# Patient Record
Sex: Male | Born: 1935 | Race: White | Hispanic: No | Smoking: Former smoker
Health system: Southern US, Community
[De-identification: ages and names within clinical notes are randomized; demographics above are authoritative.]

## PROBLEM LIST (undated history)

## (undated) DIAGNOSIS — C6290 Malignant neoplasm of unspecified testis, unspecified whether descended or undescended: Secondary | ICD-10-CM

## (undated) DIAGNOSIS — G039 Meningitis, unspecified: Secondary | ICD-10-CM

## (undated) DIAGNOSIS — J449 Chronic obstructive pulmonary disease, unspecified: Secondary | ICD-10-CM

## (undated) DIAGNOSIS — I6350 Cerebral infarction due to unspecified occlusion or stenosis of unspecified cerebral artery: Secondary | ICD-10-CM

## (undated) DIAGNOSIS — I10 Essential (primary) hypertension: Secondary | ICD-10-CM

## (undated) DIAGNOSIS — E785 Hyperlipidemia, unspecified: Secondary | ICD-10-CM

## (undated) DIAGNOSIS — J45909 Unspecified asthma, uncomplicated: Secondary | ICD-10-CM

## (undated) DIAGNOSIS — N319 Neuromuscular dysfunction of bladder, unspecified: Secondary | ICD-10-CM

## (undated) DIAGNOSIS — M1990 Unspecified osteoarthritis, unspecified site: Secondary | ICD-10-CM

## (undated) DIAGNOSIS — R200 Anesthesia of skin: Secondary | ICD-10-CM

## (undated) DIAGNOSIS — H9190 Unspecified hearing loss, unspecified ear: Secondary | ICD-10-CM

## (undated) DIAGNOSIS — L309 Dermatitis, unspecified: Secondary | ICD-10-CM

## (undated) DIAGNOSIS — M75100 Unspecified rotator cuff tear or rupture of unspecified shoulder, not specified as traumatic: Secondary | ICD-10-CM

## (undated) DIAGNOSIS — H547 Unspecified visual loss: Secondary | ICD-10-CM

## (undated) DIAGNOSIS — E7800 Pure hypercholesterolemia, unspecified: Secondary | ICD-10-CM

## (undated) DIAGNOSIS — F419 Anxiety disorder, unspecified: Secondary | ICD-10-CM

## (undated) DIAGNOSIS — R262 Difficulty in walking, not elsewhere classified: Secondary | ICD-10-CM

## (undated) DIAGNOSIS — C4490 Unspecified malignant neoplasm of skin, unspecified: Secondary | ICD-10-CM

## (undated) DIAGNOSIS — I639 Cerebral infarction, unspecified: Secondary | ICD-10-CM

## (undated) DIAGNOSIS — R2689 Other abnormalities of gait and mobility: Secondary | ICD-10-CM

## (undated) DIAGNOSIS — N400 Enlarged prostate without lower urinary tract symptoms: Secondary | ICD-10-CM

## (undated) DIAGNOSIS — C801 Malignant (primary) neoplasm, unspecified: Secondary | ICD-10-CM

## (undated) HISTORY — DX: Unspecified asthma, uncomplicated: J45.909

## (undated) HISTORY — DX: Malignant neoplasm of unspecified testis, unspecified whether descended or undescended: C62.90

## (undated) HISTORY — DX: Meningitis, unspecified: G03.9

## (undated) HISTORY — PX: OTHER SURGICAL HISTORY: SHX169

## (undated) HISTORY — PX: PROSTATE SURGERY: SHX751

## (undated) HISTORY — DX: Chronic obstructive pulmonary disease, unspecified: J44.9

## (undated) HISTORY — PX: TONSILLECTOMY AND ADENOIDECTOMY: SHX28

## (undated) HISTORY — PX: APPENDECTOMY: SHX54

## (undated) HISTORY — DX: Essential (primary) hypertension: I10

## (undated) HISTORY — DX: Hyperlipidemia, unspecified: E78.5

## (undated) HISTORY — PX: TESTICLE REMOVAL: SHX68

## (undated) HISTORY — DX: Cerebral infarction due to unspecified occlusion or stenosis of unspecified cerebral artery: I63.50

## (undated) HISTORY — PX: COCHLEAR IMPLANT: SHX184

## (undated) HISTORY — PX: EYE SURGERY: SHX253

## (undated) HISTORY — DX: Unspecified hearing loss, unspecified ear: H91.90

## (undated) HISTORY — PX: TONSILLECTOMY: SUR1361

## (undated) HISTORY — DX: Malignant (primary) neoplasm, unspecified: C80.1

## (undated) HISTORY — DX: Pure hypercholesterolemia, unspecified: E78.00

## (undated) HISTORY — PX: COCHLEAR IMPLANT: SUR684

## (undated) HISTORY — DX: Benign prostatic hyperplasia without lower urinary tract symptoms: N40.0

## (undated) HISTORY — DX: Dermatitis, unspecified: L30.9

## (undated) HISTORY — DX: Unspecified malignant neoplasm of skin, unspecified: C44.90

## (undated) HISTORY — DX: Difficulty in walking, not elsewhere classified: R26.2

## (undated) HISTORY — DX: Unspecified visual loss: H54.7

## (undated) HISTORY — DX: Other abnormalities of gait and mobility: R26.89

## (undated) HISTORY — DX: Unspecified rotator cuff tear or rupture of unspecified shoulder, not specified as traumatic: M75.100

---
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
## 2009-03-26 ENCOUNTER — Emergency Department (HOSPITAL_COMMUNITY): Admission: EM | Admit: 2009-03-26 | Discharge: 2009-03-26 | Payer: Self-pay | Admitting: Emergency Medicine

## 2009-10-24 ENCOUNTER — Ambulatory Visit
Admit: 2009-10-24 | Discharge: 2009-10-24 | Disposition: A | Discharge status: Home / Self Care | Payer: Self-pay | Source: Ambulatory Visit | Attending: Urology | Admitting: Urology

## 2009-10-24 LAB — PSA (EFF.4-2010): PSA (eff. 4-2010): 3.01 ng/mL (ref 0.00–4.00)

## 2009-11-12 ENCOUNTER — Encounter: Payer: Self-pay | Admitting: Gastroenterology

## 2009-11-12 ENCOUNTER — Ambulatory Visit: Payer: Self-pay | Admitting: Urology

## 2009-11-12 NOTE — Progress Notes (Signed)
 HPI     This patient was seen today in followup for history of voiding   dysfunction.  He has a history of prostatic hyperplasia with lower urinary   tract symptoms.  He is taking Avodart.  He finds is to continue to be very   helpful and has some modest nocturia.  He denies infections, hematuria,   retention or dysuria.     His interval medical history shows no significant new problems.  He didn't   lose his wife who passed away since last seen but he has social support.     System review negative for chest pain shortness of breath, abdominal pain,   joint swelling.     On exam today he is in no acute distress with vital signs as indicated.  No   wheezing is noted and he has no CVA tenderness.  The abdomen is soft   without evidence of mass or bladder distention.  No hernias present.  Penis   reveals no plaque or discharge.   Rectal exam reveals normal tone and a 2+   benign prostate.     Dipstick urinalysis today normal.           Impression/plan: Continues to do well on Avodart, PSA stable no need for   additional biopsy. I  will renew medication and see back in one year.  Allergies   No Known Drug Allergy.  Current Meds   Benicar HCT 40-25 MG Tablet;; RPT  Serevent Diskus 50 MCG/DOSE Aerosol Powder Breath Activated;; RPT  Lipitor 40 MG Tablet;; RPT  Azithromycin 250 MG Tablet;; RPT  Avodart 0.5 MG Capsule;TAKE 1 CAPSULE DAILY; Rx.  Active Problems   Benign Prostatic Hyperplasia (600.00).  Vital Signs   Recorded by COSIER,BERNADINE on 12 Nov 2009 09:21 AM  BP:138/72,  LUE,  Sitting,   HR: 80 b/min,   Temp: 97.7 F,  Tympanic,   Pain Scale: 0.  Results   U/A DIP (CHEMSTRIP 10 + SSA)   12 Nov 2009 08:27 AM  -   PH,UR: 5   -   LEUK ESTERASE,UR: -  -   NITRITES,UR: -  -   PROTEIN,UR: trace  -   GLUCOSE,UR: -  -   Alphonsus Sias: -  -   BLOOD,UR: -.  Signature   Electronically signed by: Webb Laws  M.D.; 11/12/2009 12:37 PM EST.

## 2010-02-11 NOTE — Miscellaneous (Unsigned)
 Continuity of Care Record  Created: todo  From: Laurette Schimke  From:   From: [employer], EHR v10.2.7.53  To: Foster Simpson  Purpose: Patient Use;       Problems  Diagnosis: Benign Prostatic Hyperplasia (600.00)     Alerts  Allergy - No Known Drug Allergy     Medications  Avodart 0.5 MG Capsule; TAKE 1 CAPSULE DAILY ; Rx   Azithromycin 250 MG Tablet ; RPT   Benicar HCT 40-25 MG Tablet ; RPT   Lipitor 40 MG Tablet ; RPT   Serevent Diskus 50 MCG/DOSE Aerosol Powder Breath Activated ; RPT

## 2010-10-31 ENCOUNTER — Ambulatory Visit
Admit: 2010-10-31 | Discharge: 2010-10-31 | Disposition: A | Discharge status: Home / Self Care | Payer: Self-pay | Source: Ambulatory Visit | Attending: Urology | Admitting: Urology

## 2010-10-31 LAB — PSA (EFF.4-2010): PSA (eff. 4-2010): 2.81 ng/mL (ref 0.00–4.00)

## 2010-11-17 ENCOUNTER — Other Ambulatory Visit: Payer: Self-pay | Admitting: Urology

## 2010-11-18 ENCOUNTER — Ambulatory Visit: Payer: Self-pay | Admitting: Urology

## 2010-11-18 ENCOUNTER — Encounter: Payer: Self-pay | Admitting: Urology

## 2010-11-18 VITALS — BP 124/61 | HR 98 | Temp 96.8°F | Ht 73.0 in | Wt 187.0 lb

## 2010-11-18 DIAGNOSIS — R972 Elevated prostate specific antigen [PSA]: Secondary | ICD-10-CM

## 2010-11-18 DIAGNOSIS — N400 Enlarged prostate without lower urinary tract symptoms: Secondary | ICD-10-CM

## 2010-11-18 DIAGNOSIS — Z1389 Encounter for screening for other disorder: Secondary | ICD-10-CM

## 2010-11-18 LAB — POCT URINALYSIS DIPSTICK
Blood,UA POCT: NEGATIVE
Glucose,UA POCT: NORMAL
Ketones,UA POCT: NEGATIVE
Leuk Esterase,UA POCT: NEGATIVE
Lot #: 21612803
Nitrite,UA POCT: NEGATIVE
PH,UA POCT: 7 (ref 5–8)
Protein,UA POCT: NEGATIVE mg/dL

## 2010-11-18 NOTE — Progress Notes (Signed)
Riley [name] is a 75 y.o. man presenting for continued prostatic surveillance with LUTS and history of elevated PSA.  He has a history of prostatic hyperplasia with lower urinary tract symptoms. He is taking Avodart with good results.  He denies infections, hematuria, retention or dysuria.  He has no new medical problems and has no acute concerns or complaints today. He denies interval infections or blood.    He has no urinary symptoms today.  He voids 2-4 times per day and 0-1 at night.      Results for BIJAN, DAUZAT (MRN [id number]) as of 11/18/2010 09:13   Ref. Range 10/24/2009 11:10 10/31/2010 09:35 11/18/2010 09:08   PSA (eff. 05-2008) Latest Range: 0.00-4.00 ng/mL 3.01 2.81    Glucose,UA POCT Latest Range: Normal    Normal   Ketones,UA POCT Latest Range: Negative    Negative   Specific gravity,UA POCT Latest Range: 1.002-1.03    N/A   Blood,UA POCT Latest Range: Negative    Negative   PH,UA POCT Latest Range: 5-8    7.0   Protein,UA POCT Latest Range: Negative mg/dL   Negative   Nitrite,UA POCT Latest Range: Negative    Negative   Leuk Esterase,UA POCT Latest Range: Negative    Negative     Past Medical History   Diagnosis Date   . Testicle cancer    . COPD (chronic obstructive pulmonary disease)    . Hyperlipidemia      Past Surgical History   Procedure Date   . Appendectomy    . Testicle removal    . Tonsillectomy and adenoidectomy      Current Outpatient Prescriptions on File Prior to Visit   Medication Sig Dispense Refill   . AVODART 0.5 MG capsule TAKE 1 CAPSULE DAILY  30 capsule  11   . BENICAR HCT 40-25 MG per tablet Take  by mouth.  90  0   . SEREVENT DISKUS 50 MCG/DOSE diskus inhaler Inhale  into the lungs.  180  0   . LIPITOR 40 MG tablet Take  by mouth.  45  0   . azithromycin (ZITHROMAX) 250 MG tablet Take  by mouth.  6  0     12 point ROS was negative except.    Filed Vitals:    11/18/10 0911   BP: 124/61   Pulse: 98   Temp: 36 C (96.8 F)   Height: 1.854 m (6\' 1" )   Weight: 84.823 kg (187 lb)      GENERAL APPEARANCE: Appears stated age, well appearing, NAD  HEENT: mucous membrane moist, sclera non icteric  EXTREMITIES: Without clubbing, cyanosis, or edema  NEUROLOGIC: Alert and oriented x3,    GU:   No hernia present  Penis: circumcised without plaques  Meatus: without stricture, discharge  Scrotum without erythema, swelling  Testes right descended without masses, left surgically absent  Cord Structures: without masses   DRE: No rectal masses, normal tone, prostate normal consistency of approximate 30 grams.   both ears benign    UA benign    Plan/Assesment:  Doing well on current therapy.  --Avodart Rx for the next 30 days is needed, samples were given  --RTC in 1 year  --Repeat PSA in 1 year

## 2011-01-07 ENCOUNTER — Other Ambulatory Visit: Payer: Self-pay | Admitting: Urology

## 2011-01-07 MED ORDER — DUTASTERIDE 0.5 MG PO CAPS *I*
0.5000 mg | ORAL_CAPSULE | Freq: Every day | ORAL | Status: DC
Start: 2011-01-07 — End: 2011-12-08

## 2011-10-16 ENCOUNTER — Encounter: Payer: Self-pay | Admitting: Dermatology

## 2011-10-16 ENCOUNTER — Ambulatory Visit: Payer: Self-pay | Admitting: Dermatology

## 2011-10-16 VITALS — BP 133/73 | HR 86 | Temp 97.9°F | Resp 20 | Ht 73.0 in | Wt 187.0 lb

## 2011-10-16 DIAGNOSIS — C439 Malignant melanoma of skin, unspecified: Secondary | ICD-10-CM | POA: Insufficient documentation

## 2011-10-16 NOTE — Patient Instructions (Signed)
Wound care instructions for Incision     The dressing should remain on and dry until Sunday.   If the dressing comes loose before then re-tape it carefully into place.  When the dressing is removed the suture line and edges of the wound will be dark pink or red in color.  This is normal.    WOUND CARE:    Starting Sunday gently wash over the area daily with soap and water.  Pat dry.  Apply Vaseline/Aquaphor over the sutures and cover with Telfa held in place with a band-aid or tape. If the wound becomes increasingly inflamed, warm, painful, drains a pus-like substance or if you develop a fever or chills, please call our office.  Please also call our office if the area has a blistery appearance and itches without relief.    PAIN:      Postoperative pain is usually minimal.  Tylenol or Ibuprofen should relieve any pain you may have.  For the first day or two, apply an ice pack over the area for twenty minutes every two to three hours.  This will relieve swelling, help minimize bruising and lessen pain.  If severe pain is not relieved with Tylenol/Ibuprofen please call the office.      BLEEDING:     Careful attention has been given to your wound to prevent bleeding.  The dressing you have on is a pressure dressing to help prevent bleeding.       A small amount of blood on the dressing is normal.  If the bleeding seems persistent and saturates the dressing, apply firm, steady pressure over the dressing with gauze for fifteen minutes. Tape that gauze in place.  This is usually adequate treatment.  You may also apply an ice pack over the area.  If bleeding continues call our office.    No bending/heavy lifting for one week.  Relax and recline the first day or two.      QUESTIONS/CONCERNS:   If you have any questions/concerns please don’t hesitate to call our office at [phone]. You may call the same number for after hour or weekend emergencies (severe pain, excessive bleeding/swelling) and the Dermatology resident  on call will contact you.

## 2011-10-17 ENCOUNTER — Telehealth: Payer: Self-pay | Admitting: Specialist

## 2011-10-17 NOTE — Telephone Encounter (Signed)
Called pt for 24 hour surgical post-op follow-up. Unable to reach to patient so left message advising him to give [place] a call back if there are any issues/concerns

## 2011-10-20 LAB — SURGICAL PATHOLOGY

## 2011-10-22 ENCOUNTER — Telehealth: Payer: Self-pay

## 2011-10-22 NOTE — Progress Notes (Signed)
The patient has a biopsy proven malignant melanoma 0.5 mm Breslow depth on his left upper arm.  It was noticed by his daughter when she was visiting.  There were no other symptoms associated with it.  There is no previous history of any melanomas.  The preoperative tumor size, based on the pathology report, was approximately 1.8 cm in size.  There is no mitotic activity and no ulceration.      Due to its being Stage IA, recommendations were for a wide local excision with a 1 cm margin.  No sentinel lymph node biopsy was recommended.  Imaging studies were not required.  The patient's excellent prognosis was explained but I did make the point that it is not a 100% cure rate and there is a small risk for recurrence and metastatic spread.  The melanoma was excised today without complications with a 1 cm margin to the level of the fascia and closed primarily.  Please see operative note for details.  The patient plans on following up with Dr. Tillman Sers every six months for a full skin check and lymph node examination.        Riley [name]. Manson Passey, M.D.

## 2011-10-22 NOTE — Progress Notes (Signed)
PREOPERATIVE DIAGNOSIS: Melanoma  POSTOPERATIVE DIAGNOSIS: Same  OPERATION:    Fusiform excision with layered closure    Indications:  The patient has a biopsy proven melanoma, Breslow depth 0.5 mm, located on the left upper arm,.  Preoperative size:1.8 cm (scar).  Excision is recommended.  The procedure was explained to the patient.  After discussion of the risks of bleeding, scarring, infection, recurrence and reaction to anesthetic, written informed consent was obtained and the patient underwent the following procedure.    Procedure:  The site of the skin cancer was identified concurrently by both the patient and Dr. Manson Passey and marked with a surgical pen; the margins of the excision were delineated with the marking pen.  The patient was taken to the operative suite and placed on the operating room table.  1% Lidocaine with epinephrine was used for local anesthesia.  The area was prepped and draped in a sterile fashion.  Incision lines were placed in a manner to facilitate closure in the natural skin tension lines.  A fusiform excision was performed with a 1  cm margin.  Dissection was carried sharply through the dermis to the level of the fascia.  Specimen sent for histologic confirmation.  Meticulous hemostasis was maintained throughout the procedure.  Deep closure was achieved with the subcutaneous (including subcutaneous fascia) and dermal placement of buried absorbable 3-0 Monocryl sutures. The epidermis was meticulously approximated with running mattress 5-0 nylon sutures.  Final wound size: 6.  A sterile pressure dressing was applied and wound care instructions were given.  The patient was discharged ambulatory and with stable vital signs.  RV one week for SR.     Complications:  None.

## 2011-10-23 ENCOUNTER — Ambulatory Visit: Payer: Self-pay | Admitting: Dermatology

## 2011-10-23 ENCOUNTER — Encounter: Payer: Self-pay | Admitting: Dermatology

## 2011-10-23 VITALS — BP 138/75 | HR 86 | Ht 73.0 in | Wt 187.0 lb

## 2011-10-23 DIAGNOSIS — C439 Malignant melanoma of skin, unspecified: Secondary | ICD-10-CM

## 2011-10-23 NOTE — Telephone Encounter (Signed)
Entry made in error

## 2011-10-23 NOTE — Progress Notes (Signed)
Post-op Check    No pain or bleeding. Surgical site L arm  healing well.  No infection, hematoma or dehiscence.  Wound edges well approximated.      Sutures removed, steristrips applied.  Wound care instructions reinforced. Path: no tumor    Follow-up in 6-8 weeks unless patient pleased with healing at that time.  Long-term follow-up with primary dermatologist for skin checks.

## 2011-12-08 ENCOUNTER — Ambulatory Visit: Payer: Self-pay | Admitting: Urology

## 2011-12-08 ENCOUNTER — Encounter: Payer: Self-pay | Admitting: Urology

## 2011-12-08 VITALS — BP 141/69 | HR 97 | Ht 73.0 in | Wt 188.0 lb

## 2011-12-08 DIAGNOSIS — Z1389 Encounter for screening for other disorder: Secondary | ICD-10-CM

## 2011-12-08 DIAGNOSIS — N400 Enlarged prostate without lower urinary tract symptoms: Secondary | ICD-10-CM

## 2011-12-08 DIAGNOSIS — R972 Elevated prostate specific antigen [PSA]: Secondary | ICD-10-CM

## 2011-12-08 LAB — POCT URINALYSIS DIPSTICK
Blood,UA POCT: NEGATIVE
Glucose,UA POCT: NORMAL
Ketones,UA POCT: NEGATIVE
Leuk Esterase,UA POCT: NEGATIVE
Lot #: 21913101
Nitrite,UA POCT: NEGATIVE
PH,UA POCT: 7 (ref 5–8)
Protein,UA POCT: NEGATIVE mg/dL

## 2011-12-08 MED ORDER — DUTASTERIDE 0.5 MG PO CAPS *I*
0.5000 mg | ORAL_CAPSULE | Freq: Every day | ORAL | Status: DC
Start: 2011-12-08 — End: 2014-07-24

## 2011-12-08 NOTE — Progress Notes (Signed)
Riley [name] was seen today for evaluation of voiding dysfunction.  Since last seen symptoms have is unchanged. The primary voiding complaint(s) are sudden urge to void..Voiding is estimated at 4-5 times during the day and about 0-1 times at night.      Current therapy  Behavioral:    Pharmacologic: tamsulosin and avodart --     He currently has not accidents or loss of urine.  He has not changed his diet or exercise habits since last seen.  Since last seen he has not had any UTIs and has not blood in their urine.      Limited system review was performed for symptoms of illnessess that might affect voiding or symptoms of possible side effects of medications used for voiding/sexual dysfunction.    Constitutional: query for weight loss, poor appetite,fatigue,night sweats: negative for all  CVS: query for chest pain,shortness of breath,palpitations,leg edema: negative for all  GI: query for abdominal pain, constipation, diarrhea, fecal incontinence: negative for all  Neuro: query for weakness/numbness of extremities,dizziness, headaches: negative for all  HEENT: query for dry mouth, sinus congestion, blurred vision: negative for all  MUS-SK: query for back pain, bone pain, joint swelling:  negative for all    Interval hx notable for removal of melanoma and rotator cuff tear.  BP 141/69  Pulse 97  Ht 1.854 m (6\' 1" )  Wt 85.276 kg (188 lb)  BMI 24.81 kg/m2    GENERAL APPEARANCE: Appears stated age, well appearing, NAD  HEENT: mucous membrane moist, sclera non icteric  ABDOMEN:  soft, non-tender, without hepato-splenomegaly, with no evidence of bladder distension.  EXTREMITIES: Without clubbing, cyanosis, or edema  NEUROLOGIC: Alert and oriented x3,    GU:   No hernia present  Penis: circumcised without plaques  Meatus: without stricture, discharge  Scrotum without erythema, swelling  Testes bilaterally descended without masses  Cord Structures: without masses   DRE: No rectal masses, normal tone, prostate normal  consistency of approximate 30 grams.      UA benign     Impression/plan: stable BPH, elects not to continue PSA screening. Will renew Avodart. May follow up with PCP and return to our practice PRN.

## 2013-06-06 DIAGNOSIS — M25519 Pain in unspecified shoulder: Secondary | ICD-10-CM | POA: Insufficient documentation

## 2013-06-06 DIAGNOSIS — M25579 Pain in unspecified ankle and joints of unspecified foot: Secondary | ICD-10-CM | POA: Insufficient documentation

## 2013-06-06 DIAGNOSIS — M19079 Primary osteoarthritis, unspecified ankle and foot: Secondary | ICD-10-CM | POA: Insufficient documentation

## 2013-06-06 DIAGNOSIS — S43429A Sprain of unspecified rotator cuff capsule, initial encounter: Secondary | ICD-10-CM | POA: Insufficient documentation

## 2013-06-08 DIAGNOSIS — G039 Meningitis, unspecified: Secondary | ICD-10-CM | POA: Insufficient documentation

## 2013-06-08 DIAGNOSIS — R739 Hyperglycemia, unspecified: Secondary | ICD-10-CM | POA: Insufficient documentation

## 2013-06-10 DIAGNOSIS — R7881 Bacteremia: Secondary | ICD-10-CM | POA: Insufficient documentation

## 2013-06-12 DIAGNOSIS — G939 Disorder of brain, unspecified: Secondary | ICD-10-CM | POA: Insufficient documentation

## 2013-07-18 DIAGNOSIS — G723 Periodic paralysis: Secondary | ICD-10-CM | POA: Insufficient documentation

## 2013-07-18 DIAGNOSIS — L89309 Pressure ulcer of unspecified buttock, unspecified stage: Secondary | ICD-10-CM | POA: Insufficient documentation

## 2013-07-24 DIAGNOSIS — H547 Unspecified visual loss: Secondary | ICD-10-CM | POA: Insufficient documentation

## 2013-07-24 DIAGNOSIS — H4720 Unspecified optic atrophy: Secondary | ICD-10-CM | POA: Insufficient documentation

## 2013-07-24 DIAGNOSIS — H2510 Age-related nuclear cataract, unspecified eye: Secondary | ICD-10-CM | POA: Insufficient documentation

## 2013-09-05 DIAGNOSIS — H903 Sensorineural hearing loss, bilateral: Secondary | ICD-10-CM | POA: Insufficient documentation

## 2013-11-14 DIAGNOSIS — Z4889 Encounter for other specified surgical aftercare: Secondary | ICD-10-CM | POA: Insufficient documentation

## 2014-01-10 ENCOUNTER — Ambulatory Visit: Payer: Self-pay | Admitting: Internal Medicine

## 2014-02-08 ENCOUNTER — Ambulatory Visit: Payer: Self-pay | Admitting: Nurse Practitioner

## 2014-02-09 ENCOUNTER — Ambulatory Visit (INDEPENDENT_AMBULATORY_CARE_PROVIDER_SITE_OTHER): Payer: Medicare HMO | Admitting: Internal Medicine

## 2014-02-09 ENCOUNTER — Encounter: Payer: Self-pay | Admitting: Internal Medicine

## 2014-02-09 ENCOUNTER — Other Ambulatory Visit: Payer: Self-pay | Admitting: Internal Medicine

## 2014-02-09 ENCOUNTER — Telehealth: Payer: Self-pay | Admitting: *Deleted

## 2014-02-09 VITALS — BP 134/76 | HR 86 | Temp 97.4°F | Resp 20 | Ht 73.0 in | Wt 173.0 lb

## 2014-02-09 DIAGNOSIS — N644 Mastodynia: Secondary | ICD-10-CM

## 2014-02-09 DIAGNOSIS — C439 Malignant melanoma of skin, unspecified: Secondary | ICD-10-CM

## 2014-02-09 DIAGNOSIS — E782 Mixed hyperlipidemia: Secondary | ICD-10-CM | POA: Insufficient documentation

## 2014-02-09 DIAGNOSIS — N390 Urinary tract infection, site not specified: Secondary | ICD-10-CM

## 2014-02-09 DIAGNOSIS — N400 Enlarged prostate without lower urinary tract symptoms: Secondary | ICD-10-CM

## 2014-02-09 DIAGNOSIS — J449 Chronic obstructive pulmonary disease, unspecified: Secondary | ICD-10-CM

## 2014-02-09 DIAGNOSIS — E785 Hyperlipidemia, unspecified: Secondary | ICD-10-CM

## 2014-02-09 DIAGNOSIS — N4889 Other specified disorders of penis: Secondary | ICD-10-CM | POA: Insufficient documentation

## 2014-02-09 DIAGNOSIS — K219 Gastro-esophageal reflux disease without esophagitis: Secondary | ICD-10-CM | POA: Insufficient documentation

## 2014-02-09 DIAGNOSIS — I10 Essential (primary) hypertension: Secondary | ICD-10-CM | POA: Insufficient documentation

## 2014-02-09 MED ORDER — SALMETEROL XINAFOATE 50 MCG/DOSE IN AEPB: 1.0000 | INHALATION_SPRAY | Freq: Two times a day (BID) | RESPIRATORY_TRACT | Status: DC

## 2014-02-09 MED ORDER — LOSARTAN POTASSIUM 50 MG PO TABS: 50.0000 mg | ORAL_TABLET | Freq: Every day | ORAL | Status: DC

## 2014-02-09 MED ORDER — PANTOPRAZOLE SODIUM 40 MG PO TBEC: 40.0000 mg | DELAYED_RELEASE_TABLET | Freq: Every day | ORAL | Status: DC

## 2014-02-09 MED ORDER — ATORVASTATIN CALCIUM 20 MG PO TABS: 20.0000 mg | ORAL_TABLET | Freq: Every day | ORAL | Status: DC

## 2014-02-09 MED ORDER — TAMSULOSIN HCL 0.4 MG PO CAPS: 0.4000 mg | ORAL_CAPSULE | Freq: Every day | ORAL | Status: DC

## 2014-02-09 MED ORDER — FINASTERIDE 5 MG PO TABS: 5.0000 mg | ORAL_TABLET | Freq: Every day | ORAL | Status: DC

## 2014-02-09 NOTE — Telephone Encounter (Signed)
Ricardo [name], daughter called and stated that the Urologist they want to go to is Dr. Kreg Shropshire in [place] ([address] # [phone]. Does a referral order need to be placed? Please advise.

## 2014-02-09 NOTE — Telephone Encounter (Signed)
Yes I placed an order for urology during the encounter. Please update it to include family wishes. Thank yoiu

## 2014-02-09 NOTE — Patient Instructions (Signed)
F/u as above

## 2014-02-09 NOTE — Progress Notes (Signed)
Patient ID: Ricardo [name], male   DOB: [birth year], 79 y.o.   MRN: [id number]    Facility  [facility]    Place of Service:   OFFICE   Allergies  Allergen Reactions  . Peanut Oil Shortness Of Breath and Swelling    Itching    Chief Complaint  Patient presents with  . 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 His main concern is getting a urology referral. Now has a chronic foley cath due to complications following meningitis.  He has penile discomfort. Currently being treated for UTI with cephalexin. He reports at least 8 UTI's in last 8 mos. He takes trimethiprim daily for prevention. Foley changed monthly by [place].  He also c/o right nipple sensitivity but no d/c or lump noted. Sx's present since Thanksgiving 2015 and has worsened x 2 weeks.  He rec'd home care aide through [employer]  He needs handicap placard completed. He is legally blind in [place]  Medications: Patient's Medications  New Prescriptions   No medications on file  Previous Medications   ACETAMINOPHEN (TYLENOL) 500 MG TABLET    2 tab tid for pain, DO NOT EXCEED 3000mg  PER 24hrs   ASPIRIN 81 MG CHEWABLE TABLET    Chew 81 mg by mouth.   ATORVASTATIN (LIPITOR) 20 MG TABLET    Take by mouth.   CEPHALEXIN (KEFLEX) 500 MG CAPSULE    Take 500 mg by mouth.   CHOLECALCIFEROL ([employer] D3) 5000 UNITS CAPSULE    Take 5,000 Units by mouth daily.   FINASTERIDE (PROSCAR) 5 MG TABLET    Take by mouth.   LACTOBACILLUS (ACIDOPHILUS) 100 MG CAPS    Take 100 mg by mouth daily.   LOSARTAN (COZAAR) 50 MG TABLET    Take by mouth.   MELATONIN 3 MG CAPS    Take 1 capsule by mouth at bedtime.    MULTIPLE VITAMINS-MINERALS (MULTIVITAMIN WITH MINERALS) TABLET    Take 1  tablet by mouth daily.   PANTOPRAZOLE (PROTONIX) 40 MG TABLET    Take by mouth.   SALMETEROL (SEREVENT) 50 MCG/DOSE DISKUS INHALER    Inhale 1 puff into the lungs.   TAMSULOSIN (FLOMAX) 0.4 MG CAPS CAPSULE    Take by mouth.   TRIMETHOPRIM (TRIMPEX) 100 MG TABLET    Take by mouth.  Modified Medications   No medications on file  Discontinued Medications   No medications on file     Review of Systems   As above. All other systems reviewed are negative   Filed Vitals:   02/09/14 1050  BP: 134/76  Pulse: 86  Temp: 97.4 F (36.3 C)  TempSrc: Oral  Resp: 20  Height: 6\' 1"  (1.854 m)  Weight: 173 lb (78.472 kg)  SpO2: 95%   Body mass index is 22.83 kg/(m^2).  Physical Exam  CONSTITUTIONAL: Looks frail in NAD. Awake, alert and oriented x 3. Daughter present. Pt sitting in w/c HEENT: PERRLA. No scleral icterus. Oropharynx clear and without exudate. MMM. Left cochlear implant intact  NECK: Supple. Nontender. No palpable cervical or supraclavicular lymph nodes. No carotid bruit b/l. No thyromegaly or thyroid mass palpable.  CVS: Regular rate with  1/6 SEmurmur. No gallop  or rub. LUNGS: CTA b/l no wheezing, rales or rhonchi. ABDOMEN: Bowel sounds present. Soft, nontender, nondistended. No palpable mass or bruit EXTREMITIES: No edema b/l. Distal pulses palpable. No calf tenderness. NEURO: left sided weakness with min contracture of LUE PSYCH: Affect, behavior and mood normal GU: foley cath with clear yellow urine DTG  BREASTS: right swelling with TTP lump at 2-3 o'clock position. No nipple d/c. No skin dimpling. Left without palpable mass, nipple d/c or skin dimpling  Labs reviewed: No results found for any previous visit.   Assessment/Plan   ICD-9-CM ICD-10-CM   1. Breast pain, right 611.71 N64.4 MM Digital Diagnostic Bilat     CANCELED: US BREAST COMPLETE UNI RIGHT INC AXILLA  2. Penile pain 607.9 N48.89 Ambulatory referral to Urology  3. Recurrent UTI 599.0 N39.0 Ambulatory  referral to Urology     CBC with Differential  4. Essential hypertension 401.9 I10 CMP  5. Hyperlipidemia 272.4 E78.5 Lipid Panel  6. Gastroesophageal reflux disease without esophagitis 530.81 K21.9   7. BPH (benign prostatic hypertrophy) 600.00 N40.0 Ambulatory referral to Urology  8. Chronic obstructive pulmonary disease, unspecified COPD, unspecified chronic bronchitis type 496 J44.9   9. Melanoma of skin 172.9 C43.9 Ambulatory referral to Dermatology    - continue current medications as Rx  - handicap placard application completed  - f/u with specialists  - RTO in 3 mos or sooner if need be  - get old records from [place]. [name]  [facility] [address] [place], [place] [address] [phone] Office (Wednesdays and Fridays 8 AM - 5 PM) [phone] Cell (Monday-Friday 8 AM - 5 PM)

## 2014-02-13 NOTE — Telephone Encounter (Signed)
Patient daughter Notified and sent message to Earlie Server for [employer]

## 2014-02-19 ENCOUNTER — Ambulatory Visit
Admission: RE | Admit: 2014-02-19 | Discharge: 2014-02-19 | Disposition: A | Discharge status: Home / Self Care | Payer: Medicare HMO | Source: Ambulatory Visit | Attending: Internal Medicine | Admitting: Internal Medicine

## 2014-02-19 DIAGNOSIS — N644 Mastodynia: Secondary | ICD-10-CM

## 2014-02-20 ENCOUNTER — Other Ambulatory Visit: Payer: Self-pay | Admitting: *Deleted

## 2014-02-20 ENCOUNTER — Telehealth: Payer: Self-pay | Admitting: *Deleted

## 2014-02-20 ENCOUNTER — Telehealth: Payer: Self-pay | Admitting: Internal Medicine

## 2014-02-20 ENCOUNTER — Other Ambulatory Visit: Payer: Medicare HMO

## 2014-02-20 DIAGNOSIS — N390 Urinary tract infection, site not specified: Secondary | ICD-10-CM

## 2014-02-20 DIAGNOSIS — T83511A Infection and inflammatory reaction due to indwelling urethral catheter, initial encounter: Principal | ICD-10-CM

## 2014-02-20 LAB — POCT URINALYSIS DIPSTICK
Bilirubin, UA: NEGATIVE
GLUCOSE UA: NEGATIVE
Ketones, UA: NEGATIVE
Leukocytes, UA: NEGATIVE
Nitrite, UA: NEGATIVE
Protein, UA: NEGATIVE
Urobilinogen, UA: NEGATIVE
pH, UA: 6.5

## 2014-02-20 NOTE — Telephone Encounter (Signed)
Daughter called and stated that she thought that her father may have had a UTI, and would like to bring a urine sample in to be checked. I informed her that it would be okay to do so, she stated that she would be here in 45 minutes.

## 2014-02-20 NOTE — Telephone Encounter (Signed)
-----   Message from Eilene Ghazi, RMA sent at 02/20/2014  3:28 PM EST ----- Regarding: urine dipstick Dr. Eulas Post, the results of the urine dipstick were negative.

## 2014-02-20 NOTE — Telephone Encounter (Signed)
Tell pt UA negative for UTI

## 2014-02-21 ENCOUNTER — Other Ambulatory Visit: Payer: Self-pay | Admitting: *Deleted

## 2014-02-21 MED ORDER — ASPIRIN 81 MG PO CHEW: CHEWABLE_TABLET | ORAL | Status: DC

## 2014-02-21 MED ORDER — PANTOPRAZOLE SODIUM 40 MG PO TBEC: DELAYED_RELEASE_TABLET | ORAL | Status: DC

## 2014-02-21 MED ORDER — FINASTERIDE 5 MG PO TABS: 5.0000 mg | ORAL_TABLET | Freq: Every day | ORAL | Status: DC

## 2014-02-21 MED ORDER — TRIMETHOPRIM 100 MG PO TABS: ORAL_TABLET | ORAL | Status: DC

## 2014-02-21 MED ORDER — TAMSULOSIN HCL 0.4 MG PO CAPS: ORAL_CAPSULE | ORAL | Status: DC

## 2014-02-21 NOTE — Telephone Encounter (Signed)
Ricardo [name], daughter called and requested Rx Refills.

## 2014-02-21 NOTE — Telephone Encounter (Signed)
Called patient's daughter [name]) to inform her that the UA result was negative. She stated that she understood the result and that it looked like he had a urinary infection due to the urine being so cloudy. But she was glad that he didn't have one.

## 2014-03-05 HISTORY — PX: CATARACT EXTRACTION: SUR2

## 2014-03-08 ENCOUNTER — Encounter: Payer: Self-pay | Admitting: Internal Medicine

## 2014-03-08 ENCOUNTER — Telehealth: Payer: Self-pay | Admitting: *Deleted

## 2014-03-08 ENCOUNTER — Ambulatory Visit (INDEPENDENT_AMBULATORY_CARE_PROVIDER_SITE_OTHER): Payer: Medicare HMO | Admitting: Internal Medicine

## 2014-03-08 VITALS — BP 118/60 | HR 85 | Temp 98.0°F | Resp 18 | Ht 73.0 in | Wt 173.0 lb

## 2014-03-08 DIAGNOSIS — R8299 Other abnormal findings in urine: Secondary | ICD-10-CM

## 2014-03-08 DIAGNOSIS — Z960 Presence of urogenital implants: Secondary | ICD-10-CM

## 2014-03-08 DIAGNOSIS — N319 Neuromuscular dysfunction of bladder, unspecified: Secondary | ICD-10-CM | POA: Insufficient documentation

## 2014-03-08 DIAGNOSIS — Z978 Presence of other specified devices: Secondary | ICD-10-CM | POA: Insufficient documentation

## 2014-03-08 DIAGNOSIS — N390 Urinary tract infection, site not specified: Secondary | ICD-10-CM

## 2014-03-08 DIAGNOSIS — Z9889 Other specified postprocedural states: Secondary | ICD-10-CM

## 2014-03-08 DIAGNOSIS — R8290 Unspecified abnormal findings in urine: Secondary | ICD-10-CM

## 2014-03-08 NOTE — Telephone Encounter (Signed)
Ricardo [name] with [facility] called and left message wanting home health orders from today's visit with Dr. Mariea Clonts. I spoke with Dr. Mariea Clonts and she stated that she gave written orders to the daughter to change patient's cath and then obtain U/A and Culture. I called and spoke with Margreta Journey at [facility] and she stated that she will send a nurse out and obtain the order and change and collect the urine.

## 2014-03-08 NOTE — Progress Notes (Signed)
Patient ID: Ricardo [name], male   DOB: Mar 02, 1935, 79 y.o.   MRN: [id number]   Location:  [facility]   Allergies  Allergen Reactions  . Peanut Oil Shortness Of Breath and Swelling    Itching    Chief Complaint  Patient presents with  . Acute Visit    UTI    HPI: Patient is a 79 y.o. white male seen in the office today for an acute visit due to his daughter's concerns that he may have a UTI.  She is absolutely certain he has one and that he did last time even when the urinalysis results were normal.    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  HENT: Positive for hearing loss.        Deaf right ear, cochlear implant left   Eyes:       Wears glasses  Gastrointestinal: Negative for abdominal pain.  Genitourinary: Negative for dysuria, urgency, frequency, hematuria and flank pain.       Has penile erosion with pain; has foley catheter with cloudy pale yellow urine with sediment in catheter     Past Medical History  Diagnosis Date  . Rotator cuff tear     right  . Cancer 1956    left testicle    Past Surgical History  Procedure Laterality Date  . Appendectomy    . Prostate surgery      biopsy x 2  . Tonsillectomy      childhood  . Testicle removal Left       October 1956    Social History:   reports that he quit smoking about 49 years ago. His smoking use included Cigarettes. He started smoking about 59 years ago. He quit after 10 years of use. He has never used smokeless tobacco. His alcohol and drug histories are not on file.  No family history on file.  Medications: Patient's Medications  New Prescriptions   No medications on file  Previous Medications   ACETAMINOPHEN (TYLENOL) 500 MG TABLET    2 tab tid for pain, DO NOT EXCEED 3000mg  PER 24hrs   ASPIRIN 81 MG CHEWABLE TABLET    Take one tablet by mouth once daily   ATORVASTATIN (LIPITOR) 20 MG TABLET    Take 1 tablet (20 mg total) by mouth daily at 6 PM.   CHOLECALCIFEROL (CVS D3) 5000 UNITS CAPSULE    Take 5,000 Units by mouth daily.   FINASTERIDE (PROSCAR) 5 MG TABLET    Take 1 tablet (5 mg total) by mouth daily.   LACTOBACILLUS (ACIDOPHILUS) 100 MG CAPS    Take 100 mg by mouth daily.   LOSARTAN (COZAAR) 50 MG TABLET    Take 1 tablet (50 mg total) by mouth daily.   MELATONIN 3 MG CAPS    Take  1 capsule by mouth at bedtime.    MULTIPLE VITAMINS-MINERALS (MULTIVITAMIN WITH MINERALS) TABLET    Take 1 tablet by mouth daily.   PANTOPRAZOLE (PROTONIX) 40 MG TABLET    Take one tablet by mouth once daily for stomach   SALMETEROL (SEREVENT) 50 MCG/DOSE DISKUS INHALER    Inhale 1 puff into the lungs 2 (two) times daily.   TAMSULOSIN (FLOMAX) 0.4 MG CAPS CAPSULE    Take one tablet by mouth once daily for prostate   TRIMETHOPRIM (TRIMPEX) 100 MG TABLET    Take one tablet by mouth once daily  Modified Medications   No medications on file  Discontinued Medications   No medications on file     Physical Exam: Filed Vitals:   03/08/14 1126  BP: 118/60  Pulse: 85  Temp: 98 F (36.7 C)  TempSrc: Oral  Resp: 18  Height: 6\' 1"  (1.854 m)  Weight: 173 lb (78.472 kg)  SpO2: 97%  Physical Exam  Constitutional: No distress.  HENT:  Cochlear implant left  Eyes:  glasses  Cardiovascular:  Normal rate, regular rhythm and normal heart sounds.   Pulmonary/Chest: Effort normal and breath sounds normal.  Abdominal: Soft. Bowel sounds are normal. He exhibits no distension and no mass. There is no tenderness.  No suprapubic tenderness  Musculoskeletal:  In wheelchair today  Neurological: He is alert.  Does not seem confused  Skin: Skin is warm and dry.    Labs reviewed: None of the urine results are actually in this system aside from the negative UA at our office  Assessment/Plan 1. Cloudy urine -not diagnostic criterion for a UTI and tried to educate his daughter about this and what to look for for true infection, but did not make any progress -she has already given him 4 days of cipro so any urine sample will be unreliable -he is almost certainly colonized with his indwelling foley  2. Recurrent UTI -unclear if he truly has one considering treatment was already given and he has none of the McGeer criteria  -will have home health change catheter and then obtain UA c+s, but it will not be entirely reliable given he aready had 4 days of cipro  3. Neurogenic bladder -sounds like he developed this due to brain damage from his meningitis that also resulted in his hearing loss  4. Foley catheter in place -due to #4  Labs/tests ordered:  Change catheter and UA c+s Next appt:  Keep regular appt with Dr. Eulas Post and with urology  Ruthmary Occhipinti L. Lenae Wherley, D.O. [facility] [address], [place] [address] Cell Phone (Mon-Fri 8am-5pm):  [phone] On Call:  [phone] & follow prompts after 5pm & weekends Office Phone:  [phone] Office Fax:  [phone]

## 2014-03-13 ENCOUNTER — Telehealth: Payer: Self-pay | Admitting: *Deleted

## 2014-03-13 NOTE — Telephone Encounter (Signed)
Patient daughter, Ricardo [name] is calling wanting the results from the U/A and wants to know what to do. Patient seems to feel better but urine is extremely cloudy and odor. Please Advise.

## 2014-03-13 NOTE — Telephone Encounter (Signed)
I received only the urine culture yesterday, NO UA.  I then responded asking for the UA result to go with the culture.  Perhaps it arrived today in my file, but it was not there at [facility] yesterday.

## 2014-03-14 MED ORDER — AMOXICILLIN-POT CLAVULANATE 875-125 MG PO TABS: ORAL_TABLET | ORAL | Status: DC

## 2014-03-14 NOTE — Telephone Encounter (Signed)
Received U/A and Culture and given to Dr. Eulas Post to sign off. Per Dr. Glean Salen Augmentin 875mg  One tablet by mouth twice daily for infection No RF. #20.  [name] Notified and faxed Rx to pharmacy.

## 2014-03-14 NOTE — Telephone Encounter (Signed)
Spoke with [name] and she is going to also ask the homehealth nurse to send results.

## 2014-03-21 DIAGNOSIS — Z466 Encounter for fitting and adjustment of urinary device: Secondary | ICD-10-CM | POA: Diagnosis not present

## 2014-03-21 DIAGNOSIS — R339 Retention of urine, unspecified: Secondary | ICD-10-CM | POA: Diagnosis not present

## 2014-03-21 DIAGNOSIS — I10 Essential (primary) hypertension: Secondary | ICD-10-CM | POA: Diagnosis not present

## 2014-03-21 DIAGNOSIS — Z7982 Long term (current) use of aspirin: Secondary | ICD-10-CM | POA: Diagnosis not present

## 2014-03-21 DIAGNOSIS — N390 Urinary tract infection, site not specified: Secondary | ICD-10-CM | POA: Diagnosis not present

## 2014-03-21 DIAGNOSIS — J449 Chronic obstructive pulmonary disease, unspecified: Secondary | ICD-10-CM | POA: Diagnosis not present

## 2014-03-22 ENCOUNTER — Encounter: Payer: Self-pay | Admitting: Internal Medicine

## 2014-04-04 ENCOUNTER — Encounter: Payer: Self-pay | Admitting: Internal Medicine

## 2014-04-10 ENCOUNTER — Other Ambulatory Visit: Payer: Self-pay | Admitting: Urology

## 2014-04-18 ENCOUNTER — Other Ambulatory Visit: Payer: Self-pay | Admitting: Urology

## 2014-04-19 MED ORDER — CHLORHEXIDINE GLUCONATE 4 % EX LIQD: Freq: Once | CUTANEOUS | Status: DC

## 2014-04-21 ENCOUNTER — Encounter (HOSPITAL_COMMUNITY): Payer: Self-pay | Admitting: Emergency Medicine

## 2014-04-21 ENCOUNTER — Emergency Department (HOSPITAL_COMMUNITY): Payer: Medicare HMO

## 2014-04-21 ENCOUNTER — Inpatient Hospital Stay (HOSPITAL_COMMUNITY)
Admission: EM | Admit: 2014-04-21 | Discharge: 2014-04-24 | DRG: 698 | Disposition: A | Discharge status: Home / Self Care | Payer: Medicare HMO | Attending: Internal Medicine | Admitting: Internal Medicine

## 2014-04-21 DIAGNOSIS — Z87440 Personal history of urinary (tract) infections: Secondary | ICD-10-CM

## 2014-04-21 DIAGNOSIS — N319 Neuromuscular dysfunction of bladder, unspecified: Secondary | ICD-10-CM | POA: Diagnosis present

## 2014-04-21 DIAGNOSIS — Z8249 Family history of ischemic heart disease and other diseases of the circulatory system: Secondary | ICD-10-CM

## 2014-04-21 DIAGNOSIS — Z85828 Personal history of other malignant neoplasm of skin: Secondary | ICD-10-CM

## 2014-04-21 DIAGNOSIS — R740 Nonspecific elevation of levels of transaminase and lactic acid dehydrogenase [LDH]: Secondary | ICD-10-CM

## 2014-04-21 DIAGNOSIS — N210 Calculus in bladder: Secondary | ICD-10-CM | POA: Diagnosis present

## 2014-04-21 DIAGNOSIS — Z7982 Long term (current) use of aspirin: Secondary | ICD-10-CM | POA: Diagnosis not present

## 2014-04-21 DIAGNOSIS — R109 Unspecified abdominal pain: Secondary | ICD-10-CM

## 2014-04-21 DIAGNOSIS — E785 Hyperlipidemia, unspecified: Secondary | ICD-10-CM | POA: Diagnosis present

## 2014-04-21 DIAGNOSIS — E869 Volume depletion, unspecified: Secondary | ICD-10-CM | POA: Diagnosis present

## 2014-04-21 DIAGNOSIS — R7401 Elevation of levels of liver transaminase levels: Secondary | ICD-10-CM | POA: Insufficient documentation

## 2014-04-21 DIAGNOSIS — T8351XA Infection and inflammatory reaction due to indwelling urinary catheter, initial encounter: Secondary | ICD-10-CM | POA: Diagnosis not present

## 2014-04-21 DIAGNOSIS — A419 Sepsis, unspecified organism: Secondary | ICD-10-CM | POA: Diagnosis present

## 2014-04-21 DIAGNOSIS — Z87891 Personal history of nicotine dependence: Secondary | ICD-10-CM | POA: Diagnosis not present

## 2014-04-21 DIAGNOSIS — Z823 Family history of stroke: Secondary | ICD-10-CM | POA: Diagnosis not present

## 2014-04-21 DIAGNOSIS — N400 Enlarged prostate without lower urinary tract symptoms: Secondary | ICD-10-CM | POA: Diagnosis present

## 2014-04-21 DIAGNOSIS — Y846 Urinary catheterization as the cause of abnormal reaction of the patient, or of later complication, without mention of misadventure at the time of the procedure: Secondary | ICD-10-CM | POA: Diagnosis present

## 2014-04-21 DIAGNOSIS — IMO0001 Reserved for inherently not codable concepts without codable children: Secondary | ICD-10-CM | POA: Insufficient documentation

## 2014-04-21 DIAGNOSIS — T8351XS Infection and inflammatory reaction due to indwelling urinary catheter, sequela: Secondary | ICD-10-CM | POA: Diagnosis not present

## 2014-04-21 DIAGNOSIS — E871 Hypo-osmolality and hyponatremia: Secondary | ICD-10-CM | POA: Diagnosis present

## 2014-04-21 DIAGNOSIS — H9190 Unspecified hearing loss, unspecified ear: Secondary | ICD-10-CM | POA: Diagnosis present

## 2014-04-21 DIAGNOSIS — J438 Other emphysema: Secondary | ICD-10-CM

## 2014-04-21 DIAGNOSIS — E782 Mixed hyperlipidemia: Secondary | ICD-10-CM | POA: Diagnosis present

## 2014-04-21 DIAGNOSIS — R6883 Chills (without fever): Secondary | ICD-10-CM | POA: Diagnosis not present

## 2014-04-21 DIAGNOSIS — I10 Essential (primary) hypertension: Secondary | ICD-10-CM | POA: Diagnosis present

## 2014-04-21 DIAGNOSIS — E780 Pure hypercholesterolemia: Secondary | ICD-10-CM | POA: Diagnosis present

## 2014-04-21 DIAGNOSIS — J449 Chronic obstructive pulmonary disease, unspecified: Secondary | ICD-10-CM | POA: Diagnosis present

## 2014-04-21 DIAGNOSIS — T8351XD Infection and inflammatory reaction due to indwelling urinary catheter, subsequent encounter: Secondary | ICD-10-CM | POA: Diagnosis not present

## 2014-04-21 DIAGNOSIS — H540 Blindness, both eyes: Secondary | ICD-10-CM | POA: Diagnosis present

## 2014-04-21 DIAGNOSIS — Z79899 Other long term (current) drug therapy: Secondary | ICD-10-CM

## 2014-04-21 DIAGNOSIS — N390 Urinary tract infection, site not specified: Secondary | ICD-10-CM

## 2014-04-21 LAB — URINE MICROSCOPIC-ADD ON

## 2014-04-21 LAB — COMPREHENSIVE METABOLIC PANEL
ALK PHOS: 103 U/L (ref 39–117)
ALT: 193 U/L — AB (ref 0–53)
ANION GAP: 11 (ref 5–15)
AST: 230 U/L — AB (ref 0–37)
Albumin: 3.9 g/dL (ref 3.5–5.2)
BUN: 16 mg/dL (ref 6–23)
CO2: 23 mmol/L (ref 19–32)
Calcium: 8.4 mg/dL (ref 8.4–10.5)
Chloride: 89 mmol/L — ABNORMAL LOW (ref 96–112)
Creatinine, Ser: 1.13 mg/dL (ref 0.50–1.35)
GFR calc Af Amer: 71 mL/min — ABNORMAL LOW (ref >90)
GFR calc non Af Amer: 61 mL/min — ABNORMAL LOW (ref >90)
Glucose, Bld: 113 mg/dL — ABNORMAL HIGH (ref 70–99)
Potassium: 4 mmol/L (ref 3.5–5.1)
Sodium: 123 mmol/L — ABNORMAL LOW (ref 135–145)
Total Bilirubin: 0.8 mg/dL (ref 0.3–1.2)
Total Protein: 6.7 g/dL (ref 6.0–8.3)

## 2014-04-21 LAB — CBC WITH DIFFERENTIAL/PLATELET
BASOS ABS: 0 10*3/uL (ref 0.0–0.1)
BASOS PCT: 0 % (ref 0–1)
EOS ABS: 0 10*3/uL (ref 0.0–0.7)
Eosinophils Relative: 0 % (ref 0–5)
HCT: 33 % — ABNORMAL LOW (ref 39.0–52.0)
HEMOGLOBIN: 11.7 g/dL — AB (ref 13.0–17.0)
Lymphocytes Relative: 4 % — ABNORMAL LOW (ref 12–46)
Lymphs Abs: 0.4 10*3/uL — ABNORMAL LOW (ref 0.7–4.0)
MCH: 31.7 pg (ref 26.0–34.0)
MCHC: 35.5 g/dL (ref 30.0–36.0)
MCV: 89.4 fL (ref 78.0–100.0)
Monocytes Absolute: 1.1 10*3/uL — ABNORMAL HIGH (ref 0.1–1.0)
Monocytes Relative: 9 % (ref 3–12)
NEUTROS ABS: 9.9 10*3/uL — AB (ref 1.7–7.7)
NEUTROS PCT: 87 % — AB (ref 43–77)
PLATELETS: 280 10*3/uL (ref 150–400)
RBC: 3.69 MIL/uL — AB (ref 4.22–5.81)
RDW: 12.6 % (ref 11.5–15.5)
WBC: 11.3 10*3/uL — ABNORMAL HIGH (ref 4.0–10.5)

## 2014-04-21 LAB — PROCALCITONIN: PROCALCITONIN: 0.29 ng/mL

## 2014-04-21 LAB — URINALYSIS, ROUTINE W REFLEX MICROSCOPIC
Bilirubin Urine: NEGATIVE
Glucose, UA: NEGATIVE mg/dL
KETONES UR: NEGATIVE mg/dL
Nitrite: NEGATIVE
PROTEIN: NEGATIVE mg/dL
Specific Gravity, Urine: 1.012 (ref 1.005–1.030)
UROBILINOGEN UA: 0.2 mg/dL (ref 0.0–1.0)
pH: 7 (ref 5.0–8.0)

## 2014-04-21 LAB — PROTIME-INR
INR: 1.19 (ref 0.00–1.49)
Prothrombin Time: 15.2 seconds (ref 11.6–15.2)

## 2014-04-21 LAB — APTT: aPTT: 39 seconds — ABNORMAL HIGH (ref 24–37)

## 2014-04-21 LAB — LACTIC ACID, PLASMA
LACTIC ACID, VENOUS: 1.1 mmol/L (ref 0.5–2.0)
Lactic Acid, Venous: 1.1 mmol/L (ref 0.5–2.0)

## 2014-04-21 LAB — I-STAT CG4 LACTIC ACID, ED: Lactic Acid, Venous: 2.12 mmol/L (ref 0.5–2.0)

## 2014-04-21 MED ORDER — ACETAMINOPHEN 500 MG PO TABS: 1000.0000 mg | ORAL_TABLET | Freq: Once | ORAL | Status: AC

## 2014-04-21 MED ORDER — PIPERACILLIN-TAZOBACTAM 3.375 G IVPB 30 MIN
3.3750 g | Freq: Once | INTRAVENOUS | Status: AC
  Administered 2014-04-21: 3.375 g via INTRAVENOUS
  Filled 2014-04-21: qty 50

## 2014-04-21 MED ORDER — SODIUM CHLORIDE 0.9 % IJ SOLN
3.0000 mL | Freq: Two times a day (BID) | INTRAMUSCULAR | Status: DC
  Administered 2014-04-23: 3 mL via INTRAVENOUS

## 2014-04-21 MED ORDER — MULTI-VITAMIN/MINERALS PO TABS: 1.0000 | ORAL_TABLET | Freq: Every day | ORAL | Status: DC

## 2014-04-21 MED ORDER — RISAQUAD PO CAPS
1.0000 | ORAL_CAPSULE | Freq: Every day | ORAL | Status: DC
  Administered 2014-04-22 – 2014-04-24 (×3): 1 via ORAL
  Filled 2014-04-21 (×3): qty 1

## 2014-04-21 MED ORDER — FINASTERIDE 5 MG PO TABS
5.0000 mg | ORAL_TABLET | Freq: Every day | ORAL | Status: DC
  Administered 2014-04-21 – 2014-04-24 (×4): 5 mg via ORAL
  Filled 2014-04-21 (×4): qty 1

## 2014-04-21 MED ORDER — ONDANSETRON HCL 4 MG/2ML IJ SOLN: 4.0000 mg | Freq: Four times a day (QID) | INTRAMUSCULAR | Status: DC | PRN

## 2014-04-21 MED ORDER — SODIUM CHLORIDE 0.9 % IV BOLUS (SEPSIS)
1000.0000 mL | Freq: Once | INTRAVENOUS | Status: AC
  Administered 2014-04-21: 1000 mL via INTRAVENOUS

## 2014-04-21 MED ORDER — PANTOPRAZOLE SODIUM 40 MG PO TBEC
40.0000 mg | DELAYED_RELEASE_TABLET | Freq: Every day | ORAL | Status: DC
Start: 2014-04-22 — End: 2014-04-24
  Administered 2014-04-22 – 2014-04-24 (×3): 40 mg via ORAL
  Filled 2014-04-21 (×3): qty 1

## 2014-04-21 MED ORDER — VANCOMYCIN HCL IN DEXTROSE 1-5 GM/200ML-% IV SOLN
1000.0000 mg | Freq: Two times a day (BID) | INTRAVENOUS | Status: DC
  Administered 2014-04-22 – 2014-04-23 (×3): 1000 mg via INTRAVENOUS
  Filled 2014-04-21 (×4): qty 200

## 2014-04-21 MED ORDER — ACIDOPHILUS 100 MG PO CAPS: 100.0000 mg | ORAL_CAPSULE | Freq: Every day | ORAL | Status: DC

## 2014-04-21 MED ORDER — ASPIRIN 81 MG PO CHEW
81.0000 mg | CHEWABLE_TABLET | Freq: Every day | ORAL | Status: DC
  Administered 2014-04-22 – 2014-04-24 (×3): 81 mg via ORAL
  Filled 2014-04-21 (×3): qty 1

## 2014-04-21 MED ORDER — ONDANSETRON HCL 4 MG PO TABS: 4.0000 mg | ORAL_TABLET | Freq: Four times a day (QID) | ORAL | Status: DC | PRN

## 2014-04-21 MED ORDER — SALMETEROL XINAFOATE 50 MCG/DOSE IN AEPB
1.0000 | INHALATION_SPRAY | Freq: Two times a day (BID) | RESPIRATORY_TRACT | Status: DC
  Administered 2014-04-21 – 2014-04-24 (×6): 1 via RESPIRATORY_TRACT
  Filled 2014-04-21: qty 0

## 2014-04-21 MED ORDER — ACETAMINOPHEN 325 MG PO TABS
650.0000 mg | ORAL_TABLET | Freq: Four times a day (QID) | ORAL | Status: DC | PRN
  Administered 2014-04-21: 650 mg via ORAL
  Filled 2014-04-21: qty 2

## 2014-04-21 MED ORDER — SODIUM CHLORIDE 0.9 % IV SOLN
INTRAVENOUS | Status: DC
  Administered 2014-04-21 – 2014-04-24 (×5): via INTRAVENOUS

## 2014-04-21 MED ORDER — DEXTROSE 5 % IV SOLN: 1.0000 g | Freq: Three times a day (TID) | INTRAVENOUS | Status: DC

## 2014-04-21 MED ORDER — LOSARTAN POTASSIUM 50 MG PO TABS
50.0000 mg | ORAL_TABLET | Freq: Every day | ORAL | Status: DC
  Filled 2014-04-21: qty 1

## 2014-04-21 MED ORDER — ACETAMINOPHEN 325 MG PO TABS
650.0000 mg | ORAL_TABLET | Freq: Four times a day (QID) | ORAL | Status: DC | PRN
  Administered 2014-04-22 – 2014-04-23 (×2): 650 mg via ORAL
  Filled 2014-04-21 (×2): qty 2

## 2014-04-21 MED ORDER — CEFEPIME HCL 2 G IJ SOLR
2.0000 g | Freq: Once | INTRAMUSCULAR | Status: AC
  Administered 2014-04-21: 2 g via INTRAVENOUS
  Filled 2014-04-21: qty 2

## 2014-04-21 MED ORDER — ADULT MULTIVITAMIN W/MINERALS CH
1.0000 | ORAL_TABLET | Freq: Every day | ORAL | Status: DC
  Administered 2014-04-22 – 2014-04-24 (×3): 1 via ORAL
  Filled 2014-04-21 (×3): qty 1

## 2014-04-21 MED ORDER — ENOXAPARIN SODIUM 40 MG/0.4ML ~~LOC~~ SOLN
40.0000 mg | SUBCUTANEOUS | Status: DC
  Administered 2014-04-21 – 2014-04-23 (×3): 40 mg via SUBCUTANEOUS
  Filled 2014-04-21 (×3): qty 0.4

## 2014-04-21 MED ORDER — IBUPROFEN 800 MG PO TABS
800.0000 mg | ORAL_TABLET | Freq: Once | ORAL | Status: AC
  Administered 2014-04-21: 800 mg via ORAL
  Filled 2014-04-21: qty 1

## 2014-04-21 MED ORDER — ATORVASTATIN CALCIUM 20 MG PO TABS
20.0000 mg | ORAL_TABLET | Freq: Every day | ORAL | Status: DC
Start: 2014-04-22 — End: 2014-04-22
  Filled 2014-04-21: qty 1

## 2014-04-21 MED ORDER — CALCIUM CARBONATE 1250 (500 CA) MG PO TABS
1.0000 | ORAL_TABLET | Freq: Every day | ORAL | Status: DC
  Administered 2014-04-22 – 2014-04-24 (×3): 500 mg via ORAL
  Filled 2014-04-21 (×3): qty 1

## 2014-04-21 MED ORDER — PIPERACILLIN-TAZOBACTAM 3.375 G IVPB
3.3750 g | Freq: Three times a day (TID) | INTRAVENOUS | Status: DC
  Administered 2014-04-22 – 2014-04-24 (×8): 3.375 g via INTRAVENOUS
  Filled 2014-04-21 (×9): qty 50

## 2014-04-21 MED ORDER — SODIUM CHLORIDE 0.9 % IV SOLN
1250.0000 mg | INTRAVENOUS | Status: AC
  Administered 2014-04-21: 1250 mg via INTRAVENOUS
  Filled 2014-04-21: qty 1250

## 2014-04-21 MED ORDER — TAMSULOSIN HCL 0.4 MG PO CAPS
0.4000 mg | ORAL_CAPSULE | Freq: Every day | ORAL | Status: DC
Start: 2014-04-22 — End: 2014-04-24
  Administered 2014-04-22 – 2014-04-24 (×3): 0.4 mg via ORAL
  Filled 2014-04-21 (×3): qty 1

## 2014-04-21 MED ORDER — ACETAMINOPHEN 650 MG RE SUPP: 650.0000 mg | Freq: Four times a day (QID) | RECTAL | Status: DC | PRN

## 2014-04-21 NOTE — ED Provider Notes (Signed)
CSN: [id number]     Arrival date & time 04/21/14  1335 History   First MD Initiated Contact with Patient 04/21/14 1509     Chief Complaint  Patient presents with  . Allergic Reaction  . Fatigue     (Consider location/radiation/quality/duration/timing/severity/associated sxs/prior Treatment) HPI   Ricardo [name] is a(n) 79 y.o. male who presents to the ED with CC of Shaking chills and fatigue. He has a pmh of Meningitis. He was diagnosed in May of 2015 and admitted to the [facility] clinic. His Daughter states that he has been on antibiotics steadily since he was discharged from the [facility] clinic. He was recently diagnosed with a "bladder stone" by Dr. [name]. He has a chronic indwelling foley catheter that was changed yesterday. The patient has had a chronic UTI with the catheter and stone. He just finished a course of Levaquin and is on his third day a Bactrim. Contrary to the intake note he has had no sxs of allergic rxn such as hives, rash, angioedema, wheezing, change in voice. He is c/o of soreness of his neck but not of headache. He denies myalgias, arthralgias. He has a chronic cough and had a recent URI.  Past Medical History  Diagnosis Date  . Rotator cuff tear     right  . 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 Hypertension   . High cholesterol   . Skin cancer   . Meningitis   . Enlarged prostate   . 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 Deaf     due to meningitis  . Loss of balance     due to meningitis  . Unable to walk     due to meningitis  . Eczema    Past Surgical History  Procedure Laterality Date  . Appendectomy    . Prostate surgery      biopsy x 2  . Tonsillectomy      childhood  . Testicle removal Left     October 1956  . Cochlear implant     Family History  Problem Relation Age of Onset  . Heart failure Father   . Stroke Mother    History  Substance Use Topics  . Smoking status: Former Smoker -- 10 years    Types: Cigarettes    Start date:  02/10/1955    Quit date: 02/09/1965  . Smokeless tobacco: Never Used  . Alcohol Use: No    Review of Systems   Ten systems reviewed and are negative for acute change, except as noted in the HPI.   Allergies  Peanut oil  Home Medications   Prior to Admission medications   Medication Sig Start Date End Date Taking? Authorizing Provider  aspirin 81 MG chewable tablet Take one tablet by mouth once daily 02/21/14  Yes [name], DO  atorvastatin (LIPITOR) 20 MG tablet Take 1 tablet (20 mg total) by mouth daily at 6 PM. 02/09/14  Yes [name], DO  calcium carbonate (OS-CAL - DOSED IN MG OF ELEMENTAL CALCIUM) 1250 (500 CA) MG tablet Take 1 tablet by mouth daily with breakfast.   Yes Historical Provider, MD  Cholecalciferol (CVS D3) 5000 UNITS capsule Take 5,000 Units by mouth every morning.    Yes Historical Provider, MD  finasteride (PROSCAR) 5 MG tablet Take 1 tablet (5 mg total) by mouth daily. 02/21/14  Yes [name], DO  Lactobacillus (ACIDOPHILUS) 100 MG CAPS Take 100 mg by mouth daily.   Yes  Historical Provider, MD  losartan (COZAAR) 50 MG tablet Take 1 tablet (50 mg total) by mouth daily. 02/09/14  Yes [name], DO  Melatonin 3 MG CAPS Take 1 capsule by mouth at bedtime.    Yes Historical Provider, MD  Multiple Vitamins-Minerals (MULTIVITAMIN WITH MINERALS) tablet Take 1 tablet by mouth daily.   Yes Historical Provider, MD  pantoprazole (PROTONIX) 40 MG tablet Take one tablet by mouth once daily for stomach 02/21/14  Yes [name], DO  salmeterol (SEREVENT) 50 MCG/DOSE diskus inhaler Inhale 1 puff into the lungs 2 (two) times daily. 02/09/14  Yes [name], DO  sulfamethoxazole-trimethoprim (BACTRIM DS,SEPTRA DS) 800-160 MG per tablet Take 1 tablet by mouth 2 (two) times daily. 04/19/14 04/23/14 Yes Historical Provider, MD  tamsulosin (FLOMAX) 0.4 MG CAPS capsule Take one tablet by mouth once daily for prostate 02/21/14  Yes [name], DO  trimethoprim (TRIMPEX) 100  MG tablet Take one tablet by mouth once daily 02/21/14  Yes [name], DO  amoxicillin-clavulanate (AUGMENTIN) 875-125 MG per tablet Take one tablet by mouth twice daily for infection Patient not taking: Reported on 04/21/2014 03/14/14   [name], DO   BP 151/96 mmHg  Pulse 94  Temp(Src) 102.2 F (39 C) (Rectal)  Resp 24  SpO2 99% Physical Exam  Constitutional: He appears well-developed and well-nourished.  Toxic appearance with Flushed cheeks and tremors. Febrile appearance  HENT:  Head: Normocephalic and atraumatic.  Right Ear: External ear normal.  Left Ear: External ear normal.  Mouth/Throat: Oropharynx is clear and moist.  Eyes: Conjunctivae are normal.  Blind, unable to track finger  Neck: Normal range of motion. Neck supple.  No meningismus  Cardiovascular: Normal rate and regular rhythm.   Pulmonary/Chest: Effort normal and breath sounds normal. No respiratory distress.  Abdominal: Soft. Bowel sounds are normal. He exhibits no distension and no mass. There is no tenderness. There is no guarding.  Genitourinary:  Foley catheter in place. Severe erosion on the ventral surface of the penis  Lymphadenopathy:    He has no cervical adenopathy.  Skin: Skin is warm and dry.  flushed  Nursing note and vitals reviewed.   ED Course  Procedures (including critical care time) Labs Review Labs Reviewed  CBC WITH DIFFERENTIAL/PLATELET - Abnormal; Notable for the following:    WBC 11.3 (*)    RBC 3.69 (*)    Hemoglobin 11.7 (*)    HCT 33.0 (*)    Neutrophils Relative % 87 (*)    Neutro Abs 9.9 (*)    Lymphocytes Relative 4 (*)    Lymphs Abs 0.4 (*)    Monocytes Absolute 1.1 (*)    All other components within normal limits  I-STAT CG4 LACTIC ACID, ED - Abnormal; Notable for the following:    Lactic Acid, Venous 2.12 (*)    All other components within normal limits  CULTURE, BLOOD (ROUTINE X 2)  CULTURE, BLOOD (ROUTINE X 2)  COMPREHENSIVE METABOLIC PANEL     Imaging Review No results found.   EKG Interpretation   Date/Time:  Saturday April 21 2014 14:45:00 EDT Ventricular Rate:  95 PR Interval:    QRS Duration: 109 QT Interval:  351 QTC Calculation: 441 R Axis:   81 Text Interpretation:  Normal sinus rhythm Borderline right axis deviation  ST elevation, consider inferior injury Confirmed by YAO  MD, DAVID ([id number])  on 04/21/2014 3:32:35 PM      CRITICAL CARE Performed by: Margarita Mail   Total critical care time:  30  Critical care time was exclusive of separately billable procedures and treating other patients.  Critical care was necessary to treat or prevent imminent or life-threatening deterioration.  Critical care was time spent personally by me on the following activities: development of treatment plan with patient and/or surrogate as well as nursing, discussions with consultants, evaluation of patient's response to treatment, examination of patient, obtaining history from patient or surrogate, ordering and performing treatments and interventions, ordering and review of laboratory studies, ordering and review of radiographic studies, pulse oximetry and re-evaluation of patient's condition.  MDM   Final diagnoses:  None    3:51 PM BP 161/101 mmHg  Pulse 96  Temp(Src) 102.2 F (39 C) (Rectal)  Resp 16  Ht 6\' 1"  (1.854 m)  Wt 180 lb (81.647 kg)  BMI 23.75 kg/m2  SpO2 98% Patient febrile, hypertensive.  Shaking chills, unknown source, however I suspect urosepsis.  I have ordered vanc and cefepime.'Patient seen in shared visit with attending physician.  5:09 PM  Filed Vitals:   04/21/14 1538 04/21/14 1612 04/21/14 1643 04/21/14 1653  BP:  142/68 141/64   Pulse:  94 101   Temp:    102.2 F (39 C)  TempSrc:    Rectal  Resp:  22 17   Height: 6\' 1"  (1.854 m)     Weight: 180 lb (81.647 kg)     SpO2:  94% 94%     Patient with continued fever. Tahcycardic tp 101 i have ordered Motrin I spoke with Dr. Alinda Money  who read the culture reports.  The patient  Grew out proteus which was resistant to TMP, Levaquin, as well as Strephomonas that was sensitive to TMP and Levaquin.  i suspect given the culture reports this may be due to the Proteus which is sensitive to Keflex, cefazolin, ceftriaxone, and Augmentin. Patient has received Cefepime/Vanc which should cove for the infection as well as Flu swab,. He has a transaminitis and  Hyponatremia,. His abdomen is soft without focal tenderness.    Patient meeting sepsis criteria, Fluids and ABX coverage given within first 30 minutes Appears stable for admission. Dr. Carles Collet is accepting physician for [facility].  Margarita Mail, PA-C 04/22/14 [place] Yao, MD 04/22/14 [phone]

## 2014-04-21 NOTE — ED Notes (Signed)
Spoke with Jeannene Patella, CN

## 2014-04-21 NOTE — H&P (Signed)
History and Physical  Ricardo [name]:[id number] DOB: [birth year] DOA: 04/21/2014   PCP: [name], DO   Chief Complaint: chills and rigors with gait instability  HPI:  79 year old male with a history of hyperlipidemia, hypertension, COPD, neurogenic bladder, bladder stones, recurrent UTI presents with fevers, chills, rigors on the morning of 04/21/2014. The patient had been in his usual state of health on 04/20/2014. The patient recently moved from [place] to stay with his daughter here in [place]. Notably, the patient was admitted to the [facility] clinic in May 2015 with meningitis and Streptococcus pneumoniae bacteremia. 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 Since that period of time, the patient has had a neurogenic bladder requiring a Foley catheter. He has his Foley catheter changed every 2 weeks. His last Foley catheter change was 04/20/2014. Because of his recurrent UTI, the patient has been on and off of Augmentin for nearly 1 year. However most recently the patient finished a 5 day course of levofloxacin on 04/19/2014. The patient then was started on Bactrim DS on 04/20/2014.  Patient denies headache, chest pain, vomiting, diarrhea, abdominal pain, dysuria, hematuria, hematochezia, melena. The patient has some nausea. He always has some shortness of breath on exertion, but this has not been changed. The patient complains of neck pain, but states that this actually has been getting better.  In the emergency department, the patient was noted to have temperature 102.22F, heart rate 101, blood pressure 141/64, oxygen saturation 95% on room air. WBC was 11.3. Serum sodium was 123. Serum creatinine 1.13. Blood cultures and urine cultures were obtained. Lactic acid was 2.12. Urinalysis showed 11-20 WBCs. Chest x-ray negative for infiltrates.  Assessment/Plan: Sepsis -Source unclear presently although suspect urine -Patient  has had a history of Pseudomonas and Klebsiella UTIs -Most recently, the patient had Proteus mirabilis from his culture and the urine -Chek procalcitonin -IV fluids -Serial lactic acid -Empiric vancomycin and Zosyn -As discussed, the patient has an indwelling Foley catheter last changed on 04/20/2014 -doubt meningitis Hyponatremia -Secondary to volume depletion -Continue IV fluids  Transaminasemia -likely due to sepsis -no abdominal pain or vomiting -trend COPD -Stable presently on room air -Continue salmeterol neurogenic bladder with bladder stone  -Follows Dr. [name] -Surgical therapy scheduled on 05/14/2014  -Continue Flomax  -Continue finasteride  hypertension -Continue losartan Hyperlipidemia -Continue statin        Past Medical History  Diagnosis Date  . Rotator cuff tear     right  . 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 Hypertension   . High cholesterol   . Skin cancer   . Meningitis   . Enlarged prostate   . 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 Deaf     due to meningitis  . Loss of balance     due to meningitis  . Unable to walk     due to meningitis  . Eczema    Past Surgical History  Procedure Laterality Date  . Appendectomy    . Prostate surgery      biopsy x 2  . Tonsillectomy      childhood  . Testicle removal Left     October 1956  . Cochlear implant     Social History:  reports that he quit smoking about 49 years ago. His smoking use included Cigarettes. He started smoking about 59 years ago. He quit after 10 years of use.  He has never used smokeless tobacco. He reports that he does not drink alcohol or use illicit drugs.   Family History  Problem Relation Age of Onset  . Heart failure Father   . Stroke Mother      Allergies  Allergen Reactions  . Peanut Oil Shortness Of Breath and Swelling    Itching      Prior to Admission medications   Medication Sig Start Date End Date Taking? Authorizing Provider  aspirin 81 MG  chewable tablet Take one tablet by mouth once daily 02/21/14  Yes [name], DO  atorvastatin (LIPITOR) 20 MG tablet Take 1 tablet (20 mg total) by mouth daily at 6 PM. 02/09/14  Yes [name], DO  calcium carbonate (OS-CAL - DOSED IN MG OF ELEMENTAL CALCIUM) 1250 (500 CA) MG tablet Take 1 tablet by mouth daily with breakfast.   Yes Historical Provider, MD  Cholecalciferol (CVS D3) 5000 UNITS capsule Take 5,000 Units by mouth every morning.    Yes Historical Provider, MD  finasteride (PROSCAR) 5 MG tablet Take 1 tablet (5 mg total) by mouth daily. 02/21/14  Yes [name], DO  Lactobacillus (ACIDOPHILUS) 100 MG CAPS Take 100 mg by mouth daily.   Yes Historical Provider, MD  losartan (COZAAR) 50 MG tablet Take 1 tablet (50 mg total) by mouth daily. 02/09/14  Yes [name], DO  Melatonin 3 MG CAPS Take 1 capsule by mouth at bedtime.    Yes Historical Provider, MD  Multiple Vitamins-Minerals (MULTIVITAMIN WITH MINERALS) tablet Take 1 tablet by mouth daily.   Yes Historical Provider, MD  pantoprazole (PROTONIX) 40 MG tablet Take one tablet by mouth once daily for stomach 02/21/14  Yes [name], DO  salmeterol (SEREVENT) 50 MCG/DOSE diskus inhaler Inhale 1 puff into the lungs 2 (two) times daily. 02/09/14  Yes [name], DO  sulfamethoxazole-trimethoprim (BACTRIM DS,SEPTRA DS) 800-160 MG per tablet Take 1 tablet by mouth 2 (two) times daily. 04/19/14 04/23/14 Yes Historical Provider, MD  tamsulosin (FLOMAX) 0.4 MG CAPS capsule Take one tablet by mouth once daily for prostate 02/21/14  Yes [name], DO  trimethoprim (TRIMPEX) 100 MG tablet Take one tablet by mouth once daily 02/21/14  Yes [name], DO  amoxicillin-clavulanate (AUGMENTIN) 875-125 MG per tablet Take one tablet by mouth twice daily for infection Patient not taking: Reported on 04/21/2014 03/14/14   [name], DO    Review of Systems:  Constitutional:  No weight loss, night sweats,  Head&Eyes: No headache.  No  vision loss.  No eye pain or scotoma ENT:  No Difficulty swallowing,Tooth/dental problems,Sore throat,    Cardio-vascular:  No chest pain, Orthopnea, PND, swelling in lower extremities,  dizziness, palpitations  GI:  No  abdominal pain,  vomiting, diarrhea, loss of appetite, hematochezia, melena, heartburn, indigestion, Resp:  No coughing up of blood .No wheezing.No chest wall deformity  Skin:  no rash or lesions.  GU:  no dysuria, change in color of urine, no urgency or frequency. No flank pain.  Musculoskeletal:  No joint pain or swelling. No decreased range of motion. No back pain.  Psych:  No change in mood or affect. No depression or anxiety. Neurologic: No headache, no dysesthesia, no focal weakness, no vision loss. No syncope  Physical Exam: Filed Vitals:   04/21/14 1538 04/21/14 1612 04/21/14 1643 04/21/14 1653  BP:  142/68 141/64   Pulse:  94 101   Temp:    102.2 F (39 C)  TempSrc:    Rectal  Resp:  22 17   Height: 6\' 1"  (1.854 m)     Weight: 81.647 kg (180 lb)     SpO2:  94% 94%    General:  A&O x 3, NAD, nontoxic, pleasant/cooperative Head/Eye: No conjunctival hemorrhage, no icterus, [place]/AT, No nystagmus ENT:  No icterus,  No thrush, good dentition, no pharyngeal exudate Neck:  No masses, no lymphadenpathy, no bruits; no meningismus CV:  RRR, no rub, no gallop, no S3 Lung:  Diminished breath sounds but clear to auscultation. No wheezing Abdomen: soft/NT, +BS, nondistended, no peritoneal signs Ext: No cyanosis, No rashes, No petechiae, No lymphangitis, No edema Neuro: CNII-XII intact, strength 4/5 in bilateral upper and lower extremities, no dysmetria  Labs on Admission:  Basic Metabolic Panel:  Recent Labs Lab 04/21/14 1518  NA 123*  K 4.0  CL 89*  CO2 23  GLUCOSE 113*  BUN 16  CREATININE 1.13  CALCIUM 8.4   Liver Function Tests:  Recent Labs Lab 04/21/14 1518  AST 230*  ALT 193*  ALKPHOS 103  BILITOT 0.8  PROT 6.7  ALBUMIN 3.9   No  results for input(s): LIPASE, AMYLASE in the last 168 hours. No results for input(s): AMMONIA in the last 168 hours. CBC:  Recent Labs Lab 04/21/14 1518  WBC 11.3*  NEUTROABS 9.9*  HGB 11.7*  HCT 33.0*  MCV 89.4  PLT 280   Cardiac Enzymes: No results for input(s): CKTOTAL, CKMB, CKMBINDEX, TROPONINI in the last 168 hours. BNP: Invalid input(s): POCBNP CBG: No results for input(s): GLUCAP in the last 168 hours.  Radiological Exams on Admission: Dg Chest Port 1 View  04/21/2014   CLINICAL DATA:  Increased fatigue following initiation of new medicine  EXAM: PORTABLE CHEST - 1 VIEW  COMPARISON:  None.  FINDINGS: Cardiac shadow is within normal limits. The lungs are well aerated bilaterally. No focal infiltrate or sizable effusion is seen. No acute bony abnormality is noted.  IMPRESSION: No active disease.   Electronically Signed   By: Inez Catalina M.D.   On: 04/21/2014 16:19    EKG: Independently reviewed.     Time spent:60 minutes Code Status:   FULL Family Communication:    Daughter updated at bedside   Ravindra Baranek, DO  [facility] Pager [phone]  If 7PM-7AM, please contact night-coverage [email or web address] Password [facility] 04/21/2014, 6:08 PM

## 2014-04-21 NOTE — Progress Notes (Addendum)
ANTIBIOTIC CONSULT NOTE - INITIAL  Pharmacy Consult for Vancomycin, Cefepime Indication: urosepsis  Allergies  Allergen Reactions  . Peanut Oil Shortness Of Breath and Swelling    Itching    Patient Measurements:    Vital Signs: Temp: 102.2 F (39 C) (03/19 1445) Temp Source: Rectal (03/19 1445) BP: 161/101 mmHg (03/19 1537) Pulse Rate: 96 (03/19 1537) Intake/Output from previous day:   Intake/Output from this shift:    Labs:  Recent Labs  04/21/14 1518  WBC 11.3*  HGB 11.7*  PLT 280   CrCl cannot be calculated (Unknown ideal weight.). No results for input(s): VANCOTROUGH, VANCOPEAK, VANCORANDOM, GENTTROUGH, GENTPEAK, GENTRANDOM, TOBRATROUGH, TOBRAPEAK, TOBRARND, AMIKACINPEAK, AMIKACINTROU, AMIKACIN in the last 72 hours.   Microbiology: No results found for this or any previous visit (from the past 720 hour(s)).  Medical History: Past Medical History  Diagnosis Date  . Rotator cuff tear     right  . 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 Hypertension   . High cholesterol   . Skin cancer   . Meningitis   . Enlarged prostate   . 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 Deaf     due to meningitis  . Loss of balance     due to meningitis  . Unable to walk     due to meningitis  . Eczema     Assessment: 51 yoM presents with chills and fatigue.  Patient has chronic indwelling foley catheter that was changed yesterday.  Patient has chronic UTI's and was recently started on Septra after finishing a course of Levaquin per MD notes.  Pharmacy consulted to dose vancomycin and cefepime for urosepsis (code sepsis called).  Cefepime 2g x 1 given in ED.  3/19 >> Vanc >> 3/19 >> Cefepime >>  Tmax: 102.2 WBC: elevated Renal: SCr 1.12, CrCl~64 ml/min (CG) LA: 2.12  Blood and urine cultures collected.  Goal of Therapy:  Vancomycin trough level 15-20 mcg/ml  Doses adjusted per renal function Eradication of infection  Plan:  1.  Vancomycin 1250 mg IV x 1 then 1g q12h. 2.   Cefepime 1g q8h. 3.  F/u SCr, trough, cultures.  Hershal Coria 04/21/2014,3:38 PM   ADDENDUM 04/21/2014 7:31 PM Admitting MD adjusted antibiotics to vancomycin and zosyn.  Plan: 1.  Continue Vancomycin as ordered. 2.  Start Zosyn 3.375g IV q8h (4 hour infusion time). MD ordered 1st dose STAT over 30 minutes.  Hershal Coria, PharmD, BCPS Pager: [phone] 04/21/2014 7:32 PM

## 2014-04-21 NOTE — ED Notes (Addendum)
Pt's daughter reports he was recently started on Sulfamethoxazole for a "bladder stone." Started medication around Thursday night. Starting today daughter reports his eyes look weak and he has had increased fatigue. No oral swelling or facial swelling noted. Reports slight nausea. Also says, "my eye sight is the worse it's been today than usual." Grips equal, strong. Says he has been increasingly confused and was shaking this morning. Alert. Facial symmetry noted. Hx meningitis (May), TIAs (May). 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 No other c/c. Daughter at bedside is POA. Denies being on blood thinners. Has urethral catheter (placed in May, replaced yesterday).

## 2014-04-22 ENCOUNTER — Inpatient Hospital Stay (HOSPITAL_COMMUNITY): Payer: Medicare HMO

## 2014-04-22 DIAGNOSIS — R740 Nonspecific elevation of levels of transaminase and lactic acid dehydrogenase [LDH]: Secondary | ICD-10-CM

## 2014-04-22 DIAGNOSIS — R7401 Elevation of levels of liver transaminase levels: Secondary | ICD-10-CM | POA: Insufficient documentation

## 2014-04-22 LAB — INFLUENZA PANEL BY PCR (TYPE A & B)
H1N1 flu by pcr: NOT DETECTED
INFLAPCR: NEGATIVE
INFLBPCR: NEGATIVE

## 2014-04-22 LAB — HEPATIC FUNCTION PANEL
ALT: 366 U/L — ABNORMAL HIGH (ref 0–53)
AST: 312 U/L — ABNORMAL HIGH (ref 0–37)
Albumin: 3.2 g/dL — ABNORMAL LOW (ref 3.5–5.2)
Alkaline Phosphatase: 136 U/L — ABNORMAL HIGH (ref 39–117)
BILIRUBIN TOTAL: 1.3 mg/dL — AB (ref 0.3–1.2)
Bilirubin, Direct: 0.5 mg/dL (ref 0.0–0.5)
Indirect Bilirubin: 0.8 mg/dL (ref 0.3–0.9)
Total Protein: 5.5 g/dL — ABNORMAL LOW (ref 6.0–8.3)

## 2014-04-22 LAB — CBC
HCT: 31.1 % — ABNORMAL LOW (ref 39.0–52.0)
HEMOGLOBIN: 10.9 g/dL — AB (ref 13.0–17.0)
MCH: 30.9 pg (ref 26.0–34.0)
MCHC: 35 g/dL (ref 30.0–36.0)
MCV: 88.1 fL (ref 78.0–100.0)
Platelets: 199 10*3/uL (ref 150–400)
RBC: 3.53 MIL/uL — ABNORMAL LOW (ref 4.22–5.81)
RDW: 12.7 % (ref 11.5–15.5)
WBC: 6.8 10*3/uL (ref 4.0–10.5)

## 2014-04-22 LAB — BASIC METABOLIC PANEL
ANION GAP: 11 (ref 5–15)
BUN: 12 mg/dL (ref 6–23)
CO2: 19 mmol/L (ref 19–32)
Calcium: 8.3 mg/dL — ABNORMAL LOW (ref 8.4–10.5)
Chloride: 99 mmol/L (ref 96–112)
Creatinine, Ser: 1.06 mg/dL (ref 0.50–1.35)
GFR calc Af Amer: 77 mL/min — ABNORMAL LOW (ref >90)
GFR calc non Af Amer: 66 mL/min — ABNORMAL LOW (ref >90)
Glucose, Bld: 123 mg/dL — ABNORMAL HIGH (ref 70–99)
Potassium: 4.1 mmol/L (ref 3.5–5.1)
Sodium: 129 mmol/L — ABNORMAL LOW (ref 135–145)

## 2014-04-22 LAB — HEPATITIS C ANTIBODY: HCV Ab: NEGATIVE

## 2014-04-22 LAB — HEPATITIS B SURFACE ANTIGEN: Hepatitis B Surface Ag: NEGATIVE

## 2014-04-22 NOTE — Progress Notes (Signed)
PROGRESS NOTE  Ricardo [name] OEV:[id number] DOB: 30-Apr-1935 DOA: 04/21/2014 PCP: [name], DO   Assessment/Plan: Sepsis -Source unclear presently although suspect urine -Patient has had a history of Pseudomonas and Klebsiella UTIs -Most recently, the patient had Proteus mirabilis from his urine culture  -Chek procalcitonin--0.29 -IV fluids -Serial lactic acid-->1.1 -Empiric vancomycin and Zosyn pending culture data -As discussed, the patient has an indwelling Foley catheter last changed on 04/20/2014 -doubt meningitis -INR and PTT unremarkable -await influenza PCR Hyponatremia -Secondary to volume depletion -Continue IV fluids-->improving  Transaminasemia -likely due to sepsis -no abdominal pain or vomiting -RUQ U/S -Hepatitis B surface antigen, hepatitis antibody -d/c lipitor COPD -Stable presently on room air -Continue salmeterol neurogenic bladder with bladder stone  -Follows Dr. [name] -Surgical therapy scheduled on 05/14/2014  -Continue Flomax  -Continue finasteride  hypertension -hold losartan as BP is soft Hyperlipidemia -Continue statin   Family Communication:   Daughter updated at beside Disposition Plan:   Home when medically stable       Procedures/Studies: Dg Chest Port 1 View  04/21/2014   CLINICAL DATA:  Increased fatigue following initiation of new medicine  EXAM: PORTABLE CHEST - 1 VIEW  COMPARISON:  None.  FINDINGS: Cardiac shadow is within normal limits. The lungs are well aerated bilaterally. No focal infiltrate or sizable effusion is seen. No acute bony abnormality is noted.  IMPRESSION: No active disease.   Electronically Signed   By: Inez Catalina M.D.   On: 04/21/2014 16:19        Subjective: She is feeling better. He denies any chest pain, shortness breath, nausea, vomiting, diarrhea, abdominal pain. Denies any headache.  Objective: Filed Vitals:   04/21/14 2254 04/22/14 0216 04/22/14 0612 04/22/14 0844    BP: 102/52 107/56 97/47   Pulse:  81 90   Temp:  97.8 F (36.6 C) 98.1 F (36.7 C)   TempSrc:  Oral Oral   Resp:  20 20   Height:   6\' 1"  (1.854 m)   Weight:   85.9 kg (189 lb 6 oz)   SpO2:  95% 96% 93%    Intake/Output Summary (Last 24 hours) at 04/22/14 0854 Last data filed at 04/22/14 0600  Gross per 24 hour  Intake   4415 ml  Output   4300 ml  Net    115 ml   Weight change:  Exam:   General:  Pt is alert, follows commands appropriately, not in acute distress  HEENT: No icterus, No thrush, No meningismus, [place]/AT  Cardiovascular: RRR, S1/S2, no rubs, no gallops  Respiratory: Diminished breath sounds but clear to auscultation. No wheezing  Abdomen: Soft/+BS, non tender, non distended, no guarding  Extremities: No edema, No lymphangitis, No petechiae, No rashes, no synovitis  Data Reviewed: Basic Metabolic Panel:  Recent Labs Lab 04/21/14 1518 04/22/14 0559  NA 123* 129*  K 4.0 4.1  CL 89* 99  CO2 23 19  GLUCOSE 113* 123*  BUN 16 12  CREATININE 1.13 1.06  CALCIUM 8.4 8.3*   Liver Function Tests:  Recent Labs Lab 04/21/14 1518 04/22/14 0559  AST 230* 312*  ALT 193* 366*  ALKPHOS 103 136*  BILITOT 0.8 1.3*  PROT 6.7 5.5*  ALBUMIN 3.9 3.2*   No results for input(s): LIPASE, AMYLASE in the last 168 hours. No results for input(s): AMMONIA in the last 168 hours. CBC:  Recent Labs Lab 04/21/14 1518 04/22/14 0559  WBC 11.3* 6.8  NEUTROABS  9.9*  --   HGB 11.7* 10.9*  HCT 33.0* 31.1*  MCV 89.4 88.1  PLT 280 199   Cardiac Enzymes: No results for input(s): CKTOTAL, CKMB, CKMBINDEX, TROPONINI in the last 168 hours. BNP: Invalid input(s): POCBNP CBG: No results for input(s): GLUCAP in the last 168 hours.  Recent Results (from the past 240 hour(s))  Blood culture (routine x 2)     Status: None (Preliminary result)   Collection Time: 04/21/14  3:19 PM  Result Value Ref Range Status   Specimen Description BLOOD RIGHT ANTECUBITAL  Final    Special Requests BOTTLES DRAWN AEROBIC AND ANAEROBIC 5 CC EACH  Final   Culture   Final           BLOOD CULTURE RECEIVED NO GROWTH TO DATE CULTURE WILL BE HELD FOR 5 DAYS BEFORE ISSUING A FINAL NEGATIVE REPORT Performed at [facility]    Report Status PENDING  Incomplete  Blood culture (routine x 2)     Status: None (Preliminary result)   Collection Time: 04/21/14  3:26 PM  Result Value Ref Range Status   Specimen Description BLOOD BLOOD LEFT FOREARM  Final   Special Requests NONE  Final   Culture   Final           BLOOD CULTURE RECEIVED NO GROWTH TO DATE CULTURE WILL BE HELD FOR 5 DAYS BEFORE ISSUING A FINAL NEGATIVE REPORT Performed at [facility]    Report Status PENDING  Incomplete     Scheduled Meds: . acidophilus  1 capsule Oral Daily  . aspirin  81 mg Oral Daily  . calcium carbonate  1 tablet Oral Q breakfast  . enoxaparin (LOVENOX) injection  40 mg Subcutaneous Q24H  . finasteride  5 mg Oral Daily  . multivitamin with minerals  1 tablet Oral Daily  . pantoprazole  40 mg Oral Daily  . piperacillin-tazobactam (ZOSYN)  IV  3.375 g Intravenous Q8H  . salmeterol  1 puff Inhalation BID  . sodium chloride  3 mL Intravenous Q12H  . tamsulosin  0.4 mg Oral Daily  . vancomycin  1,000 mg Intravenous Q12H   Continuous Infusions: . sodium chloride 100 mL/hr at 04/21/14 1915     Marlyn Tondreau, DO  [facility] Pager [phone]  If 7PM-7AM, please contact night-coverage [email or web address] Password [email or web address] 04/22/2014, 8:54 AM   LOS: 1 day

## 2014-04-23 DIAGNOSIS — T8351XS Infection and inflammatory reaction due to indwelling urinary catheter, sequela: Secondary | ICD-10-CM

## 2014-04-23 DIAGNOSIS — N390 Urinary tract infection, site not specified: Secondary | ICD-10-CM

## 2014-04-23 LAB — CBC
HEMATOCRIT: 27.1 % — AB (ref 39.0–52.0)
Hemoglobin: 9.4 g/dL — ABNORMAL LOW (ref 13.0–17.0)
MCH: 30.7 pg (ref 26.0–34.0)
MCHC: 34.7 g/dL (ref 30.0–36.0)
MCV: 88.6 fL (ref 78.0–100.0)
Platelets: 201 10*3/uL (ref 150–400)
RBC: 3.06 MIL/uL — ABNORMAL LOW (ref 4.22–5.81)
RDW: 12.8 % (ref 11.5–15.5)
WBC: 5.6 10*3/uL (ref 4.0–10.5)

## 2014-04-23 LAB — COMPREHENSIVE METABOLIC PANEL
ALBUMIN: 2.9 g/dL — AB (ref 3.5–5.2)
ALT: 223 U/L — AB (ref 0–53)
ANION GAP: 8 (ref 5–15)
AST: 112 U/L — ABNORMAL HIGH (ref 0–37)
Alkaline Phosphatase: 154 U/L — ABNORMAL HIGH (ref 39–117)
BUN: 12 mg/dL (ref 6–23)
CO2: 20 mmol/L (ref 19–32)
Calcium: 7.9 mg/dL — ABNORMAL LOW (ref 8.4–10.5)
Chloride: 99 mmol/L (ref 96–112)
Creatinine, Ser: 1.04 mg/dL (ref 0.50–1.35)
GFR calc Af Amer: 78 mL/min — ABNORMAL LOW (ref >90)
GFR calc non Af Amer: 68 mL/min — ABNORMAL LOW (ref >90)
Glucose, Bld: 94 mg/dL (ref 70–99)
Potassium: 3.4 mmol/L — ABNORMAL LOW (ref 3.5–5.1)
Sodium: 127 mmol/L — ABNORMAL LOW (ref 135–145)
TOTAL PROTEIN: 5.2 g/dL — AB (ref 6.0–8.3)
Total Bilirubin: 0.8 mg/dL (ref 0.3–1.2)

## 2014-04-23 LAB — CREATININE, URINE, RANDOM: Creatinine, Urine: 35.98 mg/dL

## 2014-04-23 LAB — HEMOGLOBIN A1C
HEMOGLOBIN A1C: 6 % — AB (ref 4.8–5.6)
MEAN PLASMA GLUCOSE: 126 mg/dL

## 2014-04-23 LAB — SODIUM, URINE, RANDOM: SODIUM UR: 55 mmol/L

## 2014-04-23 MED ORDER — CEPASTAT 14.5 MG MT LOZG
1.0000 | LOZENGE | OROMUCOSAL | Status: DC | PRN
  Filled 2014-04-23: qty 9

## 2014-04-23 MED ORDER — MENTHOL 3 MG MT LOZG
1.0000 | LOZENGE | OROMUCOSAL | Status: DC | PRN
  Administered 2014-04-23: 3 mg via ORAL
  Filled 2014-04-23: qty 9

## 2014-04-23 NOTE — Plan of Care (Signed)
Problem: Phase II Progression Outcomes Goal: Voiding independently Outcome: Not Applicable Date Met:  80/06/34 Pt has chronic foley

## 2014-04-23 NOTE — Care Management Note (Addendum)
    Page 1 of 1   04/24/2014     12:30:52 PM CARE MANAGEMENT NOTE 04/24/2014  Patient:  Ricardo [name], Ricardo [name]   Account Number:  [id number]  Date Initiated:  04/23/2014  Documentation initiated by:  Dessa Phi  Subjective/Objective Assessment:   79 y/o m admitted w/Sepsis.     Action/Plan:   From home.   Anticipated DC Date:  04/24/2014   Anticipated DC Plan:  [place]  CM consult      Choice offered to / List presented to:             Status of service:  Completed, signed off Medicare Important Message given?  YES (If response is "NO", the following Medicare IM given date fields will be blank) Date Medicare IM given:  04/24/2014 Medicare IM given by:  [facility] Date Additional Medicare IM given:   Additional Medicare IM given by:    Discharge Disposition:  HOME/SELF CARE  Per UR Regulation:  Reviewed for med. necessity/level of care/duration of stay  If discussed at [place] of Stay Meetings, dates discussed:    Comments:  04/23/14 Dessa Phi RN BSN NCM [phone] No anticipated d/c needs.

## 2014-04-23 NOTE — Progress Notes (Addendum)
PROGRESS NOTE  Ricardo [name]:[id number] DOB: [birth year] DOA: 04/21/2014 PCP: [name], DO  Assessment/Plan: Sepsis -Source is urine -Patient has had a history of Pseudomonas and Klebsiella UTIs -Most recently, the patient had Proteus mirabilis from his urine culture  -Chek procalcitonin--0.29 -IV fluids -Serial lactic acid-->1.1 -Continue Zosyn pending culture data -d/c vanco -As discussed, the patient has an indwelling Foley catheter last changed on 04/20/2014 -doubt meningitis -INR and PTT unremarkable -await influenza PCR--neg Catheter associated UTI -culture = GNR -low colony count due to preadmission abx -continue zosyn pending culture susceptibility Hyponatremia -Secondary to volume depletion -Continue IV fluids-->improving  Transaminasemia -likely due to sepsis -no abdominal pain or vomiting -RUQ U/S--neg GB -Hepatitis B surface antigen, hepatitis antibody--neg -d/c lipitor Hyponatremia -Still likely due to volume depletion -Check urine and serum osmolarity -Urine sodium, urine creatinine COPD -Stable presently on room air -Continue salmeterol neurogenic bladder with bladder stone  -Follows Dr. [name] -Surgical therapy scheduled on 05/14/2014  -Continue Flomax  -Continue finasteride  hypertension -hold losartan as BP is soft Hyperlipidemia -hold statin due to LFTs   Family Communication:  Daughter [name] updated on phone 3/21 Disposition Plan:   Home  3/22 or 3/23     Procedures/Studies: Dg Chest Port 1 View  04/21/2014   CLINICAL DATA:  Increased fatigue following initiation of new medicine  EXAM: PORTABLE CHEST - 1 VIEW  COMPARISON:  None.  FINDINGS: Cardiac shadow is within normal limits. The lungs are well aerated bilaterally. No focal infiltrate or sizable effusion is seen. No acute bony abnormality is noted.  IMPRESSION: No active disease.   Electronically Signed   By: Inez Catalina M.D.   On: 04/21/2014 16:19   US  Abdomen Limited Ruq  04/22/2014   CLINICAL DATA:  Elevated liver transaminases. History of hypertension and a remote history of left testicular carcinoma.  EXAM: US ABDOMEN LIMITED - RIGHT UPPER QUADRANT  COMPARISON:  None.  FINDINGS: Gallbladder:  No gallstones or wall thickening visualized. No sonographic Murphy sign noted.  Common bile duct:  Diameter: 3 mm  Liver:  Liver shows a coarsened and mildly increased diffuse echotexture. No liver mass or focal lesion. Hepatopetal flow documented in the portal vein.  IMPRESSION: 1. No acute findings.  Normal gallbladder.  No bile duct dilation. 2. Coarse increased liver echogenicity suggests hepatic steatosis. No liver mass or focal lesion.   Electronically Signed   By: Lajean Manes M.D.   On: 04/22/2014 15:17         Subjective: Patient denies fevers, chills, headache, chest pain, dyspnea, nausea, vomiting, diarrhea, abdominal pain, dysuria, hematuria   Objective: Filed Vitals:   04/22/14 2254 04/23/14 0521 04/23/14 0957 04/23/14 1329  BP: 122/84 110/76  108/80  Pulse: 82 64  69  Temp: 98.8 F (37.1 C) 98.1 F (36.7 C)  98 F (36.7 C)  TempSrc: Oral Oral  Oral  Resp: 20 18  20   Height:      Weight:  87.6 kg (193 lb 2 oz)    SpO2: 94% 94% 95% 95%    Intake/Output Summary (Last 24 hours) at 04/23/14 1605 Last data filed at 04/23/14 1500  Gross per 24 hour  Intake   5000 ml  Output   3901 ml  Net   1099 ml   Weight change: 5.953 kg (13 lb 2 oz) Exam:   General:  Pt is alert, follows commands appropriately, not in acute distress  HEENT: No  icterus, No thrush, No meningismus, [place]/AT  Cardiovascular: RRR, S1/S2, no rubs, no gallops  Respiratory: CTA bilaterally, no wheezing, no crackles, no rhonchi  Abdomen: Soft/+BS, non tender, non distended, no guarding  Extremities: No edema, No lymphangitis, No petechiae, No rashes, no synovitis  Data Reviewed: Basic Metabolic Panel:  Recent Labs Lab 04/21/14 1518 04/22/14 0559  04/23/14 0505  NA 123* 129* 127*  K 4.0 4.1 3.4*  CL 89* 99 99  CO2 23 19 20   GLUCOSE 113* 123* 94  BUN 16 12 12   CREATININE 1.13 1.06 1.04  CALCIUM 8.4 8.3* 7.9*   Liver Function Tests:  Recent Labs Lab 04/21/14 1518 04/22/14 0559 04/23/14 0505  AST 230* 312* 112*  ALT 193* 366* 223*  ALKPHOS 103 136* 154*  BILITOT 0.8 1.3* 0.8  PROT 6.7 5.5* 5.2*  ALBUMIN 3.9 3.2* 2.9*   No results for input(s): LIPASE, AMYLASE in the last 168 hours. No results for input(s): AMMONIA in the last 168 hours. CBC:  Recent Labs Lab 04/21/14 1518 04/22/14 0559 04/23/14 0505  WBC 11.3* 6.8 5.6  NEUTROABS 9.9*  --   --   HGB 11.7* 10.9* 9.4*  HCT 33.0* 31.1* 27.1*  MCV 89.4 88.1 88.6  PLT 280 199 201   Cardiac Enzymes: No results for input(s): CKTOTAL, CKMB, CKMBINDEX, TROPONINI in the last 168 hours. BNP: Invalid input(s): POCBNP CBG: No results for input(s): GLUCAP in the last 168 hours.  Recent Results (from the past 240 hour(s))  Blood culture (routine x 2)     Status: None (Preliminary result)   Collection Time: 04/21/14  3:19 PM  Result Value Ref Range Status   Specimen Description BLOOD RIGHT ANTECUBITAL  Final   Special Requests BOTTLES DRAWN AEROBIC AND ANAEROBIC 5 CC EACH  Final   Culture   Final           BLOOD CULTURE RECEIVED NO GROWTH TO DATE CULTURE WILL BE HELD FOR 5 DAYS BEFORE ISSUING A FINAL NEGATIVE REPORT Performed at [facility]    Report Status PENDING  Incomplete  Urine culture     Status: None (Preliminary result)   Collection Time: 04/21/14  3:25 PM  Result Value Ref Range Status   Specimen Description URINE, CATHETERIZED  Final   Special Requests NONE  Final   Colony Count   Final    50,000 COLONIES/ML Performed at [facility]    Culture   Final    [place] Performed at [facility]    Report Status PENDING  Incomplete  Blood culture (routine x 2)     Status: None (Preliminary result)   Collection Time:  04/21/14  3:26 PM  Result Value Ref Range Status   Specimen Description BLOOD BLOOD LEFT FOREARM  Final   Special Requests NONE  Final   Culture   Final           BLOOD CULTURE RECEIVED NO GROWTH TO DATE CULTURE WILL BE HELD FOR 5 DAYS BEFORE ISSUING A FINAL NEGATIVE REPORT Performed at [facility]    Report Status PENDING  Incomplete     Scheduled Meds: . acidophilus  1 capsule Oral Daily  . aspirin  81 mg Oral Daily  . calcium carbonate  1 tablet Oral Q breakfast  . enoxaparin (LOVENOX) injection  40 mg Subcutaneous Q24H  . finasteride  5 mg Oral Daily  . multivitamin with minerals  1 tablet Oral Daily  . pantoprazole  40 mg Oral Daily  .  piperacillin-tazobactam (ZOSYN)  IV  3.375 g Intravenous Q8H  . salmeterol  1 puff Inhalation BID  . sodium chloride  3 mL Intravenous Q12H  . tamsulosin  0.4 mg Oral Daily   Continuous Infusions: . sodium chloride 100 mL/hr at 04/23/14 1127     Martine Trageser, DO  [facility] Pager [phone]  If 7PM-7AM, please contact night-coverage [email or web address] Password [email or web address] 04/23/2014, 4:05 PM   LOS: 2 days

## 2014-04-24 DIAGNOSIS — I10 Essential (primary) hypertension: Secondary | ICD-10-CM

## 2014-04-24 DIAGNOSIS — T8351XD Infection and inflammatory reaction due to indwelling urinary catheter, subsequent encounter: Secondary | ICD-10-CM

## 2014-04-24 LAB — URINE CULTURE: Colony Count: 50000

## 2014-04-24 LAB — OSMOLALITY, URINE: Osmolality, Ur: 245 mOsm/kg — ABNORMAL LOW (ref 390–1090)

## 2014-04-24 LAB — OSMOLALITY: Osmolality: 264 mOsm/kg — ABNORMAL LOW (ref 275–300)

## 2014-04-24 LAB — COMPREHENSIVE METABOLIC PANEL
ALK PHOS: 162 U/L — AB (ref 39–117)
ALT: 149 U/L — AB (ref 0–53)
AST: 46 U/L — AB (ref 0–37)
Albumin: 3.1 g/dL — ABNORMAL LOW (ref 3.5–5.2)
Anion gap: 10 (ref 5–15)
BUN: 10 mg/dL (ref 6–23)
CALCIUM: 7.8 mg/dL — AB (ref 8.4–10.5)
CO2: 21 mmol/L (ref 19–32)
Chloride: 99 mmol/L (ref 96–112)
Creatinine, Ser: 0.97 mg/dL (ref 0.50–1.35)
GFR calc non Af Amer: 78 mL/min — ABNORMAL LOW (ref >90)
GLUCOSE: 86 mg/dL (ref 70–99)
POTASSIUM: 3.5 mmol/L (ref 3.5–5.1)
SODIUM: 130 mmol/L — AB (ref 135–145)
TOTAL PROTEIN: 5.7 g/dL — AB (ref 6.0–8.3)
Total Bilirubin: 0.7 mg/dL (ref 0.3–1.2)

## 2014-04-24 MED ORDER — CEFPODOXIME PROXETIL 200 MG PO TABS: 200.0000 mg | ORAL_TABLET | Freq: Two times a day (BID) | ORAL | Status: DC

## 2014-04-24 MED ORDER — CEFPODOXIME PROXETIL 200 MG PO TABS
200.0000 mg | ORAL_TABLET | Freq: Two times a day (BID) | ORAL | Status: DC
  Administered 2014-04-24: 200 mg via ORAL
  Filled 2014-04-24: qty 1

## 2014-04-24 NOTE — Discharge Summary (Addendum)
Physician Discharge Summary  Ricardo [name] PNT:[id number] DOB: 11/21/1935 DOA: 04/21/2014  PCP: [name], DO  Admit date: 04/21/2014 Discharge date: 04/24/2014  Recommendations for Outpatient Follow-up:  1. Pt will need to follow up with PCP in 2 weeks post discharge 2. Please obtain BMP  And LFTs in one week  Discharge Diagnoses:  Sepsis -Source is urine -Patient has had a history of Pseudomonas and Klebsiella UTIs -Most recently, the patient had Proteus mirabilis from his urine culture  -Chek procalcitonin--0.29 -IV fluids -Serial lactic acid-->1.1 -The patient received a dose of cefepime at the time of admission. This was changed to Zosyn the setting of his sepsis. -Continue Zosyn pending culture data -The patient received 3 days of gram-negative coverage, and he was transitioned to oral antibiotics after final susceptibility data was obtained. -d/c vanco -As discussed, the patient has an indwelling Foley catheter last changed on 04/20/2014 -doubt meningitis -INR and PTT unremarkable -await influenza PCR--neg Catheter associated UTI -culture = multidrug resistant proteus mirabilis--home with cefpodoxime 200mg  bid x 7 days  -low colony count due to preadmission abx -continued zosyn pending culture susceptibility -The patient received 3 days of gram-negative coverage, and he was transitioned to oral antibiotics after final susceptibility data was obtained. -The patient will go home with 7 additional days of antibiotics to finish 10 days of therapy -He will need to follow-up with his urologist, Dr. Era Bumpers for further antibiotic suppression prior to his scheduled procedure for his bladder in April Hyponatremia -Secondary to volume depletion -Continue IV fluids-->improving  Transaminasemia -likely due to sepsis -no abdominal pain or vomiting -RUQ U/S--neg GB -Hepatitis B surface antigen, hepatitis antibody--neg -d/c lipitor -His liver enzymes continued to improve  throughout the hospitalization -Recommend recheck LFTs in 1 week after discharge Hyponatremia -Still likely due to volume depletion -His sodium continued to improve with fluid resuscitation COPD -Stable presently on room air -Continue salmeterol neurogenic bladder with bladder stone  -Follows Dr. [name] -Surgical therapy scheduled on 05/14/2014  -Continue Flomax  -Continue finasteride  hypertension -hold losartan as BP is soft -We will not restart losartan at the time of discharge as his blood pressure remains well controlled off of antihypertensive therapy through the hospitalization Hyperlipidemia -hold statin due to LFTs -Follow-up with primary care provider before restarting -this was discussed with pt and daughter on day of d/c even though lipitor remains listed on the [facility]  Discharge Condition: stable  Disposition:      Follow-up Information    Follow up with [name], DO In 1 week.   Specialty:  Internal Medicine   Contact information:   [place] [id number] [phone]     home  Diet:regular Wt Readings from Last 3 Encounters:  04/23/14 87.6 kg (193 lb 2 oz)  03/08/14 78.472 kg (173 lb)  02/09/14 78.472 kg (173 lb)    History of present illness:  79 year old male with a history of hyperlipidemia, hypertension, COPD, neurogenic bladder, bladder stones, recurrent UTI presents with fevers, chills, rigors on the morning of 04/21/2014. The patient had been in his usual state of health on 04/20/2014. The patient recently moved from [place] to stay with his daughter here in [place]. Notably, the patient was admitted to the [facility] clinic in May 2015 with meningitis and Streptococcus pneumoniae bacteremia. 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 Since that period of time, the patient has had a neurogenic bladder requiring a Foley catheter. He has his Foley  catheter changed every 2  weeks. His last Foley catheter change was 04/20/2014. Because of his recurrent UTI, the patient has been on and off of Augmentin for nearly 1 year. However most recently the patient finished a 5 day course of levofloxacin on 04/19/2014. The patient then was started on Bactrim DS on 04/20/2014. Patient denies headache, chest pain, vomiting, diarrhea, abdominal pain, dysuria, hematuria, hematochezia, melena. The patient has some nausea. He always has some shortness of breath on exertion, but this has not been changed. The patient complains of neck pain, but states that this actually has been getting better.  In the emergency department, the patient was noted to have temperature 102.47F, heart rate 101, blood pressure 141/64, oxygen saturation 95% on room air. WBC was 11.3. Serum sodium was 123. Serum creatinine 1.13. Blood cultures and urine cultures were obtained. Lactic acid was 2.12. Urinalysis showed 11-20 WBCs. Chest x-ray negative for infiltrates. The patient was started on vancomycin and Zosyn pending culture data. He was fluid resuscitated. He remained hemodynamically stable. The patient gradually improved clinically. His fevers improved. Blood cultures were negative. Urine cultures grew Proteus mirabilis. The patient was transitioned to oral antibiotics-->cefpodoxime and sent home with 7 additional days of tx   Discharge Exam: Filed Vitals:   04/23/14 2111  BP: 99/41  Pulse: 68  Temp: 97.8 F (36.6 C)  Resp: 20   Filed Vitals:   04/24/14 0428 04/24/14 0532 04/24/14 0624 04/24/14 0718  BP:      Pulse:      Temp:      TempSrc:      Resp:      Height:      Weight:      SpO2: 93% 92% 92% 93%   General: awake and alert, NAD, pleasant, cooperative Cardiovascular: RRR, no rub, no gallop, no S3 Respiratory: Diminished breath sounds but clear to auscultation.  Abdomen:soft, nontender, nondistended, positive bowel sounds Extremities: No edema, No lymphangitis, no petechiae  Discharge  Instructions     Medication List    STOP taking these medications        amoxicillin-clavulanate 875-125 MG per tablet  Commonly known as:  AUGMENTIN     losartan 50 MG tablet  Commonly known as:  COZAAR     multivitamin with minerals tablet     sulfamethoxazole-trimethoprim 800-160 MG per tablet  Commonly known as:  BACTRIM DS,SEPTRA DS     trimethoprim 100 MG tablet  Commonly known as:  TRIMPEX      TAKE these medications        Acidophilus 100 MG Caps  Take 100 mg by mouth daily.     aspirin 81 MG chewable tablet  Take one tablet by mouth once daily     atorvastatin 20 MG tablet  Commonly known as:  LIPITOR  Take 1 tablet (20 mg total) by mouth daily at 6 PM.     calcium carbonate 1250 (500 CA) MG tablet  Commonly known as:  OS-CAL - dosed in mg of elemental calcium  Take 1 tablet by mouth daily with breakfast.     cefpodoxime 200 MG tablet  Commonly known as:  VANTIN  Take 1 tablet (200 mg total) by mouth every 12 (twelve) hours.     [employer] D3 5000 UNITS capsule  Generic drug:  Cholecalciferol  Take 5,000 Units by mouth every morning.     finasteride 5 MG tablet  Commonly known as:  PROSCAR  Take 1 tablet (5 mg total) by mouth daily.  Melatonin 3 MG Caps  Take 1 capsule by mouth at bedtime.     pantoprazole 40 MG tablet  Commonly known as:  PROTONIX  Take one tablet by mouth once daily for stomach     salmeterol 50 MCG/DOSE diskus inhaler  Commonly known as:  SEREVENT  Inhale 1 puff into the lungs 2 (two) times daily.     tamsulosin 0.4 MG Caps capsule  Commonly known as:  FLOMAX  Take one tablet by mouth once daily for prostate         The results of significant diagnostics from this hospitalization (including imaging, microbiology, ancillary and laboratory) are listed below for reference.    Significant Diagnostic Studies: Dg Chest Port 1 View  04/21/2014   CLINICAL DATA:  Increased fatigue following initiation of new medicine  EXAM:  PORTABLE CHEST - 1 VIEW  COMPARISON:  None.  FINDINGS: Cardiac shadow is within normal limits. The lungs are well aerated bilaterally. No focal infiltrate or sizable effusion is seen. No acute bony abnormality is noted.  IMPRESSION: No active disease.   Electronically Signed   By: Inez Catalina M.D.   On: 04/21/2014 16:19   US Abdomen Limited Ruq  04/22/2014   CLINICAL DATA:  Elevated liver transaminases. History of hypertension and a remote history of left testicular carcinoma.  EXAM: US ABDOMEN LIMITED - RIGHT UPPER QUADRANT  COMPARISON:  None.  FINDINGS: Gallbladder:  No gallstones or wall thickening visualized. No sonographic Murphy sign noted.  Common bile duct:  Diameter: 3 mm  Liver:  Liver shows a coarsened and mildly increased diffuse echotexture. No liver mass or focal lesion. Hepatopetal flow documented in the portal vein.  IMPRESSION: 1. No acute findings.  Normal gallbladder.  No bile duct dilation. 2. Coarse increased liver echogenicity suggests hepatic steatosis. No liver mass or focal lesion.   Electronically Signed   By: Lajean Manes M.D.   On: 04/22/2014 15:17     Microbiology: Recent Results (from the past 240 hour(s))  Blood culture (routine x 2)     Status: None (Preliminary result)   Collection Time: 04/21/14  3:19 PM  Result Value Ref Range Status   Specimen Description BLOOD RIGHT ANTECUBITAL  Final   Special Requests BOTTLES DRAWN AEROBIC AND ANAEROBIC 5 CC EACH  Final   Culture   Final           BLOOD CULTURE RECEIVED NO GROWTH TO DATE CULTURE WILL BE HELD FOR 5 DAYS BEFORE ISSUING A FINAL NEGATIVE REPORT Performed at [facility]    Report Status PENDING  Incomplete  Urine culture     Status: None   Collection Time: 04/21/14  3:25 PM  Result Value Ref Range Status   Specimen Description URINE, CATHETERIZED  Final   Special Requests NONE  Final   Colony Count   Final    50,000 COLONIES/ML Performed at [facility]    Culture   Final    PROTEUS  MIRABILIS Performed at [facility]    Report Status 04/24/2014 FINAL  Final   Organism ID, Bacteria PROTEUS MIRABILIS  Final      Susceptibility   Proteus mirabilis - MIC*    AMPICILLIN >=32 RESISTANT Resistant     CEFAZOLIN <=4 SENSITIVE Sensitive     CEFTRIAXONE <=1 SENSITIVE Sensitive     CIPROFLOXACIN >=4 RESISTANT Resistant     GENTAMICIN <=1 SENSITIVE Sensitive     LEVOFLOXACIN >=8 RESISTANT Resistant     NITROFURANTOIN 256  RESISTANT Resistant     TOBRAMYCIN <=1 SENSITIVE Sensitive     TRIMETH/SULFA >=320 RESISTANT Resistant     PIP/TAZO <=4 SENSITIVE Sensitive     * PROTEUS MIRABILIS  Blood culture (routine x 2)     Status: None (Preliminary result)   Collection Time: 04/21/14  3:26 PM  Result Value Ref Range Status   Specimen Description BLOOD BLOOD LEFT FOREARM  Final   Special Requests NONE  Final   Culture   Final           BLOOD CULTURE RECEIVED NO GROWTH TO DATE CULTURE WILL BE HELD FOR 5 DAYS BEFORE ISSUING A FINAL NEGATIVE REPORT Performed at [facility]    Report Status PENDING  Incomplete     Labs: Basic Metabolic Panel:  Recent Labs Lab 04/21/14 1518 04/22/14 0559 04/23/14 0505 04/24/14 0530  NA 123* 129* 127* 130*  K 4.0 4.1 3.4* 3.5  CL 89* 99 99 99  CO2 23 19 20 21   GLUCOSE 113* 123* 94 86  BUN 16 12 12 10   CREATININE 1.13 1.06 1.04 0.97  CALCIUM 8.4 8.3* 7.9* 7.8*   Liver Function Tests:  Recent Labs Lab 04/21/14 1518 04/22/14 0559 04/23/14 0505 04/24/14 0530  AST 230* 312* 112* 46*  ALT 193* 366* 223* 149*  ALKPHOS 103 136* 154* 162*  BILITOT 0.8 1.3* 0.8 0.7  PROT 6.7 5.5* 5.2* 5.7*  ALBUMIN 3.9 3.2* 2.9* 3.1*   No results for input(s): LIPASE, AMYLASE in the last 168 hours. No results for input(s): AMMONIA in the last 168 hours. CBC:  Recent Labs Lab 04/21/14 1518 04/22/14 0559 04/23/14 0505  WBC 11.3* 6.8 5.6  NEUTROABS 9.9*  --   --   HGB 11.7* 10.9* 9.4*  HCT 33.0* 31.1* 27.1*  MCV 89.4 88.1 88.6   PLT 280 199 201   Cardiac Enzymes: No results for input(s): CKTOTAL, CKMB, CKMBINDEX, TROPONINI in the last 168 hours. BNP: Invalid input(s): POCBNP CBG: No results for input(s): GLUCAP in the last 168 hours.  Time coordinating discharge:  Greater than 30 minutes  Signed:  Janie Strothman, DO [facility] Pager: [phone] 04/24/2014, 12:28 PM

## 2014-04-27 LAB — CULTURE, BLOOD (ROUTINE X 2)
CULTURE: NO GROWTH
Culture: NO GROWTH

## 2014-05-02 ENCOUNTER — Telehealth: Payer: Self-pay

## 2014-05-02 NOTE — Telephone Encounter (Signed)
Left message on voicemail for patient to return call when available   

## 2014-05-02 NOTE — Telephone Encounter (Signed)
RX was already sent by Dr.Tannebaum

## 2014-05-02 NOTE — Telephone Encounter (Signed)
Patient was seen in the hospital, DX with sepsis. Patients daughter feels patient needs another round of antibiotics. Patient with pending appointment on 05/11/14 with Dr.Carter.  Dr.Carter is out of office today, please advise on request

## 2014-05-02 NOTE — Telephone Encounter (Signed)
Prescribe cephalexin 500 mg (dispense 30) take one 3 times daily for infection.

## 2014-05-02 NOTE — Telephone Encounter (Signed)
Patient's daughter called back, spoke with urologist. Urologist ( Dr.Tannebaum) will address patients concern. Dr.Tannebaum needs to speak directly with Dr.Carter.   I called Patient's daughter [name]) and instructed her that Dr.Carter is out of office until next week, I asked if its ok for another doctor to call Dr.Tannebaum on Dr.Carte's behalf. [name] requested that Dr.Carter call when she is in office next week.   Dr.Tannebaum's Number: [phone]

## 2014-05-08 ENCOUNTER — Encounter (HOSPITAL_COMMUNITY): Payer: Self-pay | Admitting: *Deleted

## 2014-05-09 ENCOUNTER — Encounter (HOSPITAL_COMMUNITY): Payer: Self-pay | Admitting: *Deleted

## 2014-05-11 ENCOUNTER — Ambulatory Visit (INDEPENDENT_AMBULATORY_CARE_PROVIDER_SITE_OTHER): Payer: Medicare HMO | Admitting: Internal Medicine

## 2014-05-11 ENCOUNTER — Encounter: Payer: Self-pay | Admitting: Internal Medicine

## 2014-05-11 VITALS — BP 130/82 | HR 83 | Temp 97.8°F | Resp 20 | Ht 73.0 in | Wt 188.8 lb

## 2014-05-11 DIAGNOSIS — N390 Urinary tract infection, site not specified: Secondary | ICD-10-CM | POA: Diagnosis not present

## 2014-05-11 DIAGNOSIS — I10 Essential (primary) hypertension: Secondary | ICD-10-CM

## 2014-05-11 DIAGNOSIS — R945 Abnormal results of liver function studies: Secondary | ICD-10-CM

## 2014-05-11 DIAGNOSIS — N319 Neuromuscular dysfunction of bladder, unspecified: Secondary | ICD-10-CM | POA: Diagnosis not present

## 2014-05-11 DIAGNOSIS — R7989 Other specified abnormal findings of blood chemistry: Secondary | ICD-10-CM

## 2014-05-11 DIAGNOSIS — H543 Unqualified visual loss, both eyes: Secondary | ICD-10-CM

## 2014-05-11 DIAGNOSIS — E785 Hyperlipidemia, unspecified: Secondary | ICD-10-CM

## 2014-05-11 DIAGNOSIS — N644 Mastodynia: Secondary | ICD-10-CM | POA: Diagnosis not present

## 2014-05-11 NOTE — Patient Instructions (Signed)
Follow up in Sept for CPE  Continue current medications as ordered. Hold lipitor until repeat liver enzymes normal and lipid panel next month  Follow up with urology for surgery next week.  Will call with eye appointment and lab results

## 2014-05-11 NOTE — Progress Notes (Signed)
Patient ID: Ricardo [name], male   DOB: [birth year], 79 y.o.   MRN: [id number]    Facility  [facility]    Place of Service:   OFFICE   Allergies  Allergen Reactions  . Peanut Oil Shortness Of Breath and Swelling    Itching  . Peanut-Containing Drug Products Shortness Of Breath and Swelling    Chief Complaint  Patient presents with  . Hospitalization Follow-up    HPI:  79 yo male seen today for hospital f/u. He had sepsis and was tx with IV Zosyn and vanco. He saw urology inpt and is scheduled for sx next week to have suprapubic cath and removal of bladder stone. He completed UTI tx but req'd additional tx with vantin which he will take until his procedure next week. Never began keflex Rx Dr Nyoka Cowden.   Right nipple pain improved after d/c statin and losartan.  He needs referral to see eye specialist for OS blindness and reduced vision OU.   Past Medical History  Diagnosis Date  . Rotator cuff tear     right  . Hypertension   . High cholesterol   . Meningitis   . Enlarged prostate   . 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 Deaf     due to meningitis  . Loss of balance     due to meningitis  . Unable to walk     due to meningitis  . Eczema   . 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 Skin cancer   . Neurogenic bladder     History of   Past Surgical History  Procedure Laterality Date  . Appendectomy    . Prostate surgery      biopsy x 2  . Tonsillectomy      childhood  . Testicle removal Left     October 1956  . Cochlear implant     History   Social History  . Marital Status: Widowed    Spouse Name: N/A  . Number of Children: N/A  . Years of Education: N/A   Social History Main Topics  . Smoking status: Former Smoker -- 10 years    Types: Cigarettes    Start date: 02/10/1955    Quit date: 02/09/1965  . Smokeless tobacco: Never Used  . Alcohol Use: No  . Drug Use: No  . Sexual Activity: Not on file   Other Topics Concern  . None   Social History Narrative   Diet-  Fairly balanced   Caffeine- Yes, Coffee   Married- 1964, [place] living now with daughter, all on the first floor   Pets-4, dog and Radiation protection practitioner, English as a second language teacher for [employer], Chief Financial Officer, Freight forwarder   Exercise- Yes, Rehab 3 times a week   Living will- Yes   DNR- Yes   POA/HPOA- Yes          Medications: Patient's Medications  New Prescriptions   No medications on file  Previous Medications   ASPIRIN 81 MG CHEWABLE TABLET    Take one tablet by mouth once daily   CALCIUM 250 MG CAPS    Take 1 capsule by mouth daily.   CEFPODOXIME (VANTIN) 200 MG TABLET    Take 1 tablet (200 mg total) by mouth every 12 (twelve) hours.   CHOLECALCIFEROL ([employer] D3) 5000 UNITS CAPSULE    Take 5,000 Units by mouth every morning.    FINASTERIDE (PROSCAR) 5 MG TABLET    Take  1 tablet (5 mg total) by mouth daily.   LACTOBACILLUS (ACIDOPHILUS) 100 MG CAPS    Take 100 mg by mouth daily.   MELATONIN 5 MG TABS    Take 1 tablet by mouth at bedtime.   MULTIPLE VITAMIN (MULTIVITAMIN WITH MINERALS) TABS TABLET    Take 1 tablet by mouth daily.   PANTOPRAZOLE (PROTONIX) 40 MG TABLET    Take one tablet by mouth once daily for stomach   SALMETEROL (SEREVENT) 50 MCG/DOSE DISKUS INHALER    Inhale 1 puff into the lungs 2 (two) times daily.   TAMSULOSIN (FLOMAX) 0.4 MG CAPS CAPSULE    Take one tablet by mouth once daily for prostate   TRIMETHOPRIM (TRIMPEX) 100 MG TABLET    Take 100 mg by mouth every morning.  Modified Medications   No medications on file  Discontinued Medications   ATORVASTATIN (LIPITOR) 20 MG TABLET    Take 1 tablet (20 mg total) by mouth daily at 6 PM.     Review of Systems  Constitutional: Negative for chills, activity change, fatigue and unexpected weight change.  HENT: Positive for hearing loss. Negative for sore throat and trouble swallowing.   Eyes: Positive for visual disturbance.  Respiratory: Negative for cough, chest tightness and shortness of breath.     Cardiovascular: Negative for chest pain, palpitations and leg swelling.  Gastrointestinal: Negative for nausea, vomiting, abdominal pain and blood in stool.  Genitourinary: Positive for difficulty urinating (has chronic foley cath). Negative for dysuria, urgency, frequency, hematuria and testicular pain.  Musculoskeletal: Positive for arthralgias and gait problem.  Skin: Negative for rash.  Neurological: Negative for weakness and headaches.  Psychiatric/Behavioral: Negative for confusion and sleep disturbance. The patient is not nervous/anxious.     Filed Vitals:   05/11/14 1048  BP: 130/82  Pulse: 83  Temp: 97.8 F (36.6 C)  TempSrc: Oral  Resp: 20  Height: _0  (1.854 m)  Weight: 188 lb 12.8 oz (85.639 kg)  SpO2: 94%   Body mass index is 24.91 kg/(m^2).  Physical Exam  Constitutional: He is oriented to person, place, and time. He appears well-developed and well-nourished.  Sitting in a w/c  HENT:  Mouth/Throat: Oropharynx is clear and moist.  Eyes: Pupils are equal, round, and reactive to light. No scleral icterus.  Neck: Neck supple. No thyromegaly present.  Cardiovascular: Normal rate, regular rhythm, normal heart sounds and intact distal pulses.  Exam reveals no gallop and no friction rub.   No murmur heard. No carotid bruit b/l; no distal LE swelling  Pulmonary/Chest: Effort normal and breath sounds normal. He has no wheezes. He has no rales. He exhibits no tenderness.  Abdominal: Soft. Bowel sounds are normal. He exhibits no distension, no abdominal bruit, no pulsatile midline mass and no mass. There is no tenderness. There is no rebound and no guarding.  Genitourinary:  Foley intact and DTG with clear yellow urine  Musculoskeletal: He exhibits edema and tenderness.  Lymphadenopathy:    He has no cervical adenopathy.  Neurological: He is alert and oriented to person, place, and time. He has normal reflexes.  Skin: Skin is warm and dry. No rash noted.  Psychiatric:  He has a normal mood and affect. His behavior is normal. Judgment and thought content normal.     Labs reviewed: Admission on 04/21/2014, Discharged on 04/24/2014  Component Date Value Ref Range Status  . Lactic Acid, Venous 04/21/2014 2.12* 0.5 - 2.0 mmol/L Final  . Comment 04/21/2014 NOTIFIED PHYSICIAN  Final  . WBC 04/21/2014 11.3* 4.0 - 10.5 K/uL Final  . RBC 04/21/2014 3.69* 4.22 - 5.81 MIL/uL Final  . Hemoglobin 04/21/2014 11.7* 13.0 - 17.0 g/dL Final  . HCT 04/21/2014 33.0* 39.0 - 52.0 % Final  . MCV 04/21/2014 89.4  78.0 - 100.0 fL Final  . MCH 04/21/2014 31.7  26.0 - 34.0 pg Final  . MCHC 04/21/2014 35.5  30.0 - 36.0 g/dL Final  . RDW 04/21/2014 12.6  11.5 - 15.5 % Final  . Platelets 04/21/2014 280  150 - 400 K/uL Final  . Neutrophils Relative % 04/21/2014 87* 43 - 77 % Final  . Neutro Abs 04/21/2014 9.9* 1.7 - 7.7 K/uL Final  . Lymphocytes Relative 04/21/2014 4* 12 - 46 % Final  . Lymphs Abs 04/21/2014 0.4* 0.7 - 4.0 K/uL Final  . Monocytes Relative 04/21/2014 9  3 - 12 % Final  . Monocytes Absolute 04/21/2014 1.1* 0.1 - 1.0 K/uL Final  . Eosinophils Relative 04/21/2014 0  0 - 5 % Final  . Eosinophils Absolute 04/21/2014 0.0  0.0 - 0.7 K/uL Final  . Basophils Relative 04/21/2014 0  0 - 1 % Final  . Basophils Absolute 04/21/2014 0.0  0.0 - 0.1 K/uL Final  . Sodium 04/21/2014 123* 135 - 145 mmol/L Final  . Potassium 04/21/2014 4.0  3.5 - 5.1 mmol/L Final  . Chloride 04/21/2014 89* 96 - 112 mmol/L Final  . CO2 04/21/2014 23  19 - 32 mmol/L Final  . Glucose, Bld 04/21/2014 113* 70 - 99 mg/dL Final  . BUN 04/21/2014 16  6 - 23 mg/dL Final  . Creatinine, Ser 04/21/2014 1.13  0.50 - 1.35 mg/dL Final  . Calcium 04/21/2014 8.4  8.4 - 10.5 mg/dL Final  . Total Protein 04/21/2014 6.7  6.0 - 8.3 g/dL Final  . Albumin 04/21/2014 3.9  3.5 - 5.2 g/dL Final  . AST 04/21/2014 230* 0 - 37 U/L Final  . ALT 04/21/2014 193* 0 - 53 U/L Final  . Alkaline Phosphatase 04/21/2014 103  39 -  117 U/L Final  . Total Bilirubin 04/21/2014 0.8  0.3 - 1.2 mg/dL Final  . GFR calc non Af Amer 04/21/2014 61* >90 mL/min Final  . GFR calc Af Amer 04/21/2014 71* >90 mL/min Final   Comment: (NOTE) The eGFR has been calculated using the CKD EPI equation. This calculation has not been validated in all clinical situations. eGFR's persistently <90 mL/min signify possible Chronic Kidney Disease.   . Anion gap 04/21/2014 11  5 - 15 Final  . Specimen Description 04/21/2014 BLOOD BLOOD LEFT FOREARM   Final  . Special Requests 04/21/2014 NONE   Final  . Culture 04/21/2014    Final                   Value:NO GROWTH 5 DAYS Performed at [facility]   . Report Status 04/21/2014 04/27/2014 FINAL   Final  . Specimen Description 04/21/2014 BLOOD RIGHT ANTECUBITAL   Final  . Special Requests 04/21/2014 BOTTLES DRAWN AEROBIC AND ANAEROBIC 5 CC EACH   Final  . Culture 04/21/2014    Final                   Value:NO GROWTH 5 DAYS Performed at [facility]   . Report Status 04/21/2014 04/27/2014 FINAL   Final  . Color, Urine 04/21/2014 YELLOW  YELLOW Final  . APPearance 04/21/2014 CLEAR  CLEAR Final  . Specific Gravity, Urine 04/21/2014 1.012  1.005 - 1.030 Final  . pH 04/21/2014 7.0  5.0 - 8.0 Final  . Glucose, UA 04/21/2014 NEGATIVE  NEGATIVE mg/dL Final  . Hgb urine dipstick 04/21/2014 SMALL* NEGATIVE Final  . Bilirubin Urine 04/21/2014 NEGATIVE  NEGATIVE Final  . Ketones, ur 04/21/2014 NEGATIVE  NEGATIVE mg/dL Final  . Protein, ur 04/21/2014 NEGATIVE  NEGATIVE mg/dL Final  . Urobilinogen, UA 04/21/2014 0.2  0.0 - 1.0 mg/dL Final  . Nitrite 04/21/2014 NEGATIVE  NEGATIVE Final  . Leukocytes, UA 04/21/2014 MODERATE* NEGATIVE Final  . Specimen Description 04/21/2014 URINE, CATHETERIZED   Final  . Special Requests 04/21/2014 NONE   Final  . Colony Count 04/21/2014    Final                   Value:50,000 COLONIES/ML Performed at [facility]   . Culture 04/21/2014    Final                    Value:PROTEUS MIRABILIS Performed at [facility]   . Report Status 04/21/2014 04/24/2014 FINAL   Final  . Organism ID, Bacteria 04/21/2014 PROTEUS MIRABILIS   Final  . Influenza A By PCR 04/22/2014 NEGATIVE  NEGATIVE Final  . Influenza B By PCR 04/22/2014 NEGATIVE  NEGATIVE Final  . H1N1 flu by pcr 04/22/2014 NOT DETECTED  NOT DETECTED Final   Comment:        The Xpert Flu assay (FDA approved for nasal aspirates or washes and nasopharyngeal swab specimens), is intended as an aid in the diagnosis of influenza and should not be used as a sole basis for treatment. Performed at [facility]   . Squamous Epithelial / LPF 04/21/2014 RARE  RARE Final  . WBC, UA 04/21/2014 11-20  <3 WBC/hpf Final  . RBC / HPF 04/21/2014 3-6  <3 RBC/hpf Final  . Sodium 04/22/2014 129* 135 - 145 mmol/L Final  . Potassium 04/22/2014 4.1  3.5 - 5.1 mmol/L Final  . Chloride 04/22/2014 99  96 - 112 mmol/L Final   Comment: RESULT REPEATED AND VERIFIED DELTA CHECK NOTED   . CO2 04/22/2014 19  19 - 32 mmol/L Final  . Glucose, Bld 04/22/2014 123* 70 - 99 mg/dL Final  . BUN 04/22/2014 12  6 - 23 mg/dL Final  . Creatinine, Ser 04/22/2014 1.06  0.50 - 1.35 mg/dL Final  . Calcium 04/22/2014 8.3* 8.4 - 10.5 mg/dL Final  . GFR calc non Af Amer 04/22/2014 66* >90 mL/min Final  . GFR calc Af Amer 04/22/2014 77* >90 mL/min Final   Comment: (NOTE) The eGFR has been calculated using the CKD EPI equation. This calculation has not been validated in all clinical situations. eGFR's persistently <90 mL/min signify possible Chronic Kidney Disease.   . Anion gap 04/22/2014 11  5 - 15 Final  . WBC 04/22/2014 6.8  4.0 - 10.5 K/uL Final  . RBC 04/22/2014 3.53* 4.22 - 5.81 MIL/uL Final  . Hemoglobin 04/22/2014 10.9* 13.0 - 17.0 g/dL Final  . HCT 04/22/2014 31.1* 39.0 - 52.0 % Final  . MCV 04/22/2014 88.1  78.0 - 100.0 fL Final  . MCH 04/22/2014 30.9  26.0 - 34.0 pg Final  . MCHC 04/22/2014  35.0  30.0 - 36.0 g/dL Final  . RDW 04/22/2014 12.7  11.5 - 15.5 % Final  . Platelets 04/22/2014 199  150 - 400 K/uL Final   Comment: SPECIMEN CHECKED FOR CLOTS DELTA CHECK NOTED QUANTITY NOT SUFFICIENT TO REPEAT TEST   .  Lactic Acid, Venous 04/21/2014 1.1  0.5 - 2.0 mmol/L Final  . Lactic Acid, Venous 04/21/2014 1.1  0.5 - 2.0 mmol/L Final  . Procalcitonin 04/21/2014 0.29   Final   Comment:        Interpretation: PCT (Procalcitonin) <= 0.5 ng/mL: Systemic infection (sepsis) is not likely. Local bacterial infection is possible. (NOTE)         ICU PCT Algorithm               Non ICU PCT Algorithm    ----------------------------     ------------------------------         PCT < 0.25 ng/mL                 PCT < 0.1 ng/mL     Stopping of antibiotics            Stopping of antibiotics       strongly encouraged.               strongly encouraged.    ----------------------------     ------------------------------       PCT level decrease by               PCT < 0.25 ng/mL       >= 80% from peak PCT       OR PCT 0.25 - 0.5 ng/mL          Stopping of antibiotics                                             encouraged.     Stopping of antibiotics           encouraged.    ----------------------------     ------------------------------       PCT level decrease by              PCT >= 0.25 ng/mL       < 80% from peak PCT        AND PCT >= 0.5 ng/mL            Continuin                          g antibiotics                                              encouraged.       Continuing antibiotics            encouraged.    ----------------------------     ------------------------------     PCT level increase compared          PCT > 0.5 ng/mL         with peak PCT AND          PCT >= 0.5 ng/mL             Escalation of antibiotics                                          strongly encouraged.      Escalation of antibiotics  strongly encouraged.   . Prothrombin Time 04/21/2014 15.2  11.6 -  15.2 seconds Final  . INR 04/21/2014 1.19  0.00 - 1.49 Final  . aPTT 04/21/2014 39* 24 - 37 seconds Final   Comment:        IF BASELINE aPTT IS ELEVATED, SUGGEST PATIENT RISK ASSESSMENT BE USED TO DETERMINE APPROPRIATE ANTICOAGULANT THERAPY.   . Total Protein 04/22/2014 5.5* 6.0 - 8.3 g/dL Final  . Albumin 04/22/2014 3.2* 3.5 - 5.2 g/dL Final  . AST 04/22/2014 312* 0 - 37 U/L Final  . ALT 04/22/2014 366* 0 - 53 U/L Final  . Alkaline Phosphatase 04/22/2014 136* 39 - 117 U/L Final  . Total Bilirubin 04/22/2014 1.3* 0.3 - 1.2 mg/dL Final  . Bilirubin, Direct 04/22/2014 0.5  0.0 - 0.5 mg/dL Final  . Indirect Bilirubin 04/22/2014 0.8  0.3 - 0.9 mg/dL Final  . Hepatitis B Surface Ag 04/22/2014 NEGATIVE  NEGATIVE Final   Performed at [facility]  . HCV Ab 04/22/2014 NEGATIVE  NEGATIVE Final   Performed at [facility]  . Hgb A1c MFr Bld 04/22/2014 6.0* 4.8 - 5.6 % Final   Comment: (NOTE)         Pre-diabetes: 5.7 - 6.4         Diabetes: >6.4         Glycemic control for adults with diabetes: <7.0   . Mean Plasma Glucose 04/22/2014 126   Final   Comment: (NOTE) Performed At: [facility] [place], [place] [id number] Lindon Romp MD TD:[id number]   . Sodium 04/23/2014 127* 135 - 145 mmol/L Final  . Potassium 04/23/2014 3.4* 3.5 - 5.1 mmol/L Final   Comment: DELTA CHECK NOTED REPEATED TO VERIFY   . Chloride 04/23/2014 99  96 - 112 mmol/L Final  . CO2 04/23/2014 20  19 - 32 mmol/L Final  . Glucose, Bld 04/23/2014 94  70 - 99 mg/dL Final  . BUN 04/23/2014 12  6 - 23 mg/dL Final  . Creatinine, Ser 04/23/2014 1.04  0.50 - 1.35 mg/dL Final  . Calcium 04/23/2014 7.9* 8.4 - 10.5 mg/dL Final  . Total Protein 04/23/2014 5.2* 6.0 - 8.3 g/dL Final  . Albumin 04/23/2014 2.9* 3.5 - 5.2 g/dL Final  . AST 04/23/2014 112* 0 - 37 U/L Final  . ALT 04/23/2014 223* 0 - 53 U/L Final  . Alkaline Phosphatase 04/23/2014 154* 39 - 117 U/L Final  . Total  Bilirubin 04/23/2014 0.8  0.3 - 1.2 mg/dL Final  . GFR calc non Af Amer 04/23/2014 68* >90 mL/min Final  . GFR calc Af Amer 04/23/2014 78* >90 mL/min Final   Comment: (NOTE) The eGFR has been calculated using the CKD EPI equation. This calculation has not been validated in all clinical situations. eGFR's persistently <90 mL/min signify possible Chronic Kidney Disease.   . Anion gap 04/23/2014 8  5 - 15 Final  . WBC 04/23/2014 5.6  4.0 - 10.5 K/uL Final  . RBC 04/23/2014 3.06* 4.22 - 5.81 MIL/uL Final  . Hemoglobin 04/23/2014 9.4* 13.0 - 17.0 g/dL Final  . HCT 04/23/2014 27.1* 39.0 - 52.0 % Final  . MCV 04/23/2014 88.6  78.0 - 100.0 fL Final  . MCH 04/23/2014 30.7  26.0 - 34.0 pg Final  . MCHC 04/23/2014 34.7  30.0 - 36.0 g/dL Final  . RDW 04/23/2014 12.8  11.5 - 15.5 % Final  . Platelets 04/23/2014 201  150 - 400 K/uL Final  . Osmolality, Ur  04/23/2014 245* 390 - 1090 mOsm/kg Final   Performed at [facility]  . Sodium, Ur 04/23/2014 55   Final   Performed at [facility]  . Creatinine, Urine 04/23/2014 35.98   Final   Performed at [facility]  . Osmolality 04/23/2014 264* 275 - 300 mOsm/kg Final   Performed at [facility]  . Sodium 04/24/2014 130* 135 - 145 mmol/L Final  . Potassium 04/24/2014 3.5  3.5 - 5.1 mmol/L Final  . Chloride 04/24/2014 99  96 - 112 mmol/L Final  . CO2 04/24/2014 21  19 - 32 mmol/L Final  . Glucose, Bld 04/24/2014 86  70 - 99 mg/dL Final  . BUN 04/24/2014 10  6 - 23 mg/dL Final  . Creatinine, Ser 04/24/2014 0.97  0.50 - 1.35 mg/dL Final  . Calcium 04/24/2014 7.8* 8.4 - 10.5 mg/dL Final  . Total Protein 04/24/2014 5.7* 6.0 - 8.3 g/dL Final  . Albumin 04/24/2014 3.1* 3.5 - 5.2 g/dL Final  . AST 04/24/2014 46* 0 - 37 U/L Final  . ALT 04/24/2014 149* 0 - 53 U/L Final  . Alkaline Phosphatase 04/24/2014 162* 39 - 117 U/L Final  . Total Bilirubin 04/24/2014 0.7  0.3 - 1.2 mg/dL Final  . GFR calc non Af Amer 04/24/2014 78* >90  mL/min Final  . GFR calc Af Amer 04/24/2014 >90  >90 mL/min Final   Comment: (NOTE) The eGFR has been calculated using the CKD EPI equation. This calculation has not been validated in all clinical situations. eGFR's persistently <90 mL/min signify possible Chronic Kidney Disease.   . Anion gap 04/24/2014 10  5 - 15 Final  Orders Only on 02/20/2014  Component Date Value Ref Range Status  . Color, UA 02/20/2014 Yellow   Final  . Clarity, UA 02/20/2014 cloudy   Final  . Glucose, UA 02/20/2014 Negative   Final  . Bilirubin, UA 02/20/2014 Negative   Final  . Ketones, UA 02/20/2014 negative   Final  . Spec Grav, UA 02/20/2014 <=1.005   Final  . Blood, UA 02/20/2014 trace   Final  . pH, UA 02/20/2014 6.5   Final  . Protein, UA 02/20/2014 Negative   Final  . Urobilinogen, UA 02/20/2014 negative   Final  . Nitrite, UA 02/20/2014 negative   Final  . Leukocytes, UA 02/20/2014 Negative   Final   Hospital records reviewed- RUQ US revealed no gallbladder disease.LFTs improved during hospital stay. UTI was multi-drug resistant Proteus mirablis  Assessment/Plan    ICD-9-CM ICD-10-CM   1. Impaired vision in both eyes - 369.3 H54.3 Ambulatory referral to Ophthalmology   with OS blindness  2. Recurrent UTI with chronic foley cath due to #4 599.0 N39.0   3. Breast pain, right - resolved 611.71 N64.4   4. Neurogenic bladder - (+) association with #2 596.54 N31.9   5. Essential hypertension - stable 401.9 I10   6. Hyperlipidemia - stable 272.4 E78.5 Lipid Panel  7. Abnormal LFTs - improving 790.6 R79.89 CMP   --reck labs as above to follow LFTs and lytes  --follow lipid panel closely as he is off statin at this time due to elevated LFTs. T/c resuming once levels are markedly improved  --f/u with urology for surgery as scheduled next week. He is medically cleared for procedure. Keep perioperative BP <140/90. Hold ASA at least 5 days prior to procedure. He stopped ASA 2 days ago.  --he plans to  travel out-of-state this summer. RTO in Sept 2016  for [employer]. [name]  [facility] [address] [place], [place] [address] [phone] Office (Wednesdays and Fridays 8 AM - 5 PM) [phone] Cell (Monday-Friday 8 AM - 5 PM)

## 2014-05-12 LAB — COMPREHENSIVE METABOLIC PANEL
ALT: 11 IU/L (ref 0–44)
AST: 15 IU/L (ref 0–40)
Albumin/Globulin Ratio: 1.9 (ref 1.1–2.5)
Albumin: 4.6 g/dL (ref 3.5–4.8)
Alkaline Phosphatase: 102 IU/L (ref 39–117)
BUN/Creatinine Ratio: 16 (ref 10–22)
BUN: 16 mg/dL (ref 8–27)
Bilirubin Total: 0.2 mg/dL (ref 0.0–1.2)
CO2: 23 mmol/L (ref 18–29)
CREATININE: 0.98 mg/dL (ref 0.76–1.27)
Calcium: 9.5 mg/dL (ref 8.6–10.2)
Chloride: 91 mmol/L — ABNORMAL LOW (ref 97–108)
GFR calc Af Amer: 86 mL/min/{1.73_m2} (ref >59)
GFR calc non Af Amer: 75 mL/min/{1.73_m2} (ref >59)
GLUCOSE: 111 mg/dL — AB (ref 65–99)
Globulin, Total: 2.4 g/dL (ref 1.5–4.5)
Potassium: 4.8 mmol/L (ref 3.5–5.2)
Sodium: 132 mmol/L — ABNORMAL LOW (ref 134–144)
Total Protein: 7 g/dL (ref 6.0–8.5)

## 2014-05-13 NOTE — H&P (Signed)
Reason For Visit F/u to discuss surgery   Active Problems Problems  1. Bladder calculus (N21.0)   Assessed By: [name] (Urology); Last Assessed: 18 Apr 2014 2. Chronic cystitis (N30.20)   Assessed By: [name] (Urology); Last Assessed: 18 Apr 2014 3. 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 Urinary tract infection due to Proteus (N39.0,B96.4)  History of Present Illness     79 yo widowed male, accompanied by his daughter, [name], his primary caregiver and primary historian. They return today to discuss surgery for hx of bladder stone.     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 He was visiting his son in [place], [place], and contracted bacterial meningitis, May, 2015. He was seen at the [facility], and had Foley catheter inserted. He was in a coma for 3 1/2 weeks, complicated by "mini-strokes". Urodynamics accomplished, but results unknown. He has been on trimethoprim suppression therapy, and currently is being treated with Augmentin for Proteus UTI. He has had chronic foley catheterization since his meningitis, because of repeated failed voiding trials, despite tamsulosin and finasteride. He has had several episodes of traumatic foley catheter removals, but is currently doing well with size 18 F catheter, with 30cc sterile water in the balloon. Catheter is now changed q 2 weeks per [place].    Note additional history:   1. HBP  2. 8+ UTI this year  3. elevated cholesterol  4. GERD  5. insomnia  6. Deaf in R ear; cochlear implant in L ear  7. 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 Blind L eye ( meningitis).   Past Medical History Problems  1. History of Anxiety (F41.9) 2. History of asthma (Z87.09) 3. History of blindness (Z86.69) 4. History of hypercholesterolemia (Z86.39) 5. History of hypertension (Z86.79) 6. History of meningitis (Z86.61) 7. History of rotator cuff tear (Z87.39) 8. History of stroke (Z86.73) 9. History of Testicle  cancer, left (C62.92)  Surgical History Problems  1. History of Appendectomy 2. History of Ear Surgery 3. History of Prostate Surgery 4. History of Surgery Of Male Genitalia Orchiectomy 5. History of Tonsillectomy  Current Meds 1. Advair Diskus 100-50 MCG/DOSE MISC;  Therapy: (Recorded:16Feb2016) to Recorded 2. Aspirin 81 MG Oral Tablet;  Therapy: (Recorded:16Feb2016) to Recorded 3. Atorvastatin Calcium TABS;  Therapy: (Recorded:16Feb2016) to Recorded 4. Calcium CAPS;  Therapy: (Recorded:16Feb2016) to Recorded 5. Finasteride TABS;  Therapy: (Recorded:16Feb2016) to Recorded 6. Levofloxacin 500 MG Oral Tablet; Take 1 tablet twice daily;  Therapy: [id number] to (Evaluate:12Mar2016)  Requested for: [id number]; Last  Rx:07Mar2016 Ordered 7. Losartan Potassium TABS;  Therapy: (Recorded:16Feb2016) to Recorded 8. Multi Vitamin/Minerals TABS;  Therapy: (Recorded:16Feb2016) to Recorded 9. Pantoprazole Sodium TBEC;  Therapy: (Recorded:16Feb2016) to Recorded 10. Probiotic Oral Capsule;   Therapy: (Recorded:16Mar2016) to Recorded 11. SMZ-TMP DS 800-160 MG Oral Tablet; Take 1 tablet twice daily;   Therapy: [id number] to (Evaluate:12Mar2016)  Requested for: [id number]; Last   Rx:07Mar2016 Ordered 12. Tamsulosin HCl - 0.4 MG Oral Capsule;   Therapy: (Recorded:16Feb2016) to Recorded 13. Trimethoprim-Sulfamethoxazole SUSP;   Therapy: (Recorded:16Feb2016) to Recorded 14. Tylenol TABS;   Therapy: (Recorded:16Feb2016) to Recorded 15. Vitamin D3 TABS;   Therapy: (Recorded:16Feb2016) to Recorded  Allergies Medication  1. No Known Drug Allergies  Family History Problems  1. Family history of hypertension (Z82.49) : Mother 2. Family history of Heart trouble : Father  Social History Problems  1. Denied: History of Alcohol use 2. Caffeine use (F15.90)   2-3 3. Former smoker [phone])   smoked 10 years quit 1966 4.  Retired 43. Two children   both adopted 6. Widowed  Review of  Systems Constitutional, skin, eye, otolaryngeal, hematologic/lymphatic, cardiovascular, pulmonary, endocrine, musculoskeletal, gastrointestinal and neurological system(s) were reviewed and pertinent findings if present are noted and are otherwise negative.  Genitourinary: difficulty starting the urinary stream, incomplete emptying of bladder and initiating urination requires straining, but no urinary frequency, no feelings of urinary urgency, no dysuria, no nocturia, no incontinence, urine stream is not weak, urinary stream does not start and stop, no post-void dribbling, no hematuria and no urethral discharge.  Gastrointestinal: constipation, but no nausea, no vomiting and no diarrhea.  Neurological: limb weakness and difficulty walking.  Psychiatric: sleep disturbances.    Vitals Vital Signs [Data Includes: Last 1 Day]  Recorded: [id number] 11:03AM  Blood Pressure: 121 / 67 Temperature: 97.7 F Heart Rate: 83  Physical Exam Constitutional: Well nourished and well developed . No acute distress. Wheelchair-bound.  ENT:. Hearing loss is noted.  Neck: The appearance of the neck is normal.  Pulmonary: No respiratory distress.  Cardiovascular:. No peripheral edema.  Abdomen: The abdomen is rounded. The abdomen is soft and nontender. No masses are palpated. No CVA tenderness. No hernias are palpable. No hepatosplenomegaly noted.  Genitourinary: Examination of the penis demonstrates no discharge, no masses, no lesions and a normal meatus. The scrotum is normal in appearance and without lesions. The right epididymis is palpably normal and non-tender. The left epididymis is palpably normal and non-tender. The right testis is palpably normal, non-tender and without masses. The left testis is absent, non-tender and without masses. Urethral erosion, 6 o'clock.  Lymphatics: The femoral and inguinal nodes are not enlarged or tender.  Skin: Normal skin turgor, no visible rash and no visible skin lesions.   Neuro/Psych:. Mood and affect are appropriate. The patient is hemipalegic.    Results/Data Selected Results  URINE CULTURE 26Feb2016 10:33AM Rosalyn Gess, Diane  SOURCE : CLEAN CATCH SPECIMEN TYPE: CLEAN CATCH   Test Name Result Flag Reference  CULTURE, URINE Culture, Urine    ===== COLONY COUNT: =====  >=100,000 COLONIES/ML   FINAL REPORT: PROTEUS MIRABILIS  STENOTROPHOMONAS MALTOPHILIA   SENSITIVITY FOR: PROTEUS MIRABILIS    AMPICILLIN                             RESISTANT       >=32    AMOX/CLAVULANIC                        SENSITIVE        <=2    AMPICILLIN/SUL                         SENSITIVE          8    PIPERACILLIN/TAZO                      SENSITIVE        <=4    IMIPENEM                               SENSITIVE          2    CEFAZOLIN                              SENSITIVE        <=  4    CEFTRIAXONE                            SENSITIVE        <=1    CEFTAZIDIME                            SENSITIVE        <=1    CEFEPIME                               SENSITIVE        <=1    GENTAMICIN                             SENSITIVE        <=1    TOBRAMYCIN                             SENSITIVE        <=1    CIPROFLOXACIN                          RESISTANT        >=4    LEVOFLOXACIN                           RESISTANT        >=8    NITROFURANTOIN                         RESISTANT        128    TRIMETH/SULFA                          RESISTANT      >=320    SENSITIVITY FOR: STENOTROPHOMONAS MALTOPHILIA    LEVOFLOXACIN                           SENSITIVE       0.25    TRIMETH/SULFA                          SENSITIVE       <=20    END OF REPORT   AU CT-STONE PROTOCOL 26Feb2016 12:00AM [name]   Test Name Result Flag Reference  CT-STONE PROTOCOL (Report)    ** RADIOLOGY REPORT BY [facility] **   CLINICAL DATA: Initial evaluation of 79 year old male with history of testicular cancer diagnosed in 1956 status post left orchiectomy. Recurrent urinary tract  infections.  EXAM: CT ABDOMEN AND PELVIS WITHOUT CONTRAST  TECHNIQUE: Multidetector CT imaging of the abdomen and pelvis was performed following the standard protocol without IV contrast.  COMPARISON: No priors.  FINDINGS: Lower chest: Mild scarring in the right lung base.  Hepatobiliary: No definite cystic or solid hepatic lesion identified on today's non contrast CT examination. The unenhanced appearance of the gallbladder is normal.  Pancreas: Unremarkable.  Spleen: Unremarkable.  Adrenals/Urinary Tract: No calcifications within the collecting system of either kidney or along the course of either ureter. In the in right-sided the urinary bladder posteriorly  there is a 6 x 8 x 13 mm calculus (image 55 of series 2). Urinary bladder is decompressed around an indwelling Foley catheter. Bilateral adrenal glands are normal in appearance.  Stomach/Bowel: The unenhanced appearance of the stomach is normal. No pathologic dilatation of small bowel or colon.  Vascular/Lymphatic: Mild atherosclerosis throughout the abdominal and pelvic vasculature, without definite aneurysm. No lymphadenopathy noted in the abdomen or pelvis on today's non contrast CT examination.  Reproductive: Prostate gland appears enlarged and heterogeneous measuring 4.3 x 6.8 cm. Seminal vesicles are unremarkable in appearance.  Other: No significant volume of ascites. No pneumoperitoneum.  Musculoskeletal: There are no aggressive appearing lytic or blastic lesions noted in the visualized portions of the skeleton.  IMPRESSION: 1. 6 x 8 x 13 mm calculus in the right side of the urinary bladder. No ureteral stones or findings of urinary tract obstruction are noted at this time. 2. Foley balloon catheter tip in the lumen of the urinary bladder which is completely decompressed. 3. No acute findings in the abdomen or pelvis on today's non contrast CT examination. 4. Atherosclerosis.   Electronically  Signed  By: Vinnie Langton M.D.  On: 03/30/2014 12:25   Assessment Assessed  1. Urinary tract infection due to Proteus (N39.0,B96.4) 2. 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 Chronic cystitis (N30.20) 4. Bladder calculus (N21.0)  Urethral erosion 2ndary to chronic foley catheterization, with bladder stone formation. He will need cysto and Holmium laser of bladder stone-and s-p tube insertion ( silastic) and ; eventual videourodynamics. ? change s-p tube in office vs have Gentiva change s-p tube .   Plan Chronic cystitis, Urinary tract infection due to Proteus  1. Start: Ellura 200 MG Oral Capsule; TAKE 1 CAPSULE Daily  1. cysto Holmium laser of bladder stone and placement of s-p tube.   2. eventual videourodynamics. Pt will be going to [facility] for summer  3. Ellura 1/day ,   4. continue low-dose daily antibiotic   Discussion/Summary cc: [name], [place].     Signatures Electronically signed by : [name], M.D.; Apr 18 2014 11:43AM EST

## 2014-05-14 ENCOUNTER — Encounter (HOSPITAL_COMMUNITY): Payer: Self-pay | Admitting: *Deleted

## 2014-05-14 ENCOUNTER — Ambulatory Visit (HOSPITAL_COMMUNITY): Payer: Medicare HMO | Admitting: Anesthesiology

## 2014-05-14 ENCOUNTER — Encounter (HOSPITAL_COMMUNITY)
Admission: RE | Disposition: A | Discharge status: Home / Self Care | Payer: Self-pay | Source: Ambulatory Visit | Attending: Urology

## 2014-05-14 ENCOUNTER — Ambulatory Visit (HOSPITAL_COMMUNITY)
Admission: RE | Admit: 2014-05-14 | Discharge: 2014-05-14 | Disposition: A | Discharge status: Home / Self Care | Payer: Medicare HMO | Source: Ambulatory Visit | Attending: Urology | Admitting: Urology

## 2014-05-14 DIAGNOSIS — N401 Enlarged prostate with lower urinary tract symptoms: Secondary | ICD-10-CM | POA: Insufficient documentation

## 2014-05-14 DIAGNOSIS — Z8673 Personal history of transient ischemic attack (TIA), and cerebral infarction without residual deficits: Secondary | ICD-10-CM | POA: Insufficient documentation

## 2014-05-14 DIAGNOSIS — J45909 Unspecified asthma, uncomplicated: Secondary | ICD-10-CM | POA: Insufficient documentation

## 2014-05-14 DIAGNOSIS — I10 Essential (primary) hypertension: Secondary | ICD-10-CM | POA: Insufficient documentation

## 2014-05-14 DIAGNOSIS — N390 Urinary tract infection, site not specified: Secondary | ICD-10-CM | POA: Diagnosis not present

## 2014-05-14 DIAGNOSIS — N210 Calculus in bladder: Secondary | ICD-10-CM | POA: Insufficient documentation

## 2014-05-14 DIAGNOSIS — N3020 Other chronic cystitis without hematuria: Secondary | ICD-10-CM | POA: Diagnosis not present

## 2014-05-14 DIAGNOSIS — B964 Proteus (mirabilis) (morganii) as the cause of diseases classified elsewhere: Secondary | ICD-10-CM | POA: Diagnosis not present

## 2014-05-14 DIAGNOSIS — N319 Neuromuscular dysfunction of bladder, unspecified: Secondary | ICD-10-CM | POA: Insufficient documentation

## 2014-05-14 DIAGNOSIS — J449 Chronic obstructive pulmonary disease, unspecified: Secondary | ICD-10-CM | POA: Diagnosis not present

## 2014-05-14 DIAGNOSIS — E780 Pure hypercholesterolemia: Secondary | ICD-10-CM | POA: Diagnosis not present

## 2014-05-14 DIAGNOSIS — K219 Gastro-esophageal reflux disease without esophagitis: Secondary | ICD-10-CM | POA: Diagnosis not present

## 2014-05-14 DIAGNOSIS — Z7982 Long term (current) use of aspirin: Secondary | ICD-10-CM | POA: Insufficient documentation

## 2014-05-14 DIAGNOSIS — Z8547 Personal history of malignant neoplasm of testis: Secondary | ICD-10-CM | POA: Insufficient documentation

## 2014-05-14 DIAGNOSIS — Z87891 Personal history of nicotine dependence: Secondary | ICD-10-CM | POA: Diagnosis not present

## 2014-05-14 DIAGNOSIS — Z9049 Acquired absence of other specified parts of digestive tract: Secondary | ICD-10-CM | POA: Diagnosis not present

## 2014-05-14 DIAGNOSIS — F419 Anxiety disorder, unspecified: Secondary | ICD-10-CM | POA: Insufficient documentation

## 2014-05-14 DIAGNOSIS — R339 Retention of urine, unspecified: Secondary | ICD-10-CM | POA: Diagnosis not present

## 2014-05-14 HISTORY — PX: CYSTOSCOPY WITH LITHOLAPAXY: SHX1425

## 2014-05-14 HISTORY — DX: Neuromuscular dysfunction of bladder, unspecified: N31.9

## 2014-05-14 HISTORY — PX: INSERTION OF SUPRAPUBIC CATHETER: SHX5870

## 2014-05-14 LAB — BASIC METABOLIC PANEL
ANION GAP: 9 (ref 5–15)
BUN: 17 mg/dL (ref 6–23)
CALCIUM: 9 mg/dL (ref 8.4–10.5)
CO2: 26 mmol/L (ref 19–32)
Chloride: 97 mmol/L (ref 96–112)
Creatinine, Ser: 1.36 mg/dL — ABNORMAL HIGH (ref 0.50–1.35)
GFR calc Af Amer: 57 mL/min — ABNORMAL LOW (ref >90)
GFR, EST NON AFRICAN AMERICAN: 49 mL/min — AB (ref >90)
Glucose, Bld: 123 mg/dL — ABNORMAL HIGH (ref 70–99)
Potassium: 4.5 mmol/L (ref 3.5–5.1)
Sodium: 132 mmol/L — ABNORMAL LOW (ref 135–145)

## 2014-05-14 LAB — CBC
HEMATOCRIT: 36.8 % — AB (ref 39.0–52.0)
HEMOGLOBIN: 12.4 g/dL — AB (ref 13.0–17.0)
MCH: 30.2 pg (ref 26.0–34.0)
MCHC: 33.7 g/dL (ref 30.0–36.0)
MCV: 89.5 fL (ref 78.0–100.0)
Platelets: 250 10*3/uL (ref 150–400)
RBC: 4.11 MIL/uL — AB (ref 4.22–5.81)
RDW: 12.7 % (ref 11.5–15.5)
WBC: 6 10*3/uL (ref 4.0–10.5)

## 2014-05-14 SURGERY — CYSTOSCOPY, WITH BLADDER CALCULUS LITHOLAPAXY
Anesthesia: General

## 2014-05-14 MED ORDER — PROPOFOL 10 MG/ML IV BOLUS
INTRAVENOUS | Status: AC
  Filled 2014-05-14: qty 20

## 2014-05-14 MED ORDER — FENTANYL CITRATE 0.05 MG/ML IJ SOLN
INTRAMUSCULAR | Status: DC | PRN
  Administered 2014-05-14 (×2): 25 ug via INTRAVENOUS

## 2014-05-14 MED ORDER — SODIUM CHLORIDE 0.9 % IJ SOLN
INTRAMUSCULAR | Status: AC
  Filled 2014-05-14: qty 10

## 2014-05-14 MED ORDER — PROMETHAZINE HCL 25 MG/ML IJ SOLN: 6.2500 mg | INTRAMUSCULAR | Status: DC | PRN

## 2014-05-14 MED ORDER — EPHEDRINE SULFATE 50 MG/ML IJ SOLN
INTRAMUSCULAR | Status: DC | PRN
  Administered 2014-05-14 (×2): 5 mg via INTRAVENOUS

## 2014-05-14 MED ORDER — HYDROMORPHONE HCL 1 MG/ML IJ SOLN
INTRAMUSCULAR | Status: AC
  Filled 2014-05-14: qty 1

## 2014-05-14 MED ORDER — SODIUM CHLORIDE 0.9 % IR SOLN
Status: DC | PRN
  Administered 2014-05-14: 6000 mL

## 2014-05-14 MED ORDER — PHENAZOPYRIDINE HCL 200 MG PO TABS
200.0000 mg | ORAL_TABLET | Freq: Once | ORAL | Status: AC
  Administered 2014-05-14: 200 mg via ORAL
  Filled 2014-05-14: qty 1

## 2014-05-14 MED ORDER — PROPOFOL 10 MG/ML IV EMUL
INTRAVENOUS | Status: DC | PRN
  Administered 2014-05-14: 50 mg via INTRAVENOUS
  Administered 2014-05-14: 120 mg via INTRAVENOUS

## 2014-05-14 MED ORDER — HYDROMORPHONE HCL 1 MG/ML IJ SOLN
0.5000 mg | Freq: Once | INTRAMUSCULAR | Status: AC
  Administered 2014-05-14: 1 mg via INTRAVENOUS

## 2014-05-14 MED ORDER — PHENAZOPYRIDINE HCL 200 MG PO TABS: 200.0000 mg | ORAL_TABLET | Freq: Three times a day (TID) | ORAL | Status: DC | PRN

## 2014-05-14 MED ORDER — STERILE WATER FOR IRRIGATION IR SOLN
Status: DC | PRN
  Administered 2014-05-14: 500 mL

## 2014-05-14 MED ORDER — LIDOCAINE HCL (CARDIAC) 20 MG/ML IV SOLN
INTRAVENOUS | Status: AC
  Filled 2014-05-14: qty 5

## 2014-05-14 MED ORDER — CEFAZOLIN SODIUM-DEXTROSE 2-3 GM-% IV SOLR
INTRAVENOUS | Status: AC
  Filled 2014-05-14: qty 50

## 2014-05-14 MED ORDER — EPHEDRINE SULFATE 50 MG/ML IJ SOLN
INTRAMUSCULAR | Status: AC
  Filled 2014-05-14: qty 1

## 2014-05-14 MED ORDER — FENTANYL CITRATE 0.05 MG/ML IJ SOLN: 25.0000 ug | INTRAMUSCULAR | Status: DC | PRN

## 2014-05-14 MED ORDER — CEFPODOXIME PROXETIL 100 MG PO TABS: 100.0000 mg | ORAL_TABLET | Freq: Two times a day (BID) | ORAL | Status: DC

## 2014-05-14 MED ORDER — ONDANSETRON HCL 4 MG/2ML IJ SOLN
INTRAMUSCULAR | Status: DC | PRN
  Administered 2014-05-14: 4 mg via INTRAVENOUS

## 2014-05-14 MED ORDER — LACTATED RINGERS IV SOLN
INTRAVENOUS | Status: DC | PRN
  Administered 2014-05-14: 09:00:00 via INTRAVENOUS

## 2014-05-14 MED ORDER — CEFAZOLIN SODIUM-DEXTROSE 2-3 GM-% IV SOLR
2.0000 g | INTRAVENOUS | Status: AC
  Administered 2014-05-14: 2 g via INTRAVENOUS

## 2014-05-14 MED ORDER — 0.9 % SODIUM CHLORIDE (POUR BTL) OPTIME
TOPICAL | Status: DC | PRN
  Administered 2014-05-14: 1000 mL

## 2014-05-14 MED ORDER — MEPERIDINE HCL 50 MG/ML IJ SOLN: 6.2500 mg | INTRAMUSCULAR | Status: DC | PRN

## 2014-05-14 MED ORDER — ONDANSETRON HCL 4 MG/2ML IJ SOLN
INTRAMUSCULAR | Status: AC
  Filled 2014-05-14: qty 2

## 2014-05-14 MED ORDER — FENTANYL CITRATE 0.05 MG/ML IJ SOLN
INTRAMUSCULAR | Status: AC
  Filled 2014-05-14: qty 2

## 2014-05-14 SURGICAL SUPPLY — 40 items
BAG URINE DRAINAGE (UROLOGICAL SUPPLIES) ×2 IMPLANT
BAG URINE LEG 500ML (DRAIN) ×2 IMPLANT
BAG URO CATCHER STRL LF (DRAPE) ×2 IMPLANT
BLADE SURG 15 STRL LF DISP TIS (BLADE) ×1 IMPLANT
BLADE SURG 15 STRL SS (BLADE) ×1
CATH SILASTIC FOLEY 18FRX5CC (CATHETERS) ×2 IMPLANT
CLOTH BEACON ORANGE TIMEOUT ST (SAFETY) ×2 IMPLANT
COUNTER NEEDLE 20 DBL MAG RED (NEEDLE) ×2 IMPLANT
ELECT REM PT RETURN 9FT ADLT (ELECTROSURGICAL)
ELECTRODE REM PT RTRN 9FT ADLT (ELECTROSURGICAL) IMPLANT
FIBER LASER FLEXIVA 1000 (UROLOGICAL SUPPLIES) IMPLANT
FIBER LASER FLEXIVA 200 (UROLOGICAL SUPPLIES) IMPLANT
FIBER LASER FLEXIVA 365 (UROLOGICAL SUPPLIES) IMPLANT
FIBER LASER FLEXIVA 550 (UROLOGICAL SUPPLIES) IMPLANT
FIBER LASER TRAC TIP (UROLOGICAL SUPPLIES) IMPLANT
GAUZE SPONGE 4X4 12PLY STRL (GAUZE/BANDAGES/DRESSINGS) ×2 IMPLANT
GAUZE SPONGE 4X4 16PLY XRAY LF (GAUZE/BANDAGES/DRESSINGS) ×2 IMPLANT
GLOVE BIO SURGEON STRL SZ7.5 (GLOVE) ×2 IMPLANT
GLOVE BIOGEL M STRL SZ7.5 (GLOVE) ×2 IMPLANT
GOWN STRL REUS W/TWL LRG LVL3 (GOWN DISPOSABLE) ×2 IMPLANT
GOWN STRL REUS W/TWL XL LVL3 (GOWN DISPOSABLE) ×2 IMPLANT
KIT SUPRAPUBIC CATH (MISCELLANEOUS) IMPLANT
MANIFOLD NEPTUNE II (INSTRUMENTS) ×2 IMPLANT
NEEDLE HYPO 22GX1.5 SAFETY (NEEDLE) IMPLANT
NS IRRIG 1000ML POUR BTL (IV SOLUTION) ×2 IMPLANT
PACK CYSTO (CUSTOM PROCEDURE TRAY) ×2 IMPLANT
PENCIL BUTTON HOLSTER BLD 10FT (ELECTRODE) IMPLANT
PLUG CATH AND CAP STER (CATHETERS) IMPLANT
SCRUB PCMX 4 OZ (MISCELLANEOUS) IMPLANT
SHIELD EYE BINOCULAR (MISCELLANEOUS) IMPLANT
SUT ETHILON 3 0 PS 1 (SUTURE) ×2 IMPLANT
SUT SILK 2 0 30  PSL (SUTURE) ×1
SUT SILK 2 0 30 PSL (SUTURE) ×1 IMPLANT
SYR 20CC LL (SYRINGE) IMPLANT
SYRINGE 10CC LL (SYRINGE) ×2 IMPLANT
SYRINGE IRR TOOMEY STRL 70CC (SYRINGE) ×2 IMPLANT
TAPE CLOTH SURG 4X10 WHT LF (GAUZE/BANDAGES/DRESSINGS) ×2 IMPLANT
TOWEL OR 17X26 10 PK STRL BLUE (TOWEL DISPOSABLE) ×2 IMPLANT
TUBING CONNECTING 10 (TUBING) ×2 IMPLANT
WATER STERILE IRR 3000ML UROMA (IV SOLUTION) ×2 IMPLANT

## 2014-05-14 NOTE — Anesthesia Procedure Notes (Signed)
Procedure Name: LMA Insertion Date/Time: 05/14/2014 9:04 AM Performed by: Glory Buff Pre-anesthesia Checklist: Patient identified, Emergency Drugs available, Suction available, Patient being monitored and Timeout performed Patient Re-evaluated:Patient Re-evaluated prior to inductionOxygen Delivery Method: Circle system utilized Preoxygenation: Pre-oxygenation with 100% oxygen Intubation Type: IV induction Ventilation: Mask ventilation without difficulty LMA: LMA inserted LMA Size: 4.0 Number of attempts: 1 Placement Confirmation: positive ETCO2 Tube secured with: Tape

## 2014-05-14 NOTE — Op Note (Signed)
Pre-operative diagnosis :  1. Chronic urinary retention                                            2. Bladder stones                                            3. Neurogenic bladder  Postoperative diagnosis:  same  Operation:  Cystourethroscopy, cystolitholapaxy and irrigation of bladder stones, placement of suprapubic catheter (18 French-10 mL water in balloon)  Surgeon:  S. [name], MD  First assistant:  None  Anesthesia:  Gen. LMA  Preparation:  After appropriate pre-anesthesia, the patient was brought the operating, placed on the operating table in the dorsal supine position where general LMA anesthesia was introduced. He was then replaced the dorsal lithotomy position with pubis was prepped with Betadine solution and draped in usual fashion. The history was reviewed. The armband was double checked.  Review history:  1. Bladder calculus (N21.0)  Assessed By: [name] (Urology); Last Assessed: 18 Apr 2014 2. Chronic cystitis (N30.20)  Assessed By: [name] (Urology); Last Assessed: 18 Apr 2014 3. 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 Urinary tract infection due to Proteus (N39.0,B96.4)  History of Present Illness    79 yo widowed male, accompanied by his daughter, Ricardo [name], his primary caregiver and primary historian. They return today to discuss surgery for hx of bladder stone.    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 He was seen at the [facility], and had Foley catheter inserted. He was in a coma for 3 1/2 weeks, complicated by "mini-strokes". Urodynamics accomplished, but results unknown. He has been on trimethoprim suppression therapy, and currently is being treated with Augmentin for Proteus UTI. He has had chronic foley catheterization since his meningitis, because of repeated failed voiding trials, despite tamsulosin and finasteride. He  has had several episodes of traumatic foley catheter removals, but is currently doing well with size 18 F catheter, with 30cc sterile water in the balloon. Catheter is now changed q 2 weeks per [place].    Statement of  Likelihood of Success: Excellent. TIME-OUT observed.:  Procedure:  Cystourethroscopy was accomplished, showing elevated bladder neck, and bilateral prostate hypertrophy. Multiple bladder stones were identified, stuck in a mixture of bladder matrix stone material. This was irrigated free the bladder by using a piston syringe, and multiple irrigations using a "washing machine" action, in order to break up the "gummy" stone material, and irrigate the hard stones out of the bladder. Following this, the bladder mucosa was irritated, and friable. Bladder held approximately 200 mL. With the patient in Trendelenburg, and the bladder full, the [place] tractor was placed, and a cutdown was accomplished. A size 18 French Silastic catheter was then sutured to the [place] tractor, this was brought through the bladder, into the urethra. Urethra itself appeared to have mid shaft hypospadias, cause because of chronic Foley catheterization, and pressure necrosis.  The suture was cut, the [place] removed, and the Foley drawn into the bladder under direct vision with the cystoscope. 20 mL of sterile water were then placed in the balloon. A Foley catheter was sutured in place with 3-0 nylon suture.  Cystoscopy showed the catheter to be within the bladder. The catheter irrigated easily and was attached to a drainage bag. The patient was awakened and taken to recovery room in good condition.

## 2014-05-14 NOTE — Anesthesia Postprocedure Evaluation (Signed)
  Anesthesia Post-op Note  Patient: Ricardo [name]  Procedure(s) Performed: Procedure(s) (LRB): CYSTOSCOPY WITH LITHOLAPAXY (N/A) INSERTION OF SUPRAPUBIC CATHETER (N/A)  Patient Location: PACU  Anesthesia Type: General  Level of Consciousness: awake and alert   Airway and Oxygen Therapy: Patient Spontanous Breathing  Post-op Pain: mild  Post-op Assessment: Post-op Vital signs reviewed, Patient's Cardiovascular Status Stable, Respiratory Function Stable, Patent Airway and No signs of Nausea or vomiting  Last Vitals:  Filed Vitals:   05/14/14 1230  BP: 139/61  Pulse: 81  Temp: 36.3 C  Resp: 16    Post-op Vital Signs: stable   Complications: No apparent anesthesia complications

## 2014-05-14 NOTE — Interval H&P Note (Signed)
History and Physical Interval Note:  05/14/2014 8:38 AM  Ricardo [name]  has presented today for surgery, with the diagnosis of BLADDER STONE  The various methods of treatment have been discussed with the patient and family. After consideration of risks, benefits and other options for treatment, the patient has consented to  Procedure(s) with comments: CYSTOSCOPY WITH LITHOLAPAXY (N/A) HOLMIUM LASER APPLICATION (N/A) STONE EXTRACTION WITH BASKET (N/A) - BLADDER STONE INSERTION OF SUPRAPUBIC CATHETER (N/A) as a surgical intervention .  The patient's history has been reviewed, patient examined, no change in status, stable for surgery.  I have reviewed the patient's chart and labs.  Questions were answered to the patient's satisfaction.     [name] I Malina Geers

## 2014-05-14 NOTE — Anesthesia Preprocedure Evaluation (Addendum)
Anesthesia Evaluation  Patient identified by MRN, date of birth, ID band Patient awake    Reviewed: Allergy & Precautions, NPO status , Patient's Chart, lab work & pertinent test results  Airway Mallampati: II  TM Distance: >3 FB Neck ROM: Full    Dental no notable dental hx.    Pulmonary COPDformer smoker,  breath sounds clear to auscultation  Pulmonary exam normal       Cardiovascular hypertension, Pt. on medications Rhythm:Regular Rate:Normal     Neuro/Psych H/o meningitis. Blind. Unable to walk TIAnegative neurological ROS  negative psych ROS   GI/Hepatic negative GI ROS, Neg liver ROS,   Endo/Other  negative endocrine ROS  Renal/GU negative Renal ROS  negative genitourinary   Musculoskeletal negative musculoskeletal ROS (+)   Abdominal   Peds negative pediatric ROS (+)  Hematology negative hematology ROS (+)   Anesthesia Other Findings   Reproductive/Obstetrics negative OB ROS                          Anesthesia Physical Anesthesia Plan  ASA: III  Anesthesia Plan: General   Post-op Pain Management:    Induction: Intravenous  Airway Management Planned: LMA  Additional Equipment:   Intra-op Plan:   Post-operative Plan: Extubation in OR  Informed Consent: I have reviewed the patients History and Physical, chart, labs and discussed the procedure including the risks, benefits and alternatives for the proposed anesthesia with the patient or authorized representative who has indicated his/her understanding and acceptance.   Dental advisory given  Plan Discussed with: CRNA  Anesthesia Plan Comments:         Anesthesia Quick Evaluation

## 2014-05-14 NOTE — Transfer of Care (Signed)
Immediate Anesthesia Transfer of Care Note  Patient: Ricardo [name]  Procedure(s) Performed: Procedure(s): CYSTOSCOPY WITH LITHOLAPAXY (N/A) INSERTION OF SUPRAPUBIC CATHETER (N/A)  Patient Location: PACU  Anesthesia Type:General  Level of Consciousness: awake, alert  and oriented  Airway & Oxygen Therapy: Patient Spontanous Breathing and Patient connected to face mask oxygen  Post-op Assessment: Report given to RN and Post -op Vital signs reviewed and stable  Post vital signs: Reviewed and stable  Last Vitals:  Filed Vitals:   05/14/14 0648  BP: 138/66  Pulse: 87  Temp: 36.6 C  Resp: 16    Complications: No apparent anesthesia complications

## 2014-05-14 NOTE — Telephone Encounter (Signed)
Seen on 05/11/14

## 2014-05-14 NOTE — Discharge Instructions (Signed)
CYSTOSCOPY HOME CARE INSTRUCTIONS  Activity: Rest for the remainder of the day.  Do not drive or operate equipment today.  You may resume normal activities in one to two days as instructed by your physician.   Meals: Drink plenty of liquids and eat light foods such as gelatin or soup this evening.  You may return to a normal meal plan tomorrow.  Return to Work: You may return to work in one to two days or as instructed by your physician.  Special Instructions / Symptoms: Call your physician if any of these symptoms occur:   -persistent or heavy bleeding  -bleeding which continues after first few urination  -large blood clots that are difficult to pass  -urine stream diminishes or stops completely  -fever equal to or higher than 101 degrees Farenheit.  -cloudy urine with a strong, foul odor  -severe pain  Females should always wipe from front to back after elimination.  You may feel some burning pain when you urinate.  This should disappear with time.  Applying moist heat to the lower abdomen or a hot tub bath may help relieve the pain. \  Follow-Up / Date of Return Visit to Your Physician:   Antibiotic Medication Antibiotics are among the most frequently prescribed medicines. Antibiotics cure illness by assisting our body to injure or kill the bacteria that cause infection. While antibiotics are useful to treat a wide variety of infections they are useless against viruses. Antibiotics cannot cure colds, flu, or other viral infections.  There are many types of antibiotics available. Your caregiver will decide which antibiotic will be useful for an illness. Never take or give someone else's antibiotics or left over medicine. Your caregiver may also take into account:  Allergies.  The cost of the medicine.  Dosing schedules.  Taste.  Common side effects when choosing an antibiotic for an infection. Ask your caregiver if you have questions about why a certain medicine was  chosen. HOME CARE INSTRUCTIONS Read all instructions and labels on medicine bottles carefully. Some antibiotics should be taken on an empty stomach while others should be taken with food. Taking antibiotics incorrectly may reduce how well they work. Some antibiotics need to be kept in the refrigerator. Others should be kept at room temperature. Ask your caregiver or pharmacist if you do not understand how to give the medicine. Be sure to give the amount of medicine your caregiver has prescribed. Even if you feel better and your symptoms improve, bacteria may still remain alive in the body. Taking all of the medicine will prevent:  The infection from returning and becoming harder to treat.  Complications from partially treated infections. If there is any medicine left over after you have taken the medicine as your caregiver has instructed, throw the medicine away. Be sure to tell your caregiver if you:  Are allergic to any medicines.  Are pregnant or intend to become pregnant while using this medicine.  Are breastfeeding.  Are taking any other prescription, non-prescription medicine, or herbal remedies.  Have any other medical conditions or problems you have not already discussed. If you are taking birth control pills, they may not work while you are on antibiotics. To avoid unwanted pregnancy:  Continue taking your birth control pills as usual.  Use a second form of birth control (such as condoms) while you are taking antibiotic medicine.  When you finish taking the antibiotic medicine, continue using the second form of birth control until you are finished with your current  1 month cycle of birth control pills. Try not to miss any doses of medicine. If you miss a dose, take it as soon as possible. However, if it is almost time for the next dose and the dosing schedule is:  2 doses a day, take the missed dose and the next dose 5 to 6 hours apart.  3 or more doses a day, take the missed  dose and the next dose 2 to 4 hours apart, then go back to the normal schedule.  If you are unable to make up a missed dose, take the next scheduled dose on time and complete the missed dose at the end of the prescribed time for your medicine. SIDE EFFECTS TO TAKING ANTIBIOTICS Common side effects to antibiotic use include:  Soft stools or diarrhea.  Mild stomach upset.  Sun sensitivity. SEEK MEDICAL CARE IF:   If you get worse or do not improve within a few days of starting the medicine.  Vomiting develops.  Diaper rash or rash on the genitals appears.  Vaginal itching occurs.  White patches appear on the tongue or in the mouth.  Severe watery diarrhea and abdominal cramps occur.  Signs of an allergy develop (hives, unknown itchy rash appears). STOP TAKING THE ANTIBIOTIC. SEEK IMMEDIATE MEDICAL CARE IF:   Urine turns dark or blood colored.  Skin turns yellow.  Easy bruising or bleeding occurs.  Joint pain or muscle aches occur.  Fever returns.  Severe headache occurs.  Signs of an allergy develop (trouble breathing, wheezing, swelling of the lips, face or tongue, fainting, or blisters on the skin or in the mouth). STOP TAKING THE ANTIBIOTIC. Document Released: 10/02/2003 Document Revised: 04/13/2011 Document Reviewed: 10/11/2008 [facility] Patient Information 2015 [place], [place]. This information is not intended to replace advice given to you by your health care provider. Make sure you discuss any questions you have with your health care provider.  Call for an appointment to arrange follow-up.  Patient Signature:  ________________________________________________________  Nurse's Signature:  ________________________________________________________

## 2014-05-15 ENCOUNTER — Encounter (HOSPITAL_COMMUNITY): Payer: Self-pay | Admitting: Urology

## 2014-05-17 LAB — STONE ANALYSIS: Stone Weight KSTONE: 0.039 g

## 2014-05-20 DIAGNOSIS — Z466 Encounter for fitting and adjustment of urinary device: Secondary | ICD-10-CM | POA: Diagnosis not present

## 2014-05-20 DIAGNOSIS — N390 Urinary tract infection, site not specified: Secondary | ICD-10-CM | POA: Diagnosis not present

## 2014-05-20 DIAGNOSIS — J449 Chronic obstructive pulmonary disease, unspecified: Secondary | ICD-10-CM | POA: Diagnosis not present

## 2014-05-20 DIAGNOSIS — Z7982 Long term (current) use of aspirin: Secondary | ICD-10-CM | POA: Diagnosis not present

## 2014-05-20 DIAGNOSIS — R339 Retention of urine, unspecified: Secondary | ICD-10-CM | POA: Diagnosis not present

## 2014-05-20 DIAGNOSIS — I10 Essential (primary) hypertension: Secondary | ICD-10-CM | POA: Diagnosis not present

## 2014-06-19 ENCOUNTER — Other Ambulatory Visit: Payer: Medicare HMO

## 2014-06-22 DIAGNOSIS — E785 Hyperlipidemia, unspecified: Secondary | ICD-10-CM

## 2014-06-23 LAB — LIPID PANEL
CHOL/HDL RATIO: 2.1 ratio (ref 0.0–5.0)
CHOLESTEROL TOTAL: 151 mg/dL (ref 100–199)
HDL: 73 mg/dL (ref >39)
LDL Calculated: 54 mg/dL (ref 0–99)
Triglycerides: 122 mg/dL (ref 0–149)
VLDL Cholesterol Cal: 24 mg/dL (ref 5–40)

## 2014-07-03 ENCOUNTER — Other Ambulatory Visit: Payer: Self-pay | Admitting: *Deleted

## 2014-07-03 MED ORDER — ATORVASTATIN CALCIUM 10 MG PO TABS: 10.0000 mg | ORAL_TABLET | Freq: Every day | ORAL | Status: DC

## 2014-07-03 NOTE — Progress Notes (Signed)
This encounter was created in error - please disregard.

## 2014-07-03 NOTE — Telephone Encounter (Signed)
Called daughter [name]) informed her of Dr. Vale Haven request for her father to start the Lipitor 1 tab qhs. She stated that she understood and would go pick up the medication to night to get him started.

## 2014-07-24 ENCOUNTER — Ambulatory Visit: Payer: Self-pay | Admitting: Urology

## 2014-07-24 ENCOUNTER — Encounter: Payer: Self-pay | Admitting: Urology

## 2014-07-24 VITALS — BP 159/75 | HR 82 | Ht 73.0 in | Wt 180.8 lb

## 2014-07-24 DIAGNOSIS — N400 Enlarged prostate without lower urinary tract symptoms: Secondary | ICD-10-CM

## 2014-07-24 DIAGNOSIS — N319 Neuromuscular dysfunction of bladder, unspecified: Secondary | ICD-10-CM

## 2014-07-24 NOTE — Progress Notes (Signed)
Chief Complaint : I need may suprapubic tube changed    HPI : Riley [name] is a 79 y.o. male who is being seen today for evaluation of his problem of neurogenic bladder requiring suprapubic tube.  This man has been seen in the past by Dr. Prudence Davidson and was on tamsulosin and Avodart.  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 He was troubled with penile erosion and a urologist in [place], placethed a suprapubic tube in April, which he currently has.  He was also found to have a bladder stone which the physician treated. He currently has an 40 [place] latex suprapubic tube which is changed by visiting nurses on a monthly basis.  He lives with his daughter who cares for him.  He denies any blood in his urine.  Medications : has a current medication list which includes the following prescription(s): atorvastatin, pantoprazole, [facility] childrens aspirin, trimethoprim, aspirin, senior multivitamin plus, benicar hct, and serevent diskus.  Allergies :   Allergies   Allergen Reactions    Peanut-Derived      Throat swells      No Known Drug Allergy      Created by Conversion - 0;                                                                                               Review of Systems   Respiratory: Positive for shortness of breath.    Gastrointestinal: Positive for constipation.   Genitourinary: Positive for difficulty urinating.   Musculoskeletal: Positive for gait problem and myalgias.   Neurological: Positive for tremors, weakness and numbness.   All other systems reviewed and are negative.      Past Medical History :   Past Medical History   Diagnosis Date    Asthma     Cerebral artery occlusion with cerebral infarction     COPD (chronic obstructive pulmonary disease)     COPD (chronic obstructive pulmonary disease)     Hyperlipidemia     Hypertension     Meningitis     Testicle cancer      Surgical  History :  has a past surgical history that includes Appendectomy; Testicle removal; Tonsillectomy and adenoidectomy; Eye surgery; ostate surgery; Cochlear implant; and superpubic cath.  Family History : family history includes Heart disease in his father.  Social History :  reports that he quit smoking about 48 years ago. His smoking use included Cigarettes. He has a 10.00 pack-year smoking history. He does not have any smokeless tobacco history on file.    Physical Exam   Pulmonary/Chest: Effort normal and breath sounds normal.   Abdominal:   Suprapubic tube   Genitourinary:   Genitourinary Comments: Suprapubic tube draining clear yellow urine   Musculoskeletal:   No CVA tenderness and no back pain   Neurological:   Wheelchair-bound.  He stands with 1 assist and is unsteady on his feet.  He cannot walk independently       Lab Review  Assessment: neurogenic bladder    Plan: the patient has an 62 French latex suprapubic tube secondary to his neurogenic bladder. I will call the local nursing care provider and have them change the suprapubic to every 4 weeks. The patient's tamsulosin and finasteride were discontinued as he is on chronic catheter urine drainage.  It was last changed June 15.  They will call me with any problems.

## 2014-08-03 ENCOUNTER — Telehealth: Payer: Self-pay | Admitting: Urology

## 2014-08-03 ENCOUNTER — Other Ambulatory Visit: Payer: Self-pay | Admitting: Urology

## 2014-08-03 NOTE — Telephone Encounter (Signed)
[name], the patient's daughter is returning a call from Arnette Felts, NP. She states this is regarding wound, (please see notes below). She is requesting a call back at [phone].

## 2014-08-03 NOTE — Telephone Encounter (Signed)
The patients daughter was called. She was reassured that as long as there is no redness that this is a common occurrence. There is no fever or other symptoms of infection. She will call me with any other concerns

## 2014-08-03 NOTE — Telephone Encounter (Signed)
Riley [name] daughterAndrea calling to report that he is experiencing infection/green puss coming out of his super-pubic catheter tube.  Stated the incision itself doesn't look bad. These symptoms have been going on for 2 day(s).  No pain and no fever    Patient requesting same day appointment? no because of travel distance Riley [name] is asking if a script can [facility] sent to [id number] [place], [place] - [address]    Patient requesting call back? yes    Riley [name] is asking to please be called back @  [phone]

## 2014-08-03 NOTE — Telephone Encounter (Signed)
Patient started [facility] today, they are calling because the patient's family would like to know if they could have an order for physical therapy. Patient does not have a PCP since he lives in [place].     Maureen from [facility] cell number [phone]

## 2014-08-09 NOTE — Telephone Encounter (Signed)
The PT will be ordered

## 2014-08-24 ENCOUNTER — Other Ambulatory Visit: Payer: Self-pay | Admitting: Internal Medicine

## 2014-08-24 ENCOUNTER — Other Ambulatory Visit: Payer: Self-pay | Admitting: *Deleted

## 2014-08-24 MED ORDER — TRIMETHOPRIM 100 MG PO TABS: 100.0000 mg | ORAL_TABLET | Freq: Every morning | ORAL | Status: DC

## 2014-08-24 NOTE — Telephone Encounter (Signed)
Patient daughter, [name] called and requested to be faxed to pharmacy.

## 2014-08-27 ENCOUNTER — Other Ambulatory Visit: Payer: Self-pay | Admitting: *Deleted

## 2014-08-27 MED ORDER — SALMETEROL XINAFOATE 50 MCG/DOSE IN AEPB
1.0000 | INHALATION_SPRAY | Freq: Two times a day (BID) | RESPIRATORY_TRACT | Status: DC
Start: 2014-08-27 — End: 2015-02-28

## 2014-08-27 NOTE — Telephone Encounter (Signed)
[facility] [place] [facility] and stated that patient is visiting and needs refill. Faxed.

## 2014-09-10 ENCOUNTER — Telehealth: Payer: Self-pay | Admitting: Urology

## 2014-09-10 NOTE — Telephone Encounter (Signed)
Jenelle from [facility] stating that she is discharging the patient from home physical therapy. The patient is headed back to [place]. Jenelle wanted to inform Dr.Messing.

## 2014-09-12 ENCOUNTER — Other Ambulatory Visit: Payer: Self-pay | Admitting: Internal Medicine

## 2014-09-28 ENCOUNTER — Other Ambulatory Visit: Payer: Self-pay

## 2014-09-28 MED ORDER — PANTOPRAZOLE SODIUM 40 MG PO TBEC: DELAYED_RELEASE_TABLET | ORAL | Status: DC

## 2014-09-28 MED ORDER — ATORVASTATIN CALCIUM 10 MG PO TABS: ORAL_TABLET | ORAL | Status: DC

## 2014-10-01 ENCOUNTER — Other Ambulatory Visit: Payer: Self-pay | Admitting: *Deleted

## 2014-10-01 MED ORDER — ATORVASTATIN CALCIUM 10 MG PO TABS
ORAL_TABLET | ORAL | Status: DC
Start: 2014-10-01 — End: 2014-10-24

## 2014-10-01 MED ORDER — PANTOPRAZOLE SODIUM 40 MG PO TBEC: DELAYED_RELEASE_TABLET | ORAL | Status: DC

## 2014-10-01 NOTE — Telephone Encounter (Signed)
[facility] [address] [facility] 

## 2014-10-04 ENCOUNTER — Other Ambulatory Visit: Payer: Medicare HMO

## 2014-10-04 DIAGNOSIS — E785 Hyperlipidemia, unspecified: Secondary | ICD-10-CM

## 2014-10-04 DIAGNOSIS — N390 Urinary tract infection, site not specified: Secondary | ICD-10-CM

## 2014-10-04 DIAGNOSIS — I10 Essential (primary) hypertension: Secondary | ICD-10-CM

## 2014-10-05 LAB — CBC WITH DIFFERENTIAL/PLATELET
BASOS ABS: 0 10*3/uL (ref 0.0–0.2)
Basos: 0 %
EOS (ABSOLUTE): 0.2 10*3/uL (ref 0.0–0.4)
Eos: 2 %
HEMOGLOBIN: 14.4 g/dL (ref 12.6–17.7)
Hematocrit: 42.1 % (ref 37.5–51.0)
IMMATURE GRANS (ABS): 0 10*3/uL (ref 0.0–0.1)
Immature Granulocytes: 0 %
LYMPHS: 23 %
Lymphocytes Absolute: 1.9 10*3/uL (ref 0.7–3.1)
MCH: 30.1 pg (ref 26.6–33.0)
MCHC: 34.2 g/dL (ref 31.5–35.7)
MCV: 88 fL (ref 79–97)
MONOCYTES: 10 %
Monocytes Absolute: 0.9 10*3/uL (ref 0.1–0.9)
NEUTROS ABS: 5.4 10*3/uL (ref 1.4–7.0)
Neutrophils: 65 %
Platelets: 268 10*3/uL (ref 150–379)
RBC: 4.78 x10E6/uL (ref 4.14–5.80)
RDW: 13.3 % (ref 12.3–15.4)
WBC: 8.4 10*3/uL (ref 3.4–10.8)

## 2014-10-05 LAB — COMPREHENSIVE METABOLIC PANEL
A/G RATIO: 1.8 (ref 1.1–2.5)
ALK PHOS: 81 IU/L (ref 39–117)
ALT: 15 IU/L (ref 0–44)
AST: 15 IU/L (ref 0–40)
Albumin: 4.4 g/dL (ref 3.5–4.8)
BILIRUBIN TOTAL: 0.5 mg/dL (ref 0.0–1.2)
BUN/Creatinine Ratio: 13 (ref 10–22)
BUN: 13 mg/dL (ref 8–27)
CO2: 23 mmol/L (ref 18–29)
Calcium: 9.2 mg/dL (ref 8.6–10.2)
Chloride: 93 mmol/L — ABNORMAL LOW (ref 97–108)
Creatinine, Ser: 1.03 mg/dL (ref 0.76–1.27)
GFR calc Af Amer: 81 mL/min/{1.73_m2} (ref >59)
GFR calc non Af Amer: 70 mL/min/{1.73_m2} (ref >59)
GLOBULIN, TOTAL: 2.4 g/dL (ref 1.5–4.5)
Glucose: 109 mg/dL — ABNORMAL HIGH (ref 65–99)
POTASSIUM: 4.3 mmol/L (ref 3.5–5.2)
SODIUM: 135 mmol/L (ref 134–144)
Total Protein: 6.8 g/dL (ref 6.0–8.5)

## 2014-10-05 LAB — LIPID PANEL
CHOLESTEROL TOTAL: 161 mg/dL (ref 100–199)
Chol/HDL Ratio: 2.5 ratio units (ref 0.0–5.0)
HDL: 65 mg/dL (ref >39)
LDL CALC: 77 mg/dL (ref 0–99)
TRIGLYCERIDES: 96 mg/dL (ref 0–149)
VLDL CHOLESTEROL CAL: 19 mg/dL (ref 5–40)

## 2014-10-09 ENCOUNTER — Other Ambulatory Visit: Payer: Self-pay | Admitting: Internal Medicine

## 2014-10-10 ENCOUNTER — Ambulatory Visit (INDEPENDENT_AMBULATORY_CARE_PROVIDER_SITE_OTHER): Payer: Medicare HMO | Admitting: Internal Medicine

## 2014-10-10 ENCOUNTER — Encounter: Payer: Self-pay | Admitting: Internal Medicine

## 2014-10-10 VITALS — BP 116/78 | HR 84 | Temp 97.8°F | Resp 20 | Ht 73.0 in | Wt 198.8 lb

## 2014-10-10 DIAGNOSIS — Z23 Encounter for immunization: Secondary | ICD-10-CM

## 2014-10-10 DIAGNOSIS — F411 Generalized anxiety disorder: Secondary | ICD-10-CM | POA: Diagnosis not present

## 2014-10-10 DIAGNOSIS — N400 Enlarged prostate without lower urinary tract symptoms: Secondary | ICD-10-CM | POA: Diagnosis not present

## 2014-10-10 DIAGNOSIS — N319 Neuromuscular dysfunction of bladder, unspecified: Secondary | ICD-10-CM | POA: Diagnosis not present

## 2014-10-10 DIAGNOSIS — J439 Emphysema, unspecified: Secondary | ICD-10-CM

## 2014-10-10 DIAGNOSIS — K219 Gastro-esophageal reflux disease without esophagitis: Secondary | ICD-10-CM

## 2014-10-10 DIAGNOSIS — I10 Essential (primary) hypertension: Secondary | ICD-10-CM

## 2014-10-10 DIAGNOSIS — H543 Unqualified visual loss, both eyes: Secondary | ICD-10-CM | POA: Diagnosis not present

## 2014-10-10 DIAGNOSIS — E785 Hyperlipidemia, unspecified: Secondary | ICD-10-CM

## 2014-10-10 DIAGNOSIS — Z0000 Encounter for general adult medical examination without abnormal findings: Secondary | ICD-10-CM

## 2014-10-10 MED ORDER — CITALOPRAM HYDROBROMIDE 10 MG PO TABS: 10.0000 mg | ORAL_TABLET | Freq: Every day | ORAL | Status: DC

## 2014-10-10 NOTE — Progress Notes (Signed)
Test not given blind

## 2014-10-10 NOTE — Progress Notes (Signed)
Patient ID: Ricardo [name], male   DOB: 1935/02/23, 79 y.o.   MRN: [id number] Subjective:     Ricardo [name] is a 79 y.o. male and is here for a comprehensive physical exam. The patient reports problems - needs flu shot; numbness in feet and they feel cold; would like to come off serevent. (last PFTs >10 yrs); he has uncontrolled anxiety and difficulty staying asleep due to increased worry about everything.  Past Medical History  Diagnosis Date  . Rotator cuff tear     right  . Hypertension   . High cholesterol   . Meningitis   . Enlarged prostate   . 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 Deaf     due to meningitis  . Loss of balance     due to meningitis  . Unable to walk     due to meningitis  . Eczema   . 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 Skin cancer   . Neurogenic bladder     History of   Past Surgical History  Procedure Laterality Date  . Appendectomy    . Prostate surgery      biopsy x 2  . Tonsillectomy      childhood  . Testicle removal Left     October 1956  . Cochlear implant    . Cystoscopy with litholapaxy N/A 05/14/2014    Procedure: CYSTOSCOPY WITH LITHOLAPAXY;  Surgeon: [name], MD;  Location: [facility];  Service: Urology;  Laterality: N/A;  . Insertion of suprapubic catheter N/A 05/14/2014    Procedure: INSERTION OF SUPRAPUBIC CATHETER;  Surgeon: [name], MD;  Location: [facility];  Service: Urology;  Laterality: N/A;   Family History  Problem Relation Age of Onset  . Heart failure Father   . Stroke Mother     Social History   Social History  . Marital Status: Widowed    Spouse Name: N/A  . Number of Children: N/A  . Years of Education: N/A   Occupational History  . Not on file.   Social History Main Topics  . Smoking status: Former Smoker -- 10 years    Types: Cigarettes    Start date: 02/10/1955    Quit date: 02/09/1965  . Smokeless tobacco: Never Used  . Alcohol Use: No  . Drug Use: No  . Sexual Activity: Not on file   Other  Topics Concern  . Not on file   Social History Narrative   Diet- Fairly balanced   Caffeine- Yes, Coffee   Married- 1964, [place] living now with daughter, all on the first floor   Pets-4, dog and cat   Current/past profession-Teacher, English as a second language teacher for [employer], Chief Financial Officer, Freight forwarder   Exercise- Yes, Rehab 3 times a week   Living will- Yes   DNR- Yes   POA/HPOA- Yes         Health Maintenance  Topic Date Due  . TETANUS/TDAP  12/30/1956  . COLONOSCOPY  12/31/1987  . ZOSTAVAX  12/30/1997  . PNA vac Low Risk Adult (2 of 2 - PCV13) 02/02/2014  . INFLUENZA VACCINE  09/03/2014    Review of Systems   Review of Systems  Constitutional: Negative for fever, chills and malaise/fatigue.  HENT: Positive for hearing loss (wears left coclhear implant). Negative for sore throat and tinnitus.   Eyes: Negative for blurred vision and double vision.  Respiratory: Negative for cough, shortness of breath and wheezing.   Cardiovascular: Negative  for chest pain, palpitations, orthopnea and leg swelling.  Gastrointestinal: Negative for heartburn, nausea, vomiting, abdominal pain, diarrhea, constipation and blood in stool.  Genitourinary: Negative for dysuria, urgency, frequency and hematuria.  Musculoskeletal: Positive for myalgias and joint pain. Negative for falls.  Skin: Negative for rash.  Neurological: Positive for weakness. Negative for dizziness, tingling, tremors, sensory change, focal weakness, seizures, loss of consciousness and headaches.  Endo/Heme/Allergies: Negative for environmental allergies. Does not bruise/bleed easily.  Psychiatric/Behavioral: Negative for depression and memory loss. The patient is nervous/anxious and has insomnia (awakens at 0300-0400 with increased worry and feeling overwhelmed).      Objective:      Physical Exam  Constitutional: He is oriented to person, place, and time and well-developed, well-nourished, and in no distress.  HENT:  Head:  Normocephalic and atraumatic.  Right Ear: Hearing, tympanic membrane, external ear and ear canal normal.  Left Ear: Hearing, tympanic membrane, external ear and ear canal normal.  Mouth/Throat: Uvula is midline, oropharynx is clear and moist and mucous membranes are normal.  Eyes: Conjunctivae, EOM and lids are normal. Right eye exhibits no discharge. No scleral icterus.  Neck: Trachea normal. Neck supple. Carotid bruit is not present. No tracheal deviation present. No thyroid mass and no thyromegaly present.  Cardiovascular: Normal rate, regular rhythm, normal heart sounds and intact distal pulses.  Exam reveals no gallop and no friction rub.   No murmur heard. Pulmonary/Chest: Effort normal and breath sounds normal. No stridor. No respiratory distress. He has no wheezes. He has no rhonchi. He has no rales. He exhibits no mass, no tenderness and no crepitus. Right breast exhibits no inverted nipple, no mass, no nipple discharge, no skin change and no tenderness. Left breast exhibits no inverted nipple, no mass, no nipple discharge, no skin change and no tenderness. Breasts are symmetrical.  Abdominal: Soft. Normal appearance, normal aorta and bowel sounds are normal. He exhibits no abdominal bruit, no ascites, no pulsatile midline mass and no mass. There is no hepatosplenomegaly. There is no tenderness. There is no rebound. No hernia.  (+)suprapubic cath DTG clear yellow urine  Genitourinary:  Deferred to urology  Musculoskeletal: He exhibits edema and tenderness.  Lymphadenopathy:       Head (right side): No submandibular and no posterior auricular adenopathy present.       Head (left side): No submandibular and no posterior auricular adenopathy present.    He has no cervical adenopathy.       Right: No supraclavicular adenopathy present.       Left: No supraclavicular adenopathy present.  Neurological: He is alert and oriented to person, place, and time. He has normal motor skills and normal  strength. He displays abnormal reflex.  Skin: Skin is warm, dry and intact. No rash noted.  Psychiatric: Mood, memory, affect and judgment normal.   Diabetic Foot Exam - Simple   Simple Foot Form  Diabetic Foot exam was performed with the following findings:  Yes 10/10/2014 11:27 AM  Visual Inspection  See comments:  Yes  Sensation Testing  See comments:  Yes  Pulse Check  Posterior Tibialis and Dorsalis pulse intact bilaterally:  Yes  Comments  Reduced monofilament sensation in toes b/l. Bunions present b/l. No calluses or ulcerations     MMSE - Mini Mental State Exam 10/10/2014  Orientation to time 5  Orientation to Place 5  Registration 3  Attention/ Calculation 5  Recall 3  Language- name 2 objects 2  Language- repeat 1  Language- follow  3 step command 3  Language- read & follow direction 0  Write a sentence 0  Copy design 0  Total score 27      Recent Results (from the past 2160 hour(s))  Comprehensive metabolic panel     Status: Abnormal   Collection Time: 10/04/14 10:28 AM  Result Value Ref Range   Glucose 109 (H) 65 - 99 mg/dL   BUN 13 8 - 27 mg/dL   Creatinine, Ser 1.03 0.76 - 1.27 mg/dL   GFR calc non Af Amer 70 >59 mL/min/1.73   GFR calc Af Amer 81 >59 mL/min/1.73   BUN/Creatinine Ratio 13 10 - 22   Sodium 135 134 - 144 mmol/L   Potassium 4.3 3.5 - 5.2 mmol/L   Chloride 93 (L) 97 - 108 mmol/L   CO2 23 18 - 29 mmol/L   Calcium 9.2 8.6 - 10.2 mg/dL   Total Protein 6.8 6.0 - 8.5 g/dL   Albumin 4.4 3.5 - 4.8 g/dL   Globulin, Total 2.4 1.5 - 4.5 g/dL   Albumin/Globulin Ratio 1.8 1.1 - 2.5   Bilirubin Total 0.5 0.0 - 1.2 mg/dL   Alkaline Phosphatase 81 39 - 117 IU/L   AST 15 0 - 40 IU/L   ALT 15 0 - 44 IU/L  CBC with Differential     Status: None   Collection Time: 10/04/14 10:35 AM  Result Value Ref Range   WBC 8.4 3.4 - 10.8 x10E3/uL   RBC 4.78 4.14 - 5.80 x10E6/uL   Hemoglobin 14.4 12.6 - 17.7 g/dL   Hematocrit 42.1 37.5 - 51.0 %   MCV 88 79 - 97 fL    MCH 30.1 26.6 - 33.0 pg   MCHC 34.2 31.5 - 35.7 g/dL   RDW 13.3 12.3 - 15.4 %   Platelets 268 150 - 379 x10E3/uL   Neutrophils 65 %   Lymphs 23 %   Monocytes 10 %   Eos 2 %   Basos 0 %   Neutrophils Absolute 5.4 1.4 - 7.0 x10E3/uL   Lymphocytes Absolute 1.9 0.7 - 3.1 x10E3/uL   Monocytes Absolute 0.9 0.1 - 0.9 x10E3/uL   EOS (ABSOLUTE) 0.2 0.0 - 0.4 x10E3/uL   Basophils Absolute 0.0 0.0 - 0.2 x10E3/uL   Immature Granulocytes 0 %   Immature Grans (Abs) 0.0 0.0 - 0.1 x10E3/uL  Lipid Panel     Status: None   Collection Time: 10/04/14 10:35 AM  Result Value Ref Range   Cholesterol, Total 161 100 - 199 mg/dL   Triglycerides 96 0 - 149 mg/dL   HDL 65 >39 mg/dL    Comment: According to ATP-III Guidelines, HDL-C >59 mg/dL is considered a negative risk factor for CHD.    VLDL Cholesterol Cal 19 5 - 40 mg/dL   LDL Calculated 77 0 - 99 mg/dL   Chol/HDL Ratio 2.5 0.0 - 5.0 ratio units    Comment:                                   T. Chol/HDL Ratio                                             Men  Women  1/2 Avg.Risk  3.4    3.3                                   Avg.Risk  5.0    4.4                                2X Avg.Risk  9.6    7.1                                3X Avg.Risk 23.4   11.0     Assessment:    Healthy male exam.       ICD-9-CM ICD-10-CM   1. Well adult exam V70.0 Z00.00   2. Essential hypertension 401.9 I10   3. Hyperlipidemia 272.4 E78.5   4. Neurogenic bladder 596.54 N31.9   5. Gastroesophageal reflux disease without esophagitis 530.81 K21.9   6. BPH (benign prostatic hypertrophy) 600.00 N40.0   7. Impaired vision in both eyes 369.3 H54.3   8. Generalized anxiety disorder 300.02 F41.1 citalopram (CELEXA) 10 MG tablet  9. Pulmonary emphysema, unspecified emphysema type 492.8 J43.9 Pulmonary function test  10. Encounter for immunization Z23 Z23      Plan:     See After Visit Summary for Counseling Recommendations   Pt is UTD  on health maintenance. Vaccinations are UTD. Pt maintains a healthy lifestyle. Encouraged pt to exercise 30-45 minutes 4-5 times per week. Eat a well balanced diet. Avoid smoking. Limit alcohol intake. Wear seatbelt when riding in the car. Wear sun block (SPF >50) when spending extended times outside.  F/u with derm for annual skin check. Have look at midback SK  Will check PFTs to determine if pulm med can be d/c'd safely  Lab results reviewed with pt  Influenza vaccine given today  F/u in 1 month for anxiety  Malillany Kazlauskas S. [name]  [facility] [address] [place],  [address] [phone] Cell (Monday-Friday 8 AM - 5 PM) [phone] After 5 PM and follow prompts

## 2014-10-10 NOTE — Patient Instructions (Signed)
Flu shot given today  Continue current medications as ordered  May take 1/2 tablet of citalopram at bedtime  Encouraged him to exercise 30-45 minutes 4-5 times per week. Eat a well balanced diet. Avoid smoking. Limit alcohol intake. Wear seatbelt when riding in the car. Wear sun block (SPF >50) when spending extended times outside.  Follow up in 1 month for anxiety

## 2014-10-11 DIAGNOSIS — F411 Generalized anxiety disorder: Secondary | ICD-10-CM | POA: Insufficient documentation

## 2014-10-11 DIAGNOSIS — H543 Unqualified visual loss, both eyes: Secondary | ICD-10-CM | POA: Insufficient documentation

## 2014-10-24 ENCOUNTER — Other Ambulatory Visit: Payer: Self-pay | Admitting: Internal Medicine

## 2014-10-29 ENCOUNTER — Other Ambulatory Visit: Payer: Self-pay | Admitting: *Deleted

## 2014-10-29 MED ORDER — TRIMETHOPRIM 100 MG PO TABS: 100.0000 mg | ORAL_TABLET | Freq: Every morning | ORAL | Status: DC

## 2014-10-29 NOTE — Telephone Encounter (Signed)
[facility] 

## 2014-11-07 ENCOUNTER — Ambulatory Visit (INDEPENDENT_AMBULATORY_CARE_PROVIDER_SITE_OTHER): Payer: Medicare HMO | Admitting: Internal Medicine

## 2014-11-07 ENCOUNTER — Encounter: Payer: Self-pay | Admitting: Internal Medicine

## 2014-11-07 VITALS — BP 128/74 | HR 85 | Temp 98.4°F | Resp 14 | Ht 73.0 in | Wt 199.0 lb

## 2014-11-07 DIAGNOSIS — F411 Generalized anxiety disorder: Secondary | ICD-10-CM

## 2014-11-07 DIAGNOSIS — G4700 Insomnia, unspecified: Secondary | ICD-10-CM

## 2014-11-07 MED ORDER — CITALOPRAM HYDROBROMIDE 20 MG PO TABS: 20.0000 mg | ORAL_TABLET | Freq: Every day | ORAL | Status: DC

## 2014-11-07 MED ORDER — TRAZODONE HCL 50 MG PO TABS: 50.0000 mg | ORAL_TABLET | Freq: Every day | ORAL | Status: DC

## 2014-11-07 MED ORDER — TETANUS-DIPHTH-ACELL PERTUSSIS 5-2.5-18.5 LF-MCG/0.5 IM SUSP: 0.5000 mL | Freq: Once | INTRAMUSCULAR | Status: DC

## 2014-11-07 NOTE — Progress Notes (Signed)
Patient ID: Ricardo [name], male   DOB: July 16, 1935, 79 y.o.   MRN: [id number]    Location:    [facility]   Place of Service:   OFFICE  Chief Complaint  Patient presents with  . Follow-up    1 month follow-up on anxiety. Still with trouble staying asleep    . Immunizations    Zoster is up to date, RX printed for Tdap     HPI:  79 yo male seen today for f/u GAD. He was started on citalopram last month. Anxiety improved but still awakens at least 2 times per night and does not get but 4 hrs per night.   Past Medical History  Diagnosis Date  . Rotator cuff tear     right  . Hypertension   . High cholesterol   . Meningitis   . Enlarged prostate   . 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 Deaf     due to meningitis  . Loss of balance     due to meningitis  . Unable to walk     due to meningitis  . Eczema   . Cancer [facility]) 1956    left testicle  . Skin cancer   . Neurogenic bladder     History of    Past Surgical History  Procedure Laterality Date  . Appendectomy    . Prostate surgery      biopsy x 2  . Tonsillectomy      childhood  . Testicle removal Left     October 1956  . Cochlear implant    . Cystoscopy with litholapaxy N/A 05/14/2014    Procedure: CYSTOSCOPY WITH LITHOLAPAXY;  Surgeon: [name], MD;  Location: [facility];  Service: Urology;  Laterality: N/A;  . Insertion of suprapubic catheter N/A 05/14/2014    Procedure: INSERTION OF SUPRAPUBIC CATHETER;  Surgeon: [name], MD;  Location: [facility];  Service: Urology;  Laterality: N/A;    Patient Care Team: [name], DO as PCP - General (Internal Medicine) [name], MD as Consulting Physician (Urology)  Social History   Social History  . Marital Status: Widowed    Spouse Name: N/A  . Number of Children: N/A  . Years of Education: N/A   Occupational History  . Not on file.   Social History Main Topics  . Smoking status: Former Smoker -- 10 years    Types: Cigarettes    Start date:  02/10/1955    Quit date: 02/09/1965  . Smokeless tobacco: Never Used  . Alcohol Use: No  . Drug Use: No  . Sexual Activity: Not on file   Other Topics Concern  . Not on file   Social History Narrative   Diet- Fairly balanced   Caffeine- Yes, Coffee   Married- 1964, [place] living now with daughter, all on the first floor   Pets-4, dog and Radiation protection practitioner, English as a second language teacher for [employer], Chief Financial Officer, Freight forwarder   Exercise- Yes, Rehab 3 times a week   Living will- Yes   DNR- Yes   POA/HPOA- Yes           reports that he quit smoking about 49 years ago. His smoking use included Cigarettes. He started smoking about 59 years ago. He quit after 10 years of use. He has never used smokeless tobacco. He reports that he does not drink alcohol or use illicit drugs.  Allergies  Allergen Reactions  . Peanut Oil Shortness Of Breath  and Swelling    Itching  . Peanut-Containing Drug Products Shortness Of Breath and Swelling    Medications: Patient's Medications  New Prescriptions   No medications on file  Previous Medications   ASPIRIN 81 MG CHEWABLE TABLET    Take one tablet by mouth once daily   ATORVASTATIN (LIPITOR) 10 MG TABLET    TAKE 1 TABLET (10 MG TOTAL) BY MOUTH AT BEDTIME.   CALCIUM 250 MG CAPS    Take 1 capsule by mouth daily.   CHOLECALCIFEROL ([employer] D3) 5000 UNITS CAPSULE    Take 5,000 Units by mouth every morning.    CITALOPRAM (CELEXA) 10 MG TABLET    Take 1 tablet (10 mg total) by mouth daily.   LACTOBACILLUS (ACIDOPHILUS) 100 MG CAPS    Take 100 mg by mouth daily.   MELATONIN 10 MG TABS    Take 1 one tablet by mouth at bedtime   MULTIPLE VITAMIN (MULTIVITAMIN WITH MINERALS) TABS TABLET    Take 1 tablet by mouth daily.   PANTOPRAZOLE (PROTONIX) 40 MG TABLET    Take one tablet by mouth once daily for stomach   SALMETEROL (SEREVENT) 50 MCG/DOSE DISKUS INHALER    Inhale 1 puff into the lungs 2 (two) times daily.   TRIMETHOPRIM (TRIMPEX) 100 MG TABLET     Take 1 tablet (100 mg total) by mouth every morning.  Modified Medications   Modified Medication Previous Medication   TDAP (BOOSTRIX) 5-2.5-18.5 LF-MCG/0.5 INJECTION Tdap (BOOSTRIX) 5-2.5-18.5 LF-MCG/0.5 injection      Inject 0.5 mLs into the muscle once.    Inject 0.5 mLs into the muscle once.  Discontinued Medications   No medications on file    Review of Systems  Constitutional: Positive for fatigue. Negative for fever, chills and appetite change.  Eyes: Positive for visual disturbance.  Genitourinary: Positive for difficulty urinating (chronic foley).  Musculoskeletal: Positive for arthralgias.  Neurological: Negative for weakness.  Psychiatric/Behavioral: Positive for sleep disturbance. The patient is nervous/anxious.     Filed Vitals:   11/07/14 1037  BP: 128/74  Pulse: 85  Temp: 98.4 F (36.9 C)  TempSrc: Oral  Resp: 14  Height: 6\' 1"  (1.854 m)  Weight: 199 lb (90.266 kg)  SpO2: 96%   Body mass index is 26.26 kg/(m^2).  Physical Exam  Constitutional: He is oriented to person, place, and time. He appears well-developed and well-nourished. No distress.  Cardiovascular: Normal rate, regular rhythm and intact distal pulses.   Murmur (1/6 SEM) heard. No LE edema b/l. No calf TTP  Pulmonary/Chest: No respiratory distress. He has no wheezes. He has no rales. He exhibits no tenderness.  Neurological: He is alert and oriented to person, place, and time.  Skin: Skin is warm and dry. No rash noted.  Psychiatric: He has a normal mood and affect. His behavior is normal. Thought content normal.     Labs reviewed: Lab on 10/04/2014  Component Date Value Ref Range Status  . WBC 10/04/2014 8.4  3.4 - 10.8 x10E3/uL Final  . RBC 10/04/2014 4.78  4.14 - 5.80 x10E6/uL Final  . Hemoglobin 10/04/2014 14.4  12.6 - 17.7 g/dL Final  . Hematocrit 10/04/2014 42.1  37.5 - 51.0 % Final  . MCV 10/04/2014 88  79 - 97 fL Final  . MCH 10/04/2014 30.1  26.6 - 33.0 pg Final  . MCHC  10/04/2014 34.2  31.5 - 35.7 g/dL Final  . RDW 10/04/2014 13.3  12.3 - 15.4 % Final  . Platelets 10/04/2014 268  150 - 379 x10E3/uL Final  . Neutrophils 10/04/2014 65   Final  . Lymphs 10/04/2014 23   Final  . Monocytes 10/04/2014 10   Final  . Eos 10/04/2014 2   Final  . Basos 10/04/2014 0   Final  . Neutrophils Absolute 10/04/2014 5.4  1.4 - 7.0 x10E3/uL Final  . Lymphocytes Absolute 10/04/2014 1.9  0.7 - 3.1 x10E3/uL Final  . Monocytes Absolute 10/04/2014 0.9  0.1 - 0.9 x10E3/uL Final  . EOS (ABSOLUTE) 10/04/2014 0.2  0.0 - 0.4 x10E3/uL Final  . Basophils Absolute 10/04/2014 0.0  0.0 - 0.2 x10E3/uL Final  . Immature Granulocytes 10/04/2014 0   Final  . Immature Grans (Abs) 10/04/2014 0.0  0.0 - 0.1 x10E3/uL Final  . Cholesterol, Total 10/04/2014 161  100 - 199 mg/dL Final  . Triglycerides 10/04/2014 96  0 - 149 mg/dL Final  . HDL 10/04/2014 65  >39 mg/dL Final   Comment: According to ATP-III Guidelines, HDL-C >59 mg/dL is considered a negative risk factor for CHD.   Marland Kitchen VLDL Cholesterol Cal 10/04/2014 19  5 - 40 mg/dL Final  . LDL Calculated 10/04/2014 77  0 - 99 mg/dL Final  . Chol/HDL Ratio 10/04/2014 2.5  0.0 - 5.0 ratio units Final   Comment:                                   T. Chol/HDL Ratio                                             Men  Women                               1/2 Avg.Risk  3.4    3.3                                   Avg.Risk  5.0    4.4                                2X Avg.Risk  9.6    7.1                                3X Avg.Risk 23.4   11.0   . Glucose 10/04/2014 109* 65 - 99 mg/dL Final  . BUN 10/04/2014 13  8 - 27 mg/dL Final  . Creatinine, Ser 10/04/2014 1.03  0.76 - 1.27 mg/dL Final  . GFR calc non Af Amer 10/04/2014 70  >59 mL/min/1.73 Final  . GFR calc Af Amer 10/04/2014 81  >59 mL/min/1.73 Final  . BUN/Creatinine Ratio 10/04/2014 13  10 - 22 Final  . Sodium 10/04/2014 135  134 - 144 mmol/L Final  . Potassium 10/04/2014 4.3  3.5 - 5.2 mmol/L  Final  . Chloride 10/04/2014 93* 97 - 108 mmol/L Final  . CO2 10/04/2014 23  18 - 29 mmol/L Final  . Calcium 10/04/2014 9.2  8.6 - 10.2 mg/dL Final  . Total Protein 10/04/2014 6.8  6.0 - 8.5 g/dL Final  . Albumin 10/04/2014 4.4  3.5 - 4.8 g/dL Final  . Globulin, Total 10/04/2014 2.4  1.5 - 4.5 g/dL Final  . Albumin/Globulin Ratio 10/04/2014 1.8  1.1 - 2.5 Final  . Bilirubin Total 10/04/2014 0.5  0.0 - 1.2 mg/dL Final  . Alkaline Phosphatase 10/04/2014 81  39 - 117 IU/L Final  . AST 10/04/2014 15  0 - 40 IU/L Final  . ALT 10/04/2014 15  0 - 44 IU/L Final    No results found.   Assessment/Plan   ICD-9-CM ICD-10-CM   1. Generalized anxiety disorder - improving 300.02 F41.1 citalopram (CELEXA) 20 MG tablet  2. Insomnia - unchanged 780.52 G47.00 traZODone (DESYREL) 50 MG tablet    Increase citalopram to 20mg  daily  Start trazodone 1/2 tablet at bedtime. STOP melatonin as it is ineffective  Continue other medications as ordered   Get Tdap from pharmacy. rx given  Follow up in 1 mo for routine visit  Richanda Darin S. [name]  [facility] [address] [place], [place] [address] [phone] Cell (Monday-Friday 8 AM - 5 PM) [phone] After 5 PM and follow prompts

## 2014-11-07 NOTE — Patient Instructions (Addendum)
Increase citalopram to 20mg  daily  Start trazodone 1/2 tablet at bedtime  Continue other medications as ordered   Follow up in 1 mo for routine visit

## 2014-11-09 DIAGNOSIS — H903 Sensorineural hearing loss, bilateral: Secondary | ICD-10-CM | POA: Diagnosis not present

## 2014-11-16 DIAGNOSIS — R339 Retention of urine, unspecified: Secondary | ICD-10-CM | POA: Diagnosis not present

## 2014-11-16 DIAGNOSIS — N4889 Other specified disorders of penis: Secondary | ICD-10-CM | POA: Diagnosis not present

## 2014-11-21 ENCOUNTER — Other Ambulatory Visit: Payer: Self-pay | Admitting: Internal Medicine

## 2014-11-29 DIAGNOSIS — Z08 Encounter for follow-up examination after completed treatment for malignant neoplasm: Secondary | ICD-10-CM | POA: Diagnosis not present

## 2014-11-29 DIAGNOSIS — Z85820 Personal history of malignant melanoma of skin: Secondary | ICD-10-CM | POA: Diagnosis not present

## 2014-11-29 DIAGNOSIS — D0439 Carcinoma in situ of skin of other parts of face: Secondary | ICD-10-CM | POA: Diagnosis not present

## 2014-12-06 DIAGNOSIS — Z8547 Personal history of malignant neoplasm of testis: Secondary | ICD-10-CM | POA: Diagnosis not present

## 2014-12-06 DIAGNOSIS — J449 Chronic obstructive pulmonary disease, unspecified: Secondary | ICD-10-CM | POA: Diagnosis not present

## 2014-12-06 DIAGNOSIS — I10 Essential (primary) hypertension: Secondary | ICD-10-CM | POA: Diagnosis not present

## 2014-12-06 DIAGNOSIS — Y849 Medical procedure, unspecified as the cause of abnormal reaction of the patient, or of later complication, without mention of misadventure at the time of the procedure: Secondary | ICD-10-CM | POA: Diagnosis not present

## 2014-12-06 DIAGNOSIS — N400 Enlarged prostate without lower urinary tract symptoms: Secondary | ICD-10-CM | POA: Diagnosis not present

## 2014-12-06 DIAGNOSIS — N319 Neuromuscular dysfunction of bladder, unspecified: Secondary | ICD-10-CM | POA: Diagnosis not present

## 2014-12-06 DIAGNOSIS — Z8673 Personal history of transient ischemic attack (TIA), and cerebral infarction without residual deficits: Secondary | ICD-10-CM | POA: Diagnosis not present

## 2014-12-06 DIAGNOSIS — T83098A Other mechanical complication of other indwelling urethral catheter, initial encounter: Secondary | ICD-10-CM | POA: Diagnosis not present

## 2014-12-06 DIAGNOSIS — R339 Retention of urine, unspecified: Secondary | ICD-10-CM | POA: Diagnosis not present

## 2014-12-06 DIAGNOSIS — Z87891 Personal history of nicotine dependence: Secondary | ICD-10-CM | POA: Diagnosis not present

## 2014-12-06 DIAGNOSIS — T8389XA Other specified complication of genitourinary prosthetic devices, implants and grafts, initial encounter: Secondary | ICD-10-CM | POA: Diagnosis not present

## 2014-12-14 DIAGNOSIS — R338 Other retention of urine: Secondary | ICD-10-CM | POA: Diagnosis not present

## 2014-12-18 DIAGNOSIS — N3020 Other chronic cystitis without hematuria: Secondary | ICD-10-CM | POA: Diagnosis not present

## 2014-12-18 DIAGNOSIS — N4889 Other specified disorders of penis: Secondary | ICD-10-CM | POA: Diagnosis not present

## 2014-12-20 DIAGNOSIS — R338 Other retention of urine: Secondary | ICD-10-CM | POA: Diagnosis not present

## 2014-12-21 ENCOUNTER — Encounter: Payer: Self-pay | Admitting: Internal Medicine

## 2014-12-21 ENCOUNTER — Ambulatory Visit (INDEPENDENT_AMBULATORY_CARE_PROVIDER_SITE_OTHER): Payer: Medicare HMO | Admitting: Internal Medicine

## 2014-12-21 VITALS — BP 140/76 | HR 88 | Temp 97.9°F | Resp 20 | Ht 73.0 in | Wt 184.6 lb

## 2014-12-21 DIAGNOSIS — Z9289 Personal history of other medical treatment: Secondary | ICD-10-CM

## 2014-12-21 DIAGNOSIS — Z978 Presence of other specified devices: Secondary | ICD-10-CM

## 2014-12-21 DIAGNOSIS — R69 Illness, unspecified: Secondary | ICD-10-CM | POA: Diagnosis not present

## 2014-12-21 DIAGNOSIS — N400 Enlarged prostate without lower urinary tract symptoms: Secondary | ICD-10-CM | POA: Diagnosis not present

## 2014-12-21 DIAGNOSIS — N319 Neuromuscular dysfunction of bladder, unspecified: Secondary | ICD-10-CM | POA: Diagnosis not present

## 2014-12-21 DIAGNOSIS — F411 Generalized anxiety disorder: Secondary | ICD-10-CM

## 2014-12-21 DIAGNOSIS — G4700 Insomnia, unspecified: Secondary | ICD-10-CM | POA: Diagnosis not present

## 2014-12-21 DIAGNOSIS — I10 Essential (primary) hypertension: Secondary | ICD-10-CM | POA: Diagnosis not present

## 2014-12-21 DIAGNOSIS — K219 Gastro-esophageal reflux disease without esophagitis: Secondary | ICD-10-CM | POA: Diagnosis not present

## 2014-12-21 DIAGNOSIS — E785 Hyperlipidemia, unspecified: Secondary | ICD-10-CM

## 2014-12-21 DIAGNOSIS — J439 Emphysema, unspecified: Secondary | ICD-10-CM | POA: Diagnosis not present

## 2014-12-21 DIAGNOSIS — H543 Unqualified visual loss, both eyes: Secondary | ICD-10-CM | POA: Diagnosis not present

## 2014-12-21 DIAGNOSIS — Z960 Presence of urogenital implants: Secondary | ICD-10-CM

## 2014-12-21 NOTE — Patient Instructions (Addendum)
Continue current medications as ordered  Follow up with urology as scheduled  Continue foley cath care as indicated  Follow up in 3 mos for routine visit

## 2014-12-21 NOTE — Progress Notes (Signed)
Patient ID: Ricardo [name], male   DOB: 19-Aug-1935, 79 y.o.   MRN: [id number]    Location:    [facility]   Place of Service:   OFFICE  Chief Complaint  Patient presents with  . Medical Management of Chronic Issues    1 month follow-up for anxiety and insomnia  . OTHER    Daughter in room with patient    HPI:  79 yo male seen today for f/u anxiety/insomnia. He reports foley cath troubles this week. He had to go to the ER once for it. He also saw urology Dr [name] and he was told to irrigate with vinegar water. Last night he had urinary urgency with pain but foley was draining well. Foley cath changed yesterday by urology. He also notes increased flatulence with constipation x several days. Last night he had 3 large solid stools in 12 hrs. He cont to have neck pain but no worse than usual. He cont to walk 3 times per week and use dumbbells. He reduced serevent to 3 times per week but has not noticed any changes in breathing. Sleeping better since he stopped looking at his clock and trazodone added. Anxiety improved on celexa   His daughter, primary caregiver, present today.  Past Medical History  Diagnosis Date  . Rotator cuff tear     right  . Hypertension   . High cholesterol   . Meningitis   . Enlarged prostate   . 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 Deaf     due to meningitis  . Loss of balance     due to meningitis  . Unable to walk     due to meningitis  . Eczema   . Cancer [facility]) 1956    left testicle  . Skin cancer   . Neurogenic bladder     History of    Past Surgical History  Procedure Laterality Date  . Appendectomy    . Prostate surgery      biopsy x 2  . Tonsillectomy      childhood  . Testicle removal Left     October 1956  . Cochlear implant    . Cystoscopy with litholapaxy N/A 05/14/2014    Procedure: CYSTOSCOPY WITH LITHOLAPAXY;  Surgeon: [name], MD;  Location: [facility];  Service: Urology;  Laterality: N/A;  . Insertion of suprapubic catheter N/A  05/14/2014    Procedure: INSERTION OF SUPRAPUBIC CATHETER;  Surgeon: [name], MD;  Location: [facility];  Service: Urology;  Laterality: N/A;    Patient Care Team: [name], DO as PCP - General (Internal Medicine) [name], MD as Consulting Physician (Urology)  Social History   Social History  . Marital Status: Widowed    Spouse Name: N/A  . Number of Children: N/A  . Years of Education: N/A   Occupational History  . Not on file.   Social History Main Topics  . Smoking status: Former Smoker -- 10 years    Types: Cigarettes    Start date: 02/10/1955    Quit date: 02/09/1965  . Smokeless tobacco: Never Used  . Alcohol Use: No  . Drug Use: No  . Sexual Activity: Not on file   Other Topics Concern  . Not on file   Social History Narrative   Diet- Fairly balanced   Caffeine- Yes, Coffee   Married- 1964, [place] living now with daughter, all on the first floor   Pets-4, dog and  Radiation protection practitioner, English as a second language teacher for [employer], Chief Financial Officer, Freight forwarder   Exercise- Yes, Rehab 3 times a week   Living will- Yes   DNR- Yes   POA/HPOA- Yes           reports that he quit smoking about 49 years ago. His smoking use included Cigarettes. He started smoking about 59 years ago. He quit after 10 years of use. He has never used smokeless tobacco. He reports that he does not drink alcohol or use illicit drugs.  Allergies  Allergen Reactions  . Peanut Oil Shortness Of Breath and Swelling    Itching  . Peanut-Containing Drug Products Shortness Of Breath and Swelling    Medications: Patient's Medications  New Prescriptions   No medications on file  Previous Medications   ASPIRIN 81 MG CHEWABLE TABLET    Take one tablet by mouth once daily   ATORVASTATIN (LIPITOR) 10 MG TABLET    TAKE 1 TABLET (10 MG TOTAL) BY MOUTH AT BEDTIME.   CALCIUM 250 MG CAPS    Take 1 capsule by mouth daily.   CHOLECALCIFEROL ([employer] D3) 5000 UNITS CAPSULE    Take  5,000 Units by mouth every morning.    CITALOPRAM (CELEXA) 20 MG TABLET    Take 1 tablet (20 mg total) by mouth daily.   LACTOBACILLUS (ACIDOPHILUS) 100 MG CAPS    Take 100 mg by mouth daily.   MELATONIN 10 MG TABS    Take 1 one tablet by mouth at bedtime   MULTIPLE VITAMIN (MULTIVITAMIN WITH MINERALS) TABS TABLET    Take 1 tablet by mouth daily.   PANTOPRAZOLE (PROTONIX) 40 MG TABLET    Take one tablet by mouth once daily for stomach   SALMETEROL (SEREVENT) 50 MCG/DOSE DISKUS INHALER    Inhale 1 puff into the lungs 2 (two) times daily.   TDAP (BOOSTRIX) 5-2.5-18.5 LF-MCG/0.5 INJECTION    Inject 0.5 mLs into the muscle once.   TRAZODONE (DESYREL) 50 MG TABLET    Take 1 tablet (50 mg total) by mouth at bedtime.   TRIMETHOPRIM (TRIMPEX) 100 MG TABLET    Take 1 tablet (100 mg total) by mouth every morning.  Modified Medications   No medications on file  Discontinued Medications   No medications on file    Review of Systems  Constitutional: Negative for fever, chills, activity change and fatigue.  HENT: Negative for sore throat and trouble swallowing.   Eyes: Positive for visual disturbance.  Respiratory: Negative for cough, chest tightness and shortness of breath.   Cardiovascular: Negative for chest pain, palpitations and leg swelling.  Gastrointestinal: Positive for constipation and abdominal distention. Negative for nausea, vomiting and blood in stool.  Genitourinary: Negative for urgency, frequency and difficulty urinating.  Musculoskeletal: Negative for arthralgias and gait problem.  Skin: Negative for rash.  Neurological: Negative for weakness and headaches.  Psychiatric/Behavioral: Positive for sleep disturbance. Negative for confusion. The patient is not nervous/anxious.     Filed Vitals:   12/21/14 0911  BP: 140/76  Pulse: 88  Temp: 97.9 F (36.6 C)  TempSrc: Oral  Resp: 20  Height: 6\' 1"  (1.854 m)  Weight: 184 lb 9.6 oz (83.734 kg)  SpO2: 97%   Body mass index is  24.36 kg/(m^2).  Physical Exam  Constitutional: He is oriented to person, place, and time. He appears well-developed and well-nourished.  HENT:  Mouth/Throat: Oropharynx is clear and moist.  Eyes: Pupils are equal, round, and reactive to light. No scleral icterus.  Neck:  Neck supple. Carotid bruit is not present. No thyromegaly present.  Cardiovascular: Normal rate, regular rhythm, normal heart sounds and intact distal pulses.  Exam reveals no gallop and no friction rub.   No murmur heard. no distal LE swelling. No calf TTP  Pulmonary/Chest: Effort normal and breath sounds normal. He has no wheezes. He has no rales. He exhibits no tenderness.  Abdominal: Soft. Bowel sounds are normal. He exhibits distension. He exhibits no abdominal bruit, no pulsatile midline mass and no mass. There is tenderness. There is no rebound and no guarding.  Genitourinary:  Suprapubic foley cath DTG, yellow urine with min sediment. Min BRB but no clots  Lymphadenopathy:    He has no cervical adenopathy.  Neurological: He is alert and oriented to person, place, and time.  Skin: Skin is warm and dry. No rash noted.  Psychiatric: He has a normal mood and affect. His behavior is normal. Judgment and thought content normal.     Labs reviewed: Lab on 10/04/2014  Component Date Value Ref Range Status  . WBC 10/04/2014 8.4  3.4 - 10.8 x10E3/uL Final  . RBC 10/04/2014 4.78  4.14 - 5.80 x10E6/uL Final  . Hemoglobin 10/04/2014 14.4  12.6 - 17.7 g/dL Final  . Hematocrit 10/04/2014 42.1  37.5 - 51.0 % Final  . MCV 10/04/2014 88  79 - 97 fL Final  . MCH 10/04/2014 30.1  26.6 - 33.0 pg Final  . MCHC 10/04/2014 34.2  31.5 - 35.7 g/dL Final  . RDW 10/04/2014 13.3  12.3 - 15.4 % Final  . Platelets 10/04/2014 268  150 - 379 x10E3/uL Final  . Neutrophils 10/04/2014 65   Final  . Lymphs 10/04/2014 23   Final  . Monocytes 10/04/2014 10   Final  . Eos 10/04/2014 2   Final  . Basos 10/04/2014 0   Final  . Neutrophils  Absolute 10/04/2014 5.4  1.4 - 7.0 x10E3/uL Final  . Lymphocytes Absolute 10/04/2014 1.9  0.7 - 3.1 x10E3/uL Final  . Monocytes Absolute 10/04/2014 0.9  0.1 - 0.9 x10E3/uL Final  . EOS (ABSOLUTE) 10/04/2014 0.2  0.0 - 0.4 x10E3/uL Final  . Basophils Absolute 10/04/2014 0.0  0.0 - 0.2 x10E3/uL Final  . Immature Granulocytes 10/04/2014 0   Final  . Immature Grans (Abs) 10/04/2014 0.0  0.0 - 0.1 x10E3/uL Final  . Cholesterol, Total 10/04/2014 161  100 - 199 mg/dL Final  . Triglycerides 10/04/2014 96  0 - 149 mg/dL Final  . HDL 10/04/2014 65  >39 mg/dL Final   Comment: According to ATP-III Guidelines, HDL-C >59 mg/dL is considered a negative risk factor for CHD.   Marland Kitchen VLDL Cholesterol Cal 10/04/2014 19  5 - 40 mg/dL Final  . LDL Calculated 10/04/2014 77  0 - 99 mg/dL Final  . Chol/HDL Ratio 10/04/2014 2.5  0.0 - 5.0 ratio units Final   Comment:                                   T. Chol/HDL Ratio                                             Men  Women  1/2 Avg.Risk  3.4    3.3                                   Avg.Risk  5.0    4.4                                2X Avg.Risk  9.6    7.1                                3X Avg.Risk 23.4   11.0   . Glucose 10/04/2014 109* 65 - 99 mg/dL Final  . BUN 10/04/2014 13  8 - 27 mg/dL Final  . Creatinine, Ser 10/04/2014 1.03  0.76 - 1.27 mg/dL Final  . GFR calc non Af Amer 10/04/2014 70  >59 mL/min/1.73 Final  . GFR calc Af Amer 10/04/2014 81  >59 mL/min/1.73 Final  . BUN/Creatinine Ratio 10/04/2014 13  10 - 22 Final  . Sodium 10/04/2014 135  134 - 144 mmol/L Final  . Potassium 10/04/2014 4.3  3.5 - 5.2 mmol/L Final  . Chloride 10/04/2014 93* 97 - 108 mmol/L Final  . CO2 10/04/2014 23  18 - 29 mmol/L Final  . Calcium 10/04/2014 9.2  8.6 - 10.2 mg/dL Final  . Total Protein 10/04/2014 6.8  6.0 - 8.5 g/dL Final  . Albumin 10/04/2014 4.4  3.5 - 4.8 g/dL Final  . Globulin, Total 10/04/2014 2.4  1.5 - 4.5 g/dL Final  .  Albumin/Globulin Ratio 10/04/2014 1.8  1.1 - 2.5 Final  . Bilirubin Total 10/04/2014 0.5  0.0 - 1.2 mg/dL Final  . Alkaline Phosphatase 10/04/2014 81  39 - 117 IU/L Final  . AST 10/04/2014 15  0 - 40 IU/L Final  . ALT 10/04/2014 15  0 - 44 IU/L Final    No results found.   Assessment/Plan   ICD-9-CM ICD-10-CM   1. Essential hypertension - BP elevated due to difficulty sleeping; no change in meds 401.9 I10   2. Foley catheter in place at suprapubic due to #3 V45.89 Z92.89   3. Neurogenic bladder s/p suprapubic cath - stable 596.54 N31.9   4. Generalized anxiety disorder - improved 300.02 F41.1   5. Insomnia - improved overall 780.52 G47.00   6. Hyperlipidemia - stable 272.4 E78.5   7. Gastroesophageal reflux disease without esophagitis - stable 530.81 K21.9   8. BPH (benign prostatic hypertrophy) - stable 600.00 N40.0   9. Pulmonary emphysema, unspecified emphysema type ([place]) - stable 492.8 J43.9   10. Impaired vision in both eyes - worsening peripheral vision per pt 369.3 H54.3    Continue current medications as ordered  Follow up with urology as scheduled  F/u with eye specialist as scheduled  Continue foley cath care as indicated  Follow up in 3 mos for routine visit  Leasa Kincannon S. [name]  [facility] [address] [place], [place] [address] [phone] Cell (Monday-Friday 8 AM - 5 PM) [phone] After 5 PM and follow prompts

## 2014-12-29 ENCOUNTER — Other Ambulatory Visit: Payer: Self-pay | Admitting: Internal Medicine

## 2015-01-13 DIAGNOSIS — Z79899 Other long term (current) drug therapy: Secondary | ICD-10-CM | POA: Diagnosis not present

## 2015-01-13 DIAGNOSIS — Z91010 Allergy to peanuts: Secondary | ICD-10-CM | POA: Diagnosis not present

## 2015-01-13 DIAGNOSIS — Z7982 Long term (current) use of aspirin: Secondary | ICD-10-CM | POA: Diagnosis not present

## 2015-01-13 DIAGNOSIS — H540 Blindness, both eyes: Secondary | ICD-10-CM | POA: Diagnosis not present

## 2015-01-13 DIAGNOSIS — I10 Essential (primary) hypertension: Secondary | ICD-10-CM | POA: Diagnosis not present

## 2015-01-13 DIAGNOSIS — Z8547 Personal history of malignant neoplasm of testis: Secondary | ICD-10-CM | POA: Diagnosis not present

## 2015-01-13 DIAGNOSIS — Z87891 Personal history of nicotine dependence: Secondary | ICD-10-CM | POA: Diagnosis not present

## 2015-01-13 DIAGNOSIS — N3090 Cystitis, unspecified without hematuria: Secondary | ICD-10-CM | POA: Diagnosis not present

## 2015-01-13 DIAGNOSIS — J449 Chronic obstructive pulmonary disease, unspecified: Secondary | ICD-10-CM | POA: Diagnosis not present

## 2015-01-13 DIAGNOSIS — Z8673 Personal history of transient ischemic attack (TIA), and cerebral infarction without residual deficits: Secondary | ICD-10-CM | POA: Diagnosis not present

## 2015-01-16 ENCOUNTER — Telehealth: Payer: Self-pay | Admitting: *Deleted

## 2015-01-16 NOTE — Telephone Encounter (Signed)
Patient's daughter Ricardo [name]) call to check and see if patient need any other medication for his UTI. He was given Keflex at [facility] ). She stated that they did not received an final report regarding his urinary tract infection.Please Advise!

## 2015-01-17 NOTE — Telephone Encounter (Signed)
She needs to call the provider who Rx abx. I have not seen him for this event.

## 2015-01-18 NOTE — Telephone Encounter (Signed)
Explained to the daughter that we have not received any reports from the [facility] system, and it would be best to speak with the provider that gave the medication.

## 2015-01-22 ENCOUNTER — Encounter: Payer: Self-pay | Admitting: Nurse Practitioner

## 2015-01-22 ENCOUNTER — Ambulatory Visit (INDEPENDENT_AMBULATORY_CARE_PROVIDER_SITE_OTHER): Payer: Medicare HMO | Admitting: Nurse Practitioner

## 2015-01-22 VITALS — BP 150/100 | HR 82 | Temp 97.5°F | Resp 20 | Ht 73.0 in | Wt 180.6 lb

## 2015-01-22 DIAGNOSIS — I10 Essential (primary) hypertension: Secondary | ICD-10-CM

## 2015-01-22 DIAGNOSIS — T83511A Infection and inflammatory reaction due to indwelling urethral catheter, initial encounter: Secondary | ICD-10-CM | POA: Diagnosis not present

## 2015-01-22 DIAGNOSIS — N390 Urinary tract infection, site not specified: Secondary | ICD-10-CM | POA: Diagnosis not present

## 2015-01-22 LAB — POCT URINALYSIS DIPSTICK
Bilirubin, UA: NEGATIVE
Glucose, UA: NEGATIVE
Nitrite, UA: NEGATIVE
Spec Grav, UA: 1.025
Urobilinogen, UA: 0.2
pH, UA: 6

## 2015-01-22 MED ORDER — LOSARTAN POTASSIUM 25 MG PO TABS: 25.0000 mg | ORAL_TABLET | Freq: Every day | ORAL | Status: DC

## 2015-01-22 NOTE — Patient Instructions (Signed)
Will restart losartan at 25 mg daily  Check blood pressure a few times a week and record and bring to next visit

## 2015-01-22 NOTE — Progress Notes (Signed)
Patient ID: Ricardo [name], male   DOB: [birth year], 79 y.o.   MRN: [id number]    PCP: [name], DO  Advanced Directive information Does patient have an advance directive?: No, Does patient want to make changes to advanced directive?: Yes - information given  Allergies  Allergen Reactions  . Peanut Oil Shortness Of Breath and Swelling    Itching  . Peanut-Containing Drug Products Shortness Of Breath and Swelling    Chief Complaint  Patient presents with  . Follow-up    Hospital follow-up for UTI  . OTHER    Daughter in room with patient, Advanced Directive paper work given      HPI: Patient is a 79 y.o. male seen in the office today to follow up ED visit due to UTI. Pt here with daughter. Increase sediment in urine and visits were made to ED and urology but no one took urine for UA. Daughter started irrigating foley and sediment improved. Pt was placed on keflex and completed treatment.  Daughter brought in urine that she removed bag and got from catheter. Appears to be similar to UA from ED.  Pt has follow up with urology for catheter change in 2 days.  No fevers or chills. No abdominal pain. Bladder spasms have stopped.   Was taking off his losartan during hospitalization back in March due to soft blood pressure. Blood pressure has been gradually going up.   Review of Systems:  Review of Systems  Constitutional: Negative for fever, chills, activity change and fatigue.  HENT: Negative for sore throat and trouble swallowing.   Respiratory: Negative for cough, chest tightness and shortness of breath.   Cardiovascular: Negative for chest pain, palpitations and leg swelling.  Gastrointestinal: Negative for nausea, vomiting, diarrhea and blood in stool.  Genitourinary: Negative for urgency.       Chronic foley  Musculoskeletal: Negative for arthralgias and gait problem.  Skin: Negative for rash.  Neurological: Negative for weakness and headaches.  Psychiatric/Behavioral:  Negative for confusion. The patient is not nervous/anxious.     Past Medical History  Diagnosis Date  . Rotator cuff tear     right  . Hypertension   . High cholesterol   . Meningitis   . Enlarged prostate   . 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 Deaf     due to meningitis  . Loss of balance     due to meningitis  . Unable to walk     due to meningitis  . Eczema   . Cancer [facility]) 1956    left testicle  . Skin cancer   . Neurogenic bladder     History of   Past Surgical History  Procedure Laterality Date  . Appendectomy    . Prostate surgery      biopsy x 2  . Tonsillectomy      childhood  . Testicle removal Left     October 1956  . Cochlear implant    . Cystoscopy with litholapaxy N/A 05/14/2014    Procedure: CYSTOSCOPY WITH LITHOLAPAXY;  Surgeon: [name], MD;  Location: [facility];  Service: Urology;  Laterality: N/A;  . Insertion of suprapubic catheter N/A 05/14/2014    Procedure: INSERTION OF SUPRAPUBIC CATHETER;  Surgeon: [name], MD;  Location: [facility];  Service: Urology;  Laterality: N/A;   Social History:   reports that he quit smoking about 49 years ago. His smoking use included Cigarettes. He started smoking about 59 years ago. He  quit after 10 years of use. He has never used smokeless tobacco. He reports that he does not drink alcohol or use illicit drugs.  Family History  Problem Relation Age of Onset  . Heart failure Father   . Stroke Mother     Medications: Patient's Medications  New Prescriptions   No medications on file  Previous Medications   ASPIRIN 81 MG CHEWABLE TABLET    Take one tablet by mouth once daily   ATORVASTATIN (LIPITOR) 10 MG TABLET    TAKE 1 TABLET (10 MG TOTAL) BY MOUTH AT BEDTIME.   CALCIUM 250 MG CAPS    Take 1 capsule by mouth daily.   CHOLECALCIFEROL ([employer] D3) 5000 UNITS CAPSULE    Take 5,000 Units by mouth every morning.    CITALOPRAM (CELEXA) 20 MG TABLET    Take 1 tablet (20 mg total) by mouth daily.    LACTOBACILLUS (ACIDOPHILUS) 100 MG CAPS    Take 100 mg by mouth daily.   MELATONIN 10 MG TABS    Take 1 one tablet by mouth at bedtime   MULTIPLE VITAMIN (MULTIVITAMIN WITH MINERALS) TABS TABLET    Take 1 tablet by mouth daily.   PANTOPRAZOLE (PROTONIX) 40 MG TABLET    Take one tablet by mouth once daily for stomach   SALMETEROL (SEREVENT) 50 MCG/DOSE DISKUS INHALER    Inhale 1 puff into the lungs 2 (two) times daily.   TDAP (BOOSTRIX) 5-2.5-18.5 LF-MCG/0.5 INJECTION    Inject 0.5 mLs into the muscle once.   TRAZODONE (DESYREL) 50 MG TABLET    Take 1 tablet (50 mg total) by mouth at bedtime.   TRIMETHOPRIM (TRIMPEX) 100 MG TABLET    Take 1 tablet (100 mg total) by mouth every morning.  Modified Medications   No medications on file  Discontinued Medications   No medications on file     Physical Exam:  Filed Vitals:   01/22/15 1030  BP: 150/100  Pulse: 82  Temp: 97.5 F (36.4 C)  TempSrc: Oral  Resp: 20  Height: 6\' 1"  (1.854 m)  Weight: 180 lb 9.6 oz (81.92 kg)  SpO2: 97%   Body mass index is 23.83 kg/(m^2).  Physical Exam  Constitutional: He is oriented to person, place, and time. He appears well-developed and well-nourished. No distress.  HENT:  Head: Normocephalic and atraumatic.  Cardiovascular: Normal rate, regular rhythm and normal heart sounds.   Pulmonary/Chest: Effort normal and breath sounds normal.  Abdominal: Soft. Bowel sounds are normal. He exhibits no distension. There is no tenderness.  Genitourinary:  Foley catheter with clear yellow urine    Musculoskeletal: He exhibits no edema.  Neurological: He is alert and oriented to person, place, and time.  Skin: Skin is warm and dry. He is not diaphoretic.  Psychiatric: He has a normal mood and affect.   Labs reviewed: Basic Metabolic Panel:  Recent Labs  05/11/14 1139 05/14/14 0730 10/04/14 1028  NA 132* 132* 135  K 4.8 4.5 4.3  CL 91* 97 93*  CO2 23 26 23   GLUCOSE 111* 123* 109*  BUN 16 17 13     CREATININE 0.98 1.36* 1.03  CALCIUM 9.5 9.0 9.2   Liver Function Tests:  Recent Labs  04/24/14 0530 05/11/14 1139 10/04/14 1028  AST 46* 15 15  ALT 149* 11 15  ALKPHOS 162* 102 81  BILITOT 0.7 0.2 0.5  PROT 5.7* 7.0 6.8  ALBUMIN 3.1* 4.6 4.4   No results for input(s): LIPASE, AMYLASE in the last  8760 hours. No results for input(s): AMMONIA in the last 8760 hours. CBC:  Recent Labs  04/21/14 1518 04/22/14 0559 04/23/14 0505 05/14/14 0730 10/04/14 1035  WBC 11.3* 6.8 5.6 6.0 8.4  NEUTROABS 9.9*  --   --   --  5.4  HGB 11.7* 10.9* 9.4* 12.4*  --   HCT 33.0* 31.1* 27.1* 36.8* 42.1  MCV 89.4 88.1 88.6 89.5  --   PLT 280 199 201 250  --    Lipid Panel:  Recent Labs  06/22/14 1008 10/04/14 1035  CHOL 151 161  HDL 73 65  LDLCALC 54 77  TRIG 122 96  CHOLHDL 2.1 2.5   TSH: No results for input(s): TSH in the last 8760 hours. A1C: Lab Results  Component Value Date   HGBA1C 6.0* 04/22/2014     Assessment/Plan 1. Urinary tract infection associated with catheterization of urinary tract, initial encounter -stable completed keflex, culture was negative. Urine is clear yellow today in foley bag - POC Urinalysis Dipstick appears similar to UA in ED, will not send off for culture due to being obtained from catheter at home. Daughter reports urology follow up in 2 days   2. Essential hypertension -taken off medication in hospital back in March, gradual increase since. Pt previously on losartan 50 mg daily -will restart losartan 25 mg daily and daughter to monitor blood pressure at home. Notify if sbp remaining over 140.  - losartan (COZAAR) 25 MG tablet; Take 1 tablet (25 mg total) by mouth daily.  Dispense: 30 tablet; Refill: 1  To keep follow up with Dr Thayer Headings K. Harle Battiest  [facility] [phone] 8 am - 5 pm) [phone] (after hours)

## 2015-01-24 DIAGNOSIS — N4889 Other specified disorders of penis: Secondary | ICD-10-CM | POA: Diagnosis not present

## 2015-01-24 DIAGNOSIS — N3020 Other chronic cystitis without hematuria: Secondary | ICD-10-CM | POA: Diagnosis not present

## 2015-02-26 DIAGNOSIS — R338 Other retention of urine: Secondary | ICD-10-CM | POA: Diagnosis not present

## 2015-02-27 ENCOUNTER — Telehealth: Payer: Self-pay

## 2015-02-27 NOTE — Telephone Encounter (Signed)
Patients daughter called to find out why her father's medication had been denied, but after talking to the pharmacy she found they had sent the request to the wrong office.

## 2015-02-28 ENCOUNTER — Other Ambulatory Visit: Payer: Self-pay

## 2015-02-28 ENCOUNTER — Other Ambulatory Visit: Payer: Self-pay | Admitting: Internal Medicine

## 2015-02-28 MED ORDER — SALMETEROL XINAFOATE 50 MCG/DOSE IN AEPB: 1.0000 | INHALATION_SPRAY | Freq: Two times a day (BID) | RESPIRATORY_TRACT | Status: DC

## 2015-03-01 ENCOUNTER — Ambulatory Visit (HOSPITAL_COMMUNITY)
Admission: RE | Admit: 2015-03-01 | Discharge: 2015-03-01 | Disposition: A | Discharge status: Home / Self Care | Payer: Medicare HMO | Source: Ambulatory Visit | Attending: Internal Medicine | Admitting: Internal Medicine

## 2015-03-01 ENCOUNTER — Encounter: Payer: Self-pay | Admitting: Internal Medicine

## 2015-03-01 ENCOUNTER — Ambulatory Visit (INDEPENDENT_AMBULATORY_CARE_PROVIDER_SITE_OTHER): Payer: Medicare HMO | Admitting: Internal Medicine

## 2015-03-01 VITALS — BP 122/74 | HR 80 | Temp 97.7°F | Resp 20

## 2015-03-01 DIAGNOSIS — J439 Emphysema, unspecified: Secondary | ICD-10-CM

## 2015-03-01 DIAGNOSIS — I10 Essential (primary) hypertension: Secondary | ICD-10-CM

## 2015-03-01 DIAGNOSIS — M25472 Effusion, left ankle: Secondary | ICD-10-CM | POA: Diagnosis not present

## 2015-03-01 DIAGNOSIS — M7989 Other specified soft tissue disorders: Secondary | ICD-10-CM | POA: Diagnosis not present

## 2015-03-01 DIAGNOSIS — Z85820 Personal history of malignant melanoma of skin: Secondary | ICD-10-CM | POA: Diagnosis not present

## 2015-03-01 NOTE — Patient Instructions (Signed)
Will call with results  Continue current medications as ordered  Follow up as scheduled

## 2015-03-01 NOTE — Progress Notes (Signed)
VASCULAR LAB PRELIMINARY  PRELIMINARY  PRELIMINARY  PRELIMINARY  Left lower extremity venous duplex completed.    Preliminary report:  Left:  No evidence of DVT, superficial thrombosis, or Baker's cyst.  Montee Tallman, RVS 03/01/2015, 10:39 AM

## 2015-03-01 NOTE — Progress Notes (Signed)
Patient ID: Ricardo [name], male   DOB: Jul 18, 1935, 80 y.o.   MRN: [id number]    Location:    [facility]    Place of Service:   OFFICE  Chief Complaint  Patient presents with  . Acute Visit    Patients c/o Left foot swollen noticed it tuesday evening  . OTHER    Daughter in room with patient    HPI:  80 yo male seen today for sudden onset left foot swelling x 4 days. No pain with weightbearing. No falls. No calf pain but has had b/l cramps in calves intermittently. No f/c. Increased SOB and increased HFA to BID instead of daily but attributes increased use to change in weather. No CP. He is legally blind. Hx emphysema and melanoma. He walks about 4 miles per week with his walker. Daughter present  HTN - BP stable on losartan  Neurogenic bladder - foley cath is chronic. Takes trimethoprim for prevention  Past Medical History  Diagnosis Date  . Rotator cuff tear     right  . Hypertension   . High cholesterol   . Meningitis   . Enlarged prostate   . 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 Deaf     due to meningitis  . Loss of balance     due to meningitis  . Unable to walk     due to meningitis  . Eczema   . Cancer [facility]) 1956    left testicle  . Skin cancer   . Neurogenic bladder     History of    Past Surgical History  Procedure Laterality Date  . Appendectomy    . Prostate surgery      biopsy x 2  . Tonsillectomy      childhood  . Testicle removal Left     October 1956  . Cochlear implant    . Cystoscopy with litholapaxy N/A 05/14/2014    Procedure: CYSTOSCOPY WITH LITHOLAPAXY;  Surgeon: [name], MD;  Location: [facility];  Service: Urology;  Laterality: N/A;  . Insertion of suprapubic catheter N/A 05/14/2014    Procedure: INSERTION OF SUPRAPUBIC CATHETER;  Surgeon: [name], MD;  Location: [facility];  Service: Urology;  Laterality: N/A;    Patient Care Team: [name], DO as PCP - General (Internal Medicine) [name], MD as Consulting  Physician (Urology)  Social History   Social History  . Marital Status: Widowed    Spouse Name: N/A  . Number of Children: N/A  . Years of Education: N/A   Occupational History  . Not on file.   Social History Main Topics  . Smoking status: Former Smoker -- 10 years    Types: Cigarettes    Start date: 02/10/1955    Quit date: 02/09/1965  . Smokeless tobacco: Never Used  . Alcohol Use: No  . Drug Use: No  . Sexual Activity: Not on file   Other Topics Concern  . Not on file   Social History Narrative   Diet- Fairly balanced   Caffeine- Yes, Coffee   Married- 1964, [place] living now with daughter, all on the first floor   Pets-4, dog and Radiation protection practitioner, English as a second language teacher for [employer], Chief Financial Officer, Freight forwarder   Exercise- Yes, Rehab 3 times a week   Living will- Yes   DNR- Yes   POA/HPOA- Yes           reports that he quit smoking about 50 years  ago. His smoking use included Cigarettes. He started smoking about 60 years ago. He quit after 10 years of use. He has never used smokeless tobacco. He reports that he does not drink alcohol or use illicit drugs.  Allergies  Allergen Reactions  . Peanut Oil Shortness Of Breath and Swelling    Itching  . Peanut-Containing Drug Products Shortness Of Breath and Swelling    Medications: Patient's Medications  New Prescriptions   No medications on file  Previous Medications   ASPIRIN 81 MG CHEWABLE TABLET    Take one tablet by mouth once daily   ATORVASTATIN (LIPITOR) 10 MG TABLET    TAKE 1 TABLET (10 MG TOTAL) BY MOUTH AT BEDTIME.   CALCIUM 250 MG CAPS    Take 1 capsule by mouth daily.   CHOLECALCIFEROL ([employer]S D3) 5000 UNITS CAPSULE    Take 5,000 Units by mouth every morning.    CITALOPRAM (CELEXA) 20 MG TABLET    Take 1 tablet (20 mg total) by mouth daily.   LACTOBACILLUS (ACIDOPHILUS) 100 MG CAPS    Take 100 mg by mouth daily.   LOSARTAN (COZAAR) 25 MG TABLET    Take 1 tablet (25 mg total) by mouth  daily.   MELATONIN 10 MG TABS    Take 1 one tablet by mouth at bedtime   MULTIPLE VITAMIN (MULTIVITAMIN WITH MINERALS) TABS TABLET    Take 1 tablet by mouth daily.   PANTOPRAZOLE (PROTONIX) 40 MG TABLET    Take one tablet by mouth once daily for stomach   SALMETEROL (SEREVENT) 50 MCG/DOSE DISKUS INHALER    Inhale 1 puff into the lungs 2 (two) times daily.   TDAP (BOOSTRIX) 5-2.5-18.5 LF-MCG/0.5 INJECTION    Inject 0.5 mLs into the muscle once.   TRAZODONE (DESYREL) 50 MG TABLET    Take 1 tablet (50 mg total) by mouth at bedtime.   TRIMETHOPRIM (TRIMPEX) 100 MG TABLET    Take 1 tablet (100 mg total) by mouth every morning.  Modified Medications   No medications on file  Discontinued Medications   No medications on file    Review of Systems  Constitutional: Negative for chills, activity change and fatigue.  HENT: Negative for sore throat and trouble swallowing.   Eyes: Negative for visual disturbance.  Respiratory: Positive for shortness of breath. Negative for cough and chest tightness.   Cardiovascular: Positive for leg swelling. Negative for chest pain and palpitations.  Gastrointestinal: Negative for nausea, vomiting, abdominal pain and blood in stool.  Genitourinary: Negative for urgency, frequency and difficulty urinating.  Musculoskeletal: Positive for gait problem. Negative for arthralgias.  Skin: Negative for rash.  Neurological: Negative for weakness and headaches.  Psychiatric/Behavioral: Negative for confusion and sleep disturbance. The patient is not nervous/anxious.     Filed Vitals:   03/01/15 0820  BP: 122/74  Pulse: 80  Temp: 97.7 F (36.5 C)  TempSrc: Oral  Resp: 20  SpO2: 98%   There is no weight on file to calculate BMI.  Physical Exam  Constitutional: He is oriented to person, place, and time. He appears well-developed and well-nourished.  Sitting in w/c in NAD  Cardiovascular: Normal rate, regular rhythm and intact distal pulses.  Exam reveals no  decreased pulses.   Murmur (1/6 SEM) heard. Pulses:      Dorsalis pedis pulses are 2+ on the right side, and 2+ on the left side.       Posterior tibial pulses are 2+ on the right side, and 2+ on  the left side.  Left calf swelling with palpable cord. Varicose veins present left calf. NT. Neg Homan's sign. Right calf soft, NT and no swelling right ankle FROM. Medial left ankle swelling with TTP behind medial malleolus  Pulmonary/Chest: Effort normal and breath sounds normal. No respiratory distress. He has no wheezes. He has no rales. He exhibits no tenderness.  Genitourinary:  Foley cath intact and DTG clear urine  Musculoskeletal: He exhibits edema and tenderness.  Decreased ROM left ankle with foot deformity  Neurological: He is alert and oriented to person, place, and time.  Skin: Skin is warm and dry. No rash noted.  Psychiatric: He has a normal mood and affect. His behavior is normal. Thought content normal.     Labs reviewed: Office Visit on 01/22/2015  Component Date Value Ref Range Status  . Color, UA 01/22/2015 Yellow   Final  . Clarity, UA 01/22/2015 Clear   Final  . Glucose, UA 01/22/2015 Negative   Final  . Bilirubin, UA 01/22/2015 Negative   Final  . Ketones, UA 01/22/2015 Trace   Final  . Spec Grav, UA 01/22/2015 1.025   Final  . Blood, UA 01/22/2015 Moderate   Final  . pH, UA 01/22/2015 6.0   Final  . Protein, UA 01/22/2015 Trace   Final  . Urobilinogen, UA 01/22/2015 0.2   Final  . Nitrite, UA 01/22/2015 Negative   Final  . Leukocytes, UA 01/22/2015 large (3+)* Negative Final    No results found.   Assessment/Plan   ICD-9-CM ICD-10-CM   1. Left ankle swelling 719.07 M25.472 VAS [place] LOWER EXTREMITY VENOUS (DVT)  2. Calf swelling 729.81 M79.89 VAS [place] LOWER EXTREMITY VENOUS (DVT)   left   3. Pulmonary emphysema, unspecified emphysema type ([place]) 492.8 J43.9   4. Essential hypertension 401.9 I10   5. History of melanoma V10.82 Z85.820    Will call with  results  T/c left ankle xray if swelling and pain persists and [place] neg for DVT  Continue current medications as ordered  Follow up as scheduled  Mackensey Bolte S. [name]  [facility] [address] [place], [place] [address] [phone] Cell (Monday-Friday 8 AM - 5 PM) [phone] After 5 PM and follow prompts

## 2015-03-08 DIAGNOSIS — R338 Other retention of urine: Secondary | ICD-10-CM | POA: Diagnosis not present

## 2015-03-13 ENCOUNTER — Ambulatory Visit (INDEPENDENT_AMBULATORY_CARE_PROVIDER_SITE_OTHER): Payer: Medicare HMO | Admitting: Internal Medicine

## 2015-03-13 ENCOUNTER — Encounter: Payer: Self-pay | Admitting: Internal Medicine

## 2015-03-13 VITALS — BP 138/88 | HR 80 | Temp 98.0°F | Resp 20

## 2015-03-13 DIAGNOSIS — L03115 Cellulitis of right lower limb: Secondary | ICD-10-CM | POA: Diagnosis not present

## 2015-03-13 DIAGNOSIS — L0390 Cellulitis, unspecified: Secondary | ICD-10-CM | POA: Insufficient documentation

## 2015-03-13 MED ORDER — SILVER SULFADIAZINE 1 % EX CREA: TOPICAL_CREAM | CUTANEOUS | Status: DC

## 2015-03-13 MED ORDER — SULFAMETHOXAZOLE-TRIMETHOPRIM 800-160 MG PO TABS
ORAL_TABLET | ORAL | Status: DC
Start: 2015-03-13 — End: 2015-03-27

## 2015-03-13 NOTE — Progress Notes (Signed)
Patient ID: Ricardo [name], male   DOB: 11-01-1935, 80 y.o.   MRN: [id number]    Facility  [facility]    Place of Service:   OFFICE    Allergies  Allergen Reactions  . Peanut Oil Shortness Of Breath and Swelling    Itching  . Peanut-Containing Drug Products Shortness Of Breath and Swelling    Chief Complaint  Patient presents with  . Acute Visit    Patient c/o Infection between his right toe   . OTHER    Daughter in room with patient    HPI:  Patient presents with a little over a week history of discomfort and some redness of the right foot at the fourth and fifth toes. This area became so painful today was difficult to stand on it.  Medications: Patient's Medications  New Prescriptions   No medications on file  Previous Medications   ASPIRIN 81 MG CHEWABLE TABLET    Take one tablet by mouth once daily   ATORVASTATIN (LIPITOR) 10 MG TABLET    TAKE 1 TABLET (10 MG TOTAL) BY MOUTH AT BEDTIME.   CALCIUM 250 MG CAPS    Take 1 capsule by mouth daily.   CHOLECALCIFEROL ([employer]S D3) 5000 UNITS CAPSULE    Take 5,000 Units by mouth every morning.    CITALOPRAM (CELEXA) 20 MG TABLET    Take 1 tablet (20 mg total) by mouth daily.   LACTOBACILLUS (ACIDOPHILUS) 100 MG CAPS    Take 100 mg by mouth daily.   LOSARTAN (COZAAR) 25 MG TABLET    Take 1 tablet (25 mg total) by mouth daily.   MELATONIN 10 MG TABS    Take 1 one tablet by mouth at bedtime   MULTIPLE VITAMIN (MULTIVITAMIN WITH MINERALS) TABS TABLET    Take 1 tablet by mouth daily.   PANTOPRAZOLE (PROTONIX) 40 MG TABLET    Take one tablet by mouth once daily for stomach   SEREVENT DISKUS 50 MCG/DOSE DISKUS INHALER    TAKE 1 PUFF TWICE A DAY   TDAP (BOOSTRIX) 5-2.5-18.5 LF-MCG/0.5 INJECTION    Inject 0.5 mLs into the muscle once.   TRAZODONE (DESYREL) 50 MG TABLET    Take 1 tablet (50 mg total) by mouth at bedtime.   TRIMETHOPRIM (TRIMPEX) 100 MG TABLET    Take 1 tablet (100 mg total) by mouth every morning.  Modified Medications   No  medications on file  Discontinued Medications   No medications on file    Review of Systems  Constitutional: Negative for chills, activity change and fatigue.  HENT: Negative for sore throat and trouble swallowing.   Eyes: Negative for visual disturbance.  Respiratory: Positive for shortness of breath. Negative for cough and chest tightness.   Cardiovascular: Positive for leg swelling. Negative for chest pain and palpitations.  Gastrointestinal: Negative for nausea, vomiting, abdominal pain and blood in stool.  Genitourinary: Negative for urgency, frequency and difficulty urinating.       Chronic Foley catheter  Musculoskeletal: Positive for gait problem. Negative for arthralgias.  Skin: Negative for rash.       Cellulitis of left foot and ulceration on the fifth toe in the webspace between the fourth and fifth toes.  Neurological: Negative for weakness and headaches.  Psychiatric/Behavioral: Negative for confusion and sleep disturbance. The patient is not nervous/anxious.     Filed Vitals:   03/13/15 1412  BP: 138/88  Pulse: 80  Temp: 98 F (36.7 C)  TempSrc: Oral  Resp: 20  SpO2: 97%   There is no weight on file to calculate BMI. There were no vitals filed for this visit.   Physical Exam  Constitutional: He is oriented to person, place, and time. He appears well-developed and well-nourished.  Sitting in w/c in NAD  Cardiovascular: Normal rate, regular rhythm and intact distal pulses.  Exam reveals no decreased pulses.   Murmur (1/6 SEM) heard. Pulses:      Dorsalis pedis pulses are 2+ on the right side, and 2+ on the left side.       Posterior tibial pulses are 2+ on the right side, and 2+ on the left side.  Left calf swelling with palpable cord. Varicose veins present left calf. NT. Neg Homan's sign. Right calf soft, NT and no swelling right ankle FROM. Medial left ankle swelling with TTP behind medial malleolus  Pulmonary/Chest: Effort normal and breath sounds normal.  No respiratory distress. He has no wheezes. He has no rales. He exhibits no tenderness.  Genitourinary:  Foley cath intact and DTG clear urine  Musculoskeletal: He exhibits edema and tenderness.  Decreased ROM left ankle with foot deformity  Neurological: He is alert and oriented to person, place, and time.  Skin: Skin is warm and dry. No rash noted.  Cellulitis of the left foot with erythema at the fourth and fifth toes and swelling. There is an ulceration between the fourth and fifth toes at the base of the fifth toe medially. This area was extremely tender.  Psychiatric: He has a normal mood and affect. His behavior is normal. Thought content normal.    Labs reviewed: Lab Summary Latest Ref Rng 10/04/2014 06/22/2014 05/14/2014 05/11/2014  Hemoglobin 12.6 - 17.7 g/dL 14.4 (None) 12.4(L) (None)  Hematocrit 37.5 - 51.0 % 42.1 (None) 36.8(L) (None)  White count 3.4 - 10.8 x10E3/uL 8.4 (None) 6.0 (None)  Platelet count 150 - 379 x10E3/uL 268 (None) 250 (None)  Sodium 134 - 144 mmol/L 135 (None) 132(L) 132(L)  Potassium 3.5 - 5.2 mmol/L 4.3 (None) 4.5 4.8  Calcium 8.6 - 10.2 mg/dL 9.2 (None) 9.0 9.5  Phosphorus - (None) (None) (None) (None)  Creatinine 0.76 - 1.27 mg/dL 1.03 (None) 1.36(H) 0.98  AST 0 - 40 IU/L 15 (None) (None) 15  Alk Phos 39 - 117 IU/L 81 (None) (None) 102  Bilirubin 0.0 - 1.2 mg/dL 0.5 (None) (None) 0.2  Glucose 65 - 99 mg/dL 109(H) (None) 123(H) 111(H)  Cholesterol - (None) (None) (None) (None)  HDL cholesterol >39 mg/dL 65 73 (None) (None)  Triglycerides 0 - 149 mg/dL 96 122 (None) (None)  LDL Direct - (None) (None) (None) (None)  LDL Calc 0 - 99 mg/dL 77 54 (None) (None)  Total protein - (None) (None) (None) (None)  Albumin 3.5 - 4.8 g/dL 4.4 (None) (None) 4.6   No results found for: TSH, T3TOTAL, T4TOTAL, THYROIDAB Lab Results  Component Value Date   BUN 13 10/04/2014   BUN 17 05/14/2014   BUN 16 05/11/2014   Lab Results  Component Value Date   HGBA1C 6.0*  04/22/2014    Assessment/Plan  1. Cellulitis of right lower extremity Token Epsom salts daily - silver sulfADIAZINE (SILVADENE) 1 % cream; Cleanse wound daily then apply cream to the wound and cover with gauze  Dispense: 50 g; Refill: 3 - sulfamethoxazole-trimethoprim (BACTRIM DS,SEPTRA DS) 800-160 MG tablet; One twice daily to treat infection  Dispense: 20 tablet; Refill: 0 - Aerobic Culture

## 2015-03-14 ENCOUNTER — Encounter (HOSPITAL_COMMUNITY): Payer: Self-pay | Admitting: *Deleted

## 2015-03-14 ENCOUNTER — Observation Stay (HOSPITAL_COMMUNITY): Payer: Medicare HMO

## 2015-03-14 ENCOUNTER — Inpatient Hospital Stay (HOSPITAL_COMMUNITY)
Admission: EM | Admit: 2015-03-14 | Discharge: 2015-03-27 | DRG: 698 | Disposition: A | Discharge status: Home / Self Care | Payer: Medicare HMO | Attending: Internal Medicine | Admitting: Internal Medicine

## 2015-03-14 ENCOUNTER — Emergency Department (HOSPITAL_COMMUNITY): Payer: Medicare HMO

## 2015-03-14 DIAGNOSIS — Y846 Urinary catheterization as the cause of abnormal reaction of the patient, or of later complication, without mention of misadventure at the time of the procedure: Secondary | ICD-10-CM | POA: Diagnosis present

## 2015-03-14 DIAGNOSIS — I10 Essential (primary) hypertension: Secondary | ICD-10-CM | POA: Diagnosis present

## 2015-03-14 DIAGNOSIS — F39 Unspecified mood [affective] disorder: Secondary | ICD-10-CM | POA: Diagnosis present

## 2015-03-14 DIAGNOSIS — Z66 Do not resuscitate: Secondary | ICD-10-CM | POA: Diagnosis present

## 2015-03-14 DIAGNOSIS — L03115 Cellulitis of right lower limb: Secondary | ICD-10-CM | POA: Diagnosis present

## 2015-03-14 DIAGNOSIS — L309 Dermatitis, unspecified: Secondary | ICD-10-CM | POA: Diagnosis present

## 2015-03-14 DIAGNOSIS — T83511A Infection and inflammatory reaction due to indwelling urethral catheter, initial encounter: Principal | ICD-10-CM | POA: Diagnosis present

## 2015-03-14 DIAGNOSIS — R4182 Altered mental status, unspecified: Secondary | ICD-10-CM | POA: Diagnosis not present

## 2015-03-14 DIAGNOSIS — E871 Hypo-osmolality and hyponatremia: Secondary | ICD-10-CM | POA: Diagnosis not present

## 2015-03-14 DIAGNOSIS — Z87891 Personal history of nicotine dependence: Secondary | ICD-10-CM

## 2015-03-14 DIAGNOSIS — Z823 Family history of stroke: Secondary | ICD-10-CM

## 2015-03-14 DIAGNOSIS — H540 Blindness, both eyes: Secondary | ICD-10-CM | POA: Diagnosis present

## 2015-03-14 DIAGNOSIS — R269 Unspecified abnormalities of gait and mobility: Secondary | ICD-10-CM

## 2015-03-14 DIAGNOSIS — Z8661 Personal history of infections of the central nervous system: Secondary | ICD-10-CM

## 2015-03-14 DIAGNOSIS — Z8249 Family history of ischemic heart disease and other diseases of the circulatory system: Secondary | ICD-10-CM

## 2015-03-14 DIAGNOSIS — Z7982 Long term (current) use of aspirin: Secondary | ICD-10-CM

## 2015-03-14 DIAGNOSIS — N390 Urinary tract infection, site not specified: Secondary | ICD-10-CM | POA: Diagnosis not present

## 2015-03-14 DIAGNOSIS — H9190 Unspecified hearing loss, unspecified ear: Secondary | ICD-10-CM | POA: Diagnosis present

## 2015-03-14 DIAGNOSIS — E860 Dehydration: Secondary | ICD-10-CM | POA: Diagnosis present

## 2015-03-14 DIAGNOSIS — G9340 Encephalopathy, unspecified: Secondary | ICD-10-CM | POA: Diagnosis not present

## 2015-03-14 DIAGNOSIS — L0390 Cellulitis, unspecified: Secondary | ICD-10-CM | POA: Diagnosis present

## 2015-03-14 DIAGNOSIS — N319 Neuromuscular dysfunction of bladder, unspecified: Secondary | ICD-10-CM | POA: Diagnosis present

## 2015-03-14 DIAGNOSIS — J449 Chronic obstructive pulmonary disease, unspecified: Secondary | ICD-10-CM | POA: Diagnosis present

## 2015-03-14 DIAGNOSIS — D649 Anemia, unspecified: Secondary | ICD-10-CM

## 2015-03-14 DIAGNOSIS — D501 Sideropenic dysphagia: Secondary | ICD-10-CM | POA: Diagnosis not present

## 2015-03-14 DIAGNOSIS — G4700 Insomnia, unspecified: Secondary | ICD-10-CM | POA: Diagnosis present

## 2015-03-14 DIAGNOSIS — Z794 Long term (current) use of insulin: Secondary | ICD-10-CM

## 2015-03-14 DIAGNOSIS — B964 Proteus (mirabilis) (morganii) as the cause of diseases classified elsewhere: Secondary | ICD-10-CM | POA: Diagnosis present

## 2015-03-14 DIAGNOSIS — J438 Other emphysema: Secondary | ICD-10-CM | POA: Diagnosis not present

## 2015-03-14 DIAGNOSIS — H543 Unqualified visual loss, both eyes: Secondary | ICD-10-CM | POA: Diagnosis present

## 2015-03-14 DIAGNOSIS — R262 Difficulty in walking, not elsewhere classified: Secondary | ICD-10-CM | POA: Diagnosis present

## 2015-03-14 DIAGNOSIS — R509 Fever, unspecified: Secondary | ICD-10-CM | POA: Diagnosis not present

## 2015-03-14 DIAGNOSIS — R531 Weakness: Secondary | ICD-10-CM | POA: Diagnosis not present

## 2015-03-14 DIAGNOSIS — N400 Enlarged prostate without lower urinary tract symptoms: Secondary | ICD-10-CM | POA: Diagnosis present

## 2015-03-14 DIAGNOSIS — E785 Hyperlipidemia, unspecified: Secondary | ICD-10-CM | POA: Diagnosis present

## 2015-03-14 DIAGNOSIS — M79671 Pain in right foot: Secondary | ICD-10-CM | POA: Diagnosis not present

## 2015-03-14 DIAGNOSIS — E222 Syndrome of inappropriate secretion of antidiuretic hormone: Secondary | ICD-10-CM | POA: Diagnosis present

## 2015-03-14 DIAGNOSIS — N179 Acute kidney failure, unspecified: Secondary | ICD-10-CM | POA: Diagnosis present

## 2015-03-14 DIAGNOSIS — R9431 Abnormal electrocardiogram [ECG] [EKG]: Secondary | ICD-10-CM | POA: Diagnosis present

## 2015-03-14 DIAGNOSIS — Z85828 Personal history of other malignant neoplasm of skin: Secondary | ICD-10-CM

## 2015-03-14 DIAGNOSIS — R404 Transient alteration of awareness: Secondary | ICD-10-CM | POA: Diagnosis not present

## 2015-03-14 LAB — URINE MICROSCOPIC-ADD ON: SQUAMOUS EPITHELIAL / LPF: NONE SEEN

## 2015-03-14 LAB — URINALYSIS, ROUTINE W REFLEX MICROSCOPIC
Bilirubin Urine: NEGATIVE
GLUCOSE, UA: NEGATIVE mg/dL
Ketones, ur: NEGATIVE mg/dL
NITRITE: POSITIVE — AB
PH: 8.5 — AB (ref 5.0–8.0)
Protein, ur: 100 mg/dL — AB
Specific Gravity, Urine: 1.015 (ref 1.005–1.030)

## 2015-03-14 LAB — I-STAT CG4 LACTIC ACID, ED: Lactic Acid, Venous: 1.52 mmol/L (ref 0.5–2.0)

## 2015-03-14 LAB — CBC WITH DIFFERENTIAL/PLATELET
Basophils Absolute: 0 10*3/uL (ref 0.0–0.1)
Basophils Relative: 0 %
Eosinophils Absolute: 0.1 10*3/uL (ref 0.0–0.7)
Eosinophils Relative: 1 %
HEMATOCRIT: 31.1 % — AB (ref 39.0–52.0)
HEMOGLOBIN: 10.9 g/dL — AB (ref 13.0–17.0)
LYMPHS ABS: 0.7 10*3/uL (ref 0.7–4.0)
LYMPHS PCT: 7 %
MCH: 30.2 pg (ref 26.0–34.0)
MCHC: 35 g/dL (ref 30.0–36.0)
MCV: 86.1 fL (ref 78.0–100.0)
MONOS PCT: 9 %
Monocytes Absolute: 0.8 10*3/uL (ref 0.1–1.0)
NEUTROS ABS: 7.6 10*3/uL (ref 1.7–7.7)
NEUTROS PCT: 83 %
Platelets: 202 10*3/uL (ref 150–400)
RBC: 3.61 MIL/uL — ABNORMAL LOW (ref 4.22–5.81)
RDW: 13.4 % (ref 11.5–15.5)
WBC: 9.2 10*3/uL (ref 4.0–10.5)

## 2015-03-14 LAB — COMPREHENSIVE METABOLIC PANEL
ALT: 16 U/L — AB (ref 17–63)
ANION GAP: 11 (ref 5–15)
AST: 23 U/L (ref 15–41)
Albumin: 3.7 g/dL (ref 3.5–5.0)
Alkaline Phosphatase: 68 U/L (ref 38–126)
BUN: 15 mg/dL (ref 6–20)
CHLORIDE: 94 mmol/L — AB (ref 101–111)
CO2: 20 mmol/L — AB (ref 22–32)
CREATININE: 1.21 mg/dL (ref 0.61–1.24)
Calcium: 8.3 mg/dL — ABNORMAL LOW (ref 8.9–10.3)
GFR calc non Af Amer: 56 mL/min — ABNORMAL LOW (ref >60)
Glucose, Bld: 123 mg/dL — ABNORMAL HIGH (ref 65–99)
POTASSIUM: 4.2 mmol/L (ref 3.5–5.1)
SODIUM: 125 mmol/L — AB (ref 135–145)
Total Bilirubin: 0.8 mg/dL (ref 0.3–1.2)
Total Protein: 6.4 g/dL — ABNORMAL LOW (ref 6.5–8.1)

## 2015-03-14 LAB — SEDIMENTATION RATE: SED RATE: 20 mm/h — AB (ref 0–16)

## 2015-03-14 LAB — I-STAT TROPONIN, ED: Troponin i, poc: 0 ng/mL (ref 0.00–0.08)

## 2015-03-14 LAB — C-REACTIVE PROTEIN: CRP: 7.4 mg/dL — ABNORMAL HIGH (ref <1.0)

## 2015-03-14 LAB — TROPONIN I: Troponin I: <0.03 ng/mL (ref <0.031)

## 2015-03-14 MED ORDER — ENOXAPARIN SODIUM 30 MG/0.3ML ~~LOC~~ SOLN
30.0000 mg | SUBCUTANEOUS | Status: DC
  Administered 2015-03-14 – 2015-03-19 (×6): 30 mg via SUBCUTANEOUS
  Filled 2015-03-14 (×7): qty 0.3

## 2015-03-14 MED ORDER — ASPIRIN 81 MG PO CHEW
81.0000 mg | CHEWABLE_TABLET | Freq: Once | ORAL | Status: AC
  Administered 2015-03-14: 81 mg via ORAL
  Filled 2015-03-14: qty 1

## 2015-03-14 MED ORDER — TRIMETHOPRIM 100 MG PO TABS: 100.0000 mg | ORAL_TABLET | Freq: Every morning | ORAL | Status: DC

## 2015-03-14 MED ORDER — ALBUTEROL SULFATE (2.5 MG/3ML) 0.083% IN NEBU: 2.5000 mg | INHALATION_SOLUTION | RESPIRATORY_TRACT | Status: DC | PRN

## 2015-03-14 MED ORDER — HYDROCODONE-ACETAMINOPHEN 5-325 MG PO TABS
1.0000 | ORAL_TABLET | ORAL | Status: DC | PRN
  Administered 2015-03-18 – 2015-03-21 (×3): 1 via ORAL
  Filled 2015-03-14 (×3): qty 1

## 2015-03-14 MED ORDER — SODIUM CHLORIDE 0.9 % IV BOLUS (SEPSIS)
1000.0000 mL | Freq: Once | INTRAVENOUS | Status: AC
  Administered 2015-03-14: 1000 mL via INTRAVENOUS

## 2015-03-14 MED ORDER — ACIDOPHILUS 100 MG PO CAPS: 100.0000 mg | ORAL_CAPSULE | Freq: Every day | ORAL | Status: DC

## 2015-03-14 MED ORDER — ONDANSETRON HCL 4 MG PO TABS: 4.0000 mg | ORAL_TABLET | Freq: Four times a day (QID) | ORAL | Status: DC | PRN

## 2015-03-14 MED ORDER — MELATONIN 10 MG PO TABS: 10.0000 mg | ORAL_TABLET | Freq: Every day | ORAL | Status: DC

## 2015-03-14 MED ORDER — ACETAMINOPHEN 325 MG PO TABS
650.0000 mg | ORAL_TABLET | Freq: Four times a day (QID) | ORAL | Status: DC | PRN
  Administered 2015-03-15 – 2015-03-17 (×5): 650 mg via ORAL
  Filled 2015-03-14 (×5): qty 2

## 2015-03-14 MED ORDER — CEFTRIAXONE SODIUM 1 G IJ SOLR
1.0000 g | Freq: Once | INTRAMUSCULAR | Status: AC
  Administered 2015-03-14: 1 g via INTRAVENOUS
  Filled 2015-03-14: qty 10

## 2015-03-14 MED ORDER — SILVER SULFADIAZINE 1 % EX CREA
1.0000 "application " | TOPICAL_CREAM | Freq: Every day | CUTANEOUS | Status: DC
  Administered 2015-03-15 – 2015-03-27 (×13): 1 via TOPICAL
  Filled 2015-03-14: qty 85

## 2015-03-14 MED ORDER — ACETAMINOPHEN 650 MG RE SUPP: 650.0000 mg | Freq: Four times a day (QID) | RECTAL | Status: DC | PRN

## 2015-03-14 MED ORDER — LACTINEX PO CHEW
1.0000 | CHEWABLE_TABLET | Freq: Every day | ORAL | Status: DC
  Administered 2015-03-15 – 2015-03-24 (×10): 1 via ORAL
  Filled 2015-03-14 (×12): qty 1

## 2015-03-14 MED ORDER — LOSARTAN POTASSIUM 25 MG PO TABS
25.0000 mg | ORAL_TABLET | Freq: Every day | ORAL | Status: DC
  Administered 2015-03-15 – 2015-03-24 (×10): 25 mg via ORAL
  Filled 2015-03-14 (×11): qty 1

## 2015-03-14 MED ORDER — DEXTROSE 5 % IV SOLN
1.0000 g | INTRAVENOUS | Status: DC
  Filled 2015-03-14: qty 10

## 2015-03-14 MED ORDER — PANTOPRAZOLE SODIUM 40 MG PO TBEC
40.0000 mg | DELAYED_RELEASE_TABLET | Freq: Every day | ORAL | Status: DC
  Administered 2015-03-15 – 2015-03-27 (×13): 40 mg via ORAL
  Filled 2015-03-14 (×13): qty 1

## 2015-03-14 MED ORDER — CITALOPRAM HYDROBROMIDE 20 MG PO TABS
20.0000 mg | ORAL_TABLET | Freq: Every day | ORAL | Status: DC
  Administered 2015-03-14 – 2015-03-20 (×7): 20 mg via ORAL
  Filled 2015-03-14 (×8): qty 1

## 2015-03-14 MED ORDER — ATORVASTATIN CALCIUM 10 MG PO TABS
10.0000 mg | ORAL_TABLET | Freq: Every day | ORAL | Status: DC
  Administered 2015-03-15 – 2015-03-26 (×11): 10 mg via ORAL
  Filled 2015-03-14 (×14): qty 1

## 2015-03-14 MED ORDER — ONDANSETRON HCL 4 MG/2ML IJ SOLN: 4.0000 mg | Freq: Four times a day (QID) | INTRAMUSCULAR | Status: DC | PRN

## 2015-03-14 MED ORDER — SODIUM CHLORIDE 0.9 % IV SOLN
INTRAVENOUS | Status: AC
  Administered 2015-03-14: 22:00:00 via INTRAVENOUS

## 2015-03-14 MED ORDER — TRAZODONE HCL 50 MG PO TABS
50.0000 mg | ORAL_TABLET | Freq: Every day | ORAL | Status: DC
  Administered 2015-03-14 – 2015-03-20 (×7): 50 mg via ORAL
  Filled 2015-03-14 (×10): qty 1

## 2015-03-14 MED ORDER — DEXTROSE 5 % IV SOLN
1.0000 g | INTRAVENOUS | Status: DC
  Administered 2015-03-15 – 2015-03-19 (×5): 1 g via INTRAVENOUS
  Filled 2015-03-14 (×5): qty 10

## 2015-03-14 MED ORDER — SALMETEROL XINAFOATE 50 MCG/DOSE IN AEPB
1.0000 | INHALATION_SPRAY | Freq: Two times a day (BID) | RESPIRATORY_TRACT | Status: DC
  Administered 2015-03-15 – 2015-03-27 (×25): 1 via RESPIRATORY_TRACT
  Filled 2015-03-14: qty 0

## 2015-03-14 MED ORDER — SULFAMETHOXAZOLE-TRIMETHOPRIM 800-160 MG PO TABS
1.0000 | ORAL_TABLET | Freq: Two times a day (BID) | ORAL | Status: DC
  Administered 2015-03-14 – 2015-03-17 (×6): 1 via ORAL
  Filled 2015-03-14 (×7): qty 1

## 2015-03-14 NOTE — Progress Notes (Signed)
PHARMACIST - PHYSICIAN ORDER COMMUNICATION  CONCERNING: P&T Medication Policy on Herbal Medications  DESCRIPTION:  This patient's order for:  melatonin  has been noted.  This product(s) is classified as an "herbal" or natural product. Due to a lack of definitive safety studies or FDA approval, nonstandard manufacturing practices, plus the potential risk of unknown drug-drug interactions while on inpatient medications, the Pharmacy and Therapeutics Committee does not permit the use of "herbal" or natural products of this type within [facility].   ACTION TAKEN: The pharmacy department is unable to verify this order at this time and your patient has been informed of this safety policy. Please reevaluate patient's clinical condition at discharge and address if the herbal or natural product(s) should be resumed at that time.  Romeo Rabon, PharmD, pager [phone]. 03/14/2015,8:57 PM.

## 2015-03-14 NOTE — ED Provider Notes (Signed)
CSN: [id number]     Arrival date & time 03/14/15  1544 History   First MD Initiated Contact with Patient 03/14/15 [phone]     Chief Complaint  Patient presents with  . Fever   HPI   Ricardo [name] is an 80 y.o. 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 He is accompanied by his daughter who provides some of the history. Per his daughter, pt was seen by PCP for infection of his right fifth toe yesterday and diagnosed with cellulitis, started on Bactrim. She states that today pt has been confused at home (he lives with her). Pt reportedly has an aide who helps him go on walks and pt had impaired balance increased from baseline and unable to walk. Pt's daughter states that at baseline pt is very "with it" mentally but today was confused as to the day of the week and will occasionally say things in conversation that don't make much sense. Pt denies any pain. Denies chest pain or SOB. In the ED he is A&O x 3 but during our conversation will also say things that don't correlate to our conversation. Pt was apprently febrile to 101.5F with EMS and given 1000mg  Tylenol. Temp in the ED is 100.46F.   Past Medical History  Diagnosis Date  . Rotator cuff tear     right  . Hypertension   . High cholesterol   . Meningitis   . Enlarged prostate   . 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 Deaf     due to meningitis  . Loss of balance     due to meningitis  . Unable to walk     due to meningitis  . Eczema   . Cancer [facility]) 1956    left testicle  . Skin cancer   . Neurogenic bladder     History of   Past Surgical History  Procedure Laterality Date  . Appendectomy    . Prostate surgery      biopsy x 2  . Tonsillectomy      childhood  . Testicle removal Left     October 1956  . Cochlear implant    . Cystoscopy with litholapaxy N/A 05/14/2014    Procedure: CYSTOSCOPY WITH LITHOLAPAXY;  Surgeon: [name], MD;  Location: [facility];  Service: Urology;  Laterality: N/A;  . Insertion of suprapubic catheter N/A 05/14/2014    Procedure: INSERTION OF SUPRAPUBIC CATHETER;  Surgeon: [name], MD;  Location: [facility];  Service: Urology;  Laterality: N/A;   Family History  Problem Relation Age of Onset  . Heart failure Father   . Stroke Mother    Social History  Substance Use Topics  . Smoking status: Former Smoker -- 10 years    Types: Cigarettes    Start date: 02/10/1955    Quit date: 02/09/1965  . Smokeless tobacco: Never Used  . Alcohol Use: No    Review of Systems  All other systems reviewed and are negative.     Allergies  Peanut oil and Peanut-containing drug products  Home Medications   Prior to Admission medications   Medication Sig Start Date End Date Taking? Authorizing Provider  aspirin 81 MG chewable tablet Take one tablet by mouth once daily 02/21/14   [name], DO  atorvastatin (LIPITOR) 10 MG tablet TAKE 1 TABLET (10 MG TOTAL) BY MOUTH AT BEDTIME. 12/31/14  [name], DO  Calcium 250 MG CAPS Take 1 capsule by mouth daily.    Historical Provider, MD  Cholecalciferol ([employer] D3) 5000 UNITS capsule Take 5,000 Units by mouth every morning.     Historical Provider, MD  citalopram (CELEXA) 20 MG tablet Take 1 tablet (20 mg total) by mouth daily. 11/07/14   [name], DO  Lactobacillus (ACIDOPHILUS) 100 MG CAPS Take 100 mg by mouth daily.    Historical Provider, MD  losartan (COZAAR) 25 MG tablet Take 1 tablet (25 mg total) by mouth daily. 01/22/15   Lauree Chandler, NP  Melatonin 10 MG TABS Take 1 one tablet by mouth at bedtime    Historical Provider, MD  Multiple Vitamin (MULTIVITAMIN WITH MINERALS) TABS tablet Take 1 tablet by mouth daily.    Historical Provider, MD  pantoprazole (PROTONIX) 40 MG tablet Take one tablet by mouth once daily for stomach 10/01/14   [name], DO  SEREVENT DISKUS 50 MCG/DOSE diskus inhaler TAKE 1 PUFF TWICE A DAY  03/01/15   [name], DO  silver sulfADIAZINE (SILVADENE) 1 % cream Cleanse wound daily then apply cream to the wound and cover with gauze 03/13/15   Estill Dooms, MD  sulfamethoxazole-trimethoprim (BACTRIM DS,SEPTRA DS) 800-160 MG tablet One twice daily to treat infection 03/13/15   Estill Dooms, MD  Tdap (BOOSTRIX) 5-2.5-18.5 LF-MCG/0.5 injection Inject 0.5 mLs into the muscle once. Patient not taking: Reported on 03/13/2015 11/07/14   [name], DO  traZODone (DESYREL) 50 MG tablet Take 1 tablet (50 mg total) by mouth at bedtime. Patient taking differently: Take 25 mg by mouth at bedtime.  11/07/14   [name], DO  trimethoprim (TRIMPEX) 100 MG tablet Take 1 tablet (100 mg total) by mouth every morning. 10/29/14   [name], DO   BP 110/55 mmHg  Pulse 90  Temp(Src) 100.1 F (37.8 C) (Oral)  Resp 16  SpO2 94% Physical Exam  Constitutional: He is oriented to person, place, and time. No distress.  HENT:  Right Ear: External ear normal.  Left Ear: External ear normal.  Nose: Nose normal.  Mouth/Throat: Oropharynx is clear and moist. No oropharyngeal exudate.  Eyes: Conjunctivae are normal.  Pt laying in bed and conversing with eyes closed (this is not baseline). Will open eyes with prompting. Right eye is blind, left eye is nearly blind.  Neck: Normal range of motion. Neck supple.  Cardiovascular: Normal rate, regular rhythm, normal heart sounds and intact distal pulses.   Pulmonary/Chest: Effort normal and breath sounds normal. No respiratory distress. He has no wheezes. He has no rales. He exhibits no tenderness.  Abdominal: Soft. Bowel sounds are normal. He exhibits no distension. There is no tenderness. There is no rebound and no guarding.  Suprapubic cath in place. No erythema or drainage.  Musculoskeletal: He exhibits no edema.  Lymphadenopathy:    He has no cervical adenopathy.  Neurological: He is alert and oriented to person, place, and time. No cranial nerve  deficit.  Intact strength bilateral UE and LE. Good grip strength. No pronator drift.   Skin: Skin is warm and dry. He is not diaphoretic.  Skin between right fourth and fifth toes is erythematous, cellulitic. Some erythema extending to dorsum of foot. Good cap refill. No tenderness or fluctuance.   Psychiatric: He has a normal mood and affect.  Nursing note and vitals reviewed.   ED Course  Procedures (including critical care time) Labs Review Labs Reviewed  COMPREHENSIVE METABOLIC PANEL -  Abnormal; Notable for the following:    Sodium 125 (*)    Chloride 94 (*)    CO2 20 (*)    Glucose, Bld 123 (*)    Calcium 8.3 (*)    Total Protein 6.4 (*)    ALT 16 (*)    GFR calc non Af Amer 56 (*)    All other components within normal limits  CBC WITH DIFFERENTIAL/PLATELET - Abnormal; Notable for the following:    RBC 3.61 (*)    Hemoglobin 10.9 (*)    HCT 31.1 (*)    All other components within normal limits  URINALYSIS, ROUTINE W REFLEX MICROSCOPIC (NOT AT [facility]) - Abnormal; Notable for the following:    APPearance TURBID (*)    pH 8.5 (*)    Hgb urine dipstick SMALL (*)    Protein, ur 100 (*)    Nitrite POSITIVE (*)    Leukocytes, UA LARGE (*)    All other components within normal limits  URINE MICROSCOPIC-ADD ON - Abnormal; Notable for the following:    Bacteria, UA MANY (*)    Crystals TRIPLE PHOSPHATE CRYSTALS (*)    All other components within normal limits  URINE CULTURE  CULTURE, BLOOD (ROUTINE X 2)  CULTURE, BLOOD (ROUTINE X 2)  I-STAT TROPOININ, ED  I-STAT CG4 LACTIC ACID, ED    Imaging Review Dg Chest 2 View  03/14/2015  CLINICAL DATA:  Fever.  Altered mental status. EXAM: CHEST  2 VIEW COMPARISON:  04/21/2014 FINDINGS: Mild-to-moderate enlargement of the cardiopericardial silhouette with evidence of coronary artery atherosclerosis. The patient is rotated to the left on today's radiograph, reducing diagnostic sensitivity and specificity. Thoracic spondylosis. No  blunting of the costophrenic angles identified. Eventration of the left posterior hemidiaphragm. IMPRESSION: 1. Mild to moderate enlargement of the cardiopericardial silhouette, without edema. No pleural effusion. 2. Thoracic spondylosis. Electronically Signed   By: Van Clines M.D.   On: 03/14/2015 17:12   Dg Foot Complete Right  03/14/2015  CLINICAL DATA:  Fever today with pain around the fourth and fifth toes of right foot. EXAM: RIGHT FOOT COMPLETE - 3+ VIEW COMPARISON:  None. FINDINGS: There is no evidence of fracture or dislocation. There is diffuse osteopenia. Degenerative joint changes of the right mid to the distal foot are noted. The soft tissues are normal. IMPRESSION: No acute abnormality identified. Osteoarthritic changes of right foot. Electronically Signed   By: Abelardo Diesel M.D.   On: 03/14/2015 17:12   I have personally reviewed and evaluated these images and lab results as part of my medical decision-making.   EKG Interpretation   Date/Time:  Thursday March 14 2015 17:22:02 EST Ventricular Rate:  82 PR Interval:  249 QRS Duration: 113 QT Interval:  390 QTC Calculation: 455 R Axis:   54 Text Interpretation:  Sinus rhythm Prolonged PR interval Lateral infarct,  recent Borderline ST elevation, inferior leads ED PHYSICIAN INTERPRETATION  AVAILABLE IN [facility] Confirmed by TEST, Record ([id number]) on  03/15/2015 7:13:24 AM      MDM   Final diagnoses:  Urinary tract infection associated with catheterization of urinary tract, initial encounter  Altered mental status, unspecified altered mental status type  Hyponatremia    Will initiate sepsis workup though at this time pt does not meet sepsis criteria (technically afebrile, no tachycardia, systolic BP [id number]). Will start IV fluid resuscitation, pan culture, CBC, CMP, trop, lactic acid. Will obtain CXR and XR of right foot.    Remarkable pt does not have a white  count. CXR and foot XR negative. Hyponatremic to  125. UA shows nitrite positive infection. Given AMS, hyponatremia, and UTI I believe pt warrants admission as I suspect he does not have simple catheter-associated bacturia. I spoke to Dr. Tamala Julian of hospitalist team who will admit pt to obs. Pt started on IV rocephin in the ED.    Ricardo Ng, PA-C 03/15/15 1604  Leonard Schwartz, MD 03/16/15 [phone]

## 2015-03-14 NOTE — ED Notes (Signed)
Bed: WA06 Expected date:  Expected time:  Means of arrival:  Comments: EMS- 80yo M, infected toe/fever

## 2015-03-14 NOTE — ED Notes (Signed)
Spoke to CT advised will come take him to have scan prior to going to floor.

## 2015-03-14 NOTE — ED Notes (Signed)
EMS called to home.  Family called EMS for fever and issue with an infected toe.   EMS got temp of 101.6 and gave 1000mg  of tylenol.  Patient states that he started having  Pain in his right foot in the pinky and 4th toe.

## 2015-03-14 NOTE — H&P (Signed)
[facility] History and Physical  Ricardo [name] [id number] DOB: [birth year] DOA: 03/14/2015  Referring physician: ED PCP: [name], DO   Chief Complaint: Confusion  HPI:  [name] is a 80 year old male with a past medical history bacterial meningitis with residual blindness, hearing loss, and neurogenic bladder s/p suprapubic catheter; who presents to the emergency department with confusion. The patient lives with his daughter and she is present and she helps provide additional history. She had noticed that he was somewhat confused and was having increased issues with his balance to the point in which he was unable to walk. She notes that the difficulty in walking had actually started a few days ago, but was not as severe as today. He normally is very with it mentally, but notes that he forgot to take a bath after being told to and saying things that didn't make much sense. He is right-handed and denies any weakness, shortness of breath, or chest pain. The daughter states that they had just gone to the doctor yesterday for an infection of his right fifth toe. They had taken cultures there and had started him on Bactrim for which she only taken 2 doses. She also notes that he has been going back almost weekly to Dr. [name] for issues with his suprapubic catheter because of sediment causing the catheter to the point which is not able to be flushed. They last went to his office  last Friday he reports that the catheter was changed at that time.  In route with EMS patient was noted to have a temperature 101.62F and he was given 1000 mg of Tylenol. Upon admission patient was evaluated with urinalysis which was positive for large leukocyte, nitrites, many bacteria, and no epithelial cells. WBC was 9.2, hemoglobin 10.9, sodium 125, chloride 94, bicarbonate 20.   Review of Systems  Constitutional: Positive for fever and malaise/fatigue. Negative for chills.  HENT: Positive for hearing  loss.   Eyes: Negative for pain.       Legally blind  Respiratory: Negative for hemoptysis.   Cardiovascular: Negative for chest pain and palpitations.  Gastrointestinal: Negative for nausea and vomiting.  Musculoskeletal: Positive for joint pain. Negative for falls.  Skin: Negative for itching.       Erythema and swelling of right fifth digit  Neurological: Negative for seizures and loss of consciousness.  Endo/Heme/Allergies: Negative for environmental allergies and polydipsia.  Psychiatric/Behavioral: Negative for suicidal ideas and substance abuse.       Confusion        Past Medical History  Diagnosis Date  . Rotator cuff tear     right  . Hypertension   . High cholesterol   . Meningitis   . Enlarged prostate   . 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 Deaf     due to meningitis  . Loss of balance     due to meningitis  . Unable to walk     due to meningitis  . Eczema   . Cancer [facility]) 1956    left testicle  . Skin cancer   . Neurogenic bladder     History of     Past Surgical History  Procedure Laterality Date  . Appendectomy    . Prostate surgery      biopsy x 2  . Tonsillectomy      childhood  . Testicle removal Left     October 1956  . Cochlear implant    . Cystoscopy with  litholapaxy N/A 05/14/2014    Procedure: CYSTOSCOPY WITH LITHOLAPAXY;  Surgeon: [name], MD;  Location: [facility];  Service: Urology;  Laterality: N/A;  . Insertion of suprapubic catheter N/A 05/14/2014    Procedure: INSERTION OF SUPRAPUBIC CATHETER;  Surgeon: [name], MD;  Location: [facility];  Service: Urology;  Laterality: N/A;      Social History:  reports that he quit smoking about 50 years ago. His smoking use included Cigarettes. He started smoking about 60 years ago. He quit after 10 years of use. He has never used smokeless tobacco. He reports that he does not drink alcohol or use illicit drugs. Where does patient live--home   and with whom if at home?  Can  patient participate in ADLs? Need assistance  Allergies  Allergen Reactions  . Peanut Oil Shortness Of Breath and Swelling    Itching  . Peanut-Containing Drug Products Shortness Of Breath and Swelling    Family History  Problem Relation Age of Onset  . Heart failure Father   . Stroke Mother       Prior to Admission medications   Medication Sig Start Date End Date Taking? Authorizing Provider  aspirin 81 MG chewable tablet Take one tablet by mouth once daily 02/21/14  Yes [name], DO  atorvastatin (LIPITOR) 10 MG tablet TAKE 1 TABLET (10 MG TOTAL) BY MOUTH AT BEDTIME. 12/31/14  Yes [name], DO  citalopram (CELEXA) 20 MG tablet Take 1 tablet (20 mg total) by mouth daily. 11/07/14  Yes Monica Carter, DO  Cranberry (ELLURA PO) Take 2 tablets by mouth at bedtime.   Yes Historical Provider, MD  Lactobacillus (ACIDOPHILUS) 100 MG CAPS Take 100 mg by mouth daily.   Yes Historical Provider, MD  losartan (COZAAR) 25 MG tablet Take 1 tablet (25 mg total) by mouth daily. 01/22/15  Yes Lauree Chandler, NP  Melatonin 10 MG TABS Take 10 mg by mouth at bedtime.    Yes Historical Provider, MD  pantoprazole (PROTONIX) 40 MG tablet Take one tablet by mouth once daily for stomach 10/01/14  Yes [name], DO  SEREVENT DISKUS 50 MCG/DOSE diskus inhaler TAKE 1 PUFF TWICE A DAY 03/01/15  Yes [name], DO  silver sulfADIAZINE (SILVADENE) 1 % cream Cleanse wound daily then apply cream to the wound and cover with gauze Patient taking differently: Apply 1 application topically daily. Cleanse wound daily then apply cream to the wound and cover with gauze 03/13/15  Yes Estill Dooms, MD  sulfamethoxazole-trimethoprim (BACTRIM DS,SEPTRA DS) 800-160 MG tablet One twice daily to treat infection Patient taking differently: Take 1 tablet by mouth 2 (two) times daily.  03/13/15  Yes Estill Dooms, MD  traZODone (DESYREL) 50 MG tablet Take 1 tablet (50 mg total) by mouth at bedtime. 11/07/14  Yes [name], DO  trimethoprim (TRIMPEX) 100 MG tablet Take 1 tablet (100 mg total) by mouth every morning. 10/29/14  Yes [name], DO  Tdap (BOOSTRIX) 5-2.5-18.5 LF-MCG/0.5 injection Inject 0.5 mLs into the muscle once. Patient not taking: Reported on 03/13/2015 11/07/14   [name], DO     Physical Exam: Filed Vitals:   03/14/15 1603 03/14/15 1730 03/14/15 1830  BP: 110/55 142/81 181/86  Pulse: 90 83 78  Temp: 100.1 F (37.8 C)    TempSrc: Oral    Resp: 16 22 20   SpO2: 94% 95% 98%     Constitutional: Vital signs reviewed. Patient is a well-developed and well-nourished in no acute distress and cooperative  with exam. Alert and oriented x3.  Head: Normocephalic and atraumatic  Ear: TM normal bilaterally  Mouth: no erythema or exudates, MMM  Eyes: conjunctivae normal, No scleral icterus,  legally blind in right eye and near legally blind on left eye Neck: Supple, Trachea midline normal ROM, No JVD, mass, thyromegaly, or carotid bruit present.  Cardiovascular: RRR, S1 normal, S2 normal, no MRG, pulses symmetric and intact bilaterally  Pulmonary/Chest: CTAB, no wheezes, rales, or rhonchi  Abdominal: Soft. Non-tender, non-distended, bowel sounds are normal, no masses, organomegaly, or guarding present.  GU: no CVA tenderness, urine sediment present in urinary catheter, and suprapubic catheter in place with no signs of infection or drainage present  Musculoskeletal: No joint deformities, erythema, or stiffness, ROM full and no nontender Ext: no edema and no cyanosis, pulses palpable bilaterally (DP and PT)  Hematology: no cervical, inginal, or axillary adenopathy.  Neurological: A&O x3, Strenght is normal and symmetric bilaterally in the upper and lower extremities, no facial droop or slurred speech appreciated at this time.  Skin: Skin along the fifth and fourth digit on the right foot is erythematous with swelling (marked on patient), no ulcer appreciated   Psychiatric: Normal mood and  affect. speech and behavior is normal. Judgment and thought content normal. Cognition and memory are normal.      Data Review   Micro Results No results found for this or any previous visit (from the past 240 hour(s)).  Radiology Reports Dg Chest 2 View  03/14/2015  CLINICAL DATA:  Fever.  Altered mental status. EXAM: CHEST  2 VIEW COMPARISON:  04/21/2014 FINDINGS: Mild-to-moderate enlargement of the cardiopericardial silhouette with evidence of coronary artery atherosclerosis. The patient is rotated to the left on today's radiograph, reducing diagnostic sensitivity and specificity. Thoracic spondylosis. No blunting of the costophrenic angles identified. Eventration of the left posterior hemidiaphragm. IMPRESSION: 1. Mild to moderate enlargement of the cardiopericardial silhouette, without edema. No pleural effusion. 2. Thoracic spondylosis. Electronically Signed   By: Van Clines M.D.   On: 03/14/2015 17:12   Dg Foot Complete Right  03/14/2015  CLINICAL DATA:  Fever today with pain around the fourth and fifth toes of right foot. EXAM: RIGHT FOOT COMPLETE - 3+ VIEW COMPARISON:  None. FINDINGS: There is no evidence of fracture or dislocation. There is diffuse osteopenia. Degenerative joint changes of the right mid to the distal foot are noted. The soft tissues are normal. IMPRESSION: No acute abnormality identified. Osteoarthritic changes of right foot. Electronically Signed   By: Abelardo Diesel M.D.   On: 03/14/2015 17:12     CBC  Recent Labs Lab 03/14/15 1632  WBC 9.2  HGB 10.9*  HCT 31.1*  PLT 202  MCV 86.1  MCH 30.2  MCHC 35.0  RDW 13.4  LYMPHSABS 0.7  MONOABS 0.8  EOSABS 0.1  BASOSABS 0.0    Chemistries   Recent Labs Lab 03/14/15 1632  NA 125*  K 4.2  CL 94*  CO2 20*  GLUCOSE 123*  BUN 15  CREATININE 1.21  CALCIUM 8.3*  AST 23  ALT 16*  ALKPHOS 68  BILITOT 0.8    ------------------------------------------------------------------------------------------------------------------ CrCl cannot be calculated (Unknown ideal weight.). ------------------------------------------------------------------------------------------------------------------ No results for input(s): HGBA1C in the last 72 hours. ------------------------------------------------------------------------------------------------------------------ No results for input(s): CHOL, HDL, LDLCALC, TRIG, CHOLHDL, LDLDIRECT in the last 72 hours. ------------------------------------------------------------------------------------------------------------------ No results for input(s): TSH, T4TOTAL, T3FREE, THYROIDAB in the last 72 hours.  Invalid input(s): FREET3 ------------------------------------------------------------------------------------------------------------------ No results for input(s): VITAMINB12, FOLATE, FERRITIN, TIBC, IRON, RETICCTPCT  in the last 72 hours.  Coagulation profile No results for input(s): INR, PROTIME in the last 168 hours.  No results for input(s): DDIMER in the last 72 hours.  Cardiac Enzymes No results for input(s): CKMB, TROPONINI, MYOGLOBIN in the last 168 hours.  Invalid input(s): CK ------------------------------------------------------------------------------------------------------------------ Invalid input(s): POCBNP   CBG: No results for input(s): GLUCAP in the last 168 hours.     EKG: Independently reviewed. Sinus rhythm with signs of recent lateral infarct  Assessment/Plan Acute encephalopathy: Improving. Patient presents with symptoms of confusion that appeared to have slowly improve since he has been admitted to the emergency department. It appears patient had been being treated with Bactrim for cellulitis, positive for a UTI on urinalysis, and found to be  hyponatremic with Na of 125. Question cause of acute altered mental status. Cannot rule  out the possibility of acute infarct as patient was noted to have balance issues that were progressively worse starting 2 days ago. - Admit to telemetry bed - Check a CT of the brain - Monitor overnight checking neuro status - check blood cultures.  Suspected acute UTI (urinary tract infection): Patient with history of a suprapubic catheter for which it likely expected for the patient have bacteremia. UA is significant for a urinary tract infection. Due to patient's acute altered mental status will treat. - Rocephin per pharmacy  - Follow-up urine culture  - May want to contact patient's urologist in a.m. regarding increased urine sediment as well  Cellulitis of the right foot: Acute. Patient had only taken 2 doses of the Bactrim prior to coming in the emergency department. The area of erythema and swelling demarcated on patient's foot. - Continue Bactrim for now for coverage of staph - Reassess in a.m. and change antibiotics if needed  Abnormal EKG: Initial EKG shows signs of a possible lateral infarct that appears to be recent. - Follow telemetry overnight - Cardiac troponins - Reassess in a.m. for need of further workup  Hyponatremia: Acute. Question this could be caused by citalopram versus dehydration - Gentle IV fluid hydration overnight avoid rapid overcorrection - Recheck sodium level in a.m.  Anemia: Acute. Hemoglobins appears to have been normal back in 10/2014. On admission today hemoglobin 10.9 - Recheck CBC in a.m.  Gait abnormality - Physical therapy to eval and treat   Code Status:   full Family Communication: bedside Disposition Plan: admit   Total time spent 55 minutes.Greater than 50% of this time was spent in counseling, explanation of diagnosis, planning of further management, and coordination of care  [facility] Pager [phone]  If 7PM-7AM, please contact night-coverage [email or web address] Password [facility] 03/14/2015, 6:57 PM

## 2015-03-14 NOTE — Progress Notes (Signed)
ANTIBIOTIC CONSULT NOTE - INITIAL  Pharmacy Consult for ceftriaxone Indication: UTI  Allergies  Allergen Reactions  . Peanut Oil Shortness Of Breath and Swelling    Itching  . Peanut-Containing Drug Products Shortness Of Breath and Swelling    Vital Signs: Temp: 98 F (36.7 C) (02/09 1947) Temp Source: Oral (02/09 1947) BP: 158/66 mmHg (02/09 1930) Pulse Rate: 82 (02/09 1930) Intake/Output from previous day:   Intake/Output from this shift: Total I/O In: -  Out: 1900 [Urine:1900]  Labs:  Recent Labs  03/14/15 1632  WBC 9.2  HGB 10.9*  PLT 202  CREATININE 1.21   CrCl cannot be calculated (Unknown ideal weight.). No results for input(s): VANCOTROUGH, VANCOPEAK, VANCORANDOM, GENTTROUGH, GENTPEAK, GENTRANDOM, TOBRATROUGH, TOBRAPEAK, TOBRARND, AMIKACINPEAK, AMIKACINTROU, AMIKACIN in the last 72 hours.   Microbiology: No results found for this or any previous visit (from the past 720 hour(s)).  Medical History: Past Medical History  Diagnosis Date  . Rotator cuff tear     right  . Hypertension   . High cholesterol   . Meningitis   . Enlarged prostate   . 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 Deaf     due to meningitis  . Loss of balance     due to meningitis  . Unable to walk     due to meningitis  . Eczema   . Cancer [facility]) 1956    left testicle  . Skin cancer   . Neurogenic bladder     History of   Assessment: Patient is a 80 y.o M presented to the ED on 2/9 with c/o confusion,  fever and suspected infected toe. UA with (+) nitrite and large leukocytes.  To start ceftriaxone for UTI.  Plan:  - ceftriaxone 1 gm IV q24h - pharmacy will sign off since no renal adjustment is needed with this abx.  Re-consult [place] if need further assistance.  Celine Dishman P 03/14/2015,8:59 PM

## 2015-03-14 NOTE — ED Notes (Signed)
Attempted 2nd IV access.  Unsuccessful, advised Tom, Therapist, sports on 5E

## 2015-03-14 NOTE — ED Notes (Signed)
Report called to floor.  CT ordered.  Attempting to get CT prior to sending to floor.

## 2015-03-15 ENCOUNTER — Encounter (HOSPITAL_COMMUNITY): Payer: Self-pay | Admitting: *Deleted

## 2015-03-15 DIAGNOSIS — R9431 Abnormal electrocardiogram [ECG] [EKG]: Secondary | ICD-10-CM | POA: Diagnosis present

## 2015-03-15 DIAGNOSIS — J438 Other emphysema: Secondary | ICD-10-CM | POA: Diagnosis not present

## 2015-03-15 DIAGNOSIS — Z823 Family history of stroke: Secondary | ICD-10-CM | POA: Diagnosis not present

## 2015-03-15 DIAGNOSIS — H540 Blindness, both eyes: Secondary | ICD-10-CM | POA: Diagnosis present

## 2015-03-15 DIAGNOSIS — D649 Anemia, unspecified: Secondary | ICD-10-CM | POA: Diagnosis present

## 2015-03-15 DIAGNOSIS — Z7982 Long term (current) use of aspirin: Secondary | ICD-10-CM | POA: Diagnosis not present

## 2015-03-15 DIAGNOSIS — Z8661 Personal history of infections of the central nervous system: Secondary | ICD-10-CM | POA: Diagnosis not present

## 2015-03-15 DIAGNOSIS — E785 Hyperlipidemia, unspecified: Secondary | ICD-10-CM | POA: Diagnosis not present

## 2015-03-15 DIAGNOSIS — B964 Proteus (mirabilis) (morganii) as the cause of diseases classified elsewhere: Secondary | ICD-10-CM | POA: Diagnosis not present

## 2015-03-15 DIAGNOSIS — E222 Syndrome of inappropriate secretion of antidiuretic hormone: Secondary | ICD-10-CM | POA: Diagnosis not present

## 2015-03-15 DIAGNOSIS — E871 Hypo-osmolality and hyponatremia: Secondary | ICD-10-CM | POA: Diagnosis not present

## 2015-03-15 DIAGNOSIS — I10 Essential (primary) hypertension: Secondary | ICD-10-CM | POA: Diagnosis not present

## 2015-03-15 DIAGNOSIS — N179 Acute kidney failure, unspecified: Secondary | ICD-10-CM | POA: Diagnosis not present

## 2015-03-15 DIAGNOSIS — Y846 Urinary catheterization as the cause of abnormal reaction of the patient, or of later complication, without mention of misadventure at the time of the procedure: Secondary | ICD-10-CM | POA: Diagnosis not present

## 2015-03-15 DIAGNOSIS — Z794 Long term (current) use of insulin: Secondary | ICD-10-CM | POA: Diagnosis not present

## 2015-03-15 DIAGNOSIS — G9340 Encephalopathy, unspecified: Secondary | ICD-10-CM | POA: Diagnosis not present

## 2015-03-15 DIAGNOSIS — Z87891 Personal history of nicotine dependence: Secondary | ICD-10-CM | POA: Diagnosis not present

## 2015-03-15 DIAGNOSIS — N400 Enlarged prostate without lower urinary tract symptoms: Secondary | ICD-10-CM | POA: Diagnosis not present

## 2015-03-15 DIAGNOSIS — D501 Sideropenic dysphagia: Secondary | ICD-10-CM | POA: Diagnosis not present

## 2015-03-15 DIAGNOSIS — N390 Urinary tract infection, site not specified: Secondary | ICD-10-CM | POA: Diagnosis not present

## 2015-03-15 DIAGNOSIS — Z66 Do not resuscitate: Secondary | ICD-10-CM | POA: Diagnosis present

## 2015-03-15 DIAGNOSIS — L309 Dermatitis, unspecified: Secondary | ICD-10-CM | POA: Diagnosis present

## 2015-03-15 DIAGNOSIS — N319 Neuromuscular dysfunction of bladder, unspecified: Secondary | ICD-10-CM | POA: Diagnosis not present

## 2015-03-15 DIAGNOSIS — E860 Dehydration: Secondary | ICD-10-CM | POA: Diagnosis not present

## 2015-03-15 DIAGNOSIS — Z8249 Family history of ischemic heart disease and other diseases of the circulatory system: Secondary | ICD-10-CM | POA: Diagnosis not present

## 2015-03-15 DIAGNOSIS — L03115 Cellulitis of right lower limb: Secondary | ICD-10-CM | POA: Diagnosis not present

## 2015-03-15 DIAGNOSIS — R269 Unspecified abnormalities of gait and mobility: Secondary | ICD-10-CM

## 2015-03-15 DIAGNOSIS — Z85828 Personal history of other malignant neoplasm of skin: Secondary | ICD-10-CM | POA: Diagnosis not present

## 2015-03-15 DIAGNOSIS — R262 Difficulty in walking, not elsewhere classified: Secondary | ICD-10-CM | POA: Diagnosis present

## 2015-03-15 DIAGNOSIS — H9190 Unspecified hearing loss, unspecified ear: Secondary | ICD-10-CM | POA: Diagnosis not present

## 2015-03-15 DIAGNOSIS — T83511A Infection and inflammatory reaction due to indwelling urethral catheter, initial encounter: Secondary | ICD-10-CM | POA: Diagnosis not present

## 2015-03-15 LAB — AEROBIC CULTURE

## 2015-03-15 LAB — BASIC METABOLIC PANEL
Anion gap: 10 (ref 5–15)
BUN: 11 mg/dL (ref 6–20)
CALCIUM: 8.1 mg/dL — AB (ref 8.9–10.3)
CO2: 21 mmol/L — AB (ref 22–32)
CREATININE: 1.3 mg/dL — AB (ref 0.61–1.24)
Chloride: 99 mmol/L — ABNORMAL LOW (ref 101–111)
GFR calc Af Amer: 59 mL/min — ABNORMAL LOW (ref >60)
GFR, EST NON AFRICAN AMERICAN: 51 mL/min — AB (ref >60)
GLUCOSE: 109 mg/dL — AB (ref 65–99)
Potassium: 4.1 mmol/L (ref 3.5–5.1)
Sodium: 130 mmol/L — ABNORMAL LOW (ref 135–145)

## 2015-03-15 LAB — CBC
HCT: 35 % — ABNORMAL LOW (ref 39.0–52.0)
Hemoglobin: 11.6 g/dL — ABNORMAL LOW (ref 13.0–17.0)
MCH: 29.8 pg (ref 26.0–34.0)
MCHC: 33.1 g/dL (ref 30.0–36.0)
MCV: 90 fL (ref 78.0–100.0)
PLATELETS: 232 10*3/uL (ref 150–400)
RBC: 3.89 MIL/uL — AB (ref 4.22–5.81)
RDW: 14 % (ref 11.5–15.5)
WBC: 8 10*3/uL (ref 4.0–10.5)

## 2015-03-15 LAB — TROPONIN I
Troponin I: <0.03 ng/mL (ref <0.031)
Troponin I: <0.03 ng/mL (ref <0.031)

## 2015-03-15 LAB — URINE CULTURE

## 2015-03-15 NOTE — Progress Notes (Addendum)
Patient ID: Ricardo [name], male   DOB: 25-Mar-1935, 80 y.o.   MRN: [id number]  [facility] PROGRESS NOTE  Ricardo [name] [id number] DOB: 1936/01/15 DOA: 03/14/2015 PCP: [name], DO   Brief narrative:    80 year old male with a past medical history bacterial meningitis with residual blindness, hearing loss, and neurogenic bladder s/p suprapubic catheter; who presented to the emergency department with confusion, problems with balance to the point in which he was unable to walk. The daughter stated that they had just gone to the doctor one day PTA for an infection of his right fifth toe. They had taken cultures there and had started him on Bactrim for which he has only taken 2 doses.   In route with EMS patient was noted to have a temperature 101.59F and he was given 1000 mg of Tylenol. Upon admission patient was evaluated with urinalysis which was positive for large leukocyte, nitrites, many bacteria, and no epithelial cells. WBC was 9.2, hemoglobin 10.9, sodium 125, chloride 94, bicarbonate 20.  Assessment/Plan:    Acute encephalopathy - improving and looks better this AM, pt also confirms he feels better - this was likely provoked by UTI, dehydration, poor oral intake, hyponatremia  - CT of the brain unremarkable  - continue ABX, PT evaluation   Proteus UTI - due to suprapubic catheter  - continue Rocephin day #2  Cellulitis of the right foot - has only taken 2 doses of the Bactrim prior to coming in the emergency department. - area of erythema and swelling demarcated on patient's foot. - Continue Bactrim for now for coverage of staph  Abnormal EKG - Initial EKG shows signs of a possible lateral infarct that appears to be recent. - Cardiac troponins negative  - no chest pain   Acute kidney injury - close monitoring  - BMP In AM  Hyponatremia - ? citalopram versus dehydration - improving with IVF  Gait abnormality - Physical therapy to eval and treat  DVT  prophylaxis - Lovenox SQ  Code Status: DNR Family Communication:  plan of care discussed with the patient Disposition Plan: Home by 2/12  IV access:  Peripheral IV  Procedures and diagnostic studies:    Dg Chest 2 View 03/14/2015   Mild to moderate enlargement of the cardiopericardial silhouette, without edema. No pleural effusion. 2. Thoracic spondylosis.   Ct Head Wo Contrast 03/14/2015 No acute intracranial hemorrhage. Mild to moderate age-related atrophy and chronic microvascular ischemic disease. If symptoms persist and there are no contraindications, MRI may provide better evaluation if clinically indicated   Dg Foot Complete Right 03/14/2015   No acute abnormality identified. Osteoarthritic changes of right foot.   Medical Consultants:  None  Other Consultants:  PT/OT   IAnti-Infectives:   Rocephin 2/9 --> Bactrim day #2 -->   Faye Ramsay, MD  [facility] Pager [phone]  If 7PM-7AM, please contact night-coverage [email or web address] Password [facility] 03/15/2015, 6:56 PM  HPI/Subjective: No events overnight.   Objective: Filed Vitals:   03/14/15 2058 03/15/15 0630 03/15/15 1100 03/15/15 1433  BP: 120/51 140/70  126/60  Pulse: 78 92  79  Temp: 98.1 F (36.7 C) 99.6 F (37.6 C)  99.8 F (37.7 C)  TempSrc: Oral Oral  Oral  Resp: 20 20  20   SpO2: 99% 96% 96% 96%    Intake/Output Summary (Last 24 hours) at 03/15/15 1856 Last data filed at 03/15/15 1818  Gross per 24 hour  Intake 1238.33 ml  Output   4900 ml  Net -3661.67  ml    Exam:   General:  Pt is alert, not in acute distress  Cardiovascular: Regular rate and rhythm, no rubs, no gallops  Respiratory: Clear to auscultation bilaterally, no wheezing, diminished breath sounds at bases   Abdomen: Soft, non tender, non distended, bowel sounds present, no guarding  Extremities: right foot edema and erythema improving based on demarcation   Data Reviewed: Basic Metabolic Panel:  Recent Labs Lab 03/14/15 1632  03/15/15 0239  NA 125* 130*  K 4.2 4.1  CL 94* 99*  CO2 20* 21*  GLUCOSE 123* 109*  BUN 15 11  CREATININE 1.21 1.30*  CALCIUM 8.3* 8.1*   Liver Function Tests:  Recent Labs Lab 03/14/15 1632  AST 23  ALT 16*  ALKPHOS 68  BILITOT 0.8  PROT 6.4*  ALBUMIN 3.7   CBC:  Recent Labs Lab 03/14/15 1632 03/15/15 0239  WBC 9.2 8.0  NEUTROABS 7.6  --   HGB 10.9* 11.6*  HCT 31.1* 35.0*  MCV 86.1 90.0  PLT 202 232   Cardiac Enzymes:  Recent Labs Lab 03/14/15 2117 03/15/15 0239 03/15/15 0849  TROPONINI <0.03 <0.03 <0.03   Recent Results (from the past 240 hour(s))  Aerobic Culture     Status: Abnormal   Collection Time: 03/13/15  2:55 PM  Result Value Ref Range Status   Aerobic Bacterial Culture Final report (A)  Final   Result 1 Proteus mirabilis (A)  Final    Comment: Heavy growth   ANTIMICROBIAL SUSCEPTIBILITY Comment  Final    Comment:       ** S = Susceptible; I = Intermediate; R = Resistant **                    P = Positive; N = Negative             MICS are expressed in micrograms per mL    Antibiotic                 RSLT#1    RSLT#2    RSLT#3    RSLT#4 Amoxicillin/Clavulanic Acid    S Ampicillin                     I Cefepime                       S Ceftriaxone                    S Cefuroxime                     S Ciprofloxacin                  R Ertapenem                      S Gentamicin                     S Levofloxacin                   R Piperacillin                   S Tetracycline                   R Tobramycin                     S Trimethoprim/Sulfa  R   Blood culture (routine x 2)     Status: None (Preliminary result)   Collection Time: 03/14/15  4:32 PM  Result Value Ref Range Status   Specimen Description BLOOD RIGHT ANTECUBITAL  Final   Special Requests BOTTLES DRAWN AEROBIC AND ANAEROBIC [facility] EACH  Final   Culture   Final    NO GROWTH < 24 HOURS Performed at [facility]    Report Status PENDING  Incomplete   Blood culture (routine x 2)     Status: None (Preliminary result)   Collection Time: 03/14/15  4:37 PM  Result Value Ref Range Status   Specimen Description BLOOD RIGHT HAND  Final   Special Requests BOTTLES DRAWN AEROBIC AND ANAEROBIC [place]  Final   Culture   Final    NO GROWTH < 24 HOURS Performed at [facility]    Report Status PENDING  Incomplete  Urine culture     Status: None   Collection Time: 03/14/15  4:48 PM  Result Value Ref Range Status   Specimen Description URINE, CATHETERIZED  Final   Special Requests NONE  Final   Culture   Final    MULTIPLE SPECIES PRESENT, SUGGEST RECOLLECTION Performed at [facility]    Report Status 03/15/2015 FINAL  Final     Scheduled Meds: . atorvastatin  10 mg Oral q1800  . cefTRIAXone (ROCEPHIN)  IV  1 g Intravenous Q24H  . citalopram  20 mg Oral Daily  . enoxaparin (LOVENOX) injection  30 mg Subcutaneous Q24H  . lactobacillus acidophilus & bulgar  1 tablet Oral Daily  . losartan  25 mg Oral Daily  . pantoprazole  40 mg Oral Daily  . salmeterol  1 puff Inhalation BID  . silver sulfADIAZINE  1 application Topical Daily  . sulfamethoxazole-trimethoprim  1 tablet Oral BID  . traZODone  50 mg Oral QHS   Continuous Infusions:

## 2015-03-16 LAB — CBC
HEMATOCRIT: 34 % — AB (ref 39.0–52.0)
HEMOGLOBIN: 11.5 g/dL — AB (ref 13.0–17.0)
MCH: 30 pg (ref 26.0–34.0)
MCHC: 33.8 g/dL (ref 30.0–36.0)
MCV: 88.8 fL (ref 78.0–100.0)
Platelets: 201 10*3/uL (ref 150–400)
RBC: 3.83 MIL/uL — ABNORMAL LOW (ref 4.22–5.81)
RDW: 13.7 % (ref 11.5–15.5)
WBC: 7.3 10*3/uL (ref 4.0–10.5)

## 2015-03-16 LAB — BASIC METABOLIC PANEL
ANION GAP: 8 (ref 5–15)
BUN: 10 mg/dL (ref 6–20)
CHLORIDE: 96 mmol/L — AB (ref 101–111)
CO2: 23 mmol/L (ref 22–32)
Calcium: 7.9 mg/dL — ABNORMAL LOW (ref 8.9–10.3)
Creatinine, Ser: 1.21 mg/dL (ref 0.61–1.24)
GFR calc non Af Amer: 56 mL/min — ABNORMAL LOW (ref >60)
GLUCOSE: 104 mg/dL — AB (ref 65–99)
Potassium: 3.7 mmol/L (ref 3.5–5.1)
Sodium: 127 mmol/L — ABNORMAL LOW (ref 135–145)

## 2015-03-16 NOTE — Progress Notes (Signed)
Patient ID: Ricardo [name], male   DOB: [birth year], 80 y.o.   MRN: [id number]  [facility] PROGRESS NOTE  Ricardo [name] [id number] DOB: 23-Nov-1935 DOA: 03/14/2015 PCP: [name], DO   Brief narrative:    80 year old male with a past medical history bacterial meningitis with residual blindness, hearing loss, and neurogenic bladder s/p suprapubic catheter; who presented to the emergency department with confusion, problems with balance to the point in which he was unable to walk. The daughter stated that they had just gone to the doctor one day PTA for an infection of his right fifth toe. They had taken cultures there and had started him on Bactrim for which he has only taken 2 doses.   In route with EMS patient was noted to have a temperature 101.58F and he was given 1000 mg of Tylenol. Upon admission patient was evaluated with urinalysis which was positive for large leukocyte, nitrites, many bacteria, and no epithelial cells. WBC was 9.2, hemoglobin 10.9, sodium 125, chloride 94, bicarbonate 20.  Assessment/Plan:    Acute encephalopathy - improving and looks much better this AM, pt also confirms he feels better - this was likely provoked by UTI, dehydration, poor oral intake, hyponatremia  - CT of the brain unremarkable  - continue ABX  Proteus UTI - due to suprapubic catheter  - continue Rocephin day #3  Cellulitis of the right foot - has only taken 2 doses of the Bactrim prior to coming in the emergency department. - area of erythema and swelling demarcated on patient's foot, much better this AM - Continue Bactrim for now for coverage of staph  Abnormal EKG - Initial EKG shows signs of a possible lateral infarct that appears to be recent. - Cardiac troponins negative  - no chest pain   Acute kidney injury - pre renal in etiology, resolved  - BMP In AM  Hyponatremia - ? citalopram versus dehydration - monitor for now   Gait abnormality - Physical therapy  done, no further recommendations   DVT prophylaxis - Lovenox SQ  Code Status: DNR Family Communication:  plan of care discussed with the patient and his daughter at bedside  Disposition Plan: Home by 2/13  IV access:  Peripheral IV  Procedures and diagnostic studies:    Dg Chest 2 View 03/14/2015   Mild to moderate enlargement of the cardiopericardial silhouette, without edema. No pleural effusion. 2. Thoracic spondylosis.   Ct Head Wo Contrast 03/14/2015 No acute intracranial hemorrhage. Mild to moderate age-related atrophy and chronic microvascular ischemic disease. If symptoms persist and there are no contraindications, MRI may provide better evaluation if clinically indicated   Dg Foot Complete Right 03/14/2015   No acute abnormality identified. Osteoarthritic changes of right foot.   Medical Consultants:  None  Other Consultants:  PT/OT   IAnti-Infectives:   Rocephin 2/9 --> Bactrim day #3/7 -->   Faye Ramsay, MD  [facility] Pager [phone]  If 7PM-7AM, please contact night-coverage [email or web address] Password [email or web address] 03/16/2015, 1:29 PM  HPI/Subjective: No events overnight.   Objective: Filed Vitals:   03/15/15 1433 03/15/15 1941 03/15/15 2025 03/16/15 0536  BP: 126/60  119/48 118/68  Pulse: 79  79 77  Temp: 99.8 F (37.7 C)  97.9 F (36.6 C) 97.6 F (36.4 C)  TempSrc: Oral  Oral Oral  Resp: 20  22 20   SpO2: 96% 98% 98% 99%    Intake/Output Summary (Last 24 hours) at 03/16/15 1329 Last data filed at 03/16/15 0800  Gross per 24 hour  Intake    960 ml  Output   3200 ml  Net  -2240 ml    Exam:   General:  Pt is alert, not in acute distress  Cardiovascular: Regular rate and rhythm, no rubs, no gallops  Respiratory: Clear to auscultation bilaterally, no wheezing, diminished breath sounds at bases   Abdomen: Soft, non tender, non distended, bowel sounds present, no guarding  Extremities: right foot edema and erythema almost resolved   Data  Reviewed: Basic Metabolic Panel:  Recent Labs Lab 03/14/15 1632 03/15/15 0239 03/16/15 0550  NA 125* 130* 127*  K 4.2 4.1 3.7  CL 94* 99* 96*  CO2 20* 21* 23  GLUCOSE 123* 109* 104*  BUN 15 11 10   CREATININE 1.21 1.30* 1.21  CALCIUM 8.3* 8.1* 7.9*   Liver Function Tests:  Recent Labs Lab 03/14/15 1632  AST 23  ALT 16*  ALKPHOS 68  BILITOT 0.8  PROT 6.4*  ALBUMIN 3.7   CBC:  Recent Labs Lab 03/14/15 1632 03/15/15 0239 03/16/15 0550  WBC 9.2 8.0 7.3  NEUTROABS 7.6  --   --   HGB 10.9* 11.6* 11.5*  HCT 31.1* 35.0* 34.0*  MCV 86.1 90.0 88.8  PLT 202 232 201   Cardiac Enzymes:  Recent Labs Lab 03/14/15 2117 03/15/15 0239 03/15/15 0849  TROPONINI <0.03 <0.03 <0.03   Recent Results (from the past 240 hour(s))  Aerobic Culture     Status: Abnormal   Collection Time: 03/13/15  2:55 PM  Result Value Ref Range Status   Aerobic Bacterial Culture Final report (A)  Final   Result 1 Proteus mirabilis (A)  Final    Comment: Heavy growth   ANTIMICROBIAL SUSCEPTIBILITY Comment  Final    Comment:       ** S = Susceptible; I = Intermediate; R = Resistant **                    P = Positive; N = Negative             MICS are expressed in micrograms per mL    Antibiotic                 RSLT#1    RSLT#2    RSLT#3    RSLT#4 Amoxicillin/Clavulanic Acid    S Ampicillin                     I Cefepime                       S Ceftriaxone                    S Cefuroxime                     S Ciprofloxacin                  R Ertapenem                      S Gentamicin                     S Levofloxacin                   R Piperacillin                   S Tetracycline  R Tobramycin                     S Trimethoprim/Sulfa             R   Blood culture (routine x 2)     Status: None (Preliminary result)   Collection Time: 03/14/15  4:32 PM  Result Value Ref Range Status   Specimen Description BLOOD RIGHT ANTECUBITAL  Final   Special Requests BOTTLES  DRAWN AEROBIC AND ANAEROBIC [facility] EACH  Final   Culture   Final    NO GROWTH 2 DAYS Performed at [facility]    Report Status PENDING  Incomplete  Blood culture (routine x 2)     Status: None (Preliminary result)   Collection Time: 03/14/15  4:37 PM  Result Value Ref Range Status   Specimen Description BLOOD RIGHT HAND  Final   Special Requests BOTTLES DRAWN AEROBIC AND ANAEROBIC [place]  Final   Culture   Final    NO GROWTH 2 DAYS Performed at [facility]    Report Status PENDING  Incomplete  Urine culture     Status: None   Collection Time: 03/14/15  4:48 PM  Result Value Ref Range Status   Specimen Description URINE, CATHETERIZED  Final   Special Requests NONE  Final   Culture   Final    MULTIPLE SPECIES PRESENT, SUGGEST RECOLLECTION Performed at [facility]    Report Status 03/15/2015 FINAL  Final     Scheduled Meds: . atorvastatin  10 mg Oral q1800  . cefTRIAXone (ROCEPHIN)  IV  1 g Intravenous Q24H  . citalopram  20 mg Oral Daily  . enoxaparin (LOVENOX) injection  30 mg Subcutaneous Q24H  . lactobacillus acidophilus & bulgar  1 tablet Oral Daily  . losartan  25 mg Oral Daily  . pantoprazole  40 mg Oral Daily  . salmeterol  1 puff Inhalation BID  . silver sulfADIAZINE  1 application Topical Daily  . sulfamethoxazole-trimethoprim  1 tablet Oral BID  . traZODone  50 mg Oral QHS

## 2015-03-16 NOTE — Evaluation (Signed)
Physical Therapy Evaluation Patient Details Name: Ricardo [name] MRN: [id number] DOB: 06/12/1935 Today's Date: 03/16/2015   History of Present Illness  80 yo male admitted with acute encephalopathy, UTI. Hx of bacterial meningitis, blindness, hearing loss, neurogenic bladder, R foot cellulitis  Clinical Impression  On eval, pt required Min assist +2 for mobility-walked ~30 feet with RW. Per daughter, pt is a high fall risk at baseline but he normally has better mobility than what was observed on today. Discussed d/c plan-daughter declines HHPT. Daughter states she feels she and aide can continue to manage patient's care once pt is ready to d/c. Will follow and progress activity as able. Encouraged pt to perform his HEP routine as able.     Follow Up Recommendations No PT follow up;Supervision/Assistance - 24 hour (daughter declines HHPT follow up)    Equipment Recommendations  None recommended by PT    Recommendations for Other Services       Precautions / Restrictions Precautions Precautions: Fall Precaution Comments: high fall risk Restrictions Weight Bearing Restrictions: No      Mobility  Bed Mobility Overal bed mobility: Needs Assistance Bed Mobility: Supine to Sit     Supine to sit: HOB elevated;Min assist     General bed mobility comments: Assist to get to EOB and to stabilize at EOB. Cues for safety, technique  Transfers Overall transfer level: Needs assistance Equipment used: Rolling walker (2 wheeled) Transfers: Sit to/from Stand Sit to Stand: Min assist;+2 safety/equipment;From elevated surface         General transfer comment: Assist to rise, stabilize, control descent. Multimodal cues for safety, hand placement. Daughter standing by for safety  Ambulation/Gait Ambulation/Gait assistance: Min assist;+2 safety/equipment Ambulation Distance (Feet): 30 Feet Assistive device: Rolling walker (2 wheeled) Gait Pattern/deviations: Wide base of support;Step-to  pattern;Staggering left;Staggering right     General Gait Details: Very unsteady. Assist to stability pt and maneuver safely with walker. VCs for safety, pacing. Daughter standing by for safety  Stairs            Wheelchair Mobility    Modified Rankin (Stroke Patients Only)       Balance Overall balance assessment: Needs assistance;History of Falls         Standing balance support: Bilateral upper extremity supported;During functional activity Standing balance-Leahy Scale: Poor Standing balance comment: high fall risk at baseline                             Pertinent Vitals/Pain Pain Assessment: No/denies pain    Home Living Family/patient expects to be discharged to:: Private residence Living Arrangements: Children Available Help at Discharge: Personal care attendant;Family;Available 24 hours/day (3x/week aide; daughter 24/7) Type of Home: House Home Access: [place]: One level Home Equipment: [place] - 2 wheels;Wheelchair - [employer];Shower seat      Prior Function Level of Independence: Needs assistance   Gait / Transfers Assistance Needed: pt walks with aide 3x/week. otherwise, transfers only with daughter and mobilizes primarily at wheelchair level  ADL's / Homemaking Assistance Needed: aide assists with getting in/out shower/bathing        Hand Dominance        Extremity/Trunk Assessment   Upper Extremity Assessment: Generalized weakness           Lower Extremity Assessment: Generalized weakness      Cervical / Trunk Assessment: Kyphotic  Communication   Communication: HOH  Cognition Arousal/Alertness:  Awake/alert Behavior During Therapy: WFL for tasks assessed/performed Overall Cognitive Status: Within Functional Limits for tasks assessed                      General Comments      Exercises        Assessment/Plan    PT Assessment Patient needs continued PT services   PT Diagnosis Difficulty walking;Abnormality of gait;Generalized weakness   PT Problem List Decreased strength;Decreased activity tolerance;Decreased balance;Decreased mobility;Decreased knowledge of use of DME  PT Treatment Interventions DME instruction;Gait training;Functional mobility training;Therapeutic activities;Patient/family education;Balance training;Therapeutic exercise   PT Goals (Current goals can be found in the Care Plan section) Acute Rehab PT Goals Patient Stated Goal: to get better.  PT Goal Formulation: With patient/family Time For Goal Achievement: 03/30/15 Potential to Achieve Goals: Fair    Frequency Min 3X/week   Barriers to discharge        Co-evaluation               End of Session Equipment Utilized During Treatment: Gait belt Activity Tolerance: Patient tolerated treatment well Patient left: in chair;with call bell/phone within reach;with family/visitor present;with chair alarm set           Time: [id number] PT Time Calculation (min) (ACUTE ONLY): 26 min   Charges:   PT Evaluation $PT Eval Moderate Complexity: 1 Procedure PT Treatments $Gait Training: 8-22 mins   PT G Codes:        Weston Anna, MPT Pager: [phone]

## 2015-03-17 ENCOUNTER — Other Ambulatory Visit: Payer: Self-pay | Admitting: Nurse Practitioner

## 2015-03-17 LAB — BASIC METABOLIC PANEL
ANION GAP: 9 (ref 5–15)
BUN: 10 mg/dL (ref 6–20)
CHLORIDE: 95 mmol/L — AB (ref 101–111)
CO2: 20 mmol/L — ABNORMAL LOW (ref 22–32)
Calcium: 7.8 mg/dL — ABNORMAL LOW (ref 8.9–10.3)
Creatinine, Ser: 1.02 mg/dL (ref 0.61–1.24)
GFR calc non Af Amer: >60 mL/min (ref >60)
Glucose, Bld: 94 mg/dL (ref 65–99)
POTASSIUM: 3.9 mmol/L (ref 3.5–5.1)
SODIUM: 124 mmol/L — AB (ref 135–145)

## 2015-03-17 LAB — CBC
HCT: 31.9 % — ABNORMAL LOW (ref 39.0–52.0)
HEMOGLOBIN: 11.2 g/dL — AB (ref 13.0–17.0)
MCH: 30.6 pg (ref 26.0–34.0)
MCHC: 35.1 g/dL (ref 30.0–36.0)
MCV: 87.2 fL (ref 78.0–100.0)
Platelets: 208 10*3/uL (ref 150–400)
RBC: 3.66 MIL/uL — AB (ref 4.22–5.81)
RDW: 13.5 % (ref 11.5–15.5)
WBC: 7.9 10*3/uL (ref 4.0–10.5)

## 2015-03-17 MED ORDER — DOXYCYCLINE HYCLATE 100 MG PO TABS
100.0000 mg | ORAL_TABLET | Freq: Two times a day (BID) | ORAL | Status: DC
  Administered 2015-03-17 – 2015-03-18 (×3): 100 mg via ORAL
  Filled 2015-03-17 (×4): qty 1

## 2015-03-17 NOTE — Progress Notes (Addendum)
Patient ID: Ricardo [name], male   DOB: January 11, 1936, 80 y.o.   MRN: [id number] [facility] PROGRESS NOTE  Ricardo [name] [id number] DOB: 11/08/1935 DOA: 03/14/2015 PCP: [name], DO  Brief narrative:    80 year old male with a past medical history bacterial meningitis with residual blindness, hearing loss and neurogenic bladder s/p suprapubic catheter who presented to the ED with confusion, problems with balance. The daughter stated that they had just gone to the doctor one day PTA for an infection of his right fifth toe. They had taken cultures there and had started him on Bactrim. Pt was febrile on admission with T max 101.6 F. His UA showed large leukocytes, many bacteria. Sodium was 125. He was started on rocephin while awaiting urine culture result.    Assessment/Plan:    Principal Problem: Acute encephalopathy / Proteus UTI in pt with suprapubic catheter - Probably due to UTI - CT head without acute intracranial findings  - One of the cultures positive for proteus but repeat Ucx with multiple species none predominant - He is on empiric rocephin - Blood cultures so far negative   Active Problems: Cellulitis of the right foot - Stop bactrim due to hyponatremia and use doxycyline   Abnormal EKG - Initial EKG shows signs of a possible lateral infarct, seems to be recent - Troponins negative   Acute kidney injury - Due to UTI - Resolved with fluids   Hyponatremia - Probably from bactrim - Stop bactrim - Check BMP in am  Dyslipidemia - Continue statin therapy   Gait abnormality - PT eval done, no need for follow up   DVT Prophylaxis  - Lovenox subQ ordered   Code Status: DNR/DNI  Family Communication:  plan of care discussed with the patient Disposition Plan: home by 01/16/2016   IV access:  Peripheral IV  Procedures and diagnostic studies:    Dg Chest 2 View 03/14/2015  1. Mild to moderate enlargement of the cardiopericardial silhouette, without  edema. No pleural effusion. 2. Thoracic spondylosis.   Ct Head Wo Contrast 03/14/2015 No acute intracranial hemorrhage. Mild to moderate age-related atrophy and chronic microvascular ischemic disease. If symptoms persist and there are no contraindications, MRI may provide better evaluation if clinically indicated.  Dg Foot Complete Right 03/14/2015   No acute abnormality identified. Osteoarthritic changes of right foot.   Medical Consultants:  None   Other Consultants:  PT  IAnti-Infectives:   Rocephin 2/9 --> Bactrim day #4/7 --> 12/12 Doxycyline 12/12 -->    Leisa Lenz, MD  [facility] Pager [phone]  Time spent in minutes: 25 minutes  If 7PM-7AM, please contact night-coverage [email or web address] Password [email or web address] 03/17/2015, 1:14 PM   LOS: 2 days    HPI/Subjective: No acute overnight events. Patient reports feeling better.   Objective: Filed Vitals:   03/16/15 2036 03/17/15 0539 03/17/15 0635 03/17/15 0913  BP: 107/48 137/54    Pulse: 71 81    Temp: 98.8 F (37.1 C) 100.3 F (37.9 C) 99.8 F (37.7 C)   TempSrc: Oral Oral Oral   Resp: 16 16    SpO2: 95% 97%  95%    Intake/Output Summary (Last 24 hours) at 03/17/15 1314 Last data filed at 03/17/15 0543  Gross per 24 hour  Intake    360 ml  Output   2500 ml  Net  -2140 ml    Exam:   General:  Pt is alert, follows commands appropriately, not in acute distress  Cardiovascular: Regular rate and rhythm, S1/S2 (+)  Respiratory: Clear to auscultation bilaterally, no wheezing, no crackles, no rhonchi  Abdomen: Soft, non tender, non distended, bowel sounds present  Extremities: No edema, pulses DP and PT palpable bilaterally  Neuro: Grossly nonfocal  Data Reviewed: Basic Metabolic Panel:  Recent Labs Lab 03/14/15 1632 03/15/15 0239 03/16/15 0550 03/17/15 0602  NA 125* 130* 127* 124*  K 4.2 4.1 3.7 3.9  CL 94* 99* 96* 95*  CO2 20* 21* 23 20*  GLUCOSE 123* 109* 104* 94  BUN 15 11 10 10    CREATININE 1.21 1.30* 1.21 1.02  CALCIUM 8.3* 8.1* 7.9* 7.8*   Liver Function Tests:  Recent Labs Lab 03/14/15 1632  AST 23  ALT 16*  ALKPHOS 68  BILITOT 0.8  PROT 6.4*  ALBUMIN 3.7   No results for input(s): LIPASE, AMYLASE in the last 168 hours. No results for input(s): AMMONIA in the last 168 hours. CBC:  Recent Labs Lab 03/14/15 1632 03/15/15 0239 03/16/15 0550 03/17/15 0602  WBC 9.2 8.0 7.3 7.9  NEUTROABS 7.6  --   --   --   HGB 10.9* 11.6* 11.5* 11.2*  HCT 31.1* 35.0* 34.0* 31.9*  MCV 86.1 90.0 88.8 87.2  PLT 202 232 201 208   Cardiac Enzymes:  Recent Labs Lab 03/14/15 2117 03/15/15 0239 03/15/15 0849  TROPONINI <0.03 <0.03 <0.03   BNP: Invalid input(s): POCBNP CBG: No results for input(s): GLUCAP in the last 168 hours.  Recent Results (from the past 240 hour(s))  Aerobic Culture     Status: Abnormal   Collection Time: 03/13/15  2:55 PM  Result Value Ref Range Status   Aerobic Bacterial Culture Final report (A)  Final   Result 1 Proteus mirabilis (A)  Final    Comment: Heavy growth   ANTIMICROBIAL SUSCEPTIBILITY Comment  Final    Comment:       ** S = Susceptible; I = Intermediate; R = Resistant **                    P = Positive; N = Negative             MICS are expressed in micrograms per mL    Antibiotic                 RSLT#1    RSLT#2    RSLT#3    RSLT#4 Amoxicillin/Clavulanic Acid    S Ampicillin                     I Cefepime                       S Ceftriaxone                    S Cefuroxime                     S Ciprofloxacin                  R Ertapenem                      S Gentamicin                     S Levofloxacin                   R Piperacillin  S Tetracycline                   R Tobramycin                     S Trimethoprim/Sulfa             R   Blood culture (routine x 2)     Status: None (Preliminary result)   Collection Time: 03/14/15  4:32 PM  Result Value Ref Range Status   Specimen  Description BLOOD RIGHT ANTECUBITAL  Final   Special Requests BOTTLES DRAWN AEROBIC AND ANAEROBIC [facility] EACH  Final   Culture   Final    NO GROWTH 3 DAYS Performed at [facility]    Report Status PENDING  Incomplete  Blood culture (routine x 2)     Status: None (Preliminary result)   Collection Time: 03/14/15  4:37 PM  Result Value Ref Range Status   Specimen Description BLOOD RIGHT HAND  Final   Special Requests BOTTLES DRAWN AEROBIC AND ANAEROBIC [place]  Final   Culture   Final    NO GROWTH 3 DAYS Performed at [facility]    Report Status PENDING  Incomplete  Urine culture     Status: None   Collection Time: 03/14/15  4:48 PM  Result Value Ref Range Status   Specimen Description URINE, CATHETERIZED  Final   Special Requests NONE  Final   Culture   Final    MULTIPLE SPECIES PRESENT, SUGGEST RECOLLECTION Performed at [facility]    Report Status 03/15/2015 FINAL  Final     Scheduled Meds: . atorvastatin  10 mg Oral q1800  . cefTRIAXone (ROCEPHIN)  IV  1 g Intravenous Q24H  . citalopram  20 mg Oral Daily  . doxycycline  100 mg Oral Q12H  . enoxaparin (LOVENOX) injection  30 mg Subcutaneous Q24H  . lactobacillus acidophilus & bulgar  1 tablet Oral Daily  . losartan  25 mg Oral Daily  . pantoprazole  40 mg Oral Daily  . salmeterol  1 puff Inhalation BID  . silver sulfADIAZINE  1 application Topical Daily  . traZODone  50 mg Oral QHS

## 2015-03-18 DIAGNOSIS — E785 Hyperlipidemia, unspecified: Secondary | ICD-10-CM

## 2015-03-18 LAB — BASIC METABOLIC PANEL
Anion gap: 9 (ref 5–15)
BUN: 11 mg/dL (ref 6–20)
CALCIUM: 8 mg/dL — AB (ref 8.9–10.3)
CO2: 21 mmol/L — ABNORMAL LOW (ref 22–32)
CREATININE: 0.97 mg/dL (ref 0.61–1.24)
Chloride: 92 mmol/L — ABNORMAL LOW (ref 101–111)
GFR calc non Af Amer: >60 mL/min (ref >60)
Glucose, Bld: 98 mg/dL (ref 65–99)
Potassium: 3.8 mmol/L (ref 3.5–5.1)
SODIUM: 122 mmol/L — AB (ref 135–145)

## 2015-03-18 MED ORDER — SODIUM CHLORIDE 0.9 % IV SOLN
INTRAVENOUS | Status: DC
  Administered 2015-03-18 – 2015-03-20 (×2): via INTRAVENOUS

## 2015-03-18 NOTE — Progress Notes (Signed)
Patient took a nap after lunch after getting up to chair and then back to bed. Around 17:30, patient woke up & vomited his entire spaghetti lunch. Patient stated he felt much better after this episode. He drank ginger ale but did not eat any dinner. He states that he no longer feels nauseous.

## 2015-03-18 NOTE — Care Management Important Message (Signed)
Important Message  Patient Details  Name: Ricardo [name] MRN: [id number] Date of Birth: 06-30-1935   Medicare Important Message Given:  Yes    Camillo Flaming 03/18/2015, 2:59 [place] Message  Patient Details  Name: Ricardo [name] MRN: [id number] Date of Birth: 1935/03/10   Medicare Important Message Given:  Yes    Camillo Flaming 03/18/2015, 2:59 PM

## 2015-03-18 NOTE — Progress Notes (Signed)
Date: March 18, 2015 Chart reviewed for concurrent status and case management needs. Will continue to follow patient for changes and needs:  [address]/iv flds Velva Harman, BSN, [place], [place]   [phone]

## 2015-03-18 NOTE — Progress Notes (Signed)
Patient ID: Ricardo [name], male   DOB: 1935/07/06, 80 y.o.   MRN: [id number] [facility] PROGRESS NOTE  Ricardo [name] [id number] DOB: 1935/11/25 DOA: 03/14/2015 PCP: [name], DO  Brief narrative:    80 year old male with a past medical history bacterial meningitis with residual blindness, hearing loss and neurogenic bladder s/p suprapubic catheter who presented to the ED with confusion, problems with balance. The daughter stated that they had just gone to the doctor one day PTA for an infection of his right fifth toe. They had taken cultures there and had started him on Bactrim. Pt was febrile on admission with T max 101.6 F. His UA showed large leukocytes, many bacteria. Sodium was 125. He was started on rocephin while awaiting urine culture result.  Barrier to discharge: Ongoing hyponatremia.  Assessment/Plan:    Principal Problem: Acute encephalopathy / Proteus UTI in pt with suprapubic catheter - Altered mental status on the admission likely due to urinary tract infection - Much better, much improved - CT head without acute intracranial findings  - One of the cultures positive for proteus but repeat Ucx with multiple species none predominant - Continue Rocephin - Blood cultures to date are negative  Active Problems: Cellulitis of the right foot - Stopped bactrim 2/12 due to hyponatremia and started doxycyline but cellulitis improved actually completely resolved so we will stop doxycycline today.  Abnormal EKG - Initial EKG shows signs of a possible lateral infarct, seems to be recent - Troponins negative   Acute kidney injury - Secondary to UTI - Resolved with fluids   Hyponatremia - Probably from bactrim - Stopped bactrim 2/12 - Sodium down to 122 this am - Start low rate IV fluids at 50 cc/hr   Dyslipidemia - Continue statin therapy   Gait abnormality - PT eval done, no need for follow up   DVT Prophylaxis  - Lovenox subQ ordered while pt in  hospital   Code Status: DNR/DNI  Family Communication:  plan of care discussed with the patient and his daughter at the bedside  Disposition Plan: home once sodium closer to 130  IV access:  Peripheral IV  Procedures and diagnostic studies:    Dg Chest 2 View 03/14/2015  1. Mild to moderate enlargement of the cardiopericardial silhouette, without edema. No pleural effusion. 2. Thoracic spondylosis.   Ct Head Wo Contrast 03/14/2015 No acute intracranial hemorrhage. Mild to moderate age-related atrophy and chronic microvascular ischemic disease. If symptoms persist and there are no contraindications, MRI may provide better evaluation if clinically indicated.  Dg Foot Complete Right 03/14/2015   No acute abnormality identified. Osteoarthritic changes of right foot.   Medical Consultants:  None   Other Consultants:  PT  IAnti-Infectives:   Rocephin 2/9 --> Bactrim day #4/7 --> 12/12 Doxycyline 12/12 --> 12/13   Leisa Lenz, MD  [facility] Pager [phone]  Time spent in minutes: 25 minutes  If 7PM-7AM, please contact night-coverage [email or web address] Password [email or web address] 03/18/2015, 11:02 AM   LOS: 3 days    HPI/Subjective: No acute overnight events. Patient reports feeling better this am.  Objective: Filed Vitals:   03/17/15 1945 03/17/15 2112 03/18/15 0620 03/18/15 0859  BP:  123/55 138/74   Pulse:  68 73   Temp:  97.8 F (36.6 C) 98.1 F (36.7 C)   TempSrc:  Oral Oral   Resp:  19 19   SpO2: 93% 95% 95% 95%    Intake/Output Summary (Last 24 hours) at 03/18/15 1102 Last data filed at  03/18/15 0925  Gross per 24 hour  Intake    840 ml  Output   3550 ml  Net  -2710 ml    Exam:   General:  Pt is alert, not in acute distress  Cardiovascular: RRR, S1/S2 appreciated   Respiratory: No wheezing, no crackles, no rhonchi  Abdomen: (+) BS, non tender   Extremities: No swelling, pulses palpable bilaterally  Neuro: Nonfocal  Data Reviewed: Basic Metabolic  Panel:  Recent Labs Lab 03/14/15 1632 03/15/15 0239 03/16/15 0550 03/17/15 0602 03/18/15 0633  NA 125* 130* 127* 124* 122*  K 4.2 4.1 3.7 3.9 3.8  CL 94* 99* 96* 95* 92*  CO2 20* 21* 23 20* 21*  GLUCOSE 123* 109* 104* 94 98  BUN 15 11 10 10 11   CREATININE 1.21 1.30* 1.21 1.02 0.97  CALCIUM 8.3* 8.1* 7.9* 7.8* 8.0*   Liver Function Tests:  Recent Labs Lab 03/14/15 1632  AST 23  ALT 16*  ALKPHOS 68  BILITOT 0.8  PROT 6.4*  ALBUMIN 3.7   No results for input(s): LIPASE, AMYLASE in the last 168 hours. No results for input(s): AMMONIA in the last 168 hours. CBC:  Recent Labs Lab 03/14/15 1632 03/15/15 0239 03/16/15 0550 03/17/15 0602  WBC 9.2 8.0 7.3 7.9  NEUTROABS 7.6  --   --   --   HGB 10.9* 11.6* 11.5* 11.2*  HCT 31.1* 35.0* 34.0* 31.9*  MCV 86.1 90.0 88.8 87.2  PLT 202 232 201 208   Cardiac Enzymes:  Recent Labs Lab 03/14/15 2117 03/15/15 0239 03/15/15 0849  TROPONINI <0.03 <0.03 <0.03   BNP: Invalid input(s): POCBNP CBG: No results for input(s): GLUCAP in the last 168 hours.  Recent Results (from the past 240 hour(s))  Aerobic Culture     Status: Abnormal   Collection Time: 03/13/15  2:55 PM  Result Value Ref Range Status   Aerobic Bacterial Culture Final report (A)  Final   Result 1 Proteus mirabilis (A)  Final    Comment: Heavy growth   ANTIMICROBIAL SUSCEPTIBILITY Comment  Final    Comment:       ** S = Susceptible; I = Intermediate; R = Resistant **                    P = Positive; N = Negative             MICS are expressed in micrograms per mL    Antibiotic                 RSLT#1    RSLT#2    RSLT#3    RSLT#4 Amoxicillin/Clavulanic Acid    S Ampicillin                     I Cefepime                       S Ceftriaxone                    S Cefuroxime                     S Ciprofloxacin                  R Ertapenem                      S Gentamicin  S Levofloxacin                   R Piperacillin                    S Tetracycline                   R Tobramycin                     S Trimethoprim/Sulfa             R   Blood culture (routine x 2)     Status: None (Preliminary result)   Collection Time: 03/14/15  4:32 PM  Result Value Ref Range Status   Specimen Description BLOOD RIGHT ANTECUBITAL  Final   Special Requests BOTTLES DRAWN AEROBIC AND ANAEROBIC [facility] EACH  Final   Culture   Final    NO GROWTH 3 DAYS Performed at [facility]    Report Status PENDING  Incomplete  Blood culture (routine x 2)     Status: None (Preliminary result)   Collection Time: 03/14/15  4:37 PM  Result Value Ref Range Status   Specimen Description BLOOD RIGHT HAND  Final   Special Requests BOTTLES DRAWN AEROBIC AND ANAEROBIC [place]  Final   Culture   Final    NO GROWTH 3 DAYS Performed at [facility]    Report Status PENDING  Incomplete  Urine culture     Status: None   Collection Time: 03/14/15  4:48 PM  Result Value Ref Range Status   Specimen Description URINE, CATHETERIZED  Final   Special Requests NONE  Final   Culture   Final    MULTIPLE SPECIES PRESENT, SUGGEST RECOLLECTION Performed at [facility]    Report Status 03/15/2015 FINAL  Final     Scheduled Meds: . atorvastatin  10 mg Oral q1800  . cefTRIAXone (ROCEPHIN)  IV  1 g Intravenous Q24H  . citalopram  20 mg Oral Daily  . enoxaparin (LOVENOX) injection  30 mg Subcutaneous Q24H  . lactobacillus acidophilus & bulgar  1 tablet Oral Daily  . losartan  25 mg Oral Daily  . pantoprazole  40 mg Oral Daily  . salmeterol  1 puff Inhalation BID  . silver sulfADIAZINE  1 application Topical Daily  . traZODone  50 mg Oral QHS

## 2015-03-18 NOTE — Progress Notes (Signed)
Physical Therapy Treatment Patient Details Name: Ricardo [name] MRN: [id number] DOB: [birth year] Today's Date: 03/18/2015    History of Present Illness 80 yo male admitted with acute encephalopathy, UTI. Hx of bacterial meningitis, blindness, hearing loss, neurogenic bladder, R foot cellulitis    PT Comments    Pt tolerated increased distance with ambulation and continues to require +2 assist for safety due to poor balance.   Follow Up Recommendations  No PT follow up;Supervision/Assistance - 24 hour (daughter declines HHPT follow up)     Equipment Recommendations  None recommended by PT    Recommendations for Other Services       Precautions / Restrictions Precautions Precautions: Fall Precaution Comments: high fall risk Restrictions Weight Bearing Restrictions: No    Mobility  Bed Mobility Overal bed mobility: Needs Assistance Bed Mobility: Supine to Sit     Supine to sit: HOB elevated;Max assist     General bed mobility comments: Max A to raise trunk and to scoot forward to EOB  Transfers Overall transfer level: Needs assistance Equipment used: Rolling walker (2 wheeled) Transfers: Sit to/from Stand Sit to Stand: +2 safety/equipment;From elevated surface;Mod assist         General transfer comment: Assist to rise, stabilize, control descent. Multimodal cues for safety, hand placement.   Ambulation/Gait Ambulation/Gait assistance: +2 physical assistance;Mod assist Ambulation Distance (Feet): 65 Feet Assistive device: Rolling walker (2 wheeled) Gait Pattern/deviations: Step-through pattern;Decreased step length - right;Decreased step length - left   Gait velocity interpretation: at or above normal speed for age/gender General Gait Details: Very unsteady. Assist for stability pt and to maneuver safely with walker. VCs to increase step length.    Stairs            Wheelchair Mobility    Modified Rankin (Stroke Patients Only)       Balance             Standing balance-Leahy Scale: Poor   Posterior lean with sitting on edge of bed, requires mod assist.                      Cognition Arousal/Alertness: Awake/alert Behavior During Therapy: WFL for tasks assessed/performed Overall Cognitive Status: Within Functional Limits for tasks assessed                      Exercises      General Comments        Pertinent Vitals/Pain Pain Assessment: No/denies pain    Home Living                      Prior Function            PT Goals (current goals can now be found in the care plan section) Acute Rehab PT Goals Patient Stated Goal: to get better.  PT Goal Formulation: With patient Time For Goal Achievement: 03/30/15 Potential to Achieve Goals: Fair Progress towards PT goals: Progressing toward goals    Frequency  Min 3X/week    PT Plan Current plan remains appropriate    Co-evaluation             End of Session Equipment Utilized During Treatment: Gait belt Activity Tolerance: Patient tolerated treatment well Patient left: in chair;with call bell/phone within reach;with chair alarm set     Time: [id number] PT Time Calculation (min) (ACUTE ONLY): 19 min  Charges:  $Gait Training: 8-22 mins  G Codes:      Philomena Doheny 03/18/2015, 11:44 AM

## 2015-03-18 NOTE — Evaluation (Signed)
Occupational Therapy Evaluation Patient Details Name: FRIDDIE CLOWES MRN: [id number] DOB: 1935/04/28 Today's Date: 03/18/2015    History of Present Illness 80 yo male admitted with acute encephalopathy, UTI. Hx of bacterial meningitis, blindness, hearing loss, neurogenic bladder, R foot cellulitis   Clinical Impression   Pt admitted with acute encephalopathy. Pt currently with functional limitations due to the deficits listed below (see OT Problem List).  Pt will benefit from skilled OT to increase their safety and independence with ADL and functional mobility for ADL to facilitate discharge to venue listed below.      Follow Up Recommendations  Home health OT;Supervision/Assistance - 24 hour    Equipment Recommendations  None recommended by OT;Other (comment) (pt has all needed DME)       Precautions / Restrictions Precautions Precautions: Fall Precaution Comments: high fall risk Restrictions Weight Bearing Restrictions: No      Mobility Bed Mobility Overal bed mobility: Needs Assistance Bed Mobility: Supine to Sit     Supine to sit: HOB elevated;Max assist     General bed mobility comments: pt in chair  Transfers Overall transfer level: Needs assistance Equipment used: Rolling walker (2 wheeled) Transfers: Sit to/from Stand Sit to Stand: Mod assist;+2 physical assistance         General transfer comment: Assist to rise, stabilize, control descent. Multimodal cues for safety, hand placement.     Balance   Sitting-balance support: Feet supported;Bilateral upper extremity supported Sitting balance-Leahy Scale: Poor   Postural control: Posterior lean   Standing balance-Leahy Scale: Poor                              ADL Overall ADL's : Needs assistance/impaired Eating/Feeding: Sitting;Total assistance   Grooming: Maximal assistance;Sitting                                 General ADL Comments: Pt sitting in chair and is  exhausted post PT.  Pts daughter and aide provide 24/7 A.  Pt currently not at a level daughter can care for him.       Vision  pt is blind          Pertinent Vitals/Pain Pain Assessment: No/denies pain           Communication Communication Communication: HOH   Cognition Arousal/Alertness: Awake/alert Behavior During Therapy: WFL for tasks assessed/performed Overall Cognitive Status: Within Functional Limits for tasks assessed                     General Comments   daughter and caregiver provide 24/7            Home Living Family/patient expects to be discharged to:: Private residence Living Arrangements: Children Available Help at Discharge: Personal care attendant;Family;Available 24 hours/day (3x/week aide; daughter 24/7) Type of Home: House Home Access: Ramped entrance     [place]: One level     Bathroom Shower/Tub: Occupational psychologist: Handicapped height     Home Equipment: Environmental consultant - 2 wheels;Wheelchair - [employer];Shower seat          Prior Functioning/Environment Level of Independence: Needs assistance  Gait / Transfers Assistance Needed: pt walks with aide 3x/week. otherwise, transfers only with daughter and mobilizes primarily at wheelchair level ADL's / Homemaking Assistance Needed: aide assists with getting in/out shower/bathing  OT Diagnosis: Generalized weakness   OT Problem List: Decreased activity tolerance;Decreased safety awareness;Decreased strength;Impaired vision/perception   OT Treatment/Interventions: Self-care/ADL training;Patient/family education    OT Goals(Current goals can be found in the care plan section) Acute Rehab OT Goals Patient Stated Goal: to get better.   OT Frequency: Min 2X/week              End of Session Equipment Utilized During Treatment: Surveyor, mining Communication: Mobility status  Activity Tolerance: Patient limited by fatigue Patient left: in  chair;with family/visitor present   Time: 1150-1210 OT Time Calculation (min): 20 min Charges:  OT General Charges $OT Visit: 1 Procedure OT Evaluation $OT Eval Low Complexity: 1 Procedure G-Codes:    Betsy Pries 04-02-15, 12:22 PM

## 2015-03-19 LAB — BASIC METABOLIC PANEL
ANION GAP: 10 (ref 5–15)
BUN: 10 mg/dL (ref 6–20)
CHLORIDE: 91 mmol/L — AB (ref 101–111)
CO2: 21 mmol/L — ABNORMAL LOW (ref 22–32)
Calcium: 8.5 mg/dL — ABNORMAL LOW (ref 8.9–10.3)
Creatinine, Ser: 0.78 mg/dL (ref 0.61–1.24)
GFR calc Af Amer: >60 mL/min (ref >60)
GLUCOSE: 126 mg/dL — AB (ref 65–99)
POTASSIUM: 3.8 mmol/L (ref 3.5–5.1)
Sodium: 122 mmol/L — ABNORMAL LOW (ref 135–145)

## 2015-03-19 LAB — CULTURE, BLOOD (ROUTINE X 2)
CULTURE: NO GROWTH
Culture: NO GROWTH

## 2015-03-19 LAB — SODIUM, URINE, RANDOM: SODIUM UR: 90 mmol/L

## 2015-03-19 LAB — OSMOLALITY: OSMOLALITY: 248 mosm/kg — AB (ref 275–295)

## 2015-03-19 LAB — OSMOLALITY, URINE: Osmolality, Ur: 351 mOsm/kg (ref 300–900)

## 2015-03-19 MED ORDER — MAGNESIUM HYDROXIDE 400 MG/5ML PO SUSP
5.0000 mL | Freq: Every day | ORAL | Status: DC | PRN
  Administered 2015-03-19: 5 mL via ORAL
  Filled 2015-03-19: qty 30

## 2015-03-19 MED ORDER — ALPRAZOLAM 1 MG PO TABS
1.0000 mg | ORAL_TABLET | Freq: Two times a day (BID) | ORAL | Status: DC | PRN
  Administered 2015-03-19 – 2015-03-20 (×2): 1 mg via ORAL
  Filled 2015-03-19 (×2): qty 1

## 2015-03-19 MED ORDER — DOCUSATE SODIUM 100 MG PO CAPS
100.0000 mg | ORAL_CAPSULE | Freq: Two times a day (BID) | ORAL | Status: DC
  Administered 2015-03-19 – 2015-03-22 (×7): 100 mg via ORAL
  Filled 2015-03-19 (×8): qty 1

## 2015-03-19 NOTE — Progress Notes (Signed)
CRITICAL VALUE ALERT  Critical value received:  Serum Osmolality   248  Date of notification:  03/19/2015  Time of notification:  [id number]  Critical value read back:Yes.    Nurse who received alert:  Micheline Chapman   MD notified (1st page):  Charlies Silvers  Time of first page:  95  MD notified (2nd page):Devine  Time of second page: 1400  Responding MD:  No response  Time MD responded:  none

## 2015-03-19 NOTE — Progress Notes (Signed)
Patient ID: Ricardo [name], male   DOB: 1935/07/21, 80 y.o.   MRN: [id number] [facility] PROGRESS NOTE  JUNIUS VIEN [id number] DOB: 10-06-1935 DOA: 03/14/2015 PCP: [name], DO  Brief narrative:    80 year old male with a past medical history bacterial meningitis with residual blindness, hearing loss and neurogenic bladder s/p suprapubic catheter who presented to the ED with confusion, problems with balance. The daughter stated that they had just gone to the doctor one day PTA for an infection of his right fifth toe. They had taken cultures there and had started him on Bactrim. Pt was febrile on admission with T max 101.6 F. His UA showed large leukocytes, many bacteria. Sodium was 125. He was started on rocephin while awaiting urine culture result.   Barrier to discharge: Ongoing hyponatremia.  Assessment/Plan:    Principal Problem: Acute encephalopathy / Proteus UTI in pt with suprapubic catheter - Altered mental status on the admission likely due to urinary tract infection - Resolved  - CT head without acute intracranial findings  - One of the cultures positive for proteus but repeat Ucx with multiple species none predominant - Continue Rocephin - Blood cultures to date are negative  Active Problems: Cellulitis of the right foot - Stopped bactrim 2/12 due to hyponatremia and started doxycyline but cellulitis improved actually completely resolved so we stopped doxycycline 2/13  Abnormal EKG - Initial EKG shows signs of a possible lateral infarct, seems to be recent - Troponins negative   Acute kidney injury - Secondary to UTI - Resolved with fluids   Hyponatremia - Probably from bactrim - Stopped bactrim 2/12 - Again 122 this morning. Check urine osmolarity, serum osmolarity and urine sodium.  - Stopped fluids today  Dyslipidemia - Continue statin therapy   Gait abnormality - PT eval done, no need for follow up   DVT Prophylaxis  - Lovenox subQ    Code Status: DNR/DNI  Family Communication:  plan of care discussed with the patient and his daughter at the bedside  Disposition Plan: home once sodium closer to 130  IV access:  Peripheral IV  Procedures and diagnostic studies:    Dg Chest 2 View 03/14/2015  1. Mild to moderate enlargement of the cardiopericardial silhouette, without edema. No pleural effusion. 2. Thoracic spondylosis.   Ct Head Wo Contrast 03/14/2015 No acute intracranial hemorrhage. Mild to moderate age-related atrophy and chronic microvascular ischemic disease. If symptoms persist and there are no contraindications, MRI may provide better evaluation if clinically indicated.  Dg Foot Complete Right 03/14/2015   No acute abnormality identified. Osteoarthritic changes of right foot.   Medical Consultants:  None   Other Consultants:  PT  IAnti-Infectives:   Rocephin 2/9 --> Bactrim day #4/7 --> 12/12 Doxycyline 12/12 --> 12/13   Leisa Lenz, MD  [facility] Pager [phone]  Time spent in minutes: 25 minutes  If 7PM-7AM, please contact night-coverage [email or web address] Password [facility] 03/19/2015, 12:14 PM   LOS: 4 days    HPI/Subjective: No acute overnight events. No nausea or vomiting.  Objective: Filed Vitals:   03/18/15 1320 03/18/15 2023 03/19/15 0555 03/19/15 1001  BP: 138/75 145/76 140/72   Pulse: 67 63 72   Temp: 98 F (36.7 C) 97.8 F (36.6 C) 98.2 F (36.8 C)   TempSrc: Oral Axillary Oral   Resp: 20 20 18    SpO2: 96% 96% 95% 97%    Intake/Output Summary (Last 24 hours) at 03/19/15 1214 Last data filed at 03/19/15 1150  Gross per  24 hour  Intake    962 ml  Output   4300 ml  Net  -3338 ml    Exam:   General:  Pt is alert, awake  Cardiovascular: Rate controlled, (+) S1, S2   Respiratory: bilateral air entry, no rhonchi   Abdomen: Nontender, nondistended  Extremities: No edema, pulses palpable  Neuro: No focal deficits  Data Reviewed: Basic Metabolic Panel:  Recent  Labs Lab 03/15/15 0239 03/16/15 0550 03/17/15 0602 03/18/15 0633 03/19/15 0554  NA 130* 127* 124* 122* 122*  K 4.1 3.7 3.9 3.8 3.8  CL 99* 96* 95* 92* 91*  CO2 21* 23 20* 21* 21*  GLUCOSE 109* 104* 94 98 126*  BUN 11 10 10 11 10   CREATININE 1.30* 1.21 1.02 0.97 0.78  CALCIUM 8.1* 7.9* 7.8* 8.0* 8.5*   Liver Function Tests:  Recent Labs Lab 03/14/15 1632  AST 23  ALT 16*  ALKPHOS 68  BILITOT 0.8  PROT 6.4*  ALBUMIN 3.7   No results for input(s): LIPASE, AMYLASE in the last 168 hours. No results for input(s): AMMONIA in the last 168 hours. CBC:  Recent Labs Lab 03/14/15 1632 03/15/15 0239 03/16/15 0550 03/17/15 0602  WBC 9.2 8.0 7.3 7.9  NEUTROABS 7.6  --   --   --   HGB 10.9* 11.6* 11.5* 11.2*  HCT 31.1* 35.0* 34.0* 31.9*  MCV 86.1 90.0 88.8 87.2  PLT 202 232 201 208   Cardiac Enzymes:  Recent Labs Lab 03/14/15 2117 03/15/15 0239 03/15/15 0849  TROPONINI <0.03 <0.03 <0.03   BNP: Invalid input(s): POCBNP CBG: No results for input(s): GLUCAP in the last 168 hours.  Recent Results (from the past 240 hour(s))  Aerobic Culture     Status: Abnormal   Collection Time: 03/13/15  2:55 PM  Result Value Ref Range Status   Aerobic Bacterial Culture Final report (A)  Final   Result 1 Proteus mirabilis (A)  Final    Comment: Heavy growth   ANTIMICROBIAL SUSCEPTIBILITY Comment  Final    Comment:       ** S = Susceptible; I = Intermediate; R = Resistant **                    P = Positive; N = Negative             MICS are expressed in micrograms per mL    Antibiotic                 RSLT#1    RSLT#2    RSLT#3    RSLT#4 Amoxicillin/Clavulanic Acid    S Ampicillin                     I Cefepime                       S Ceftriaxone                    S Cefuroxime                     S Ciprofloxacin                  R Ertapenem                      S Gentamicin  S Levofloxacin                   R Piperacillin                    S Tetracycline                   R Tobramycin                     S Trimethoprim/Sulfa             R   Blood culture (routine x 2)     Status: None (Preliminary result)   Collection Time: 03/14/15  4:32 PM  Result Value Ref Range Status   Specimen Description BLOOD RIGHT ANTECUBITAL  Final   Special Requests BOTTLES DRAWN AEROBIC AND ANAEROBIC [facility] EACH  Final   Culture   Final    NO GROWTH 4 DAYS Performed at [facility]    Report Status PENDING  Incomplete  Blood culture (routine x 2)     Status: None (Preliminary result)   Collection Time: 03/14/15  4:37 PM  Result Value Ref Range Status   Specimen Description BLOOD RIGHT HAND  Final   Special Requests BOTTLES DRAWN AEROBIC AND ANAEROBIC [place]  Final   Culture   Final    NO GROWTH 4 DAYS Performed at [facility]    Report Status PENDING  Incomplete  Urine culture     Status: None   Collection Time: 03/14/15  4:48 PM  Result Value Ref Range Status   Specimen Description URINE, CATHETERIZED  Final   Special Requests NONE  Final   Culture   Final    MULTIPLE SPECIES PRESENT, SUGGEST RECOLLECTION Performed at [facility]    Report Status 03/15/2015 FINAL  Final     Scheduled Meds: . atorvastatin  10 mg Oral q1800  . cefTRIAXone (ROCEPHIN)  IV  1 g Intravenous Q24H  . citalopram  20 mg Oral Daily  . docusate sodium  100 mg Oral BID  . enoxaparin (LOVENOX) injection  30 mg Subcutaneous Q24H  . lactobacillus acidophilus & bulgar  1 tablet Oral Daily  . losartan  25 mg Oral Daily  . pantoprazole  40 mg Oral Daily  . salmeterol  1 puff Inhalation BID  . silver sulfADIAZINE  1 application Topical Daily  . traZODone  50 mg Oral QHS

## 2015-03-20 DIAGNOSIS — I10 Essential (primary) hypertension: Secondary | ICD-10-CM

## 2015-03-20 DIAGNOSIS — N3000 Acute cystitis without hematuria: Secondary | ICD-10-CM

## 2015-03-20 DIAGNOSIS — N400 Enlarged prostate without lower urinary tract symptoms: Secondary | ICD-10-CM

## 2015-03-20 DIAGNOSIS — E871 Hypo-osmolality and hyponatremia: Secondary | ICD-10-CM

## 2015-03-20 DIAGNOSIS — H543 Unqualified visual loss, both eyes: Secondary | ICD-10-CM

## 2015-03-20 DIAGNOSIS — G9340 Encephalopathy, unspecified: Secondary | ICD-10-CM

## 2015-03-20 LAB — BASIC METABOLIC PANEL
ANION GAP: 10 (ref 5–15)
BUN: 10 mg/dL (ref 6–20)
CHLORIDE: 91 mmol/L — AB (ref 101–111)
CO2: 21 mmol/L — AB (ref 22–32)
CREATININE: 0.86 mg/dL (ref 0.61–1.24)
Calcium: 8.7 mg/dL — ABNORMAL LOW (ref 8.9–10.3)
GFR calc non Af Amer: >60 mL/min (ref >60)
Glucose, Bld: 100 mg/dL — ABNORMAL HIGH (ref 65–99)
Potassium: 4.2 mmol/L (ref 3.5–5.1)
Sodium: 122 mmol/L — ABNORMAL LOW (ref 135–145)

## 2015-03-20 LAB — SODIUM
SODIUM: 122 mmol/L — AB (ref 135–145)
Sodium: 121 mmol/L — ABNORMAL LOW (ref 135–145)
Sodium: 121 mmol/L — ABNORMAL LOW (ref 135–145)

## 2015-03-20 LAB — BRAIN NATRIURETIC PEPTIDE: B NATRIURETIC PEPTIDE 5: 34 pg/mL (ref 0.0–100.0)

## 2015-03-20 LAB — TSH: TSH: 1.148 u[IU]/mL (ref 0.350–4.500)

## 2015-03-20 MED ORDER — SODIUM CHLORIDE 1 G PO TABS
1.0000 g | ORAL_TABLET | Freq: Two times a day (BID) | ORAL | Status: DC
  Administered 2015-03-20: 1 g via ORAL
  Filled 2015-03-20 (×2): qty 1

## 2015-03-20 MED ORDER — SODIUM CHLORIDE 1 G PO TABS
1.0000 g | ORAL_TABLET | Freq: Three times a day (TID) | ORAL | Status: DC
  Administered 2015-03-20 – 2015-03-25 (×14): 1 g via ORAL
  Filled 2015-03-20 (×18): qty 1

## 2015-03-20 MED ORDER — CEPHALEXIN 500 MG PO CAPS
500.0000 mg | ORAL_CAPSULE | Freq: Four times a day (QID) | ORAL | Status: DC
  Administered 2015-03-20 – 2015-03-22 (×8): 500 mg via ORAL
  Filled 2015-03-20 (×12): qty 1

## 2015-03-20 MED ORDER — ENOXAPARIN SODIUM 40 MG/0.4ML ~~LOC~~ SOLN
40.0000 mg | SUBCUTANEOUS | Status: DC
  Administered 2015-03-20 – 2015-03-26 (×7): 40 mg via SUBCUTANEOUS
  Filled 2015-03-20 (×8): qty 0.4

## 2015-03-20 NOTE — Progress Notes (Signed)
[facility] Progress Note  Patient: Ricardo [name] [id number]   PCP: [name], DO DOB: 1935/03/07   DOA: 03/14/2015   DOS: 03/20/2015   Date of Service: the patient was seen and examined on 03/20/2015  Subjective: Patient denies any acute complaint. No nausea no vomiting or chest and abdominal pain. No shortness of breath. No cough Nutrition: Able to tolerate oral diet Activity: Bedridden mostly but works with physical therapy Last BM: 03/19/2015  Assessment and Plan: 1. Acute encephalopathy Probably UTI. On Rocephin with transition to oral Keflex. Mentation improved and currently at his baseline. Blood cultures negative.  2. Eczema of the right foot  Patient was initially started on Bactrim and then developed hyponatremia and it was stopped. Patient was placed on doxycycline but the redness improved and therefore it has been stopped as well. Currently do not see any edema or tenderness or redness. We will continue monitor.  3. Hyponatremia. Sodium load persistently 122. Stopping IV fluids. Osmolality suggest hypervolemia, we will fluid restrict.  4. Mild renal insufficiency. Currently improved  monitor with fluid restriction.  5. Essential hypertension. Continue losartan.  6. dyslipidemia. Continue Lipitor.  DVT Prophylaxis: subcutaneous Heparin Nutrition: Tolerating oral diet Advance goals of care discussion: DNR/DNI  Brief Summary of Hospitalization:  HPI: As per the H and P dictated on admission, "Mr. Fortuno is a 80 year old male with a past medical history bacterial meningitis with residual blindness, hearing loss, and neurogenic bladder s/p suprapubic catheter; who presents to the emergency department with confusion. The patient lives with his daughter and she is present and she helps provide additional history. She had noticed that he was somewhat confused and was having increased issues with his balance to the point in which he was unable to walk. She  notes that the difficulty in walking had actually started a few days ago, but was not as severe as today. He normally is very with it mentally, but notes that he forgot to take a bath after being told to and saying things that didn't make much sense. He is right-handed and denies any weakness, shortness of breath, or chest pain. The daughter states that they had just gone to the doctor yesterday for an infection of his right fifth toe. They had taken cultures there and had started him on Bactrim for which she only taken 2 doses. She also notes that he has been going back almost weekly to Dr. [name] for issues with his suprapubic catheter because of sediment causing the catheter to the point which is not able to be flushed. They last went to his office last Friday he reports that the catheter was changed at that time.  In route with EMS patient was noted to have a temperature 101.34F and he was given 1000 mg of Tylenol. Upon admission patient was evaluated with urinalysis which was positive for large leukocyte, nitrites, many bacteria, and no epithelial cells. WBC was 9.2, hemoglobin 10.9, sodium 125, chloride 94, bicarbonate 20.  " Daily update, Procedures: None Consultants: None Antibiotics: Anti-infectives    Start     Dose/Rate Route Frequency Ordered Stop   03/20/15 1200  cephALEXin (KEFLEX) capsule 500 mg     500 mg Oral 4 times per day 03/20/15 0932     03/17/15 1200  doxycycline (VIBRA-TABS) tablet 100 mg  Status:  Discontinued     100 mg Oral Every 12 hours 03/17/15 1035 03/18/15 1101   03/15/15 1800  cefTRIAXone (ROCEPHIN) 1 g in dextrose 5 % 50 mL  IVPB  Status:  Discontinued     1 g 100 mL/hr over 30 Minutes Intravenous Every 24 hours 03/14/15 2106 03/20/15 0932   03/15/15 1000  trimethoprim (TRIMPEX) tablet 100 mg  Status:  Discontinued     100 mg Oral  Every morning - 10a 03/14/15 2046 03/14/15 2105   03/14/15 2200  sulfamethoxazole-trimethoprim (BACTRIM DS,SEPTRA DS) 800-160 MG  per tablet 1 tablet  Status:  Discontinued     1 tablet Oral 2 times daily 03/14/15 2046 03/17/15 1035   03/14/15 2130  cefTRIAXone (ROCEPHIN) 1 g in dextrose 5 % 50 mL IVPB  Status:  Discontinued     1 g 100 mL/hr over 30 Minutes Intravenous Every 24 hours 03/14/15 2105 03/14/15 2106   03/14/15 1800  cefTRIAXone (ROCEPHIN) 1 g in dextrose 5 % 50 mL IVPB     1 g 100 mL/hr over 30 Minutes Intravenous  Once 03/14/15 1756 03/14/15 1933       Family Communication: No family was present at bedside, at the time of interview.   Disposition:  Expected discharge date: 03/21/2015 Barriers to safe discharge: Improvement in sodium level   Intake/Output Summary (Last 24 hours) at 03/20/15 1227 Last data filed at 03/20/15 1100  Gross per 24 hour  Intake 2272.5 ml  Output   2550 ml  Net -277.5 ml   Filed Weights   03/20/15 0500  Weight: 82.8 kg (182 lb 8.7 oz)    Objective: Physical Exam: Filed Vitals:   03/19/15 2238 03/20/15 0500 03/20/15 0502 03/20/15 0822  BP: 139/75  168/79   Pulse: 67  67 75  Temp: 97.6 F (36.4 C)  98.3 F (36.8 C)   TempSrc: Axillary  Oral   Resp: 18  20 20   Height:  5\' 10"  (1.778 m)    Weight:  82.8 kg (182 lb 8.7 oz)    SpO2: 95%  91% 96%    General: Appear in mild distress, no Rash; Oral Mucosa moist. Cardiovascular: S1 and S2 Present, no Murmur, no JVD Respiratory: Bilateral Air entry present and Clear to Auscultation, no Crackles, no wheezes Abdomen: Bowel Sound present, Soft and no tenderness Extremities: no Pedal edema, no calf tenderness Neurology: Grossly no focal neuro deficit.  Data Reviewed: CBC:  Recent Labs Lab 03/14/15 1632 03/15/15 0239 03/16/15 0550 03/17/15 0602  WBC 9.2 8.0 7.3 7.9  NEUTROABS 7.6  --   --   --   HGB 10.9* 11.6* 11.5* 11.2*  HCT 31.1* 35.0* 34.0* 31.9*  MCV 86.1 90.0 88.8 87.2  PLT 202 232 201 [id number]   Basic Metabolic Panel:  Recent Labs Lab 03/16/15 0550 03/17/15 0602 03/18/15 0633 03/19/15 0554  03/20/15 0856  NA 127* 124* 122* 122* 122*  K 3.7 3.9 3.8 3.8 4.2  CL 96* 95* 92* 91* 91*  CO2 23 20* 21* 21* 21*  GLUCOSE 104* 94 98 126* 100*  BUN 10 10 11 10 10   CREATININE 1.21 1.02 0.97 0.78 0.86  CALCIUM 7.9* 7.8* 8.0* 8.5* 8.7*   Liver Function Tests:  Recent Labs Lab 03/14/15 1632  AST 23  ALT 16*  ALKPHOS 68  BILITOT 0.8  PROT 6.4*  ALBUMIN 3.7   No results for input(s): LIPASE, AMYLASE in the last 168 hours. No results for input(s): AMMONIA in the last 168 hours.  Cardiac Enzymes:  Recent Labs Lab 03/14/15 2117 03/15/15 0239 03/15/15 0849  TROPONINI <0.03 <0.03 <0.03    BNP (last 3 results)  Recent Labs  03/20/15 0856  BNP 34.0    CBG: No results for input(s): GLUCAP in the last 168 hours.  Recent Results (from the past 240 hour(s))  Aerobic Culture     Status: Abnormal   Collection Time: 03/13/15  2:55 PM  Result Value Ref Range Status   Aerobic Bacterial Culture Final report (A)  Final   Result 1 Proteus mirabilis (A)  Final    Comment: Heavy growth   ANTIMICROBIAL SUSCEPTIBILITY Comment  Final    Comment:       ** S = Susceptible; I = Intermediate; R = Resistant **                    P = Positive; N = Negative             MICS are expressed in micrograms per mL    Antibiotic                 RSLT#1    RSLT#2    RSLT#3    RSLT#4 Amoxicillin/Clavulanic Acid    S Ampicillin                     I Cefepime                       S Ceftriaxone                    S Cefuroxime                     S Ciprofloxacin                  R Ertapenem                      S Gentamicin                     S Levofloxacin                   R Piperacillin                   S Tetracycline                   R Tobramycin                     S Trimethoprim/Sulfa             R   Blood culture (routine x 2)     Status: None   Collection Time: 03/14/15  4:32 PM  Result Value Ref Range Status   Specimen Description BLOOD RIGHT ANTECUBITAL  Final   Special  Requests BOTTLES DRAWN AEROBIC AND ANAEROBIC [facility] EACH  Final   Culture   Final    NO GROWTH 5 DAYS Performed at [facility]    Report Status 03/19/2015 FINAL  Final  Blood culture (routine x 2)     Status: None   Collection Time: 03/14/15  4:37 PM  Result Value Ref Range Status   Specimen Description BLOOD RIGHT HAND  Final   Special Requests BOTTLES DRAWN AEROBIC AND ANAEROBIC [place]  Final   Culture   Final    NO GROWTH 5 DAYS Performed at [facility]    Report Status 03/19/2015 FINAL  Final  Urine culture     Status: None   Collection Time: 03/14/15  4:48  PM  Result Value Ref Range Status   Specimen Description URINE, CATHETERIZED  Final   Special Requests NONE  Final   Culture   Final    MULTIPLE SPECIES PRESENT, SUGGEST RECOLLECTION Performed at [facility]    Report Status 03/15/2015 FINAL  Final     Studies: No results found.   Scheduled Meds: . atorvastatin  10 mg Oral q1800  . cephALEXin  500 mg Oral 4 times per day  . citalopram  20 mg Oral Daily  . docusate sodium  100 mg Oral BID  . enoxaparin (LOVENOX) injection  30 mg Subcutaneous Q24H  . lactobacillus acidophilus & bulgar  1 tablet Oral Daily  . losartan  25 mg Oral Daily  . pantoprazole  40 mg Oral Daily  . salmeterol  1 puff Inhalation BID  . silver sulfADIAZINE  1 application Topical Daily  . traZODone  50 mg Oral QHS   Continuous Infusions:  PRN Meds: acetaminophen **OR** acetaminophen, albuterol, ALPRAZolam, HYDROcodone-acetaminophen, magnesium hydroxide, ondansetron **OR** ondansetron (ZOFRAN) IV  Time spent: 30 minutes  Author: Berle Mull, MD [facility] Pager: [phone] 03/20/2015 12:27 PM  If 7PM-7AM, please contact night-coverage at [email or web address], password [facility]

## 2015-03-21 DIAGNOSIS — J438 Other emphysema: Secondary | ICD-10-CM

## 2015-03-21 DIAGNOSIS — N390 Urinary tract infection, site not specified: Secondary | ICD-10-CM

## 2015-03-21 DIAGNOSIS — B964 Proteus (mirabilis) (morganii) as the cause of diseases classified elsewhere: Secondary | ICD-10-CM

## 2015-03-21 DIAGNOSIS — R269 Unspecified abnormalities of gait and mobility: Secondary | ICD-10-CM

## 2015-03-21 LAB — CBC WITH DIFFERENTIAL/PLATELET
BASOS ABS: 0 10*3/uL (ref 0.0–0.1)
Basophils Relative: 1 %
EOS ABS: 0.5 10*3/uL (ref 0.0–0.7)
EOS PCT: 6 %
HCT: 39.1 % (ref 39.0–52.0)
Hemoglobin: 13.5 g/dL (ref 13.0–17.0)
LYMPHS ABS: 2.1 10*3/uL (ref 0.7–4.0)
LYMPHS PCT: 26 %
MCH: 29.7 pg (ref 26.0–34.0)
MCHC: 34.5 g/dL (ref 30.0–36.0)
MCV: 86.1 fL (ref 78.0–100.0)
MONO ABS: 1.4 10*3/uL — AB (ref 0.1–1.0)
Monocytes Relative: 17 %
NEUTROS ABS: 4.2 10*3/uL (ref 1.7–7.7)
Neutrophils Relative %: 50 %
PLATELETS: 334 10*3/uL (ref 150–400)
RBC: 4.54 MIL/uL (ref 4.22–5.81)
RDW: 13.1 % (ref 11.5–15.5)
WBC: 8.3 10*3/uL (ref 4.0–10.5)

## 2015-03-21 LAB — SODIUM
SODIUM: 121 mmol/L — AB (ref 135–145)
Sodium: 118 mmol/L — CL (ref 135–145)
Sodium: 120 mmol/L — ABNORMAL LOW (ref 135–145)
Sodium: 124 mmol/L — ABNORMAL LOW (ref 135–145)
Sodium: 125 mmol/L — ABNORMAL LOW (ref 135–145)

## 2015-03-21 LAB — COMPREHENSIVE METABOLIC PANEL
ALT: 66 U/L — AB (ref 17–63)
AST: 34 U/L (ref 15–41)
Albumin: 3.7 g/dL (ref 3.5–5.0)
Alkaline Phosphatase: 147 U/L — ABNORMAL HIGH (ref 38–126)
Anion gap: 10 (ref 5–15)
BUN: 14 mg/dL (ref 6–20)
CHLORIDE: 89 mmol/L — AB (ref 101–111)
CO2: 22 mmol/L (ref 22–32)
CREATININE: 0.86 mg/dL (ref 0.61–1.24)
Calcium: 8.5 mg/dL — ABNORMAL LOW (ref 8.9–10.3)
GFR calc Af Amer: >60 mL/min (ref >60)
GFR calc non Af Amer: >60 mL/min (ref >60)
Glucose, Bld: 116 mg/dL — ABNORMAL HIGH (ref 65–99)
Potassium: 4.1 mmol/L (ref 3.5–5.1)
SODIUM: 121 mmol/L — AB (ref 135–145)
Total Bilirubin: 0.9 mg/dL (ref 0.3–1.2)
Total Protein: 6.7 g/dL (ref 6.5–8.1)

## 2015-03-21 LAB — CORTISOL: Cortisol, Plasma: 21.5 ug/dL

## 2015-03-21 MED ORDER — ZOLPIDEM TARTRATE 5 MG PO TABS
5.0000 mg | ORAL_TABLET | Freq: Every evening | ORAL | Status: DC | PRN
  Administered 2015-03-21 – 2015-03-26 (×6): 5 mg via ORAL
  Filled 2015-03-21 (×6): qty 1

## 2015-03-21 MED ORDER — ALPRAZOLAM 0.25 MG PO TABS
0.2500 mg | ORAL_TABLET | Freq: Two times a day (BID) | ORAL | Status: DC | PRN
  Administered 2015-03-22 – 2015-03-26 (×5): 0.25 mg via ORAL
  Filled 2015-03-21 (×5): qty 1

## 2015-03-21 NOTE — Progress Notes (Signed)
OT Cancellation Note  Patient Details Name: Ricardo [name] MRN: [id number] DOB: [birth year]   Cancelled Treatment:    Reason Eval/Treat Not Completed: Other (comment) OT checked on pt. Pt sleeping soundly.  Daughter not present.  Feel OT best with daughter present to address ADL concerns.  Will check on pt next day. Betsy Pries 03/21/2015, 12:29 PM

## 2015-03-21 NOTE — Progress Notes (Signed)
Physical Therapy Treatment Patient Details Name: Ricardo [name] MRN: [id number] DOB: 1935-04-18 Today's Date: 03/21/2015    History of Present Illness 80 yo male admitted with acute encephalopathy, UTI. Hx of bacterial meningitis, blindness, hearing loss, neurogenic bladder, R foot cellulitis    PT Comments    Upon entering room, pt stated the battery for his cochlear implant had died and that he couldn't hear and therefore felt anxious. I spoke to pt's daughter by phone who gave instructions in changing the battery. Mod A for bed mobility. Pt ambulated 70' with RW and min A to maneuver RW.   Follow Up Recommendations  No PT follow up;Supervision/Assistance - 24 hour (daughter declines HHPT follow up)     Equipment Recommendations  None recommended by PT    Recommendations for Other Services       Precautions / Restrictions Precautions Precautions: Fall Precaution Comments: high fall risk, blind, HOH (cochlear implant L ear) Restrictions Weight Bearing Restrictions: No    Mobility  Bed Mobility Overal bed mobility: Needs Assistance Bed Mobility: Supine to Sit     Supine to sit: HOB elevated;Mod assist     General bed mobility comments: Mod A to raise trunk and to scoot forward to EOB  Transfers Overall transfer level: Needs assistance Equipment used: Rolling walker (2 wheeled) Transfers: Sit to/from Stand Sit to Stand: +2 safety/equipment;From elevated surface;Mod assist         General transfer comment: Assist to rise, stabilize, control descent. Multimodal cues for safety, hand placement.   Ambulation/Gait Ambulation/Gait assistance: Min assist;+2 safety/equipment Ambulation Distance (Feet): 75 Feet Assistive device: Rolling walker (2 wheeled) Gait Pattern/deviations: Decreased step length - right;Decreased step length - left   Gait velocity interpretation: Below normal speed for age/gender General Gait Details: Assist for stability pt and to maneuver  safely with walker. VCs to increase step length. Distance limited by fatigue.   Stairs            Wheelchair Mobility    Modified Rankin (Stroke Patients Only)       Balance   Sitting-balance support: Feet supported Sitting balance-Leahy Scale: Fair Sitting balance - Comments: tends to lean posteriorly but able to maintain balance Postural control: Posterior lean   Standing balance-Leahy Scale: Poor                      Cognition Arousal/Alertness: Awake/alert Behavior During Therapy: WFL for tasks assessed/performed Overall Cognitive Status: Within Functional Limits for tasks assessed                      Exercises      General Comments        Pertinent Vitals/Pain Pain Assessment: No/denies pain    Home Living                      Prior Function            PT Goals (current goals can now be found in the care plan section) Acute Rehab PT Goals Patient Stated Goal: to get better.  PT Goal Formulation: With patient Time For Goal Achievement: 03/30/15 Potential to Achieve Goals: Fair Progress towards PT goals: Progressing toward goals    Frequency  Min 3X/week    PT Plan Current plan remains appropriate    Co-evaluation             End of Session Equipment Utilized During Treatment: Gait belt Activity Tolerance: Patient tolerated  treatment well Patient left: in chair;with call bell/phone within reach;with chair alarm set;with nursing/sitter in room     Time: [id number] PT Time Calculation (min) (ACUTE ONLY): 29 min  Charges:  $Gait Training: 8-22 mins $Therapeutic Activity: 8-22 mins                    G Codes:      Philomena Doheny 03/21/2015, 2:34 PM [phone]

## 2015-03-21 NOTE — Progress Notes (Signed)
[facility] Progress Note  Patient: Ricardo [name] [id number]   PCP: [name], DO DOB: December 08, 1935   DOA: 03/14/2015   DOS: 03/21/2015   Date of Service: the patient was seen and examined on 03/21/2015  Subjective: Patient continues to deny acute complain. No nausea no vomiting. Nutrition: Able to tolerate oral diet Activity: Bedridden mostly but works with physical therapy Last BM: 03/19/2015  Assessment and Plan: 1. Acute encephalopathy Probably UTI. On Rocephin with transition to oral Keflex. Mentation improved and currently at his baseline. Blood cultures negative.  2. Eczema of the right foot  Patient was initially started on Bactrim and then developed hyponatremia and it was stopped. Patient was placed on doxycycline but the redness improved and therefore it has been stopped as well. Currently do not see any edema or tenderness or redness. We will continue monitor.  3. Hyponatremia. Sodium improving 124. Low serum osmolality, suggest hypervolemia, or SIADH probably from medication. Currently his Celexa and trazodone has been discontinued. Patient on fluid restriction 1200 mL. Salt tablet 3 times a day, regular diet. Interestingly the patient is -14 L since admission which should cause the patient to have significant volume concentration. We will continue to monitor ins and outs. Briefly discussed with nephrology recommend continue current management at present  4. Mild renal insufficiency. Currently improved  monitor with fluid restriction.  5. Essential hypertension. Continue losartan.  6. dyslipidemia. Continue Lipitor.  DVT Prophylaxis: subcutaneous Heparin Nutrition: Tolerating oral diet Advance goals of care discussion: DNR/DNI  Brief Summary of Hospitalization:  HPI: As per the H and P dictated on admission, "[name] is a 80 year old male with a past medical history bacterial meningitis with residual blindness, hearing loss, and neurogenic  bladder s/p suprapubic catheter; who presents to the emergency department with confusion. The patient lives with his daughter and she is present and she helps provide additional history. She had noticed that he was somewhat confused and was having increased issues with his balance to the point in which he was unable to walk. She notes that the difficulty in walking had actually started a few days ago, but was not as severe as today. He normally is very with it mentally, but notes that he forgot to take a bath after being told to and saying things that didn't make much sense. He is right-handed and denies any weakness, shortness of breath, or chest pain. The daughter states that they had just gone to the doctor yesterday for an infection of his right fifth toe. They had taken cultures there and had started him on Bactrim for which she only taken 2 doses. She also notes that he has been going back almost weekly to Dr. [name] for issues with his suprapubic catheter because of sediment causing the catheter to the point which is not able to be flushed. They last went to his office last Friday he reports that the catheter was changed at that time.  In route with EMS patient was noted to have a temperature 101.73F and he was given 1000 mg of Tylenol. Upon admission patient was evaluated with urinalysis which was positive for large leukocyte, nitrites, many bacteria, and no epithelial cells. WBC was 9.2, hemoglobin 10.9, sodium 125, chloride 94, bicarbonate 20." Daily update, Procedures: None Consultants: None Antibiotics: Anti-infectives    Start     Dose/Rate Route Frequency Ordered Stop   03/20/15 1200  cephALEXin (KEFLEX) capsule 500 mg     500 mg Oral 4 times per day 03/20/15 0932  03/17/15 1200  doxycycline (VIBRA-TABS) tablet 100 mg  Status:  Discontinued     100 mg Oral Every 12 hours 03/17/15 1035 03/18/15 1101   03/15/15 1800  cefTRIAXone (ROCEPHIN) 1 g in dextrose 5 % 50 mL IVPB  Status:   Discontinued     1 g 100 mL/hr over 30 Minutes Intravenous Every 24 hours 03/14/15 2106 03/20/15 0932   03/15/15 1000  trimethoprim (TRIMPEX) tablet 100 mg  Status:  Discontinued     100 mg Oral  Every morning - 10a 03/14/15 2046 03/14/15 2105   03/14/15 2200  sulfamethoxazole-trimethoprim (BACTRIM DS,SEPTRA DS) 800-160 MG per tablet 1 tablet  Status:  Discontinued     1 tablet Oral 2 times daily 03/14/15 2046 03/17/15 1035   03/14/15 2130  cefTRIAXone (ROCEPHIN) 1 g in dextrose 5 % 50 mL IVPB  Status:  Discontinued     1 g 100 mL/hr over 30 Minutes Intravenous Every 24 hours 03/14/15 2105 03/14/15 2106   03/14/15 1800  cefTRIAXone (ROCEPHIN) 1 g in dextrose 5 % 50 mL IVPB     1 g 100 mL/hr over 30 Minutes Intravenous  Once 03/14/15 1756 03/14/15 1933       Family Communication: No family was present at bedside, at the time of interview.   Disposition:  Expected discharge date: 03/21/2015 Barriers to safe discharge: Improvement in sodium level   Intake/Output Summary (Last 24 hours) at 03/21/15 1851 Last data filed at 03/21/15 1837  Gross per 24 hour  Intake    230 ml  Output   1900 ml  Net  -1670 ml   Filed Weights   03/20/15 0500  Weight: 82.8 kg (182 lb 8.7 oz)    Objective: Physical Exam: Filed Vitals:   03/20/15 2059 03/21/15 0423 03/21/15 0743 03/21/15 1403  BP: 150/77 170/67  152/86  Pulse: 73 77  81  Temp: 97.9 F (36.6 C) 97.9 F (36.6 C)  97.3 F (36.3 C)  TempSrc: Oral Axillary  Axillary  Resp: 22 20  20   Height:      Weight:      SpO2: 93% 95% 95% 95%    General: Appear in mild distress, no Rash; Oral Mucosa moist. Cardiovascular: S1 and S2 Present, no Murmur, no JVD Respiratory: Bilateral Air entry present and Clear to Auscultation, no Crackles, no wheezes Abdomen: Bowel Sound present, Soft and no tenderness Extremities: no Pedal edema, no calf tenderness Neurology: Grossly no focal neuro deficit.  Data Reviewed: CBC:  Recent Labs Lab  03/15/15 0239 03/16/15 0550 03/17/15 0602 03/21/15 0410  WBC 8.0 7.3 7.9 8.3  NEUTROABS  --   --   --  4.2  HGB 11.6* 11.5* 11.2* 13.5  HCT 35.0* 34.0* 31.9* 39.1  MCV 90.0 88.8 87.2 86.1  PLT 232 201 208 [id number]   Basic Metabolic Panel:  Recent Labs Lab 03/17/15 0602 03/18/15 0633 03/19/15 0554 03/20/15 0856  03/21/15 0132 03/21/15 0410 03/21/15 0810 03/21/15 1140 03/21/15 1606  NA 124* 122* 122* 122*  < > 118* 121* 121* 120* 124*  K 3.9 3.8 3.8 4.2  --   --  4.1  --   --   --   CL 95* 92* 91* 91*  --   --  89*  --   --   --   CO2 20* 21* 21* 21*  --   --  22  --   --   --   GLUCOSE 94 98 126* 100*  --   --  116*  --   --   --   BUN 10 11 10 10   --   --  14  --   --   --   CREATININE 1.02 0.97 0.78 0.86  --   --  0.86  --   --   --   CALCIUM 7.8* 8.0* 8.5* 8.7*  --   --  8.5*  --   --   --   < > = values in this interval not displayed. Liver Function Tests:  Recent Labs Lab 03/21/15 0410  AST 34  ALT 66*  ALKPHOS 147*  BILITOT 0.9  PROT 6.7  ALBUMIN 3.7   No results for input(s): LIPASE, AMYLASE in the last 168 hours. No results for input(s): AMMONIA in the last 168 hours.  Cardiac Enzymes:  Recent Labs Lab 03/14/15 2117 03/15/15 0239 03/15/15 0849  TROPONINI <0.03 <0.03 <0.03    BNP (last 3 results)  Recent Labs  03/20/15 0856  BNP 34.0    CBG: No results for input(s): GLUCAP in the last 168 hours.  Recent Results (from the past 240 hour(s))  Aerobic Culture     Status: Abnormal   Collection Time: 03/13/15  2:55 PM  Result Value Ref Range Status   Aerobic Bacterial Culture Final report (A)  Final   Result 1 Proteus mirabilis (A)  Final    Comment: Heavy growth   ANTIMICROBIAL SUSCEPTIBILITY Comment  Final    Comment:       ** S = Susceptible; I = Intermediate; R = Resistant **                    P = Positive; N = Negative             MICS are expressed in micrograms per mL    Antibiotic                 RSLT#1    RSLT#2    RSLT#3     RSLT#4 Amoxicillin/Clavulanic Acid    S Ampicillin                     I Cefepime                       S Ceftriaxone                    S Cefuroxime                     S Ciprofloxacin                  R Ertapenem                      S Gentamicin                     S Levofloxacin                   R Piperacillin                   S Tetracycline                   R Tobramycin                     S Trimethoprim/Sulfa  R   Blood culture (routine x 2)     Status: None   Collection Time: 03/14/15  4:32 PM  Result Value Ref Range Status   Specimen Description BLOOD RIGHT ANTECUBITAL  Final   Special Requests BOTTLES DRAWN AEROBIC AND ANAEROBIC [place]  Final   Culture   Final    NO GROWTH 5 DAYS Performed at [facility]    Report Status 03/19/2015 FINAL  Final  Blood culture (routine x 2)     Status: None   Collection Time: 03/14/15  4:37 PM  Result Value Ref Range Status   Specimen Description BLOOD RIGHT HAND  Final   Special Requests BOTTLES DRAWN AEROBIC AND ANAEROBIC [place]  Final   Culture   Final    NO GROWTH 5 DAYS Performed at [facility]    Report Status 03/19/2015 FINAL  Final  Urine culture     Status: None   Collection Time: 03/14/15  4:48 PM  Result Value Ref Range Status   Specimen Description URINE, CATHETERIZED  Final   Special Requests NONE  Final   Culture   Final    MULTIPLE SPECIES PRESENT, SUGGEST RECOLLECTION Performed at [facility]    Report Status 03/15/2015 FINAL  Final     Studies: No results found.   Scheduled Meds: . atorvastatin  10 mg Oral q1800  . cephALEXin  500 mg Oral 4 times per day  . docusate sodium  100 mg Oral BID  . enoxaparin (LOVENOX) injection  40 mg Subcutaneous Q24H  . lactobacillus acidophilus & bulgar  1 tablet Oral Daily  . losartan  25 mg Oral Daily  . pantoprazole  40 mg Oral Daily  . salmeterol  1 puff Inhalation BID  . silver sulfADIAZINE  1 application Topical Daily  .  sodium chloride  1 g Oral TID WC   Continuous Infusions:  PRN Meds: acetaminophen **OR** acetaminophen, albuterol, ALPRAZolam, HYDROcodone-acetaminophen, ondansetron **OR** ondansetron (ZOFRAN) IV, zolpidem  Time spent: 30 minutes  Author: Berle Mull, MD [facility] Pager: [phone] 03/21/2015 6:51 PM  If 7PM-7AM, please contact night-coverage at [email or web address], password [facility]

## 2015-03-21 NOTE — Progress Notes (Signed)
Date: March 21, 2015 Chart reviewed for concurrent status and case management needs. Will continue to follow patient for changes and needs: Miko Markwood, BSN, RN, CCM   [phone] 

## 2015-03-21 NOTE — Progress Notes (Signed)
CRITICAL VALUE ALERT  Critical value received:  Sodium 118  Date of notification:  2/16  Time of notification:  0200  Critical value read back:Yes.    Nurse who received alert:  c Evalynn Hankins rn  MD notified (1st page):  schorr  Time of first page:  0202  MD notified (2nd page):  Time of second page:  Responding MD:    Time MD responded:

## 2015-03-22 ENCOUNTER — Ambulatory Visit: Payer: Medicare HMO | Admitting: Internal Medicine

## 2015-03-22 LAB — BASIC METABOLIC PANEL
Anion gap: 9 (ref 5–15)
BUN: 15 mg/dL (ref 6–20)
CO2: 22 mmol/L (ref 22–32)
CREATININE: 0.77 mg/dL (ref 0.61–1.24)
Calcium: 8.7 mg/dL — ABNORMAL LOW (ref 8.9–10.3)
Chloride: 91 mmol/L — ABNORMAL LOW (ref 101–111)
Glucose, Bld: 108 mg/dL — ABNORMAL HIGH (ref 65–99)
POTASSIUM: 4.1 mmol/L (ref 3.5–5.1)
SODIUM: 122 mmol/L — AB (ref 135–145)

## 2015-03-22 LAB — OSMOLALITY: Osmolality: 263 mOsm/kg — ABNORMAL LOW (ref 275–295)

## 2015-03-22 LAB — URIC ACID: Uric Acid, Serum: 3.2 mg/dL — ABNORMAL LOW (ref 4.4–7.6)

## 2015-03-22 LAB — MAGNESIUM: MAGNESIUM: 1.9 mg/dL (ref 1.7–2.4)

## 2015-03-22 LAB — SODIUM
SODIUM: 123 mmol/L — AB (ref 135–145)
SODIUM: 123 mmol/L — AB (ref 135–145)
Sodium: 123 mmol/L — ABNORMAL LOW (ref 135–145)
Sodium: 123 mmol/L — ABNORMAL LOW (ref 135–145)
Sodium: 125 mmol/L — ABNORMAL LOW (ref 135–145)

## 2015-03-22 MED ORDER — POLYETHYLENE GLYCOL 3350 17 G PO PACK
17.0000 g | PACK | Freq: Every day | ORAL | Status: DC
  Administered 2015-03-25 – 2015-03-27 (×3): 17 g via ORAL
  Filled 2015-03-22 (×5): qty 1

## 2015-03-22 MED ORDER — SENNOSIDES-DOCUSATE SODIUM 8.6-50 MG PO TABS
1.0000 | ORAL_TABLET | Freq: Two times a day (BID) | ORAL | Status: DC
  Administered 2015-03-22 – 2015-03-27 (×9): 1 via ORAL
  Filled 2015-03-22 (×12): qty 1

## 2015-03-22 NOTE — Progress Notes (Signed)
[facility] Progress Note  Patient: Ricardo [name] [id number]   PCP: Ricardo Cranker, DO DOB: 03-09-1935   DOA: 03/14/2015   DOS: 03/22/2015   Date of Service: the patient was seen and examined on 03/22/2015  Subjective: Patient is more awake and oriented. Denies having any acute complaint. No nausea no vomiting. Nutrition: Able to tolerate oral diet Activity: Bedridden mostly but works with physical therapy Last BM: 03/19/2015  Assessment and Plan: 1. Acute encephalopathy Probably UTI. On Rocephin with transition to oral Keflex. Completed 7 day treatment. We will stop the antibiotics. Mentation improved and currently at his baseline. Blood cultures negative.  2. Eczema of the right foot  Patient was initially started on Bactrim and then developed hyponatremia and it was stopped. Patient was placed on doxycycline but the redness improved and therefore it has been stopped as well. Currently do not see any edema or tenderness or redness. We will continue monitor.  3. Hyponatremia. Sodium remaining stable 123. Patient is asymptomatic. Discussed with nephrology on the phone, recommended to continue current management.  We will recheck osmolality as well as urine sodium Currently his Celexa and trazodone has been discontinued. Patient on fluid restriction 1200 mL. Salt tablet 3 times a day, regular diet. Interestingly the patient is -15 L since admission, does not have any evidence of dehydration, orthostatic are negative other than increased heart rate.  4. Mild renal insufficiency. Currently improved  monitor with fluid restriction.  5. Essential hypertension. Continue losartan.  6. dyslipidemia. Continue Lipitor.  DVT Prophylaxis: subcutaneous Heparin Nutrition: Regular diet Advance goals of care discussion: DNR/DNI  Brief Summary of Hospitalization:  HPI: As per the H and P dictated on admission, "[name] is a 80 year old male with a past medical history  bacterial meningitis with residual blindness, hearing loss, and neurogenic bladder s/p suprapubic catheter; who presents to the emergency department with confusion. The patient lives with his daughter and she is present and she helps provide additional history. She had noticed that he was somewhat confused and was having increased issues with his balance to the point in which he was unable to walk. She notes that the difficulty in walking had actually started a few days ago, but was not as severe as today. He normally is very with it mentally, but notes that he forgot to take a bath after being told to and saying things that didn't make much sense. He is right-handed and denies any weakness, shortness of breath, or chest pain. The daughter states that they had just gone to the doctor yesterday for an infection of his right fifth toe. They had taken cultures there and had started him on Bactrim for which she only taken 2 doses. She also notes that he has been going back almost weekly to Dr. Gaynelle [name] for issues with his suprapubic catheter because of sediment causing the catheter to the point which is not able to be flushed. They last went to his office last Friday he reports that the catheter was changed at that time.  In route with EMS patient was noted to have a temperature 101.13F and he was given 1000 mg of Tylenol. Upon admission patient was evaluated with urinalysis which was positive for large leukocyte, nitrites, many bacteria, and no epithelial cells. WBC was 9.2, hemoglobin 10.9, sodium 125, chloride 94, bicarbonate 20." Daily update, Procedures: None Consultants: None Antibiotics: Anti-infectives    Start     Dose/Rate Route Frequency Ordered Stop   03/20/15 1200  cephALEXin (KEFLEX) capsule  500 mg  Status:  Discontinued     500 mg Oral 4 times per day 03/20/15 0932 03/22/15 1100   03/17/15 1200  doxycycline (VIBRA-TABS) tablet 100 mg  Status:  Discontinued     100 mg Oral Every 12 hours  03/17/15 1035 03/18/15 1101   03/15/15 1800  cefTRIAXone (ROCEPHIN) 1 g in dextrose 5 % 50 mL IVPB  Status:  Discontinued     1 g 100 mL/hr over 30 Minutes Intravenous Every 24 hours 03/14/15 2106 03/20/15 0932   03/15/15 1000  trimethoprim (TRIMPEX) tablet 100 mg  Status:  Discontinued     100 mg Oral  Every morning - 10a 03/14/15 2046 03/14/15 2105   03/14/15 2200  sulfamethoxazole-trimethoprim (BACTRIM DS,SEPTRA DS) 800-160 MG per tablet 1 tablet  Status:  Discontinued     1 tablet Oral 2 times daily 03/14/15 2046 03/17/15 1035   03/14/15 2130  cefTRIAXone (ROCEPHIN) 1 g in dextrose 5 % 50 mL IVPB  Status:  Discontinued     1 g 100 mL/hr over 30 Minutes Intravenous Every 24 hours 03/14/15 2105 03/14/15 2106   03/14/15 1800  cefTRIAXone (ROCEPHIN) 1 g in dextrose 5 % 50 mL IVPB     1 g 100 mL/hr over 30 Minutes Intravenous  Once 03/14/15 1756 03/14/15 1933      Family Communication: No family was present at bedside, at the time of interview.   Disposition:  Expected discharge date: 03/25/2015 Barriers to safe discharge: Improvement in sodium level   Intake/Output Summary (Last 24 hours) at 03/22/15 1550 Last data filed at 03/22/15 1351  Gross per 24 hour  Intake    660 ml  Output   1450 ml  Net   -790 ml   Filed Weights   03/20/15 0500  Weight: 82.8 kg (182 lb 8.7 oz)    Objective: Physical Exam: Filed Vitals:   03/21/15 2111 03/22/15 0433 03/22/15 0904 03/22/15 1422  BP: 144/71 134/77  128/57  Pulse: 75 84  89  Temp: 97.7 F (36.5 C) 97.8 F (36.6 C)  97.5 F (36.4 C)  TempSrc: Oral Oral  Oral  Resp: 20 22  18   Height:      Weight:      SpO2: 97% 93% 96% 96%    General: Appear in mild distress, no Rash; Oral Mucosa moist. Cardiovascular: S1 and S2 Present, no Murmur, no JVD Respiratory: Bilateral Air entry present and Clear to Auscultation, no Crackles, no wheezes Abdomen: Bowel Sound present, Soft and no tenderness Extremities: no Pedal edema, no calf  tenderness Neurology: Grossly no focal neuro deficit.  Data Reviewed: CBC:  Recent Labs Lab 03/16/15 0550 03/17/15 0602 03/21/15 0410  WBC 7.3 7.9 8.3  NEUTROABS  --   --  4.2  HGB 11.5* 11.2* 13.5  HCT 34.0* 31.9* 39.1  MCV 88.8 87.2 86.1  PLT 201 208 [id number]   Basic Metabolic Panel:  Recent Labs Lab 03/18/15 0633 03/19/15 0554 03/20/15 0856  03/21/15 0410  03/21/15 1950 03/21/15 2344 03/22/15 0442 03/22/15 0752 03/22/15 1153  NA 122* 122* 122*  < > 121*  < > 125* 123* 122* 123* 123*  K 3.8 3.8 4.2  --  4.1  --   --   --  4.1  --   --   CL 92* 91* 91*  --  89*  --   --   --  91*  --   --   CO2 21* 21* 21*  --  22  --   --   --  22  --   --   GLUCOSE 98 126* 100*  --  116*  --   --   --  108*  --   --   BUN 11 10 10   --  14  --   --   --  15  --   --   CREATININE 0.97 0.78 0.86  --  0.86  --   --   --  0.77  --   --   CALCIUM 8.0* 8.5* 8.7*  --  8.5*  --   --   --  8.7*  --   --   MG  --   --   --   --   --   --   --   --  1.9  --   --   < > = values in this interval not displayed. Liver Function Tests:  Recent Labs Lab 03/21/15 0410  AST 34  ALT 66*  ALKPHOS 147*  BILITOT 0.9  PROT 6.7  ALBUMIN 3.7   No results for input(s): LIPASE, AMYLASE in the last 168 hours. No results for input(s): AMMONIA in the last 168 hours.  Cardiac Enzymes: No results for input(s): CKTOTAL, CKMB, CKMBINDEX, TROPONINI in the last 168 hours.  BNP (last 3 results)  Recent Labs  03/20/15 0856  BNP 34.0    CBG: No results for input(s): GLUCAP in the last 168 hours.  Recent Results (from the past 240 hour(s))  Aerobic Culture     Status: Abnormal   Collection Time: 03/13/15  2:55 PM  Result Value Ref Range Status   Aerobic Bacterial Culture Final report (A)  Final   Result 1 Proteus mirabilis (A)  Final    Comment: Heavy growth   ANTIMICROBIAL SUSCEPTIBILITY Comment  Final    Comment:       ** S = Susceptible; I = Intermediate; R = Resistant **                    P =  Positive; N = Negative             MICS are expressed in micrograms per mL    Antibiotic                 RSLT#1    RSLT#2    RSLT#3    RSLT#4 Amoxicillin/Clavulanic Acid    S Ampicillin                     I Cefepime                       S Ceftriaxone                    S Cefuroxime                     S Ciprofloxacin                  R Ertapenem                      S Gentamicin                     S Levofloxacin                   R Piperacillin  S Tetracycline                   R Tobramycin                     S Trimethoprim/Sulfa             R   Blood culture (routine x 2)     Status: None   Collection Time: 03/14/15  4:32 PM  Result Value Ref Range Status   Specimen Description BLOOD RIGHT ANTECUBITAL  Final   Special Requests BOTTLES DRAWN AEROBIC AND ANAEROBIC [place]  Final   Culture   Final    NO GROWTH 5 DAYS Performed at [facility]    Report Status 03/19/2015 FINAL  Final  Blood culture (routine x 2)     Status: None   Collection Time: 03/14/15  4:37 PM  Result Value Ref Range Status   Specimen Description BLOOD RIGHT HAND  Final   Special Requests BOTTLES DRAWN AEROBIC AND ANAEROBIC [place]  Final   Culture   Final    NO GROWTH 5 DAYS Performed at [facility]    Report Status 03/19/2015 FINAL  Final  Urine culture     Status: None   Collection Time: 03/14/15  4:48 PM  Result Value Ref Range Status   Specimen Description URINE, CATHETERIZED  Final   Special Requests NONE  Final   Culture   Final    MULTIPLE SPECIES PRESENT, SUGGEST RECOLLECTION Performed at [facility]    Report Status 03/15/2015 FINAL  Final     Studies: No results found.   Scheduled Meds: . atorvastatin  10 mg Oral q1800  . enoxaparin (LOVENOX) injection  40 mg Subcutaneous Q24H  . lactobacillus acidophilus & bulgar  1 tablet Oral Daily  . losartan  25 mg Oral Daily  . pantoprazole  40 mg Oral Daily  . polyethylene glycol  17 g Oral  Daily  . salmeterol  1 puff Inhalation BID  . senna-docusate  1 tablet Oral BID  . silver sulfADIAZINE  1 application Topical Daily  . sodium chloride  1 g Oral TID WC   Continuous Infusions:  PRN Meds: acetaminophen **OR** acetaminophen, albuterol, ALPRAZolam, HYDROcodone-acetaminophen, ondansetron **OR** ondansetron (ZOFRAN) IV, zolpidem  Time spent: 30 minutes  Author: Berle Mull, MD [facility] Pager: [phone] 03/22/2015 3:50 PM  If 7PM-7AM, please contact night-coverage at [email or web address], password [facility]

## 2015-03-22 NOTE — Care Management Important Message (Signed)
Important Message  Patient Details  Name: Ricardo [name] MRN: [id number] Date of Birth: 11-30-1935   Medicare Important Message Given:  Yes    Camillo Flaming 03/22/2015, 12:58 [place] Message  Patient Details  Name: Ricardo [name] MRN: [id number] Date of Birth: 1935-09-06   Medicare Important Message Given:  Yes    Camillo Flaming 03/22/2015, 12:57 PM

## 2015-03-22 NOTE — Progress Notes (Signed)
Physical Therapy Treatment Patient Details Name: HAYWARD CASS MRN: [id number] DOB: 09-07-1935 Today's Date: 03/22/2015    History of Present Illness 80 yo male admitted with acute encephalopathy, UTI. Hx of bacterial meningitis, blindness, hearing loss, neurogenic bladder, R foot cellulitis    PT Comments    Pt progressing well with mobility, decreased assistance required. Pt tolerated increased ambulation distance of 120' with RW today, overall balance improving as well.   Follow Up Recommendations  No PT follow up;Supervision/Assistance - 24 hour (daughter declines HHPT follow up)     Equipment Recommendations  None recommended by PT    Recommendations for Other Services       Precautions / Restrictions Precautions Precautions: Fall Precaution Comments: high fall risk, blind, HOH (cochlear implant L ear) Restrictions Weight Bearing Restrictions: No    Mobility  Bed Mobility Overal bed mobility: Needs Assistance Bed Mobility: Supine to Sit     Supine to sit: HOB elevated;Min assist     General bed mobility comments: Mod A to raise trunk and to scoot forward to EOB  Transfers Overall transfer level: Needs assistance Equipment used: Rolling walker (2 wheeled) Transfers: Sit to/from Stand Sit to Stand: +2 safety/equipment due to blindness;From elevated surface;Min assist         General transfer comment: Assist to rise, stabilize. Multimodal cues for safety, hand placement.   Ambulation/Gait Ambulation/Gait assistance: Min assist Ambulation Distance (Feet): 120 Feet Assistive device: Rolling walker (2 wheeled) Gait Pattern/deviations: Step-to pattern;Decreased step length - right;Decreased step length - left     General Gait Details: Assist for stability and to maneuver walker. VCs to increase step length. Improved balance today, increased distance. 2/4 dyspnea  +2 to follow with recliner   Stairs            Wheelchair Mobility    Modified  Rankin (Stroke Patients Only)       Balance     Sitting balance-Leahy Scale: Fair       Standing balance-Leahy Scale: Poor                      Cognition Arousal/Alertness: Awake/alert Behavior During Therapy: WFL for tasks assessed/performed Overall Cognitive Status: Within Functional Limits for tasks assessed                      Exercises      General Comments        Pertinent Vitals/Pain Pain Assessment: No/denies pain    Home Living                      Prior Function            PT Goals (current goals can now be found in the care plan section) Acute Rehab PT Goals Patient Stated Goal: to get better.  PT Goal Formulation: With patient Time For Goal Achievement: 03/30/15 Potential to Achieve Goals: Fair Progress towards PT goals: Progressing toward goals    Frequency  Min 3X/week    PT Plan Current plan remains appropriate    Co-evaluation             End of Session Equipment Utilized During Treatment: Gait belt Activity Tolerance: Patient tolerated treatment well Patient left: in chair;with call bell/phone within reach;with chair alarm set     Time: 1345-1405 PT Time Calculation (min) (ACUTE ONLY): 20 min  Charges:  $Gait Training: 8-22 mins  G Codes:      Blondell Reveal Kistler 03/22/2015, 2:18 PM [phone]

## 2015-03-23 DIAGNOSIS — N179 Acute kidney failure, unspecified: Secondary | ICD-10-CM

## 2015-03-23 DIAGNOSIS — D501 Sideropenic dysphagia: Secondary | ICD-10-CM

## 2015-03-23 LAB — BASIC METABOLIC PANEL
Anion gap: 9 (ref 5–15)
BUN: 15 mg/dL (ref 6–20)
CALCIUM: 8.9 mg/dL (ref 8.9–10.3)
CO2: 22 mmol/L (ref 22–32)
CREATININE: 0.96 mg/dL (ref 0.61–1.24)
Chloride: 94 mmol/L — ABNORMAL LOW (ref 101–111)
GFR calc non Af Amer: >60 mL/min (ref >60)
Glucose, Bld: 101 mg/dL — ABNORMAL HIGH (ref 65–99)
Potassium: 4.2 mmol/L (ref 3.5–5.1)
SODIUM: 125 mmol/L — AB (ref 135–145)

## 2015-03-23 LAB — SODIUM
SODIUM: 127 mmol/L — AB (ref 135–145)
SODIUM: 128 mmol/L — AB (ref 135–145)

## 2015-03-23 LAB — OSMOLALITY, URINE: OSMOLALITY UR: 467 mosm/kg (ref 300–900)

## 2015-03-23 LAB — CREATININE, URINE, RANDOM: Creatinine, Urine: 80.28 mg/dL

## 2015-03-23 LAB — SODIUM, URINE, RANDOM: SODIUM UR: 79 mmol/L

## 2015-03-23 NOTE — Progress Notes (Signed)
[facility] Progress Note  Patient: Ricardo [name] [id number]   PCP: [name], DO DOB: 02-Mar-1935   DOA: 03/14/2015   DOS: 03/23/2015   Date of Service: the patient was seen and examined on 03/23/2015  Subjective: Patient continues to deny any acute complaint. On further than fatigue. No nausea no vomiting. Nutrition: Able to tolerate oral diet Activity: Bedridden mostly but works with physical therapy Last BM: 03/19/2015  Assessment and Plan: 1. Acute encephalopathy Probably UTI. On Rocephin with transition to oral Keflex. Completed 7 day treatment.  Mentation improved and currently at his baseline. Blood cultures negative.  2. Eczema of the right foot  Patient was initially started on Bactrim and then developed hyponatremia and it was stopped. Patient was placed on doxycycline but the redness improved and therefore it has been stopped as well. Currently do not see any edema or tenderness or redness. We will continue monitor.  3. Hyponatremia. Sodium remaining stable 127. Patient is asymptomatic. Discussed with nephrology on the phone, recommended to continue current management.  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 She drinks a lot of fluid to flush his catheter as recommended by urology. Currently his Celexa and trazodone has been discontinued. Patient on fluid restriction 1200 mL. Salt tablet 3 times a day, regular diet. -16 L since admission  4. Mild renal insufficiency. Currently improved  monitor with fluid restriction.  5. Essential hypertension. Continue losartan.  6. dyslipidemia. Continue Lipitor.  DVT Prophylaxis: subcutaneous Heparin Nutrition: Regular diet Advance goals of care discussion: DNR/DNI  Brief Summary of Hospitalization:  HPI: As per the H and P dictated on admission, "Mr. Cantu is a 80 year old male with a past medical history bacterial meningitis with residual blindness, hearing loss, and  neurogenic bladder s/p suprapubic catheter; who presents to the emergency department with confusion. The patient lives with his daughter and she is present and she helps provide additional history. She had noticed that he was somewhat confused and was having increased issues with his balance to the point in which he was unable to walk. She notes that the difficulty in walking had actually started a few days ago, but was not as severe as today. He normally is very with it mentally, but notes that he forgot to take a bath after being told to and saying things that didn't make much sense. He is right-handed and denies any weakness, shortness of breath, or chest pain. The daughter states that they had just gone to the doctor yesterday for an infection of his right fifth toe. They had taken cultures there and had started him on Bactrim for which she only taken 2 doses. She also notes that he has been going back almost weekly to Dr. [name] for issues with his suprapubic catheter because of sediment causing the catheter to the point which is not able to be flushed. They last went to his office last Friday he reports that the catheter was changed at that time.  In route with EMS patient was noted to have a temperature 101.52F and he was given 1000 mg of Tylenol. Upon admission patient was evaluated with urinalysis which was positive for large leukocyte, nitrites, many bacteria, and no epithelial cells. WBC was 9.2, hemoglobin 10.9, sodium 125, chloride 94, bicarbonate 20." Daily update, Procedures: None Consultants: None Antibiotics: Anti-infectives    Start     Dose/Rate Route Frequency Ordered Stop   03/20/15 1200  cephALEXin (KEFLEX) capsule 500 mg  Status:  Discontinued  500 mg Oral 4 times per day 03/20/15 0932 03/22/15 1100   03/17/15 1200  doxycycline (VIBRA-TABS) tablet 100 mg  Status:  Discontinued     100 mg Oral Every 12 hours 03/17/15 1035 03/18/15 1101   03/15/15 1800  cefTRIAXone (ROCEPHIN)  1 g in dextrose 5 % 50 mL IVPB  Status:  Discontinued     1 g 100 mL/hr over 30 Minutes Intravenous Every 24 hours 03/14/15 2106 03/20/15 0932   03/15/15 1000  trimethoprim (TRIMPEX) tablet 100 mg  Status:  Discontinued     100 mg Oral  Every morning - 10a 03/14/15 2046 03/14/15 2105   03/14/15 2200  sulfamethoxazole-trimethoprim (BACTRIM DS,SEPTRA DS) 800-160 MG per tablet 1 tablet  Status:  Discontinued     1 tablet Oral 2 times daily 03/14/15 2046 03/17/15 1035   03/14/15 2130  cefTRIAXone (ROCEPHIN) 1 g in dextrose 5 % 50 mL IVPB  Status:  Discontinued     1 g 100 mL/hr over 30 Minutes Intravenous Every 24 hours 03/14/15 2105 03/14/15 2106   03/14/15 1800  cefTRIAXone (ROCEPHIN) 1 g in dextrose 5 % 50 mL IVPB     1 g 100 mL/hr over 30 Minutes Intravenous  Once 03/14/15 1756 03/14/15 1933      Family Communication: No family was present at bedside, at the time of interview.   Disposition:  Expected discharge date: 03/25/2015 Barriers to safe discharge: Improvement in sodium level   Intake/Output Summary (Last 24 hours) at 03/23/15 1857 Last data filed at 03/23/15 1700  Gross per 24 hour  Intake    690 ml  Output   1425 ml  Net   -735 ml   Filed Weights   03/20/15 0500  Weight: 82.8 kg (182 lb 8.7 oz)    Objective: Physical Exam: Filed Vitals:   03/22/15 1422 03/22/15 2104 03/23/15 0906 03/23/15 1336  BP: 128/57 114/61  137/59  Pulse: 89 73  84  Temp: 97.5 F (36.4 C) 98.2 F (36.8 C)  97.2 F (36.2 C)  TempSrc: Oral Oral  Oral  Resp: 18 18  18   Height:      Weight:      SpO2: 96% 95% 94% 96%    General: Appear in mild distress, no Rash; Oral Mucosa moist. Cardiovascular: S1 and S2 Present, no Murmur, no JVD Respiratory: Bilateral Air entry present and Clear to Auscultation, no Crackles, no wheezes Abdomen: Bowel Sound present, Soft and no tenderness Extremities: no Pedal edema, no calf tenderness Neurology: Grossly no focal neuro deficit.  Data  Reviewed: CBC:  Recent Labs Lab 03/17/15 0602 03/21/15 0410  WBC 7.9 8.3  NEUTROABS  --  4.2  HGB 11.2* 13.5  HCT 31.9* 39.1  MCV 87.2 86.1  PLT 208 A999333   Basic Metabolic Panel:  Recent Labs Lab 03/19/15 0554 03/20/15 0856  03/21/15 0410  03/22/15 0442  03/22/15 1153 03/22/15 1608 03/22/15 2000 03/23/15 0608 03/23/15 1605  NA 122* 122*  < > 121*  < > 122*  < > 123* 125* 123* 125* 127*  K 3.8 4.2  --  4.1  --  4.1  --   --   --   --  4.2  --   CL 91* 91*  --  89*  --  91*  --   --   --   --  94*  --   CO2 21* 21*  --  22  --  22  --   --   --   --  22  --   GLUCOSE 126* 100*  --  116*  --  108*  --   --   --   --  101*  --   BUN 10 10  --  14  --  15  --   --   --   --  15  --   CREATININE 0.78 0.86  --  0.86  --  0.77  --   --   --   --  0.96  --   CALCIUM 8.5* 8.7*  --  8.5*  --  8.7*  --   --   --   --  8.9  --   MG  --   --   --   --   --  1.9  --   --   --   --   --   --   < > = values in this interval not displayed. Liver Function Tests:  Recent Labs Lab 03/21/15 0410  AST 34  ALT 66*  ALKPHOS 147*  BILITOT 0.9  PROT 6.7  ALBUMIN 3.7   No results for input(s): LIPASE, AMYLASE in the last 168 hours. No results for input(s): AMMONIA in the last 168 hours.  Cardiac Enzymes: No results for input(s): CKTOTAL, CKMB, CKMBINDEX, TROPONINI in the last 168 hours.  BNP (last 3 results)  Recent Labs  03/20/15 0856  BNP 34.0    CBG: No results for input(s): GLUCAP in the last 168 hours.  Recent Results (from the past 240 hour(s))  Blood culture (routine x 2)     Status: None   Collection Time: 03/14/15  4:32 PM  Result Value Ref Range Status   Specimen Description BLOOD RIGHT ANTECUBITAL  Final   Special Requests BOTTLES DRAWN AEROBIC AND ANAEROBIC [place]  Final   Culture   Final    NO GROWTH 5 DAYS Performed at [facility]    Report Status 03/19/2015 FINAL  Final  Blood culture (routine x 2)     Status: None   Collection Time: 03/14/15   4:37 PM  Result Value Ref Range Status   Specimen Description BLOOD RIGHT HAND  Final   Special Requests BOTTLES DRAWN AEROBIC AND ANAEROBIC [place]  Final   Culture   Final    NO GROWTH 5 DAYS Performed at [facility]    Report Status 03/19/2015 FINAL  Final  Urine culture     Status: None   Collection Time: 03/14/15  4:48 PM  Result Value Ref Range Status   Specimen Description URINE, CATHETERIZED  Final   Special Requests NONE  Final   Culture   Final    MULTIPLE SPECIES PRESENT, SUGGEST RECOLLECTION Performed at [facility]    Report Status 03/15/2015 FINAL  Final     Studies: No results found.   Scheduled Meds: . atorvastatin  10 mg Oral q1800  . enoxaparin (LOVENOX) injection  40 mg Subcutaneous Q24H  . lactobacillus acidophilus & bulgar  1 tablet Oral Daily  . losartan  25 mg Oral Daily  . pantoprazole  40 mg Oral Daily  . polyethylene glycol  17 g Oral Daily  . salmeterol  1 puff Inhalation BID  . senna-docusate  1 tablet Oral BID  . silver sulfADIAZINE  1 application Topical Daily  . sodium chloride  1 g Oral TID WC   Continuous Infusions:  PRN Meds: acetaminophen **OR** acetaminophen, albuterol, ALPRAZolam, HYDROcodone-acetaminophen, ondansetron **OR** ondansetron (ZOFRAN) IV, zolpidem  Time spent: 30 minutes  Author: Berle Mull, MD [facility] Pager: [phone] 03/23/2015 6:57 PM  If 7PM-7AM, please contact night-coverage at [email or web address], password [facility]

## 2015-03-24 LAB — BASIC METABOLIC PANEL
Anion gap: 9 (ref 5–15)
BUN: 15 mg/dL (ref 6–20)
CALCIUM: 8.6 mg/dL — AB (ref 8.9–10.3)
CO2: 23 mmol/L (ref 22–32)
CREATININE: 0.85 mg/dL (ref 0.61–1.24)
Chloride: 96 mmol/L — ABNORMAL LOW (ref 101–111)
GFR calc Af Amer: >60 mL/min (ref >60)
GFR calc non Af Amer: >60 mL/min (ref >60)
Glucose, Bld: 113 mg/dL — ABNORMAL HIGH (ref 65–99)
Potassium: 4.4 mmol/L (ref 3.5–5.1)
Sodium: 128 mmol/L — ABNORMAL LOW (ref 135–145)

## 2015-03-24 LAB — SODIUM
SODIUM: 126 mmol/L — AB (ref 135–145)
SODIUM: 126 mmol/L — AB (ref 135–145)
Sodium: 126 mmol/L — ABNORMAL LOW (ref 135–145)

## 2015-03-24 NOTE — Progress Notes (Signed)
[facility] Progress Note  Patient: Ricardo [name] [id number]   PCP: [name], DO DOB: 03-03-1935   DOA: 03/14/2015   DOS: 03/24/2015   Date of Service: the patient was seen and examined on 03/24/2015  Subjective: Patient complained of some fatigue. Patient's daughter brought in some notes in April and September 2016 patient's sodium was more than 130. Nutrition: Able to tolerate oral diet Activity: Bedridden mostly but works with physical therapy Last BM: 03/24/2015  Assessment and Plan: 1. Acute encephalopathy Probably UTI. On Rocephin with transition to oral Keflex. Completed 7 day treatment.  Mentation improved and currently at his baseline. Blood cultures negative.  2. Eczema of the right foot  Patient was initially started on Bactrim and then developed hyponatremia and it was stopped. Patient was placed on doxycycline but the redness improved and therefore it has been stopped as well. Currently do not see any edema or tenderness or redness. We will continue monitor.  3. Hyponatremia. Sodium remaining stable 127. Patient is asymptomatic. Discussed with nephrology on the phone, recommended to continue current management.  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 Patient drinks a lot of fluid to flush his catheter as recommended by urology. Currently his Celexa and trazodone has been discontinued. Patient on fluid restriction 800 mL. Salt tablet 3 times a day, regular diet. -17 L since admission  4. Mild renal insufficiency. Currently improved  monitor with fluid restriction.  5. Essential hypertension. Continue losartan.  6. dyslipidemia. Continue Lipitor.  DVT Prophylaxis: subcutaneous Heparin Nutrition: Regular diet Advance goals of care discussion: DNR/DNI  Brief Summary of Hospitalization:  HPI: As per the H and P dictated on admission, "Mr. Penders is a 80 year old male with a past medical history bacterial  meningitis with residual blindness, hearing loss, and neurogenic bladder s/p suprapubic catheter; who presents to the emergency department with confusion. The patient lives with his daughter and she is present and she helps provide additional history. She had noticed that he was somewhat confused and was having increased issues with his balance to the point in which he was unable to walk. She notes that the difficulty in walking had actually started a few days ago, but was not as severe as today. He normally is very with it mentally, but notes that he forgot to take a bath after being told to and saying things that didn't make much sense. He is right-handed and denies any weakness, shortness of breath, or chest pain. The daughter states that they had just gone to the doctor yesterday for an infection of his right fifth toe. They had taken cultures there and had started him on Bactrim for which she only taken 2 doses. She also notes that he has been going back almost weekly to Dr. [name] for issues with his suprapubic catheter because of sediment causing the catheter to the point which is not able to be flushed. They last went to his office last Friday he reports that the catheter was changed at that time.  In route with EMS patient was noted to have a temperature 101.20F and he was given 1000 mg of Tylenol. Upon admission patient was evaluated with urinalysis which was positive for large leukocyte, nitrites, many bacteria, and no epithelial cells. WBC was 9.2, hemoglobin 10.9, sodium 125, chloride 94, bicarbonate 20." Daily update, Procedures: None Consultants: None Antibiotics: Anti-infectives    Start     Dose/Rate Route Frequency Ordered Stop   03/20/15 1200  cephALEXin (KEFLEX) capsule 500 mg  Status:  Discontinued     500 mg Oral 4 times per day 03/20/15 0932 03/22/15 1100   03/17/15 1200  doxycycline (VIBRA-TABS) tablet 100 mg  Status:  Discontinued     100 mg Oral Every 12 hours 03/17/15 1035  03/18/15 1101   03/15/15 1800  cefTRIAXone (ROCEPHIN) 1 g in dextrose 5 % 50 mL IVPB  Status:  Discontinued     1 g 100 mL/hr over 30 Minutes Intravenous Every 24 hours 03/14/15 2106 03/20/15 0932   03/15/15 1000  trimethoprim (TRIMPEX) tablet 100 mg  Status:  Discontinued     100 mg Oral  Every morning - 10a 03/14/15 2046 03/14/15 2105   03/14/15 2200  sulfamethoxazole-trimethoprim (BACTRIM DS,SEPTRA DS) 800-160 MG per tablet 1 tablet  Status:  Discontinued     1 tablet Oral 2 times daily 03/14/15 2046 03/17/15 1035   03/14/15 2130  cefTRIAXone (ROCEPHIN) 1 g in dextrose 5 % 50 mL IVPB  Status:  Discontinued     1 g 100 mL/hr over 30 Minutes Intravenous Every 24 hours 03/14/15 2105 03/14/15 2106   03/14/15 1800  cefTRIAXone (ROCEPHIN) 1 g in dextrose 5 % 50 mL IVPB     1 g 100 mL/hr over 30 Minutes Intravenous  Once 03/14/15 1756 03/14/15 1933      Family Communication: No family was present at bedside, at the time of interview.   Disposition:  Expected discharge date: 03/25/2015 Barriers to safe discharge: Improvement in sodium level, discussion with nephrology in 03/25/2015   Intake/Output Summary (Last 24 hours) at 03/24/15 1638 Last data filed at 03/24/15 1300  Gross per 24 hour  Intake    870 ml  Output   1651 ml  Net   -781 ml   Filed Weights   03/20/15 0500  Weight: 82.8 kg (182 lb 8.7 oz)    Objective: Physical Exam: Filed Vitals:   03/23/15 2108 03/24/15 0557 03/24/15 0827 03/24/15 1335  BP: 143/77 144/71  142/69  Pulse: 72 78  86  Temp: 97.6 F (36.4 C) 97.4 F (36.3 C)  98 F (36.7 C)  TempSrc: Oral Oral  Oral  Resp: 18 18  16   Height:      Weight:      SpO2: 94% 94% 94% 97%    General: Appear in mild distress, no Rash; Oral Mucosa moist. Cardiovascular: S1 and S2 Present, no Murmur, no JVD Respiratory: Bilateral Air entry present and Clear to Auscultation, no Crackles, no wheezes Abdomen: Bowel Sound present, Soft and no tenderness Extremities: no  Pedal edema, no calf tenderness Neurology: Grossly no focal neuro deficit.  Data Reviewed: CBC:  Recent Labs Lab 03/21/15 0410  WBC 8.3  NEUTROABS 4.2  HGB 13.5  HCT 39.1  MCV 86.1  PLT A999333   Basic Metabolic Panel:  Recent Labs Lab 03/19/15 0554 03/20/15 0856  03/21/15 0410  03/22/15 0442  03/23/15 0608 03/23/15 1605 03/23/15 2045 03/24/15 0315 03/24/15 0915 03/24/15 1424  NA 122* 122*  < > 121*  < > 122*  < > 125* 127* 128* 126* 126* 126*  K 3.8 4.2  --  4.1  --  4.1  --  4.2  --   --   --   --   --   CL 91* 91*  --  89*  --  91*  --  94*  --   --   --   --   --   CO2 21* 21*  --  22  --  22  --  22  --   --   --   --   --   GLUCOSE 126* 100*  --  116*  --  108*  --  101*  --   --   --   --   --   BUN 10 10  --  14  --  15  --  15  --   --   --   --   --   CREATININE 0.78 0.86  --  0.86  --  0.77  --  0.96  --   --   --   --   --   CALCIUM 8.5* 8.7*  --  8.5*  --  8.7*  --  8.9  --   --   --   --   --   MG  --   --   --   --   --  1.9  --   --   --   --   --   --   --   < > = values in this interval not displayed. Liver Function Tests:  Recent Labs Lab 03/21/15 0410  AST 34  ALT 66*  ALKPHOS 147*  BILITOT 0.9  PROT 6.7  ALBUMIN 3.7   No results for input(s): LIPASE, AMYLASE in the last 168 hours. No results for input(s): AMMONIA in the last 168 hours.  Cardiac Enzymes: No results for input(s): CKTOTAL, CKMB, CKMBINDEX, TROPONINI in the last 168 hours.  BNP (last 3 results)  Recent Labs  03/20/15 0856  BNP 34.0    CBG: No results for input(s): GLUCAP in the last 168 hours.  Recent Results (from the past 240 hour(s))  Urine culture     Status: None   Collection Time: 03/14/15  4:48 PM  Result Value Ref Range Status   Specimen Description URINE, CATHETERIZED  Final   Special Requests NONE  Final   Culture   Final    MULTIPLE SPECIES PRESENT, SUGGEST RECOLLECTION Performed at [facility]    Report Status 03/15/2015 FINAL  Final      Studies: No results found.   Scheduled Meds: . atorvastatin  10 mg Oral q1800  . enoxaparin (LOVENOX) injection  40 mg Subcutaneous Q24H  . lactobacillus acidophilus & bulgar  1 tablet Oral Daily  . losartan  25 mg Oral Daily  . pantoprazole  40 mg Oral Daily  . polyethylene glycol  17 g Oral Daily  . salmeterol  1 puff Inhalation BID  . senna-docusate  1 tablet Oral BID  . silver sulfADIAZINE  1 application Topical Daily  . sodium chloride  1 g Oral TID WC   Continuous Infusions:  PRN Meds: acetaminophen **OR** acetaminophen, albuterol, ALPRAZolam, HYDROcodone-acetaminophen, ondansetron **OR** ondansetron (ZOFRAN) IV, zolpidem  Time spent: 30 minutes  Author: Berle Mull, MD [facility] Pager: [phone] 03/24/2015 4:38 PM  If 7PM-7AM, please contact night-coverage at [email or web address], password [facility]

## 2015-03-25 LAB — BASIC METABOLIC PANEL
ANION GAP: 9 (ref 5–15)
ANION GAP: 10 (ref 5–15)
BUN: 13 mg/dL (ref 6–20)
BUN: 15 mg/dL (ref 6–20)
CALCIUM: 8.6 mg/dL — AB (ref 8.9–10.3)
CHLORIDE: 94 mmol/L — AB (ref 101–111)
CHLORIDE: 96 mmol/L — AB (ref 101–111)
CO2: 23 mmol/L (ref 22–32)
CO2: 24 mmol/L (ref 22–32)
Calcium: 8.8 mg/dL — ABNORMAL LOW (ref 8.9–10.3)
Creatinine, Ser: 0.85 mg/dL (ref 0.61–1.24)
Creatinine, Ser: 0.96 mg/dL (ref 0.61–1.24)
GFR calc non Af Amer: >60 mL/min (ref >60)
GFR calc non Af Amer: >60 mL/min (ref >60)
GLUCOSE: 93 mg/dL (ref 65–99)
GLUCOSE: 109 mg/dL — AB (ref 65–99)
POTASSIUM: 4.3 mmol/L (ref 3.5–5.1)
POTASSIUM: 4.6 mmol/L (ref 3.5–5.1)
Sodium: 126 mmol/L — ABNORMAL LOW (ref 135–145)
Sodium: 130 mmol/L — ABNORMAL LOW (ref 135–145)

## 2015-03-25 MED ORDER — GUAIFENESIN-DM 100-10 MG/5ML PO SYRP: 10.0000 mL | ORAL_SOLUTION | ORAL | Status: DC | PRN

## 2015-03-25 MED ORDER — SODIUM CHLORIDE 1 G PO TABS
2.0000 g | ORAL_TABLET | Freq: Three times a day (TID) | ORAL | Status: DC
  Administered 2015-03-25 – 2015-03-26 (×4): 2 g via ORAL
  Filled 2015-03-25 (×7): qty 2

## 2015-03-25 MED ORDER — SODIUM CHLORIDE 1 G PO TABS
1.0000 g | ORAL_TABLET | Freq: Once | ORAL | Status: AC
  Administered 2015-03-25: 1 g via ORAL
  Filled 2015-03-25: qty 1

## 2015-03-25 MED ORDER — AMLODIPINE BESYLATE 5 MG PO TABS
5.0000 mg | ORAL_TABLET | Freq: Every day | ORAL | Status: DC
  Administered 2015-03-25 – 2015-03-27 (×3): 5 mg via ORAL
  Filled 2015-03-25 (×3): qty 1

## 2015-03-25 NOTE — Progress Notes (Addendum)
[facility] Progress Note  Patient: Ricardo [name] [id number]   PCP: [name], DO DOB: [birth year]   DOA: 03/14/2015   DOS: 03/25/2015   Date of Service: the patient was seen and examined on 03/25/2015  Subjective: Does not have any acute complaint at present cough has been worsening. Nutrition: Able to tolerate oral diet Activity: Bedridden mostly but works with physical therapy Last BM: 03/24/2015  Assessment and Plan: 1. Acute encephalopathy Probably UTI. On Rocephin with transition to oral Keflex. Completed 7 day treatment.  Mentation improved and currently at his baseline. Blood cultures negative.  2. Eczema of the right foot  Patient was initially started on Bactrim and then developed hyponatremia and it was stopped. Patient was placed on doxycycline but the redness improved and therefore it has been stopped as well. Currently do not see any edema or tenderness or redness. We will continue monitor.  3. Hyponatremia. Sodium remaining stable 127. Patient is asymptomatic. Discussed with nephrology on the phone, recommended to continue current management.  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 Patient drinks a lot of fluid to flush his catheter as recommended by urology. Currently his Celexa and trazodone has been discontinued. Patient on fluid restriction 800 mL. Salt tablet 3 times a day, regular diet. Patient input from nephrology Dr. Jonnie Finner, recommended to continue current management with stopping losartan as it can cause ADH imbalance.  4. Mild renal insufficiency. Currently improved  monitor with fluid restriction.  5. Essential hypertension. Discontinue losartan and place the patient on amlodipine.  6. dyslipidemia. Continue Lipitor.  7. Cough. Symptomatically treatment at present.  DVT Prophylaxis: subcutaneous Heparin Nutrition: Regular diet Advance goals of care discussion: DNR/DNI  Brief Summary of  Hospitalization:  HPI: As per the H and P dictated on admission, "Mr. Thone is a 80 year old male with a past medical history bacterial meningitis with residual blindness, hearing loss, and neurogenic bladder s/p suprapubic catheter; who presents to the emergency department with confusion. The patient lives with his daughter and she is present and she helps provide additional history. She had noticed that he was somewhat confused and was having increased issues with his balance to the point in which he was unable to walk. She notes that the difficulty in walking had actually started a few days ago, but was not as severe as today. He normally is very with it mentally, but notes that he forgot to take a bath after being told to and saying things that didn't make much sense. He is right-handed and denies any weakness, shortness of breath, or chest pain. The daughter states that they had just gone to the doctor yesterday for an infection of his right fifth toe. They had taken cultures there and had started him on Bactrim for which she only taken 2 doses. She also notes that he has been going back almost weekly to Dr. [name] for issues with his suprapubic catheter because of sediment causing the catheter to the point which is not able to be flushed. They last went to his office last Friday he reports that the catheter was changed at that time.  In route with EMS patient was noted to have a temperature 101.67F and he was given 1000 mg of Tylenol. Upon admission patient was evaluated with urinalysis which was positive for large leukocyte, nitrites, many bacteria, and no epithelial cells. WBC was 9.2, hemoglobin 10.9, sodium 125, chloride 94, bicarbonate 20." Daily update, Procedures: None Consultants: None Antibiotics: Anti-infectives    Start  Dose/Rate Route Frequency Ordered Stop   03/20/15 1200  cephALEXin (KEFLEX) capsule 500 mg  Status:  Discontinued     500 mg Oral 4 times per day 03/20/15 0932  03/22/15 1100   03/17/15 1200  doxycycline (VIBRA-TABS) tablet 100 mg  Status:  Discontinued     100 mg Oral Every 12 hours 03/17/15 1035 03/18/15 1101   03/15/15 1800  cefTRIAXone (ROCEPHIN) 1 g in dextrose 5 % 50 mL IVPB  Status:  Discontinued     1 g 100 mL/hr over 30 Minutes Intravenous Every 24 hours 03/14/15 2106 03/20/15 0932   03/15/15 1000  trimethoprim (TRIMPEX) tablet 100 mg  Status:  Discontinued     100 mg Oral  Every morning - 10a 03/14/15 2046 03/14/15 2105   03/14/15 2200  sulfamethoxazole-trimethoprim (BACTRIM DS,SEPTRA DS) 800-160 MG per tablet 1 tablet  Status:  Discontinued     1 tablet Oral 2 times daily 03/14/15 2046 03/17/15 1035   03/14/15 2130  cefTRIAXone (ROCEPHIN) 1 g in dextrose 5 % 50 mL IVPB  Status:  Discontinued     1 g 100 mL/hr over 30 Minutes Intravenous Every 24 hours 03/14/15 2105 03/14/15 2106   03/14/15 1800  cefTRIAXone (ROCEPHIN) 1 g in dextrose 5 % 50 mL IVPB     1 g 100 mL/hr over 30 Minutes Intravenous  Once 03/14/15 1756 03/14/15 1933      Family Communication: family was present at bedside, at the time of interview. All questions were answered satisfactorily  Disposition:  Expected discharge date: 03/26/2015 Barriers to safe discharge: Improvement in sodium level, discussion with nephrology in 03/25/2015   Intake/Output Summary (Last 24 hours) at 03/25/15 2019 Last data filed at 03/25/15 1852  Gross per 24 hour  Intake    410 ml  Output   2250 ml  Net  -1840 ml   Filed Weights   03/20/15 0500  Weight: 82.8 kg (182 lb 8.7 oz)    Objective: Physical Exam: Filed Vitals:   03/25/15 1013 03/25/15 1030 03/25/15 1339 03/25/15 1942  BP:  128/65 139/58   Pulse:  79 74 77  Temp:  98.6 F (37 C) 98.6 F (37 C)   TempSrc:  Oral Oral   Resp:  20 18 18   Height:      Weight:      SpO2: 96% 97% 97% 97%    General: Appear in mild distress, no Rash; Oral Mucosa moist. Cardiovascular: S1 and S2 Present, no Murmur, no JVD Respiratory:  Bilateral Air entry present and Clear to Auscultation, no Crackles, no wheezes Abdomen: Bowel Sound present, Soft and no tenderness Extremities: no Pedal edema, no calf tenderness Neurology: Grossly no focal neuro deficit.  Data Reviewed: CBC:  Recent Labs Lab 03/21/15 0410  WBC 8.3  NEUTROABS 4.2  HGB 13.5  HCT 39.1  MCV 86.1  PLT A999333   Basic Metabolic Panel:  Recent Labs Lab 03/22/15 0442  03/23/15 0608  03/24/15 0915 03/24/15 1424 03/24/15 1951 03/25/15 0549 03/25/15 1539  NA 122*  < > 125*  < > 126* 126* 128* 126* 130*  K 4.1  --  4.2  --   --   --  4.4 4.3 4.6  CL 91*  --  94*  --   --   --  96* 94* 96*  CO2 22  --  22  --   --   --  23 23 24   GLUCOSE 108*  --  101*  --   --   --  113* 93 109*  BUN 15  --  15  --   --   --  15 13 15   CREATININE 0.77  --  0.96  --   --   --  0.85 0.85 0.96  CALCIUM 8.7*  --  8.9  --   --   --  8.6* 8.6* 8.8*  MG 1.9  --   --   --   --   --   --   --   --   < > = values in this interval not displayed. Liver Function Tests:  Recent Labs Lab 03/21/15 0410  AST 34  ALT 66*  ALKPHOS 147*  BILITOT 0.9  PROT 6.7  ALBUMIN 3.7   No results for input(s): LIPASE, AMYLASE in the last 168 hours. No results for input(s): AMMONIA in the last 168 hours.  Cardiac Enzymes: No results for input(s): CKTOTAL, CKMB, CKMBINDEX, TROPONINI in the last 168 hours.  BNP (last 3 results)  Recent Labs  03/20/15 0856  BNP 34.0    CBG: No results for input(s): GLUCAP in the last 168 hours.  No results found for this or any previous visit (from the past 240 hour(s)).   Studies: No results found.   Scheduled Meds: . amLODipine  5 mg Oral Daily  . atorvastatin  10 mg Oral q1800  . enoxaparin (LOVENOX) injection  40 mg Subcutaneous Q24H  . pantoprazole  40 mg Oral Daily  . polyethylene glycol  17 g Oral Daily  . salmeterol  1 puff Inhalation BID  . senna-docusate  1 tablet Oral BID  . silver sulfADIAZINE  1 application Topical Daily    . sodium chloride  2 g Oral TID WC   Continuous Infusions:  PRN Meds: acetaminophen **OR** acetaminophen, albuterol, ALPRAZolam, guaiFENesin-dextromethorphan, HYDROcodone-acetaminophen, ondansetron **OR** ondansetron (ZOFRAN) IV, zolpidem  Time spent: 30 minutes  Author: Berle Mull, MD [facility] Pager: [phone] 03/25/2015 8:19 PM  If 7PM-7AM, please contact night-coverage at [email or web address], password [facility]

## 2015-03-25 NOTE — Progress Notes (Signed)
Occupational Therapy Treatment Patient Details Name: Ricardo [name] MRN: [id number] DOB: 10/11/1935 Today's Date: 03/25/2015    History of present illness 80 yo male admitted with acute encephalopathy, UTI. Hx of bacterial meningitis, blindness, hearing loss, neurogenic bladder, R foot cellulitis   OT comments  Focused on self feeding in sitting  Follow Up Recommendations  Home health OT;Supervision/Assistance - 24 hour          Precautions / Restrictions Precautions Precautions: Fall Precaution Comments: high fall risk, blind, HOH (cochlear implant L ear) Restrictions Weight Bearing Restrictions: No       Mobility Bed Mobility               General bed mobility comments: pt sitting in chair  Transfers                 General transfer comment: NT        ADL Overall ADL's : Modified independent Eating/Feeding: Sitting;Moderate assistance Eating/Feeding Details (indicate cue type and reason): focus of OT session was self feeding.  Pt did well with finger foods- holding bowl with L hand and feeding self with R hand.  Pt did well with broccli, and sandwich- but needed increased A with peaches. Pt did use a fork to stab peach but would often miss and need A.  Pt with good participation Grooming: Wash/dry face;Sitting;Minimal assistance                                        Vision      pt legally blind                      Cognition   Behavior During Therapy: WFL for tasks assessed/performed Overall Cognitive Status: Within Functional Limits for tasks assessed                               General Comments Pt did well with finger foods in sitting with lunch with mod A.    Pertinent Vitals/ Pain       Pain Assessment: No/denies pain            Progress Toward Goals  OT Goals(current goals can now be found in the care plan section)  Progress towards OT goals: Progressing toward goals     Plan Discharge plan  remains appropriate          Activity Tolerance Patient tolerated treatment well   Patient Left in chair;with call bell/phone within reach;with chair alarm set   Nurse Communication Mobility status           Charges: OT General Charges $OT Visit: 1 Procedure OT Treatments $Self Care/Home Management : 8-22 mins  [place], Edwena Felty D 03/25/2015, 1:11 PM

## 2015-03-26 LAB — BASIC METABOLIC PANEL
ANION GAP: 9 (ref 5–15)
Anion gap: 7 (ref 5–15)
BUN: 13 mg/dL (ref 6–20)
BUN: 14 mg/dL (ref 6–20)
CHLORIDE: 96 mmol/L — AB (ref 101–111)
CHLORIDE: 97 mmol/L — AB (ref 101–111)
CO2: 24 mmol/L (ref 22–32)
CO2: 24 mmol/L (ref 22–32)
CREATININE: 0.92 mg/dL (ref 0.61–1.24)
Calcium: 8.7 mg/dL — ABNORMAL LOW (ref 8.9–10.3)
Calcium: 8.8 mg/dL — ABNORMAL LOW (ref 8.9–10.3)
Creatinine, Ser: 0.87 mg/dL (ref 0.61–1.24)
GFR calc Af Amer: >60 mL/min (ref >60)
GFR calc Af Amer: >60 mL/min (ref >60)
GFR calc non Af Amer: >60 mL/min (ref >60)
GLUCOSE: 95 mg/dL (ref 65–99)
GLUCOSE: 138 mg/dL — AB (ref 65–99)
POTASSIUM: 4.1 mmol/L (ref 3.5–5.1)
Potassium: 4 mmol/L (ref 3.5–5.1)
SODIUM: 129 mmol/L — AB (ref 135–145)
SODIUM: 128 mmol/L — AB (ref 135–145)

## 2015-03-26 MED ORDER — SODIUM CHLORIDE 1 G PO TABS
3.0000 g | ORAL_TABLET | Freq: Three times a day (TID) | ORAL | Status: DC
  Administered 2015-03-26: 3 g via ORAL
  Administered 2015-03-26: 1 g via ORAL
  Administered 2015-03-27: 3 g via ORAL
  Filled 2015-03-26 (×6): qty 3

## 2015-03-26 NOTE — Progress Notes (Signed)
[facility] Progress Note  Patient: Ricardo [name] [id number]   PCP: [name], DO DOB: 19-Oct-1935   DOA: 03/14/2015   DOS: 03/26/2015   Date of Service: the patient was seen and examined on 03/26/2015  Subjective: No acute symptoms at present Nutrition: Able to tolerate oral diet Activity: Bedridden mostly but works with physical therapy Last BM: 03/24/2015  Assessment and Plan: 1. Acute encephalopathy Probably UTI. On Rocephin with transition to oral Keflex. Completed 7 day treatment.  Mentation improved and currently at his baseline. Blood cultures negative.  2. Eczema of the right foot  Patient was initially started on Bactrim and then developed hyponatremia and it was stopped. Patient was placed on doxycycline but the redness improved and therefore it has been stopped as well. Currently do not see any edema or tenderness or redness. We will continue monitor.  3. Hyponatremia. Sodium trending downwards, nephrology will be consulted. Patient is asymptomatic. We will await input likely SIADH Patient drinks a lot of fluid to flush his catheter as recommended by urology. Currently his Celexa and trazodone has been discontinued. Patient on fluid restriction 800 mL. Salt tablet 3 times a day, regular diet.  4. Mild renal insufficiency. Currently improved  monitor with fluid restriction.  5. Essential hypertension. Discontinue losartan and place the patient on amlodipine.  6. dyslipidemia. Continue Lipitor.  7. Cough. Symptomatically treatment at present.  DVT Prophylaxis: subcutaneous Heparin Nutrition: Regular diet Advance goals of care discussion: DNR/DNI  Brief Summary of Hospitalization:  HPI: As per the H and P dictated on admission, "Ricardo [name] is a 80 year old male with a past medical history bacterial meningitis with residual blindness, hearing loss, and neurogenic bladder s/p suprapubic catheter; who presents to the emergency department with  confusion. The patient lives with his daughter and she is present and she helps provide additional history. She had noticed that he was somewhat confused and was having increased issues with his balance to the point in which he was unable to walk. She notes that the difficulty in walking had actually started a few days ago, but was not as severe as today. He normally is very with it mentally, but notes that he forgot to take a bath after being told to and saying things that didn't make much sense. He is right-handed and denies any weakness, shortness of breath, or chest pain. The daughter states that they had just gone to the doctor yesterday for an infection of his right fifth toe. They had taken cultures there and had started him on Bactrim for which she only taken 2 doses. She also notes that he has been going back almost weekly to Dr. [name] for issues with his suprapubic catheter because of sediment causing the catheter to the point which is not able to be flushed. They last went to his office last Friday he reports that the catheter was changed at that time.  In route with EMS patient was noted to have a temperature 101.2F and he was given 1000 mg of Tylenol. Upon admission patient was evaluated with urinalysis which was positive for large leukocyte, nitrites, many bacteria, and no epithelial cells. WBC was 9.2, hemoglobin 10.9, sodium 125, chloride 94, bicarbonate 20." Consultants: Nephrology Antibiotics: Anti-infectives    Start     Dose/Rate Route Frequency Ordered Stop   03/20/15 1200  cephALEXin (KEFLEX) capsule 500 mg  Status:  Discontinued     500 mg Oral 4 times per day 03/20/15 0932 03/22/15 1100   03/17/15 1200  doxycycline (VIBRA-TABS) tablet 100 mg  Status:  Discontinued     100 mg Oral Every 12 hours 03/17/15 1035 03/18/15 1101   03/15/15 1800  cefTRIAXone (ROCEPHIN) 1 g in dextrose 5 % 50 mL IVPB  Status:  Discontinued     1 g 100 mL/hr over 30 Minutes Intravenous Every 24  hours 03/14/15 2106 03/20/15 0932   03/15/15 1000  trimethoprim (TRIMPEX) tablet 100 mg  Status:  Discontinued     100 mg Oral  Every morning - 10a 03/14/15 2046 03/14/15 2105   03/14/15 2200  sulfamethoxazole-trimethoprim (BACTRIM DS,SEPTRA DS) 800-160 MG per tablet 1 tablet  Status:  Discontinued     1 tablet Oral 2 times daily 03/14/15 2046 03/17/15 1035   03/14/15 2130  cefTRIAXone (ROCEPHIN) 1 g in dextrose 5 % 50 mL IVPB  Status:  Discontinued     1 g 100 mL/hr over 30 Minutes Intravenous Every 24 hours 03/14/15 2105 03/14/15 2106   03/14/15 1800  cefTRIAXone (ROCEPHIN) 1 g in dextrose 5 % 50 mL IVPB     1 g 100 mL/hr over 30 Minutes Intravenous  Once 03/14/15 1756 03/14/15 1933      Family Communication: family was present at bedside, at the time of interview. All questions were answered satisfactorily  Disposition:  Expected discharge date: 03/27/2015 Barriers to safe discharge: Sodium level improvement and nephrology consult  Intake/Output Summary (Last 24 hours) at 03/26/15 1942 Last data filed at 03/26/15 1735  Gross per 24 hour  Intake    780 ml  Output   1875 ml  Net  -1095 ml   Filed Weights   03/20/15 0500  Weight: 82.8 kg (182 lb 8.7 oz)    Objective: Physical Exam: Filed Vitals:   03/25/15 2155 03/26/15 0459 03/26/15 0845 03/26/15 1335  BP: 134/68 131/80  128/62  Pulse: 85 73  80  Temp: 98.2 F (36.8 C) 97.4 F (36.3 C)  97.6 F (36.4 C)  TempSrc: Oral Oral  Oral  Resp: 18 18  18   Height:      Weight:      SpO2: 96% 93% 95% 98%    General: Appear in mild distress, no Rash; Oral Mucosa moist. Cardiovascular: S1 and S2 Present, no Murmur, no JVD Respiratory: Bilateral Air entry present and Clear to Auscultation, no Crackles, no wheezes Abdomen: Bowel Sound present, Soft and no tenderness Extremities: no Pedal edema, no calf tenderness Neurology: Grossly no focal neuro deficit.  Data Reviewed: CBC:  Recent Labs Lab 03/21/15 0410  WBC 8.3    NEUTROABS 4.2  HGB 13.5  HCT 39.1  MCV 86.1  PLT A999333   Basic Metabolic Panel:  Recent Labs Lab 03/22/15 0442  03/24/15 1951 03/25/15 0549 03/25/15 1539 03/26/15 0550 03/26/15 1032  NA 122*  < > 128* 126* 130* 129* 128*  K 4.1  < > 4.4 4.3 4.6 4.1 4.0  CL 91*  < > 96* 94* 96* 96* 97*  CO2 22  < > 23 23 24 24 24   GLUCOSE 108*  < > 113* 93 109* 95 138*  BUN 15  < > 15 13 15 13 14   CREATININE 0.77  < > 0.85 0.85 0.96 0.87 0.92  CALCIUM 8.7*  < > 8.6* 8.6* 8.8* 8.7* 8.8*  MG 1.9  --   --   --   --   --   --   < > = values in this interval not displayed. Liver Function Tests:  Recent Labs  Lab 03/21/15 0410  AST 34  ALT 66*  ALKPHOS 147*  BILITOT 0.9  PROT 6.7  ALBUMIN 3.7   No results for input(s): LIPASE, AMYLASE in the last 168 hours. No results for input(s): AMMONIA in the last 168 hours.  Cardiac Enzymes: No results for input(s): CKTOTAL, CKMB, CKMBINDEX, TROPONINI in the last 168 hours.  BNP (last 3 results)  Recent Labs  03/20/15 0856  BNP 34.0    CBG: No results for input(s): GLUCAP in the last 168 hours.  No results found for this or any previous visit (from the past 240 hour(s)).   Studies: No results found.   Scheduled Meds: . amLODipine  5 mg Oral Daily  . atorvastatin  10 mg Oral q1800  . enoxaparin (LOVENOX) injection  40 mg Subcutaneous Q24H  . pantoprazole  40 mg Oral Daily  . polyethylene glycol  17 g Oral Daily  . salmeterol  1 puff Inhalation BID  . senna-docusate  1 tablet Oral BID  . silver sulfADIAZINE  1 application Topical Daily  . sodium chloride  3 g Oral TID WC   Continuous Infusions:  PRN Meds: acetaminophen **OR** acetaminophen, albuterol, ALPRAZolam, guaiFENesin-dextromethorphan, HYDROcodone-acetaminophen, ondansetron **OR** ondansetron (ZOFRAN) IV, zolpidem  Time spent: 30 minutes  Author: Berle Mull, MD [facility] Pager: [phone] 03/26/2015 12:42 PM  If 7PM-7AM, please contact night-coverage at  [email or web address], password [facility]

## 2015-03-26 NOTE — Consult Note (Signed)
Renal Service Consult Note [facility]  Ricardo [name] 03/26/2015 Ricardo [name] D Requesting Physician:  Dr Posey Pronto, [name].   Reason for Consult:  Hyponatremia HPI: The patient is a 80 y.o. year-old with hx of HTN, HL, meningitis a few yrs ago complicated by blindness/ deafness and loss of balance. Lives with his adult children, walks "4 miles per week" with a walker.  Admitted for AMS on Feb 9th. Temp was 101.6 in ED, wBC 9.2.  UA + for nitrite/ LE, many bact. Admitted and rx for urosepsis with IV Rocephin, transitioned to po Keflex then completed AB course.  Blood cx's were neg.  Has had low Na levels which won't improve. He was told to drink a lot of fluid to flush his suprapubic catheter by his urologist.  Creat is 0.92.  Na 128 today. Urine osm 351 and repeat 467.  UNa 90 w repeat 79.  BP's normal. TSH and cortisol and wnl.  Asked to see for hyponatremia.   Patient w/o complaints, denies any recent CP, SOB, cough, abd pain, n/v/d.  No dysuria, hematuria or nsaid use.  No hx renal failure.     Chart review: March 2016 > urosepsis d/t pseudomonas/ Klebsiella, rx IV then po abx. Has SP cath, f/b urology. Hyponatremia improved with IVF's.  ^LFT's, improved at dc. COPD. NGB with bladder stone. HTN, losartan stopped and held at dc. HL.    Past Medical History  Past Medical History  Diagnosis Date  . Rotator cuff tear     right  . Hypertension   . High cholesterol   . Meningitis   . Enlarged prostate   . 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 Deaf     due to meningitis  . Loss of balance     due to meningitis  . Unable to walk     due to meningitis  . Eczema   . Cancer [facility]) 1956    left testicle  . Skin cancer   . Neurogenic bladder     History of   Past Surgical History  Past Surgical History  Procedure Laterality Date  . Appendectomy    . Prostate surgery      biopsy x 2  . Tonsillectomy      childhood  . Testicle removal Left     October 1956  . Cochlear  implant    . Cystoscopy with litholapaxy N/A 05/14/2014    Procedure: CYSTOSCOPY WITH LITHOLAPAXY;  Surgeon: [name], MD;  Location: [facility];  Service: Urology;  Laterality: N/A;  . Insertion of suprapubic catheter N/A 05/14/2014    Procedure: INSERTION OF SUPRAPUBIC CATHETER;  Surgeon: [name], MD;  Location: [facility];  Service: Urology;  Laterality: N/A;   Family History  Family History  Problem Relation Age of Onset  . Heart failure Father   . Stroke Mother    Social History  reports that he quit smoking about 50 years ago. His smoking use included Cigarettes. He started smoking about 60 years ago. He quit after 10 years of use. He has never used smokeless tobacco. He reports that he does not drink alcohol or use illicit drugs. Allergies  Allergies  Allergen Reactions  . Peanut Oil Shortness Of Breath and Swelling    Itching  . Peanut-Containing Drug Products Shortness Of Breath and Swelling   Home medications Prior to Admission medications   Medication Sig Start Date End Date Taking? Authorizing Provider  aspirin 81 MG chewable tablet  Take one tablet by mouth once daily 02/21/14  Yes [name], DO  atorvastatin (LIPITOR) 10 MG tablet TAKE 1 TABLET (10 MG TOTAL) BY MOUTH AT BEDTIME. 12/31/14  Yes [name], DO  citalopram (CELEXA) 20 MG tablet Take 1 tablet (20 mg total) by mouth daily. 11/07/14  Yes Monica Carter, DO  Cranberry (ELLURA PO) Take 2 tablets by mouth at bedtime.   Yes Historical Provider, MD  Lactobacillus (ACIDOPHILUS) 100 MG CAPS Take 100 mg by mouth daily.   Yes Historical Provider, MD  Melatonin 10 MG TABS Take 10 mg by mouth at bedtime.    Yes Historical Provider, MD  pantoprazole (PROTONIX) 40 MG tablet Take one tablet by mouth once daily for stomach 10/01/14  Yes [name], DO  SEREVENT DISKUS 50 MCG/DOSE diskus inhaler TAKE 1 PUFF TWICE A DAY 03/01/15  Yes [name], DO  silver sulfADIAZINE (SILVADENE) 1 % cream Cleanse wound daily  then apply cream to the wound and cover with gauze Patient taking differently: Apply 1 application topically daily. Cleanse wound daily then apply cream to the wound and cover with gauze 03/13/15  Yes Estill Dooms, MD  sulfamethoxazole-trimethoprim (BACTRIM DS,SEPTRA DS) 800-160 MG tablet One twice daily to treat infection Patient taking differently: Take 1 tablet by mouth 2 (two) times daily.  03/13/15  Yes Estill Dooms, MD  traZODone (DESYREL) 50 MG tablet Take 1 tablet (50 mg total) by mouth at bedtime. 11/07/14  Yes [name], DO  trimethoprim (TRIMPEX) 100 MG tablet Take 1 tablet (100 mg total) by mouth every morning. 10/29/14  Yes [name], DO  losartan (COZAAR) 25 MG tablet TAKE 1 TABLET (25 MG TOTAL) BY MOUTH DAILY. 03/18/15   Lauree Chandler, NP  Tdap Durwin Reges) 5-2.5-18.5 LF-MCG/0.5 injection Inject 0.5 mLs into the muscle once. Patient not taking: Reported on 03/13/2015 11/07/14   [name], DO   Liver Function Tests  Recent Labs Lab 03/21/15 0410  AST 34  ALT 66*  ALKPHOS 147*  BILITOT 0.9  PROT 6.7  ALBUMIN 3.7   No results for input(s): LIPASE, AMYLASE in the last 168 hours. CBC  Recent Labs Lab 03/21/15 0410  WBC 8.3  NEUTROABS 4.2  HGB 13.5  HCT 39.1  MCV 86.1  PLT [id number]   Basic Metabolic Panel  Recent Labs Lab 03/22/15 0442  03/23/15 0608  03/24/15 0915 03/24/15 1424 03/24/15 1951 03/25/15 0549 03/25/15 1539 03/26/15 0550 03/26/15 1032  NA 122*  < > 125*  < > 126* 126* 128* 126* 130* 129* 128*  K 4.1  --  4.2  --   --   --  4.4 4.3 4.6 4.1 4.0  CL 91*  --  94*  --   --   --  96* 94* 96* 96* 97*  CO2 22  --  22  --   --   --  23 23 24 24 24   GLUCOSE 108*  --  101*  --   --   --  113* 93 109* 95 138*  BUN 15  --  15  --   --   --  15 13 15 13 14   CREATININE 0.77  --  0.96  --   --   --  0.85 0.85 0.96 0.87 0.92  CALCIUM 8.7*  --  8.9  --   --   --  8.6* 8.6* 8.8* 8.7* 8.8*  < > = values in this interval not displayed.  Filed Vitals:  03/25/15 2155 03/26/15 0459 03/26/15 0845 03/26/15 1335  BP: 134/68 131/80  128/62  Pulse: 85 73  80  Temp: 98.2 F (36.8 C) 97.4 F (36.3 C)  97.6 F (36.4 C)  TempSrc: Oral Oral  Oral  Resp: 18 18  18   Height:      Weight:      SpO2: 96% 93% 95% 98%   Exam Chron ill, blind WM , no distress No rash, cyanosis or gangrene Sclera anicteric, throat clear Eyes open, not tracking No jvd or bruits Chest clear throughout RRR no MRG ABd soft ntnd no ascites or hsm +bs GU normal male, has SP cath in lower mid abd draining clear urine MS muscle tone mod poor; no joint changes Ext no edema UE/ LE's Neuro no asterixis, mild-mod gen weakness, symmetric    Assessment: 1 Hyponatremia - in patient with relatively high Uosm, euvolemic on exam.  No evidence of CHF/ cirrhosis/ or neph syndrome.  TSH and cortisol are normal.  Celexa and Desyrel have been stopped.  Lowest Na here was 118.  NaCL tabs started yesterday.  He driinks lots of fluid to "flush my catheter", referring to his SP cath.  Euvolemic on exam.  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  He doesn't follow fluid restriction because of SP cath which needs to be "flushed", therefore his fluid intake is contributing to the issue.  ARB has been stopped - ARB/ ACEi don't cause low Na but can prevent recovery due to their effects in the kidney.   2 Hx meningitis c/b blindness/ deafness 3 HTN on norvasc now 4 Urosepsis - resolved, sp abx.    Plan - recommend avoiding SSRI and ARB/ ACEinhibitors indefinitely.  Would change salt tabs to 2 gm bid and continue this upon discharge. Salt tabs can be increased in the OP setting up to max 3 gm tid. He is asymptomatic and doesn't require hospitalization for Na 128.  He doesn't follow fluid restriction so goal Na should be > 125 if asymptomatic.  OK for discharge.  Will sign off.   Kelly Splinter MD [employer] pager [phone]    cell [phone] 03/26/2015, 4:15  PM

## 2015-03-26 NOTE — Progress Notes (Signed)
Initial Nutrition Assessment  DOCUMENTATION CODES:   Not applicable  INTERVENTION:  - Continue Regular diet with 800cc fluid restriction - RD will continue to monitor for needs  NUTRITION DIAGNOSIS:   Altered nutrition lab value related to acute illness as evidenced by other (see comment) (persistent hyponatremia with Na: 118-130 mmol/L.).  GOAL:   Patient will meet greater than or equal to 90% of their needs  MONITOR:   PO intake, Weight trends, Labs, I & O's  REASON FOR ASSESSMENT:   LOS  ASSESSMENT:   80 year old male with a past medical history bacterial meningitis with residual blindness, hearing loss, and neurogenic bladder s/p suprapubic catheter; who presents to the emergency department with confusion.   Pt seen for LOS (11 days). BMI indicates overweight status. Pt has been eating 50-95% of meals the past 3 days. Pt reports that for breakfast he had fruit, a piece of toast, several pieces of sausage and juice. He states for lunch he had fruit, juice, vegetables, and chicken. Pt denies swallowing difficulties unless he does not chew an item well; he denies overall difficulty with chewing. Pt states that he does need assistance during meals with cutting items and locating items on his plate. He states that he is able to see far distances well but has difficulty seeing things that are close to him. Pt states that appetite has been good recently.   He states hyponatremia is normal for him and that his normal serum Na is 130-135 mmol/L. Per chart review, pt has lost 2 lbs (1% body weight) in the past 3 months which is not significant for time frame. Prior to this, he lost 15 lbs (7.5% body weight) in 1 month which is significant for time frame; will continue to monitor weight trends for the possibility of fluid involvement. Pt currently on 800cc fluid restriction. No muscle or fat wasting noted.  Likely meeting needs on average. Medications reviewed; 3 grams NaCl TID. Labs  reviewed; Na: 128 mmol/L, Cl: 97 mmol/L, Ca: 8.8 mg/dL.   Diet Order:  Diet regular Room service appropriate?: Yes; Fluid consistency:: Thin; Fluid restriction:: Other (see comments)  Skin:  Reviewed, no issues  Last BM:  2/21  Height:   Ht Readings from Last 1 Encounters:  03/20/15 5\' 10"  (1.778 m)    Weight:   Wt Readings from Last 1 Encounters:  03/20/15 182 lb 8.7 oz (82.8 kg)    Ideal Body Weight:  75.45 kg (kg)  BMI:  Body mass index is 26.19 kg/(m^2).  Estimated Nutritional Needs:   Kcal:  1550-1750  Protein:  60-70 grams  Fluid:  1.8 L/day  EDUCATION NEEDS:   No education needs identified at this time     Ricardo [name], RD, LDN Inpatient Clinical Dietitian Pager # [phone] After hours/weekend pager # [phone]

## 2015-03-26 NOTE — Progress Notes (Signed)
Physical Therapy Treatment Patient Details Name: Ricardo [name] MRN: [id number] DOB: [birth year] Today's Date: 03/31/15    History of Present Illness 80 yo male admitted with acute encephalopathy, UTI. Hx of bacterial meningitis, blindness, hearing loss, neurogenic bladder, R foot cellulitis    PT Comments    Pt progressing well, good tolerance to gait distance today; family does not want HHPT; recommend 24hr assist/supervision  Follow Up Recommendations  No PT follow up;Supervision/Assistance - 24 hour (dtr declines HHPT f/u)     Equipment Recommendations  None recommended by PT    Recommendations for Other Services       Precautions / Restrictions Precautions Precautions: Fall Precaution Comments: high fall risk, blind, HOH (cochlear implant L ear) Restrictions Weight Bearing Restrictions: No    Mobility  Bed Mobility Overal bed mobility: Needs Assistance Bed Mobility: Supine to Sit     Supine to sit: Min guard;HOB elevated     General bed mobility comments: incr time, facilitation to bring LEs toward EOB  Transfers Overall transfer level: Needs assistance Equipment used: Rolling walker (2 wheeled) Transfers: Sit to/from Stand Sit to Stand: From elevated surface;Min assist         General transfer comment: cues for hand placement and to steady during transition  Ambulation/Gait Ambulation/Gait assistance: Min assist;Mod assist Ambulation Distance (Feet): 125 Feet Assistive device: Rolling walker (2 wheeled) Gait Pattern/deviations: Step-through pattern;Decreased stride length;Trunk flexed     General Gait Details: assist for balance and to maneuver RW, cues for pacing, safety, direction   Stairs            Wheelchair Mobility    Modified Rankin (Stroke Patients Only)       Balance           Standing balance support: Bilateral upper extremity supported Standing balance-Leahy Scale: Poor                      Cognition  Arousal/Alertness: Awake/alert Behavior During Therapy: WFL for tasks assessed/performed Overall Cognitive Status: Within Functional Limits for tasks assessed                      Exercises      General Comments        Pertinent Vitals/Pain Pain Assessment: No/denies pain    Home Living                      Prior Function            PT Goals (current goals can now be found in the care plan section) Acute Rehab PT Goals Patient Stated Goal: to get better.  PT Goal Formulation: With patient Time For Goal Achievement: 03/30/15 Potential to Achieve Goals: Fair Progress towards PT goals: Progressing toward goals    Frequency  Min 3X/week    PT Plan Current plan remains appropriate    Co-evaluation             End of Session Equipment Utilized During Treatment: Gait belt Activity Tolerance: Patient tolerated treatment well Patient left: in chair;with call bell/phone within reach;with chair alarm set     Time: 1344-1413 PT Time Calculation (min) (ACUTE ONLY): 29 min  Charges:  $Gait Training: 23-37 mins                    G Codes:      Crispin Vogel 2015-03-31, 2:15 PM

## 2015-03-26 NOTE — Care Management Important Message (Signed)
Important Message  Patient Details  Name: Ricardo [name] MRN: [id number] Date of Birth: May 05, 1935   Medicare Important Message Given:  Yes    Camillo Flaming 03/26/2015, 1:14 PMImportant Message  Patient Details  Name: Ricardo [name] MRN: [id number] Date of Birth: 01/17/1936   Medicare Important Message Given:  Yes    Camillo Flaming 03/26/2015, 1:14 PM

## 2015-03-27 LAB — BASIC METABOLIC PANEL
Anion gap: 10 (ref 5–15)
BUN: 13 mg/dL (ref 6–20)
CHLORIDE: 98 mmol/L — AB (ref 101–111)
CO2: 25 mmol/L (ref 22–32)
CREATININE: 0.85 mg/dL (ref 0.61–1.24)
Calcium: 9.1 mg/dL (ref 8.9–10.3)
GFR calc Af Amer: >60 mL/min (ref >60)
GFR calc non Af Amer: >60 mL/min (ref >60)
GLUCOSE: 98 mg/dL (ref 65–99)
Potassium: 4 mmol/L (ref 3.5–5.1)
SODIUM: 133 mmol/L — AB (ref 135–145)

## 2015-03-27 MED ORDER — SODIUM CHLORIDE 1 G PO TABS: 1.0000 g | ORAL_TABLET | Freq: Three times a day (TID) | ORAL | Status: DC

## 2015-03-27 MED ORDER — ZOLPIDEM TARTRATE 5 MG PO TABS: 5.0000 mg | ORAL_TABLET | Freq: Every evening | ORAL | Status: DC | PRN

## 2015-03-27 MED ORDER — AMLODIPINE BESYLATE 5 MG PO TABS: 5.0000 mg | ORAL_TABLET | Freq: Every day | ORAL | Status: DC

## 2015-03-27 MED ORDER — POLYETHYLENE GLYCOL 3350 17 G PO PACK: 17.0000 g | PACK | Freq: Every day | ORAL | Status: DC

## 2015-03-27 NOTE — Discharge Summary (Addendum)
[facility] Discharge Summary   Patient: Ricardo [name] [id number]   PCP: [name], DO DOB: [birth year]   Date of admission: 03/14/2015   Date of discharge: 03/27/2015    Discharge Diagnoses:  Principal Problem:   Acute encephalopathy Active Problems:   Essential hypertension   BPH (benign prostatic hypertrophy)   COPD (chronic obstructive pulmonary disease) (HCC)   Hyponatremia   Impaired vision in both eyes   Anemia of chronic disease, IDA   Gait disturbance   Nonspecific abnormal electrocardiogram (ECG) (EKG)   Urinary tract infection due to Proteus, suprapubic cath    Acute kidney injury [facility])   Recommendations for Outpatient Follow-up:  1. Please follow up with PCP in 1 week. 2. please maintain urology appointment   Follow-up Information    Follow up with [name], DO. Schedule an appointment as soon as possible for a visit in 1 week.   Specialty:  Internal Medicine   Why:  with sodium recheck   Contact information:   [place] [id number] [phone]       Follow up with Ailene Rud, MD. Schedule an appointment as soon as possible for a visit in 1 week.   Specialty:  Urology   Why:  catheter change   Contact information:   [place] [address] [phone]      Diet recommendation: regular diet  Activity: The patient is advised to gradually reintroduce usual activities.  Discharge Condition: good  History of present illness: As per the H and P dictated on admission, "[name] is a 80 year old male with a past medical history bacterial meningitis with residual blindness, hearing loss, and neurogenic bladder s/p suprapubic catheter; who presents to the emergency department with confusion. The patient lives with his daughter and she is present and she helps provide additional history. She had noticed that he was somewhat confused and was having increased issues with his balance to the point in which he  was unable to walk. She notes that the difficulty in walking had actually started a few days ago, but was not as severe as today. He normally is very with it mentally, but notes that he forgot to take a bath after being told to and saying things that didn't make much sense. He is right-handed and denies any weakness, shortness of breath, or chest pain. The daughter states that they had just gone to the doctor yesterday for an infection of his right fifth toe. They had taken cultures there and had started him on Bactrim for which she only taken 2 doses. She also notes that he has been going back almost weekly to Dr. [name] for issues with his suprapubic catheter because of sediment causing the catheter to the point which is not able to be flushed. They last went to his office last Friday he reports that the catheter was changed at that time.  In route with EMS patient was noted to have a temperature 101.79F and he was given 1000 mg of Tylenol. Upon admission patient was evaluated with urinalysis which was positive for large leukocyte, nitrites, many bacteria, and no epithelial cells. WBC was 9.2, hemoglobin 10.9, sodium 125, chloride 94, bicarbonate 20."  Hospital Course:  Summary of his active problems in the hospital is as following.  1. Acute encephalopathy Probably UTI. On Rocephin with transition to oral Keflex. Completed 7 day treatment.  Mentation improved and currently at his baseline. Blood cultures negative.  2. Eczema of the  right foot  Patient was initially started on Bactrim and then developed hyponatremia and it was stopped. Patient was placed on doxycycline but the redness improved and therefore it has been stopped as well. Currently do not see any edema or tenderness or redness.  3. Hyponatremia Sodium 132, nephrology recommended target sodium 125 for patient due to his need to drink fluids to ensure his catheter clearance. Patient is asymptomatic. likely SIADH. Patient  drinks a lot of fluid to flush his catheter as recommended by urology. Currently his Celexa and trazodone has been discontinued. Recommended moderating the fluid intake and reduced the salt tablet to daily.   4. Mild renal insufficiency Currently improved  monitor with fluid restriction.  5. Essential hypertension Discontinue losartan and place the patient on amlodipine.  6. dyslipidemia Continue Lipitor.  7. Cough Symptomatically treatment at present.  8. GAD, insomnia, From PCP's note 11/07/2014, "He was started on citalopram last month. Anxiety improved but still awakens at least 2 times per night and does not get but 4 hrs per night" Given the hyponatremia patient's trazodone which has been prescribed for insomnia has been discontinued and patient has been prescribed ambien instead as needed. Also celexa has been discontinued and patient was given xanax as needed, but since he did not have any increase requirement in the hospital, it was not continued.  All other chronic medical condition were stable during the hospitalization.  Patient was seen by physical therapy, who recommended home health, but the patient and family refused it. On the day of the discharge the patient's sodium improved, and no other acute medical condition were reported by patient. the patient was felt safe to be discharge at home with family.  Procedures and Results:  none   Consultations:  nephrology  DISCHARGE MEDICATION: Discharge Medication List as of 03/27/2015 10:52 AM    START taking these medications   Details  amLODipine (NORVASC) 5 MG tablet Take 1 tablet (5 mg total) by mouth daily., Starting 03/27/2015, Until Discontinued, Normal    polyethylene glycol (MIRALAX / GLYCOLAX) packet Take 17 g by mouth daily., Starting 03/27/2015, Until Discontinued, Normal    sodium chloride 1 g tablet Take 1 tablet (1 g total) by mouth 3 (three) times daily with meals., Starting 03/27/2015, Until  Discontinued, Normal    zolpidem (AMBIEN) 5 MG tablet Take 1 tablet (5 mg total) by mouth at bedtime as needed for sleep., Starting 03/27/2015, Until Discontinued, Print      CONTINUE these medications which have NOT CHANGED   Details  aspirin 81 MG chewable tablet Take one tablet by mouth once daily, Normal    atorvastatin (LIPITOR) 10 MG tablet TAKE 1 TABLET (10 MG TOTAL) BY MOUTH AT BEDTIME., Normal    Cranberry (ELLURA PO) Take 2 tablets by mouth at bedtime., Until Discontinued, Historical Med    Lactobacillus (ACIDOPHILUS) 100 MG CAPS Take 100 mg by mouth daily., Until Discontinued, Historical Med    Melatonin 10 MG TABS Take 10 mg by mouth at bedtime. , Until Discontinued, Historical Med    pantoprazole (PROTONIX) 40 MG tablet Take one tablet by mouth once daily for stomach, Normal    SEREVENT DISKUS 50 MCG/DOSE diskus inhaler TAKE 1 PUFF TWICE A DAY, Normal    silver sulfADIAZINE (SILVADENE) 1 % cream Cleanse wound daily then apply cream to the wound and cover with gauze, Normal      STOP taking these medications     citalopram (CELEXA) 20 MG tablet  sulfamethoxazole-trimethoprim (BACTRIM DS,SEPTRA DS) 800-160 MG tablet      traZODone (DESYREL) 50 MG tablet      trimethoprim (TRIMPEX) 100 MG tablet      losartan (COZAAR) 25 MG tablet      Tdap (BOOSTRIX) 5-2.5-18.5 LF-MCG/0.5 injection        Allergies  Allergen Reactions  . Peanut Oil Shortness Of Breath and Swelling    Itching  . Peanut-Containing Drug Products Shortness Of Breath and Swelling   Discharge Instructions    Diet general    Complete by:  As directed      Discharge instructions    Complete by:  As directed   It is important that you read following instructions as well as go over your medication list with RN to help you understand your care after this hospitalization.  Discharge Instructions: Please follow-up with PCP in one week with sodium recheck  Please request your primary care  physician to go over all Hospital Tests and Procedure/Radiological results at the follow up,  Please get all Hospital records sent to your PCP by signing hospital release before you go home.   Physician or a Neurologist and advised to do so again. Do not take more than prescribed Pain, Sleep and Anxiety Medications. You were cared for by a hospitalist during your hospital stay. If you have any questions about your discharge medications or the care you received while you were in the hospital after you are discharged, you can call the unit and ask to speak with the hospitalist on call if the hospitalist that took care of you is not available.  Once you are discharged, your primary care physician will handle any further medical issues. Please note that NO REFILLS for any discharge medications will be authorized once you are discharged, as it is imperative that you return to your primary care physician (or establish a relationship with a primary care physician if you do not have one) for your aftercare needs so that they can reassess your need for medications and monitor your lab values. You Must read complete instructions/literature along with all the possible adverse reactions/side effects for all the Medicines you take and that have been prescribed to you. Take any new Medicines after you have completely understood and accept all the possible adverse reactions/side effects. Wear Seat belts while driving. If you have smoked or chewed Tobacco in the last 2 yrs please stop smoking and/or stop any Recreational drug use.     Increase activity slowly    Complete by:  As directed           Discharge Exam: Filed Weights   03/20/15 0500  Weight: 82.8 kg (182 lb 8.7 oz)   Filed Vitals:   03/26/15 2055 03/27/15 0528  BP: 136/67 146/73  Pulse: 85 78  Temp: 97.5 F (36.4 C) 97.3 F (36.3 C)  Resp: 18 18   General: Appear in no distress, no Rash; Oral Mucosa moist. Cardiovascular: S1 and S2 Present,  no Murmur, no JVD Respiratory: Bilateral Air entry present and Clear to Auscultation, no Crackles, no wheezes Abdomen: Bowel Sound present, Soft and no tenderness Extremities: no Pedal edema, no calf tenderness Neurology: Grossly no focal neuro deficit.  The results of significant diagnostics from this hospitalization (including imaging, microbiology, ancillary and laboratory) are listed below for reference.    Significant Diagnostic Studies: Dg Chest 2 View  03/14/2015  CLINICAL DATA:  Fever.  Altered mental status. EXAM: CHEST  2 VIEW COMPARISON:  04/21/2014  FINDINGS: Mild-to-moderate enlargement of the cardiopericardial silhouette with evidence of coronary artery atherosclerosis. The patient is rotated to the left on today's radiograph, reducing diagnostic sensitivity and specificity. Thoracic spondylosis. No blunting of the costophrenic angles identified. Eventration of the left posterior hemidiaphragm. IMPRESSION: 1. Mild to moderate enlargement of the cardiopericardial silhouette, without edema. No pleural effusion. 2. Thoracic spondylosis. Electronically Signed   By: Van Clines M.D.   On: 03/14/2015 17:12   Ct Head Wo Contrast  03/14/2015  CLINICAL DATA:  80 year old male with altered mental status and weakness. EXAM: CT HEAD WITHOUT CONTRAST TECHNIQUE: Contiguous axial images were obtained from the base of the skull through the vertex without intravenous contrast. COMPARISON:  None. FINDINGS: Evaluation is limited due to to streak artifact caused by metallic hearing aid. There is mild prominence of the ventricles and sulci compatible with age-related atrophy. Mild to moderate periventricular and deep white matter chronic microvascular ischemic changes noted. There is no acute intracranial hemorrhage. No mass effect or midline shift. There is postsurgical changes of partial left mastoid resection. There is partial opacification of left mastoid air cells. The remainder of the paranasal  sinuses the right mastoid air cells are well aerated. The calvarium is intact. IMPRESSION: No acute intracranial hemorrhage. Mild to moderate age-related atrophy and chronic microvascular ischemic disease. If symptoms persist and there are no contraindications, MRI may provide better evaluation if clinically indicated Electronically Signed   By: Anner Crete M.D.   On: 03/14/2015 20:12   Dg Foot Complete Right  03/14/2015  CLINICAL DATA:  Fever today with pain around the fourth and fifth toes of right foot. EXAM: RIGHT FOOT COMPLETE - 3+ VIEW COMPARISON:  None. FINDINGS: There is no evidence of fracture or dislocation. There is diffuse osteopenia. Degenerative joint changes of the right mid to the distal foot are noted. The soft tissues are normal. IMPRESSION: No acute abnormality identified. Osteoarthritic changes of right foot. Electronically Signed   By: Abelardo Diesel M.D.   On: 03/14/2015 17:12    Microbiology: No results found for this or any previous visit (from the past 240 hour(s)).   Labs: CBC:  Recent Labs Lab 03/21/15 0410  WBC 8.3  NEUTROABS 4.2  HGB 13.5  HCT 39.1  MCV 86.1  PLT [id number]   Basic Metabolic Panel:  Recent Labs Lab 03/22/15 0442  03/25/15 0549 03/25/15 1539 03/26/15 0550 03/26/15 1032 03/27/15 0538  NA 122*  < > 126* 130* 129* 128* 133*  K 4.1  < > 4.3 4.6 4.1 4.0 4.0  CL 91*  < > 94* 96* 96* 97* 98*  CO2 22  < > 23 24 24 24 25   GLUCOSE 108*  < > 93 109* 95 138* 98  BUN 15  < > 13 15 13 14 13   CREATININE 0.77  < > 0.85 0.96 0.87 0.92 0.85  CALCIUM 8.7*  < > 8.6* 8.8* 8.7* 8.8* 9.1  MG 1.9  --   --   --   --   --   --   < > = values in this interval not displayed. Liver Function Tests:  Recent Labs Lab 03/21/15 0410  AST 34  ALT 66*  ALKPHOS 147*  BILITOT 0.9  PROT 6.7  ALBUMIN 3.7   BNP (last 3 results)  Recent Labs  03/20/15 0856  BNP 34.0   CBG: No results for input(s): GLUCAP in the last 168 hours. Time spent: 30  minutes  Signed:  Berle Mull  [facility]  03/27/2015, 12:28 PM

## 2015-03-27 NOTE — Progress Notes (Signed)
Pt discharged home.  VSS. IV d/c with catheter intact.  Discharge instructions with prescriptions provided.  Pt daughter verbalized understanding.

## 2015-03-28 DIAGNOSIS — G4700 Insomnia, unspecified: Secondary | ICD-10-CM | POA: Diagnosis present

## 2015-03-28 DIAGNOSIS — F39 Unspecified mood [affective] disorder: Secondary | ICD-10-CM | POA: Diagnosis present

## 2015-03-29 ENCOUNTER — Encounter: Payer: Self-pay | Admitting: Internal Medicine

## 2015-03-29 ENCOUNTER — Ambulatory Visit (INDEPENDENT_AMBULATORY_CARE_PROVIDER_SITE_OTHER): Payer: Medicare HMO | Admitting: Internal Medicine

## 2015-03-29 VITALS — BP 128/60 | HR 81 | Temp 98.0°F | Resp 20

## 2015-03-29 DIAGNOSIS — Z9289 Personal history of other medical treatment: Secondary | ICD-10-CM | POA: Diagnosis not present

## 2015-03-29 DIAGNOSIS — I10 Essential (primary) hypertension: Secondary | ICD-10-CM

## 2015-03-29 DIAGNOSIS — L03115 Cellulitis of right lower limb: Secondary | ICD-10-CM

## 2015-03-29 DIAGNOSIS — Z978 Presence of other specified devices: Secondary | ICD-10-CM

## 2015-03-29 DIAGNOSIS — N390 Urinary tract infection, site not specified: Secondary | ICD-10-CM | POA: Diagnosis not present

## 2015-03-29 DIAGNOSIS — J449 Chronic obstructive pulmonary disease, unspecified: Secondary | ICD-10-CM

## 2015-03-29 DIAGNOSIS — E871 Hypo-osmolality and hyponatremia: Secondary | ICD-10-CM | POA: Diagnosis not present

## 2015-03-29 DIAGNOSIS — Z960 Presence of urogenital implants: Secondary | ICD-10-CM

## 2015-03-29 DIAGNOSIS — H543 Unqualified visual loss, both eyes: Secondary | ICD-10-CM

## 2015-03-29 DIAGNOSIS — R5381 Other malaise: Secondary | ICD-10-CM | POA: Diagnosis not present

## 2015-03-29 DIAGNOSIS — F411 Generalized anxiety disorder: Secondary | ICD-10-CM | POA: Diagnosis not present

## 2015-03-29 DIAGNOSIS — R69 Illness, unspecified: Secondary | ICD-10-CM | POA: Diagnosis not present

## 2015-03-29 MED ORDER — CEFDINIR 300 MG PO CAPS: 300.0000 mg | ORAL_CAPSULE | Freq: Two times a day (BID) | ORAL | Status: DC

## 2015-03-29 NOTE — Patient Instructions (Addendum)
Start omnicef 300mg  2 times daily x 10 days. Take probiotic daily while on antibioric  Will call with lab results and referral appt  Home health referral for PT  Continue current medications as ordered  Follow up in 1 month for recurrent UTI

## 2015-03-29 NOTE — Progress Notes (Signed)
Patient ID: Ricardo [name], male   DOB: 06-21-1935, 80 y.o.   MRN: [id number]    Location:    [facility]    Place of Service:   OFFICE  Chief Complaint  Patient presents with  . Follow-up    [name] Follow-up, 3 month routine visit, Labs printed  . OTHER    Daughter in room with patient    HPI:  80 yo male seen today for hospital f/u (2/9-2/22). He was dx with acute encephalopathy, hyponatremia, AKI, Proteus UTI/suprapubic cath, HTN, COPD, abnormal ECG, AOCD, BPH. Tmax 101.44F in ED. He is followed by urology for suprapubic cath. Prior to admission he was tx for toe infection with bactrim. UTI tx with rocephin --> keflex and completed 7 days. BC neg. Right foot infection tx with bactrim but hyponatremia developed and med changed to doxy. Redness in foot resolved and doxy stopped. Na as low as 118, nephro recommended target or 125. D/c Na 133. 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 celexa and trazodone stopped. Losartan stopped and placed on amlodipine. ambien Rx for insomnia  Today he reports pain and swelling in right 5th toe. Wound cx (+) proteus mirabalis prior to hospital admission. He is applying topical silvadene. He has not seen urology yet. He has a chronic foley. He would like a 2nd opinion regarding recurrent UTI. He has a poor appetite since d/c but is eating better. No f/c. He is taking cranberry po daily.  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 He continues to have difficulty sleeping. GERD sx's stable on protonix. Constipation stable on miralax. He takes probiotic daily. Cholesterol stable on lipitor. Daughter is present   Past Medical History  Diagnosis Date  . Rotator cuff tear     right  . Hypertension   . High cholesterol   . Meningitis   . Enlarged prostate   . 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 Deaf     due to meningitis  . Loss of balance     due to meningitis  . Unable to walk     due to meningitis  . Eczema   . Cancer [facility]) 1956    left testicle  . Skin  cancer   . Neurogenic bladder     History of    Past Surgical History  Procedure Laterality Date  . Appendectomy    . Prostate surgery      biopsy x 2  . Tonsillectomy      childhood  . Testicle removal Left     October 1956  . Cochlear implant    . Cystoscopy with litholapaxy N/A 05/14/2014    Procedure: CYSTOSCOPY WITH LITHOLAPAXY;  Surgeon: [name], MD;  Location: [facility];  Service: Urology;  Laterality: N/A;  . Insertion of suprapubic catheter N/A 05/14/2014    Procedure: INSERTION OF SUPRAPUBIC CATHETER;  Surgeon: [name], MD;  Location: [facility];  Service: Urology;  Laterality: N/A;    Patient Care Team: [name], DO as PCP - General (Internal Medicine) [name], MD as Consulting Physician (Urology)  Social History   Social History  . Marital Status: Widowed    Spouse Name: N/A  . Number of Children: N/A  . Years of Education: N/A   Occupational History  . Not on file.   Social History Main Topics  . Smoking status: Former Smoker -- 10 years    Types: Cigarettes    Start date: 02/10/1955  Quit date: 02/09/1965  . Smokeless tobacco: Never Used  . Alcohol Use: No  . Drug Use: No  . Sexual Activity: Not on file   Other Topics Concern  . Not on file   Social History Narrative   Diet- Fairly balanced   Caffeine- Yes, Coffee   Married- 1964, [place] living now with daughter, all on the first floor   Pets-4, dog and Radiation protection practitioner, English as a second language teacher for [employer], Chief Financial Officer, Freight forwarder   Exercise- Yes, Rehab 3 times a week   Living will- Yes   DNR- Yes   POA/HPOA- Yes           reports that he quit smoking about 50 years ago. His smoking use included Cigarettes. He started smoking about 60 years ago. He quit after 10 years of use. He has never used smokeless tobacco. He reports that he does not drink alcohol or use illicit drugs.  Allergies  Allergen Reactions  . Peanut Oil Shortness Of Breath  and Swelling    Itching  . Peanut-Containing Drug Products Shortness Of Breath and Swelling    Medications: Patient's Medications  New Prescriptions   No medications on file  Previous Medications   AMLODIPINE (NORVASC) 5 MG TABLET    Take 1 tablet (5 mg total) by mouth daily.   ASPIRIN 81 MG CHEWABLE TABLET    Take one tablet by mouth once daily   ATORVASTATIN (LIPITOR) 10 MG TABLET    TAKE 1 TABLET (10 MG TOTAL) BY MOUTH AT BEDTIME.   CRANBERRY (ELLURA PO)    Take 2 tablets by mouth at bedtime.   LACTOBACILLUS (ACIDOPHILUS) 100 MG CAPS    Take 100 mg by mouth daily.   MELATONIN 10 MG TABS    Take 10 mg by mouth at bedtime.    PANTOPRAZOLE (PROTONIX) 40 MG TABLET    Take one tablet by mouth once daily for stomach   POLYETHYLENE GLYCOL (MIRALAX / GLYCOLAX) PACKET    Take 17 g by mouth daily.   SEREVENT DISKUS 50 MCG/DOSE DISKUS INHALER    TAKE 1 PUFF TWICE A DAY   SILVER SULFADIAZINE (SILVADENE) 1 % CREAM    Cleanse wound daily then apply cream to the wound and cover with gauze   SODIUM CHLORIDE 1 G TABLET    Take 1 tablet (1 g total) by mouth 3 (three) times daily with meals.   ZOLPIDEM (AMBIEN) 5 MG TABLET    Take 1 tablet (5 mg total) by mouth at bedtime as needed for sleep.  Modified Medications   No medications on file  Discontinued Medications   No medications on file    Review of Systems  Constitutional: Positive for appetite change.  Genitourinary: Positive for difficulty urinating.  Musculoskeletal: Positive for joint swelling, arthralgias and gait problem.  Skin: Positive for rash and wound.  Neurological: Positive for weakness.  All other systems reviewed and are negative.   Filed Vitals:   03/29/15 1034  BP: 128/60  Pulse: 81  Temp: 98 F (36.7 C)  TempSrc: Oral  Resp: 20  SpO2: 97%   There is no weight on file to calculate BMI.  Physical Exam  Constitutional: He is oriented to person, place, and time. He appears well-developed.  Frail appearing in NAD,  sitting in w/c  HENT:  Mouth/Throat: Oropharynx is clear and moist.  Eyes: Pupils are equal, round, and reactive to light. No scleral icterus.  Blind in OU  Neck: Neck supple. Carotid  bruit is not present. No thyromegaly present.  Cardiovascular: Normal rate, regular rhythm and intact distal pulses.  Exam reveals no gallop and no friction rub.   Murmur (1/6 SEM) heard. no distal LE swelling. No calf TTP  Pulmonary/Chest: Effort normal and breath sounds normal. He has no wheezes. He has no rales. He exhibits no tenderness.  Abdominal: Soft. Bowel sounds are normal. He exhibits no distension, no abdominal bruit, no pulsatile midline mass and no mass. There is no tenderness. There is no rebound and no guarding.  Genitourinary:  Suprapubic cath intact with foley DTG with sediment changes noted  Musculoskeletal: He exhibits edema and tenderness.  Lymphadenopathy:    He has no cervical adenopathy.  Neurological: He is alert and oriented to person, place, and time.  Skin: Skin is warm and dry. Rash (right 5th toe redness and swelling extending proximally to metatarsal; increased warmth to touch; no d/c) noted.  Psychiatric: He has a normal mood and affect. His behavior is normal. Thought content normal.     Labs reviewed: Admission on 03/14/2015, Discharged on 03/27/2015  No results displayed because visit has over 200 results.  CBC Latest Ref Rng 03/21/2015 03/17/2015 03/16/2015  WBC 4.0 - 10.5 K/uL 8.3 7.9 7.3  Hemoglobin 13.0 - 17.0 g/dL 13.5 11.2(L) 11.5(L)  Hematocrit 39.0 - 52.0 % 39.1 31.9(L) 34.0(L)  Platelets 150 - 400 K/uL 334 208 201    CMP Latest Ref Rng 03/27/2015 03/26/2015 03/26/2015  Glucose 65 - 99 mg/dL 98 138(H) 95  BUN 6 - 20 mg/dL 13 14 13   Creatinine 0.61 - 1.24 mg/dL 0.85 0.92 0.87  Sodium 135 - 145 mmol/L 133(L) 128(L) 129(L)  Potassium 3.5 - 5.1 mmol/L 4.0 4.0 4.1  Chloride 101 - 111 mmol/L 98(L) 97(L) 96(L)  CO2 22 - 32 mmol/L 25 24 24   Calcium 8.9 - 10.3 mg/dL  9.1 8.8(L) 8.7(L)       Dg Chest 2 View  03/14/2015  CLINICAL DATA:  Fever.  Altered mental status. EXAM: CHEST  2 VIEW COMPARISON:  04/21/2014 FINDINGS: Mild-to-moderate enlargement of the cardiopericardial silhouette with evidence of coronary artery atherosclerosis. The patient is rotated to the left on today's radiograph, reducing diagnostic sensitivity and specificity. Thoracic spondylosis. No blunting of the costophrenic angles identified. Eventration of the left posterior hemidiaphragm. IMPRESSION: 1. Mild to moderate enlargement of the cardiopericardial silhouette, without edema. No pleural effusion. 2. Thoracic spondylosis. Electronically Signed   By: Van Clines M.D.   On: 03/14/2015 17:12   Ct Head Wo Contrast  03/14/2015  CLINICAL DATA:  80 year old male with altered mental status and weakness. EXAM: CT HEAD WITHOUT CONTRAST TECHNIQUE: Contiguous axial images were obtained from the base of the skull through the vertex without intravenous contrast. COMPARISON:  None. FINDINGS: Evaluation is limited due to to streak artifact caused by metallic hearing aid. There is mild prominence of the ventricles and sulci compatible with age-related atrophy. Mild to moderate periventricular and deep white matter chronic microvascular ischemic changes noted. There is no acute intracranial hemorrhage. No mass effect or midline shift. There is postsurgical changes of partial left mastoid resection. There is partial opacification of left mastoid air cells. The remainder of the paranasal sinuses the right mastoid air cells are well aerated. The calvarium is intact. IMPRESSION: No acute intracranial hemorrhage. Mild to moderate age-related atrophy and chronic microvascular ischemic disease. If symptoms persist and there are no contraindications, MRI may provide better evaluation if clinically indicated Electronically Signed   By: Anner Crete  M.D.   On: 03/14/2015 20:12   Dg Foot Complete Right  03/14/2015   CLINICAL DATA:  Fever today with pain around the fourth and fifth toes of right foot. EXAM: RIGHT FOOT COMPLETE - 3+ VIEW COMPARISON:  None. FINDINGS: There is no evidence of fracture or dislocation. There is diffuse osteopenia. Degenerative joint changes of the right mid to the distal foot are noted. The soft tissues are normal. IMPRESSION: No acute abnormality identified. Osteoarthritic changes of right foot. Electronically Signed   By: Abelardo Diesel M.D.   On: 03/14/2015 17:12     Assessment/Plan   ICD-9-CM ICD-10-CM   1. Cellulitis of right lower extremity - recurrence; last grew Proteus mirablis 682.6 L03.115 CBC with Differential     cefdinir (OMNICEF) 300 MG capsule  2. Foley catheter in place V45.89 Z92.89 Ambulatory referral to Urology  3. Recurrent UTI 599.0 N39.0 Ambulatory referral to Urology     Ambulatory referral to [place]  4. Chronic obstructive pulmonary disease, unspecified COPD, unspecified chronic bronchitis type 496 J44.9   5. Essential hypertension 401.9 I10   6. Generalized anxiety disorder 300.02 F41.1   7. Impaired vision in both eyes 369.3 H54.3 Ambulatory referral to [place]  8. Hyponatremia 276.1 [id number] Basic Metabolic Panel     Ambulatory referral to Home Health  9. Physical deconditioning 799.3 R53.81 Ambulatory referral to [place] omnicef 300mg  2 times daily x 10 days. Take probiotic daily while on antibioric  Will call with lab results and referral appt  Home health referral for PT  Continue current medications as ordered  Follow up in 1 month for recurrent UTI   [facility] S. [name]  [facility] [address] [place], [place] [address] [phone] Cell (Monday-Friday 8 AM - 5 PM) [phone] After 5 PM and follow prompts

## 2015-03-30 LAB — CBC WITH DIFFERENTIAL/PLATELET
BASOS ABS: 0.1 10*3/uL (ref 0.0–0.2)
BASOS: 1 %
EOS (ABSOLUTE): 0.2 10*3/uL (ref 0.0–0.4)
Eos: 2 %
Hematocrit: 37.1 % — ABNORMAL LOW (ref 37.5–51.0)
Hemoglobin: 12.7 g/dL (ref 12.6–17.7)
IMMATURE GRANS (ABS): 0.1 10*3/uL (ref 0.0–0.1)
IMMATURE GRANULOCYTES: 1 %
LYMPHS: 19 %
Lymphocytes Absolute: 1.9 10*3/uL (ref 0.7–3.1)
MCH: 29.5 pg (ref 26.6–33.0)
MCHC: 34.2 g/dL (ref 31.5–35.7)
MCV: 86 fL (ref 79–97)
Monocytes Absolute: 1.4 10*3/uL — ABNORMAL HIGH (ref 0.1–0.9)
Monocytes: 14 %
NEUTROS PCT: 63 %
Neutrophils Absolute: 6.3 10*3/uL (ref 1.4–7.0)
PLATELETS: 396 10*3/uL — AB (ref 150–379)
RBC: 4.31 x10E6/uL (ref 4.14–5.80)
RDW: 13 % (ref 12.3–15.4)
WBC: 9.9 10*3/uL (ref 3.4–10.8)

## 2015-03-30 LAB — BASIC METABOLIC PANEL
BUN/Creatinine Ratio: 20 (ref 10–22)
BUN: 18 mg/dL (ref 8–27)
CALCIUM: 8.8 mg/dL (ref 8.6–10.2)
CO2: 19 mmol/L (ref 18–29)
Chloride: 94 mmol/L — ABNORMAL LOW (ref 96–106)
Creatinine, Ser: 0.88 mg/dL (ref 0.76–1.27)
GFR calc Af Amer: 96 mL/min/{1.73_m2} (ref >59)
GFR, EST NON AFRICAN AMERICAN: 83 mL/min/{1.73_m2} (ref >59)
Glucose: 90 mg/dL (ref 65–99)
POTASSIUM: 4.1 mmol/L (ref 3.5–5.2)
Sodium: 134 mmol/L (ref 134–144)

## 2015-04-01 ENCOUNTER — Telehealth: Payer: Self-pay | Admitting: *Deleted

## 2015-04-01 DIAGNOSIS — Z87448 Personal history of other diseases of urinary system: Secondary | ICD-10-CM | POA: Diagnosis not present

## 2015-04-01 DIAGNOSIS — Z9359 Other cystostomy status: Secondary | ICD-10-CM | POA: Diagnosis not present

## 2015-04-01 DIAGNOSIS — B964 Proteus (mirabilis) (morganii) as the cause of diseases classified elsewhere: Secondary | ICD-10-CM | POA: Diagnosis not present

## 2015-04-01 DIAGNOSIS — N4889 Other specified disorders of penis: Secondary | ICD-10-CM | POA: Diagnosis not present

## 2015-04-01 DIAGNOSIS — N3020 Other chronic cystitis without hematuria: Secondary | ICD-10-CM | POA: Diagnosis not present

## 2015-04-01 DIAGNOSIS — N390 Urinary tract infection, site not specified: Secondary | ICD-10-CM | POA: Diagnosis not present

## 2015-04-01 MED ORDER — SODIUM CHLORIDE 1 G PO TABS: 1.0000 g | ORAL_TABLET | Freq: Three times a day (TID) | ORAL | Status: DC

## 2015-04-01 NOTE — Telephone Encounter (Signed)
Patient daughter, [name] notified and agreed.

## 2015-04-01 NOTE — Telephone Encounter (Signed)
Yes continue sodium tabs. Please fax RF to pharmacy.  Will reck BMP next OV and if stable, will d/c med

## 2015-04-01 NOTE — Telephone Encounter (Signed)
Daughter, [name] called and wanted to know if patient should continue the Sodium Chloride and if so he needs a refill faxed to pharmacy. Please Advise.

## 2015-04-02 ENCOUNTER — Telehealth: Payer: Self-pay | Admitting: *Deleted

## 2015-04-02 MED ORDER — TRAZODONE HCL 50 MG PO TABS: ORAL_TABLET | ORAL | Status: DC

## 2015-04-02 MED ORDER — CITALOPRAM HYDROBROMIDE 20 MG PO TABS: ORAL_TABLET | ORAL | Status: DC

## 2015-04-02 NOTE — Telephone Encounter (Signed)
Ok to resume meds as ordered. F/u as scheduled

## 2015-04-02 NOTE — Telephone Encounter (Signed)
Medication List updated and [name] notified and agreed.

## 2015-04-02 NOTE — Telephone Encounter (Signed)
Ricardo [name], daughter called and wanted to know if patient can go back on his Citalopram and Trazodone. Was taken off of these in the hospital, but patient is now having increased anxiety and not sleeping. Ambien is not working. Please Advise.  (can leave voicemail)

## 2015-04-05 DIAGNOSIS — R102 Pelvic and perineal pain: Secondary | ICD-10-CM | POA: Diagnosis not present

## 2015-04-05 DIAGNOSIS — R338 Other retention of urine: Secondary | ICD-10-CM | POA: Diagnosis not present

## 2015-04-05 DIAGNOSIS — N3020 Other chronic cystitis without hematuria: Secondary | ICD-10-CM | POA: Diagnosis not present

## 2015-04-05 DIAGNOSIS — Z87448 Personal history of other diseases of urinary system: Secondary | ICD-10-CM | POA: Diagnosis not present

## 2015-04-06 DIAGNOSIS — R2681 Unsteadiness on feet: Secondary | ICD-10-CM | POA: Diagnosis not present

## 2015-04-06 DIAGNOSIS — N400 Enlarged prostate without lower urinary tract symptoms: Secondary | ICD-10-CM | POA: Diagnosis not present

## 2015-04-06 DIAGNOSIS — J449 Chronic obstructive pulmonary disease, unspecified: Secondary | ICD-10-CM | POA: Diagnosis not present

## 2015-04-06 DIAGNOSIS — R296 Repeated falls: Secondary | ICD-10-CM | POA: Diagnosis not present

## 2015-04-06 DIAGNOSIS — N319 Neuromuscular dysfunction of bladder, unspecified: Secondary | ICD-10-CM | POA: Diagnosis not present

## 2015-04-06 DIAGNOSIS — L03115 Cellulitis of right lower limb: Secondary | ICD-10-CM | POA: Diagnosis not present

## 2015-04-06 DIAGNOSIS — G629 Polyneuropathy, unspecified: Secondary | ICD-10-CM | POA: Diagnosis not present

## 2015-04-06 DIAGNOSIS — B964 Proteus (mirabilis) (morganii) as the cause of diseases classified elsewhere: Secondary | ICD-10-CM | POA: Diagnosis not present

## 2015-04-06 DIAGNOSIS — N390 Urinary tract infection, site not specified: Secondary | ICD-10-CM | POA: Diagnosis not present

## 2015-04-06 DIAGNOSIS — I10 Essential (primary) hypertension: Secondary | ICD-10-CM | POA: Diagnosis not present

## 2015-04-10 ENCOUNTER — Other Ambulatory Visit: Payer: Self-pay | Admitting: Urology

## 2015-04-10 DIAGNOSIS — G629 Polyneuropathy, unspecified: Secondary | ICD-10-CM | POA: Diagnosis not present

## 2015-04-10 DIAGNOSIS — N400 Enlarged prostate without lower urinary tract symptoms: Secondary | ICD-10-CM | POA: Diagnosis not present

## 2015-04-10 DIAGNOSIS — B964 Proteus (mirabilis) (morganii) as the cause of diseases classified elsewhere: Secondary | ICD-10-CM | POA: Diagnosis not present

## 2015-04-10 DIAGNOSIS — R2681 Unsteadiness on feet: Secondary | ICD-10-CM | POA: Diagnosis not present

## 2015-04-10 DIAGNOSIS — N319 Neuromuscular dysfunction of bladder, unspecified: Secondary | ICD-10-CM | POA: Diagnosis not present

## 2015-04-10 DIAGNOSIS — I10 Essential (primary) hypertension: Secondary | ICD-10-CM | POA: Diagnosis not present

## 2015-04-10 DIAGNOSIS — N390 Urinary tract infection, site not specified: Secondary | ICD-10-CM | POA: Diagnosis not present

## 2015-04-10 DIAGNOSIS — R296 Repeated falls: Secondary | ICD-10-CM | POA: Diagnosis not present

## 2015-04-10 DIAGNOSIS — J449 Chronic obstructive pulmonary disease, unspecified: Secondary | ICD-10-CM | POA: Diagnosis not present

## 2015-04-10 DIAGNOSIS — L03115 Cellulitis of right lower limb: Secondary | ICD-10-CM | POA: Diagnosis not present

## 2015-04-11 DIAGNOSIS — N400 Enlarged prostate without lower urinary tract symptoms: Secondary | ICD-10-CM | POA: Diagnosis not present

## 2015-04-11 DIAGNOSIS — N319 Neuromuscular dysfunction of bladder, unspecified: Secondary | ICD-10-CM | POA: Diagnosis not present

## 2015-04-11 DIAGNOSIS — J449 Chronic obstructive pulmonary disease, unspecified: Secondary | ICD-10-CM | POA: Diagnosis not present

## 2015-04-11 DIAGNOSIS — N390 Urinary tract infection, site not specified: Secondary | ICD-10-CM | POA: Diagnosis not present

## 2015-04-11 DIAGNOSIS — G629 Polyneuropathy, unspecified: Secondary | ICD-10-CM | POA: Diagnosis not present

## 2015-04-11 DIAGNOSIS — R296 Repeated falls: Secondary | ICD-10-CM | POA: Diagnosis not present

## 2015-04-11 DIAGNOSIS — I10 Essential (primary) hypertension: Secondary | ICD-10-CM | POA: Diagnosis not present

## 2015-04-11 DIAGNOSIS — B964 Proteus (mirabilis) (morganii) as the cause of diseases classified elsewhere: Secondary | ICD-10-CM | POA: Diagnosis not present

## 2015-04-11 DIAGNOSIS — R2681 Unsteadiness on feet: Secondary | ICD-10-CM | POA: Diagnosis not present

## 2015-04-11 DIAGNOSIS — L03115 Cellulitis of right lower limb: Secondary | ICD-10-CM | POA: Diagnosis not present

## 2015-04-11 NOTE — Patient Instructions (Signed)
Ricardo [name]  04/11/2015   Your procedure is scheduled on: 04/22/2015    Report to [facility] Main  Entrance take [place]  elevators to 3rd floor to  [place] at    0830 AM.  Call this number if you have problems the morning of surgery [phone]   Remember: ONLY 1 PERSON MAY GO WITH YOU TO SHORT STAY TO GET  READY MORNING OF [place].  Do not eat food or drink liquids :After Midnight.     Take these medicines the morning of surgery with A SIP OF WATER: Amlodipinei ( Norvasc), Celexa ( Citalopram), Pantoprazole( Protonix), Serevent Inhaler and bring                                 You may not have any metal on your body including hair pins and              piercings  Do not wear jewelry, , lotions, powders or perfumes, deodorant                          Men may shave face and neck.   Do not bring valuables to the hospital. [address].  Contacts, dentures or bridgework may not be worn into surgery.       Patients discharged the day of surgery will not be allowed to drive home.  Name and phone number of your driver:  Special Instructions: coughing and deep breathing exercises,leg exercises               Please read over the following fact sheets you were given: _____________________________________________________________________             [facility] - Preparing for Surgery Before surgery, you can play an important role.  Because skin is not sterile, your skin needs to be as free of germs as possible.  You can reduce the number of germs on your skin by washing with CHG (chlorahexidine gluconate) soap before surgery.  CHG is an antiseptic cleaner which kills germs and bonds with the skin to continue killing germs even after washing. Please DO NOT use if you have an allergy to CHG or antibacterial soaps.  If your skin becomes reddened/irritated stop using the CHG and inform your nurse when you  arrive at Short Stay. Do not shave (including legs and underarms) for at least 48 hours prior to the first CHG shower.  You may shave your face/neck. Please follow these instructions carefully:  1.  Shower with CHG Soap the night before surgery and the  morning of Surgery.  2.  If you choose to wash your hair, wash your hair first as usual with your  normal  shampoo.  3.  After you shampoo, rinse your hair and body thoroughly to remove the  shampoo.                           4.  Use CHG as you would any other liquid soap.  You can apply chg directly  to the skin and wash  Gently with a scrungie or clean washcloth.  5.  Apply the CHG Soap to your body ONLY FROM THE NECK DOWN.   Do not use on face/ open                           Wound or open sores. Avoid contact with eyes, ears mouth and genitals (private parts).                       Wash face,  Genitals (private parts) with your normal soap.             6.  Wash thoroughly, paying special attention to the area where your surgery  will be performed.  7.  Thoroughly rinse your body with warm water from the neck down.  8.  DO NOT shower/wash with your normal soap after using and rinsing off  the CHG Soap.                9.  Pat yourself dry with a clean towel.            10.  Wear clean pajamas.            11.  Place clean sheets on your bed the night of your first shower and do not  sleep with pets. Day of Surgery : Do not apply any lotions/deodorants the morning of surgery.  Please wear clean clothes to the hospital/surgery center.  FAILURE TO FOLLOW THESE INSTRUCTIONS MAY RESULT IN THE CANCELLATION OF YOUR SURGERY PATIENT SIGNATURE_________________________________  NURSE SIGNATURE__________________________________  ________________________________________________________________________

## 2015-04-15 ENCOUNTER — Encounter (HOSPITAL_COMMUNITY): Payer: Self-pay

## 2015-04-15 ENCOUNTER — Encounter (HOSPITAL_COMMUNITY)
Admission: RE | Admit: 2015-04-15 | Discharge: 2015-04-15 | Disposition: A | Discharge status: Home / Self Care | Payer: Medicare HMO | Source: Ambulatory Visit | Attending: Urology | Admitting: Urology

## 2015-04-15 DIAGNOSIS — Z01812 Encounter for preprocedural laboratory examination: Secondary | ICD-10-CM | POA: Insufficient documentation

## 2015-04-15 DIAGNOSIS — N210 Calculus in bladder: Secondary | ICD-10-CM | POA: Diagnosis not present

## 2015-04-15 HISTORY — DX: Cerebral infarction, unspecified: I63.9

## 2015-04-15 HISTORY — DX: Anxiety disorder, unspecified: F41.9

## 2015-04-15 HISTORY — DX: Anesthesia of skin: R20.0

## 2015-04-15 HISTORY — DX: Unspecified osteoarthritis, unspecified site: M19.90

## 2015-04-15 HISTORY — DX: Chronic obstructive pulmonary disease, unspecified: J44.9

## 2015-04-15 LAB — BASIC METABOLIC PANEL
Anion gap: 10 (ref 5–15)
BUN: 17 mg/dL (ref 6–20)
CHLORIDE: 102 mmol/L (ref 101–111)
CO2: 26 mmol/L (ref 22–32)
CREATININE: 0.93 mg/dL (ref 0.61–1.24)
Calcium: 9.1 mg/dL (ref 8.9–10.3)
GFR calc Af Amer: >60 mL/min (ref >60)
GFR calc non Af Amer: >60 mL/min (ref >60)
Glucose, Bld: 107 mg/dL — ABNORMAL HIGH (ref 65–99)
Potassium: 4.4 mmol/L (ref 3.5–5.1)
SODIUM: 138 mmol/L (ref 135–145)

## 2015-04-15 LAB — CBC
HCT: 40 % (ref 39.0–52.0)
HEMOGLOBIN: 12.9 g/dL — AB (ref 13.0–17.0)
MCH: 30.1 pg (ref 26.0–34.0)
MCHC: 32.3 g/dL (ref 30.0–36.0)
MCV: 93.2 fL (ref 78.0–100.0)
Platelets: 274 10*3/uL (ref 150–400)
RBC: 4.29 MIL/uL (ref 4.22–5.81)
RDW: 14.2 % (ref 11.5–15.5)
WBC: 13.9 10*3/uL — ABNORMAL HIGH (ref 4.0–10.5)

## 2015-04-15 NOTE — Progress Notes (Signed)
CBC done 04/15/15 faxed via EPIC to Dr [name]

## 2015-04-15 NOTE — Progress Notes (Signed)
EKG-03/15/15-EPIC  CXR- 03/14/15-EPIC  03/29/15- CBC/DIFF/BMP- EPIC  03/29/15- LOV- PCP- EPIC

## 2015-04-15 NOTE — Progress Notes (Signed)
Patient scored a 5 on the STOP BANG Assessment Tool for Obstructive Sleep Apnea during a preop appointment.   This patient is considered a risk for Obstructive Sleep Apnea using this tool.

## 2015-04-16 ENCOUNTER — Telehealth: Payer: Self-pay | Admitting: *Deleted

## 2015-04-16 NOTE — Telephone Encounter (Signed)
Noted. He is medically stable for procedure

## 2015-04-16 NOTE — Telephone Encounter (Signed)
Carla with [name] called and left message and stated that patient is going to have a Cysto and Supra Pubic Cath Placement on the 20th and she wanted to let you know. Stated that patient's most recent EKG was done in the Hospital on 03/14/15. Anesthesia Dr. Dr. Suella Broad wants to make sure your aware of the surgery and it is ok with you. She stated that she will also be sending you a note in EPIC.

## 2015-04-16 NOTE — Progress Notes (Signed)
Spoke with Dr Suella Broad ( anesthesia) regarding EKG results from 03/14/2015 and also shown to him EKG done 04/21/2014. Also aware troponins in hospital were negative.   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  Dr Suella Broad ( anesthesia) wants PCP aware of upcoming surgery on 04/22/2015 of cystoscopy and suprapubic catheter placement and to make sure that Dr [name] is okay with patient having upcoming surgery.  Called office of Dr [name] at [facility] and left message regarding this on triage message line.

## 2015-04-17 DIAGNOSIS — N319 Neuromuscular dysfunction of bladder, unspecified: Secondary | ICD-10-CM | POA: Diagnosis not present

## 2015-04-17 DIAGNOSIS — N390 Urinary tract infection, site not specified: Secondary | ICD-10-CM | POA: Diagnosis not present

## 2015-04-17 DIAGNOSIS — J449 Chronic obstructive pulmonary disease, unspecified: Secondary | ICD-10-CM | POA: Diagnosis not present

## 2015-04-17 DIAGNOSIS — I10 Essential (primary) hypertension: Secondary | ICD-10-CM | POA: Diagnosis not present

## 2015-04-17 DIAGNOSIS — G629 Polyneuropathy, unspecified: Secondary | ICD-10-CM | POA: Diagnosis not present

## 2015-04-17 DIAGNOSIS — B964 Proteus (mirabilis) (morganii) as the cause of diseases classified elsewhere: Secondary | ICD-10-CM | POA: Diagnosis not present

## 2015-04-17 DIAGNOSIS — N400 Enlarged prostate without lower urinary tract symptoms: Secondary | ICD-10-CM | POA: Diagnosis not present

## 2015-04-17 DIAGNOSIS — L03115 Cellulitis of right lower limb: Secondary | ICD-10-CM | POA: Diagnosis not present

## 2015-04-17 DIAGNOSIS — R2681 Unsteadiness on feet: Secondary | ICD-10-CM | POA: Diagnosis not present

## 2015-04-17 DIAGNOSIS — R296 Repeated falls: Secondary | ICD-10-CM | POA: Diagnosis not present

## 2015-04-17 NOTE — Telephone Encounter (Signed)
Tried Insurance risk surveyor with [name] and NA and No Voicemail.

## 2015-04-17 NOTE — Telephone Encounter (Signed)
Carla notified and has seen note in [name]

## 2015-04-19 NOTE — Progress Notes (Signed)
Called and spoke with daughter , [name], and made her aware of time change .  Surgery time now 1100am-1230pm and patient to arrive at 0900am.  Daughter voiced understanding.

## 2015-04-22 ENCOUNTER — Ambulatory Visit (HOSPITAL_COMMUNITY): Payer: Medicare HMO | Admitting: Certified Registered Nurse Anesthetist

## 2015-04-22 ENCOUNTER — Encounter (HOSPITAL_COMMUNITY): Payer: Self-pay | Admitting: *Deleted

## 2015-04-22 ENCOUNTER — Encounter (HOSPITAL_COMMUNITY)
Admission: RE | Disposition: A | Discharge status: Home / Self Care | Payer: Self-pay | Source: Ambulatory Visit | Attending: Urology

## 2015-04-22 ENCOUNTER — Ambulatory Visit (HOSPITAL_COMMUNITY)
Admission: RE | Admit: 2015-04-22 | Discharge: 2015-04-22 | Disposition: A | Discharge status: Home / Self Care | Payer: Medicare HMO | Source: Ambulatory Visit | Attending: Urology | Admitting: Urology

## 2015-04-22 DIAGNOSIS — H9190 Unspecified hearing loss, unspecified ear: Secondary | ICD-10-CM | POA: Insufficient documentation

## 2015-04-22 DIAGNOSIS — Y831 Surgical operation with implant of artificial internal device as the cause of abnormal reaction of the patient, or of later complication, without mention of misadventure at the time of the procedure: Secondary | ICD-10-CM | POA: Diagnosis not present

## 2015-04-22 DIAGNOSIS — Z87891 Personal history of nicotine dependence: Secondary | ICD-10-CM | POA: Insufficient documentation

## 2015-04-22 DIAGNOSIS — Z79899 Other long term (current) drug therapy: Secondary | ICD-10-CM | POA: Diagnosis not present

## 2015-04-22 DIAGNOSIS — F419 Anxiety disorder, unspecified: Secondary | ICD-10-CM | POA: Diagnosis not present

## 2015-04-22 DIAGNOSIS — N3020 Other chronic cystitis without hematuria: Secondary | ICD-10-CM | POA: Insufficient documentation

## 2015-04-22 DIAGNOSIS — N3289 Other specified disorders of bladder: Secondary | ICD-10-CM | POA: Insufficient documentation

## 2015-04-22 DIAGNOSIS — Z8673 Personal history of transient ischemic attack (TIA), and cerebral infarction without residual deficits: Secondary | ICD-10-CM | POA: Diagnosis not present

## 2015-04-22 DIAGNOSIS — N400 Enlarged prostate without lower urinary tract symptoms: Secondary | ICD-10-CM | POA: Diagnosis not present

## 2015-04-22 DIAGNOSIS — T8389XA Other specified complication of genitourinary prosthetic devices, implants and grafts, initial encounter: Secondary | ICD-10-CM | POA: Insufficient documentation

## 2015-04-22 DIAGNOSIS — K219 Gastro-esophageal reflux disease without esophagitis: Secondary | ICD-10-CM | POA: Diagnosis not present

## 2015-04-22 DIAGNOSIS — H5442 Blindness, left eye, normal vision right eye: Secondary | ICD-10-CM | POA: Insufficient documentation

## 2015-04-22 DIAGNOSIS — E7800 Pure hypercholesterolemia, unspecified: Secondary | ICD-10-CM | POA: Insufficient documentation

## 2015-04-22 DIAGNOSIS — N210 Calculus in bladder: Secondary | ICD-10-CM | POA: Diagnosis not present

## 2015-04-22 DIAGNOSIS — B964 Proteus (mirabilis) (morganii) as the cause of diseases classified elsewhere: Secondary | ICD-10-CM | POA: Diagnosis not present

## 2015-04-22 DIAGNOSIS — Z8547 Personal history of malignant neoplasm of testis: Secondary | ICD-10-CM | POA: Insufficient documentation

## 2015-04-22 DIAGNOSIS — Z9049 Acquired absence of other specified parts of digestive tract: Secondary | ICD-10-CM | POA: Insufficient documentation

## 2015-04-22 DIAGNOSIS — Z87442 Personal history of urinary calculi: Secondary | ICD-10-CM | POA: Insufficient documentation

## 2015-04-22 DIAGNOSIS — Z435 Encounter for attention to cystostomy: Secondary | ICD-10-CM | POA: Diagnosis not present

## 2015-04-22 DIAGNOSIS — N390 Urinary tract infection, site not specified: Secondary | ICD-10-CM

## 2015-04-22 DIAGNOSIS — I10 Essential (primary) hypertension: Secondary | ICD-10-CM | POA: Diagnosis not present

## 2015-04-22 DIAGNOSIS — N4889 Other specified disorders of penis: Secondary | ICD-10-CM | POA: Insufficient documentation

## 2015-04-22 DIAGNOSIS — Z7982 Long term (current) use of aspirin: Secondary | ICD-10-CM | POA: Insufficient documentation

## 2015-04-22 DIAGNOSIS — J45909 Unspecified asthma, uncomplicated: Secondary | ICD-10-CM | POA: Insufficient documentation

## 2015-04-22 DIAGNOSIS — J449 Chronic obstructive pulmonary disease, unspecified: Secondary | ICD-10-CM | POA: Diagnosis not present

## 2015-04-22 HISTORY — PX: CYSTOSCOPY WITH LITHOLAPAXY: SHX1425

## 2015-04-22 HISTORY — PX: INSERTION OF SUPRAPUBIC CATHETER: SHX5870

## 2015-04-22 SURGERY — CYSTOSCOPY, WITH BLADDER CALCULUS LITHOLAPAXY
Anesthesia: General | Site: Bladder

## 2015-04-22 MED ORDER — SODIUM CHLORIDE 0.9 % IR SOLN
Status: DC | PRN
  Administered 2015-04-22 (×2): 3000 mL via INTRAVESICAL

## 2015-04-22 MED ORDER — PROPOFOL 10 MG/ML IV BOLUS
INTRAVENOUS | Status: AC
  Filled 2015-04-22: qty 20

## 2015-04-22 MED ORDER — PHENYLEPHRINE 40 MCG/ML (10ML) SYRINGE FOR IV PUSH (FOR BLOOD PRESSURE SUPPORT)
PREFILLED_SYRINGE | INTRAVENOUS | Status: AC
  Filled 2015-04-22: qty 10

## 2015-04-22 MED ORDER — FENTANYL CITRATE (PF) 100 MCG/2ML IJ SOLN
INTRAMUSCULAR | Status: AC
  Filled 2015-04-22: qty 2

## 2015-04-22 MED ORDER — EPHEDRINE SULFATE 50 MG/ML IJ SOLN
INTRAMUSCULAR | Status: AC
  Filled 2015-04-22: qty 1

## 2015-04-22 MED ORDER — PROPOFOL 10 MG/ML IV BOLUS
INTRAVENOUS | Status: DC | PRN
Start: 2015-04-22 — End: 2015-04-22
  Administered 2015-04-22: 40 mg via INTRAVENOUS
  Administered 2015-04-22: 140 mg via INTRAVENOUS

## 2015-04-22 MED ORDER — TRAMADOL-ACETAMINOPHEN 37.5-325 MG PO TABS: 1.0000 | ORAL_TABLET | Freq: Four times a day (QID) | ORAL | Status: DC | PRN

## 2015-04-22 MED ORDER — ONDANSETRON HCL 4 MG/2ML IJ SOLN
INTRAMUSCULAR | Status: DC | PRN
  Administered 2015-04-22: 4 mg via INTRAVENOUS

## 2015-04-22 MED ORDER — ONDANSETRON HCL 4 MG/2ML IJ SOLN
INTRAMUSCULAR | Status: AC
Start: 2015-04-22 — End: 2015-04-22
  Filled 2015-04-22: qty 2

## 2015-04-22 MED ORDER — FENTANYL CITRATE (PF) 100 MCG/2ML IJ SOLN: 25.0000 ug | INTRAMUSCULAR | Status: DC | PRN

## 2015-04-22 MED ORDER — LIDOCAINE HCL (CARDIAC) 20 MG/ML IV SOLN
INTRAVENOUS | Status: AC
  Filled 2015-04-22: qty 5

## 2015-04-22 MED ORDER — LIDOCAINE HCL (CARDIAC) 20 MG/ML IV SOLN
INTRAVENOUS | Status: DC | PRN
  Administered 2015-04-22: 100 mg via INTRAVENOUS

## 2015-04-22 MED ORDER — CEFAZOLIN SODIUM-DEXTROSE 2-3 GM-% IV SOLR
2.0000 g | INTRAVENOUS | Status: AC
  Administered 2015-04-22: 2 g via INTRAVENOUS

## 2015-04-22 MED ORDER — FENTANYL CITRATE (PF) 100 MCG/2ML IJ SOLN
INTRAMUSCULAR | Status: DC | PRN
Start: 2015-04-22 — End: 2015-04-22
  Administered 2015-04-22: 25 ug via INTRAVENOUS
  Administered 2015-04-22: 50 ug via INTRAVENOUS

## 2015-04-22 MED ORDER — LACTATED RINGERS IV SOLN
INTRAVENOUS | Status: DC | PRN
  Administered 2015-04-22: 11:00:00 via INTRAVENOUS

## 2015-04-22 MED ORDER — TRIMETHOPRIM 100 MG PO TABS: 100.0000 mg | ORAL_TABLET | Freq: Every day | ORAL | Status: DC

## 2015-04-22 MED ORDER — EPHEDRINE SULFATE 50 MG/ML IJ SOLN
INTRAMUSCULAR | Status: DC | PRN
  Administered 2015-04-22 (×2): 5 mg via INTRAVENOUS

## 2015-04-22 MED ORDER — ACETAMINOPHEN 500 MG PO TABS: 1000.0000 mg | ORAL_TABLET | Freq: Four times a day (QID) | ORAL | Status: DC | PRN

## 2015-04-22 MED ORDER — PHENYLEPHRINE HCL 10 MG/ML IJ SOLN
INTRAMUSCULAR | Status: DC | PRN
  Administered 2015-04-22: 40 ug via INTRAVENOUS

## 2015-04-22 MED ORDER — CEFAZOLIN SODIUM-DEXTROSE 2-3 GM-% IV SOLR
INTRAVENOUS | Status: AC
  Filled 2015-04-22: qty 50

## 2015-04-22 MED ORDER — DEXAMETHASONE SODIUM PHOSPHATE 10 MG/ML IJ SOLN
INTRAMUSCULAR | Status: AC
  Filled 2015-04-22: qty 1

## 2015-04-22 MED ORDER — STERILE WATER FOR IRRIGATION IR SOLN
Status: DC | PRN
  Administered 2015-04-22: 500 mL

## 2015-04-22 MED ORDER — SODIUM CHLORIDE 0.9 % IJ SOLN
INTRAMUSCULAR | Status: AC
  Filled 2015-04-22: qty 10

## 2015-04-22 SURGICAL SUPPLY — 30 items
BAG URINE DRAINAGE (UROLOGICAL SUPPLIES) ×3 IMPLANT
BAG URINE LEG 500ML (DRAIN) ×3 IMPLANT
BAG URO CATCHER STRL LF (MISCELLANEOUS) ×3 IMPLANT
BLADE SURG 15 STRL LF DISP TIS (BLADE) ×2 IMPLANT
BLADE SURG 15 STRL SS (BLADE) ×1
CATH SILASTIC FOLEY 18FRX5CC (CATHETERS) ×3 IMPLANT
CLOTH BEACON ORANGE TIMEOUT ST (SAFETY) ×3 IMPLANT
COUNTER NEEDLE 20 DBL MAG RED (NEEDLE) ×3 IMPLANT
ELECT PENCIL ROCKER SW 15FT (MISCELLANEOUS) IMPLANT
ELECT REM PT RETURN 9FT ADLT (ELECTROSURGICAL) ×3
ELECTRODE REM PT RTRN 9FT ADLT (ELECTROSURGICAL) ×2 IMPLANT
GAUZE SPONGE 4X4 16PLY XRAY LF (GAUZE/BANDAGES/DRESSINGS) ×3 IMPLANT
GLOVE BIO SURGEON STRL SZ7.5 (GLOVE) ×3 IMPLANT
GLOVE BIOGEL M STRL SZ7.5 (GLOVE) ×3 IMPLANT
GOWN STRL REUS W/TWL LRG LVL3 (GOWN DISPOSABLE) ×3 IMPLANT
GOWN STRL REUS W/TWL XL LVL3 (GOWN DISPOSABLE) ×6 IMPLANT
KIT SUPRAPUBIC CATH (MISCELLANEOUS) ×3 IMPLANT
MANIFOLD NEPTUNE II (INSTRUMENTS) ×3 IMPLANT
NEEDLE HYPO 22GX1.5 SAFETY (NEEDLE) IMPLANT
NS IRRIG 1000ML POUR BTL (IV SOLUTION) ×3 IMPLANT
PACK CYSTO (CUSTOM PROCEDURE TRAY) ×3 IMPLANT
PLUG CATH AND CAP STER (CATHETERS) IMPLANT
SCRUB PCMX 4 OZ (MISCELLANEOUS) ×3 IMPLANT
SUT ETHILON 3 0 PS 1 (SUTURE) ×3 IMPLANT
SYR 20CC LL (SYRINGE) ×3 IMPLANT
SYRINGE 10CC LL (SYRINGE) ×3 IMPLANT
SYRINGE IRR TOOMEY STRL 70CC (SYRINGE) ×3 IMPLANT
TOWEL OR 17X26 10 PK STRL BLUE (TOWEL DISPOSABLE) ×3 IMPLANT
TUBING CONNECTING 10 (TUBING) ×3 IMPLANT
WATER STERILE IRR 3000ML UROMA (IV SOLUTION) ×3 IMPLANT

## 2015-04-22 NOTE — Discharge Instructions (Signed)
Cystoscopy       Cystoscopy is a procedure that is used to help your caregiver diagnose and sometimes treat conditions that affect your lower urinary tract. Your lower urinary tract includes your bladder and the tube through which urine passes from your bladder out of your body (urethra). Cystoscopy is performed with a thin, tube-shaped instrument (cystoscope). The cystoscope has lenses and a light at the end so that your caregiver can see inside your bladder. The cystoscope is inserted at the entrance of your urethra. Your caregiver guides it through your urethra and into your bladder. There are two main types of cystoscopy:   Flexible cystoscopy (with a flexible cystoscope).   Rigid cystoscopy (with a rigid cystoscope).  Cystoscopy may be recommended for many conditions, including:   Urinary tract infections.   Blood in your urine (hematuria).   Loss of bladder control (urinary incontinence) or overactive bladder.   Unusual cells found in a urine sample.   Urinary blockage.   Painful urination.  Cystoscopy may also be done to remove a sample of your tissue to be checked under a microscope (biopsy). It may also be done to remove or destroy bladder stones.   LET YOUR CAREGIVER KNOW ABOUT:   Allergies to food or medicine.   Medicines taken, including vitamins, herbs, eyedrops, over-the-counter medicines, and creams.   Use of steroids (by mouth or creams).   Previous problems with anesthetics or numbing medicines.   History of bleeding problems or blood clots.   Previous surgery.   Other health problems, including diabetes and kidney problems.   Possibility of pregnancy, if this applies.  PROCEDURE   The area around the opening to your urethra will be cleaned. A medicine to numb your urethra (local anesthetic) is used. If a tissue sample or stone is removed during the procedure, you may be given a medicine to make you sleep (general anesthetic).   Your caregiver will gently insert the tip of the cystoscope into your  urethra. The cystoscope will be slowly glided through your urethra and into your bladder. Sterile fluid will flow through the cystoscope and into your bladder. The fluid will expand and stretch your bladder. This gives your caregiver a better view of your bladder walls. The procedure lasts about 15 20 minutes.   AFTER THE PROCEDURE   If a local anesthetic is used, you will be allowed to go home as soon as you are ready. If a general anesthetic is used, you will be taken to a recovery area until you are stable. You may have temporary bleeding and burning on urination.   Document Released: 01/17/2000 Document Revised: 10/14/2011 Document Reviewed: 07/13/2011   ExitCare® Patient Information ©2014 [employer].

## 2015-04-22 NOTE — Progress Notes (Signed)
Prescription for patient left on printer in PACU. Dr [name] did not sign it. Sharyn Lull in PACU paged him and he told her he would Escript them to [facility] in [facility]

## 2015-04-22 NOTE — Transfer of Care (Signed)
Immediate Anesthesia Transfer of Care Note  Patient: Ricardo [name]  Procedure(s) Performed: Procedure(s): CYSTOSCOPY WITH LITHOLAPAXY (N/A) INSERTION OF SUPRAPUBIC CATHETER (N/A)  Patient Location: PACU  Anesthesia Type:General  Level of Consciousness:  sedated, patient cooperative and responds to stimulation  Airway & Oxygen Therapy:Patient Spontanous Breathing and Patient connected to face mask oxgen  Post-op Assessment:  Report given to PACU RN and Post -op Vital signs reviewed and stable  Post vital signs:  Reviewed and stable  Last Vitals:  Filed Vitals:   04/22/15 0900  BP: 137/62  Pulse: 73  Temp: 36.4 C  Resp: 18    Complications: No apparent anesthesia complications

## 2015-04-22 NOTE — Anesthesia Postprocedure Evaluation (Signed)
Anesthesia Post Note  Patient: Ricardo [name]  Procedure(s) Performed: Procedure(s) (LRB): CYSTOSCOPY WITH LITHOLAPAXY (N/A) INSERTION OF SUPRAPUBIC CATHETER (N/A)  Patient location during evaluation: PACU Anesthesia Type: General Level of consciousness: awake and alert Pain management: pain level controlled Vital Signs Assessment: post-procedure vital signs reviewed and stable Respiratory status: spontaneous breathing, nonlabored ventilation, respiratory function stable and patient connected to nasal cannula oxygen Cardiovascular status: blood pressure returned to baseline and stable Postop Assessment: no signs of nausea or vomiting Anesthetic complications: no    Last Vitals:  Filed Vitals:   04/22/15 1230 04/22/15 1247  BP: 129/67 127/56  Pulse: 67 74  Temp: 36.3 C 36.3 C  Resp: 14 18    Last Pain: There were no vitals filed for this visit.               Brie Eppard L

## 2015-04-22 NOTE — Progress Notes (Signed)
SS notified pt ready to return, SS asked PACU to wait a few minutes.

## 2015-04-22 NOTE — Interval H&P Note (Signed)
History and Physical Interval Note:  04/22/2015 10:26 AM  Ricardo [name]  has presented today for surgery, with the diagnosis of BLADDER STONE  The various methods of treatment have been discussed with the patient and family. After consideration of risks, benefits and other options for treatment, the patient has consented to  Procedure(s): CYSTOSCOPY WITH LITHOLAPAXY (N/A) CYSTOSCOPY WITH HOLMIUM LASER LITHOTRIPSY (N/A) INSERTION OF SUPRAPUBIC CATHETER (N/A) as a surgical intervention .  The patient's history has been reviewed, patient examined, no change in status, stable for surgery.  I have reviewed the patient's chart and labs.  Questions were answered to the patient's satisfaction.     [name] I [name]

## 2015-04-22 NOTE — Anesthesia Procedure Notes (Signed)
Procedure Name: LMA Insertion Date/Time: 04/22/2015 11:08 AM Performed by: Christell Faith L Pre-anesthesia Checklist: Patient identified, Emergency Drugs available, Suction available, Patient being monitored and Timeout performed Patient Re-evaluated:Patient Re-evaluated prior to inductionOxygen Delivery Method: Circle system utilized Preoxygenation: Pre-oxygenation with 100% oxygen Intubation Type: IV induction Ventilation: Mask ventilation without difficulty LMA: LMA inserted LMA Size: 5.0 Tube secured with: Tape Dental Injury: Teeth and Oropharynx as per pre-operative assessment

## 2015-04-22 NOTE — Op Note (Signed)
Pre-operative diagnosis : stone obstruction of s-p tube  Postoperative diagnosis:  Same, plus BPH with large median lobe prostate  Operation:  Cystoscopy , cystolitholopaxy with irrigation of stone fragment and replacement of suprapubic tube with 63F silastic Foley cath, with 5cc balloon.   Surgeon:  Chauncey Cruel. [name], MD  First assistant:  none  Anesthesia: General LMA  Preparation:  After appropriate preanesthesia, the patient was brought to the operative room, placed on the operating table in the dorsal supine position where general LMA anesthesia was introduced. He was then replaced in the dorsal lithotomy position with pubis was prepped with Betadine solution and draped in usual fashion. The suprapubic tube was noted to drain, although very slowly.  The history was reviewed. It is noted that he has a history of a "clogged" SP tube, and x-rays revealed a stone at the end of the SP tube. The armband as double checked. The history was double checked.   Review history:  Discuss options for repeated clogged s/p tube   Active Problems Problems  1. Chronic cystitis (N30.20) 2. 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 History of bladder stone (Z87.448) 4. No post-op complications (99991111) 5. 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 Urinary tract infection due to Proteus (N39.0,B96.4)  Assessed By: [name] (Urology); Last Assessed: 01 Apr 2015  History of Present Illness    80 YO widowed male returns today with his daughter, [name], to discuss options for repeated clogged s/p tube that requires frequent changes or ER visits. He is deaf and blind. His hearing aids are broken today, and I am only able to communicate with his daughter.    Last s/p tube change was on 03/08/15 due to sediment clogged catheter. Currently taking Ellura. He has just  been released from [facility] 5 days ago for Proteus UTI, and water intoxication ( Na 118).    He is s/p cystoscopy, cystolitholapaxy, and unsuccessful irrigation of bladder with vinegar and saline, after initial placement of #18 F SP tube 05/14/14.    His daughter, [name], his primary caregiver and primary historian.      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 He was visiting his son in [place], [place], and contracted bacterial meningitis, May, 2015. He was seen at the [facility], and had Foley catheter inserted. He was in a coma for 3 1/2 weeks, complicated by "mini-strokes". Urodynamics accomplished, but results unknown. He has been on trimethoprim suppression therapy. He has had chronic foley catheterization since his meningitis, because of repeated failed voiding trials, despite tamsulosin and finasteride. He has had several episodes of traumatic foley catheter removals, but is currently doing well with size 18 F Silicone catheter, with 10cc sterile water in the balloon. .    Statement of  Likelihood of Success: Excellent. TIME-OUT observed.:  Procedure:  Cystourethroscopy was accomplished, and showed normal urethra. The prostate was noted to have trilobar BPH, with a large intravesical median lobe. Within the bladder, trabeculation and cellules were noted. A suprapubic catheter was noted to be in place. Stone was noted to be within the catheter, and this was manipulated free from the catheter, crushed and irrigated free from the bladder. There was no stone for pathology evaluation.  The suprapubic catheter was then removed, and replaced with an 18 [place] Silastic catheter. No bleeding was noted. 10 mL of water placed in the balloon.  The  patient was then awakened and taken to recovery room in good condition. The catheter was laced to straight drainage.

## 2015-04-22 NOTE — Anesthesia Preprocedure Evaluation (Signed)
Anesthesia Evaluation  Patient identified by MRN, date of birth, ID band Patient awake    Reviewed: Allergy & Precautions, NPO status , Patient's Chart, lab work & pertinent test results  Airway Mallampati: II  TM Distance: >3 FB Neck ROM: Full    Dental no notable dental hx. (+) Dental Advisory Given, Teeth Intact   Pulmonary COPD, former smoker,    Pulmonary exam normal breath sounds clear to auscultation       Cardiovascular hypertension, Pt. on medications Normal cardiovascular exam Rhythm:Regular Rate:Normal     Neuro/Psych Anxiety H/o meningitis. Blind. Unable to walk TIA Neuromuscular disease CVA, Residual Symptoms negative psych ROS   GI/Hepatic negative GI ROS, Neg liver ROS, GERD  Medicated and Controlled,  Endo/Other  negative endocrine ROS  Renal/GU negative Renal ROS  negative genitourinary   Musculoskeletal negative musculoskeletal ROS (+)   Abdominal   Peds negative pediatric ROS (+)  Hematology negative hematology ROS (+)   Anesthesia Other Findings   Reproductive/Obstetrics negative OB ROS                             Anesthesia Physical Anesthesia Plan  ASA: III  Anesthesia Plan: General   Post-op Pain Management:    Induction: Intravenous  Airway Management Planned: LMA  Additional Equipment:   Intra-op Plan:   Post-operative Plan:   Informed Consent:   Plan Discussed with: Surgeon  Anesthesia Plan Comments:         Anesthesia Quick Evaluation

## 2015-04-22 NOTE — H&P (Signed)
Reason For Visit Discuss options for repeated clogged s/p tube   Active Problems Problems  1. Chronic cystitis (N30.20) 2. 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 History of bladder stone (Z87.448) 4. No post-op complications (99991111) 5. Penile erosion (N48.89)   Assessed By: [name] (Urology); Last Assessed: 01 Apr 2015 6. Urinary tract infection due to Proteus (N39.0,B96.4)   Assessed By: [name] (Urology); Last Assessed: 01 Apr 2015  History of Present Illness     80 YO widowed male returns today with his daughter, [name], to discuss options for repeated clogged s/p tube that requires frequent changes or ER visits. He is deaf and blind. His hearing aids are broken today, and I am only able to communicate with his daughter.     Last s/p tube change was on 03/08/15 due to sediment clogged catheter. Currently taking Ellura. He has just been released from [facility] 5 days ago for Proteus UTI, and water intoxication ( Na 118).     He is s/p cystoscopy, cystolitholapaxy, and unsuccessful irrigation of bladder with vinegar and saline, after initial placement of #18 F SP tube 05/14/14.    His daughter, [name], his primary caregiver and primary historian.        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 He was visiting his son in [place], [place], and contracted bacterial meningitis, May, 2015. He was seen at the [facility], and had Foley catheter inserted. He was in a coma for 3 1/2 weeks, complicated by "mini-strokes". Urodynamics accomplished, but results unknown. He has been on trimethoprim suppression therapy. He has had chronic foley catheterization since his meningitis, because of repeated failed voiding trials, despite tamsulosin and finasteride. He has had several episodes of traumatic foley catheter removals, but is currently doing well with size 18 F Silicone  catheter, with 10cc sterile water in the balloon. .    Note additional history:   1. HBP  2. 8+ UTI this year  3. elevated cholesterol  4. GERD  5. insomnia  6. Deaf in R ear; cochlear implant in L ear  7. 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 Blind L eye ( meningitis).   Past Medical History Problems  1. History of Anxiety (F41.9) 2. History of Bladder calculus (N21.0) 3. History of asthma (Z87.09) 4. History of blindness (Z86.69) 5. History of hypercholesterolemia (Z86.39) 6. History of hypertension (Z86.79) 7. History of meningitis (Z86.61) 8. History of rotator cuff tear (Z87.39) 9. History of stroke (Z86.73) 10. History of Testicle cancer, left (C62.92)  Surgical History Problems  1. History of Appendectomy 2. History of Cystoscopy With Fragmentation Of Bladder Calculus 3. History of Ear Surgery 4. History of Prostate Surgery 5. History of Simple Replacement Of Cystostomy Tube 6. History of Surgery Testis Orchiectomy 7. History of Tonsillectomy  Current Meds 1. Advair Diskus 100-50 MCG/DOSE MISC;  Therapy: (Recorded:16Feb2016) to Recorded 2. Aspirin 81 MG TABS;  Therapy: (Recorded:16Feb2016) to Recorded 3. Atorvastatin Calcium TABS;  Therapy: (Recorded:16Feb2016) to Recorded 4. Calcium CAPS;  Therapy: (Recorded:16Feb2016) to Recorded 5. Citalopram Hydrobromide 20 MG Oral Tablet;  Therapy: (Recorded:15Nov2016) to Recorded 6. Co Q 10 CAPS;  Therapy: (Recorded:03May2016) to Recorded 7. Ellura 200 MG Oral Capsule; TAKE 1 CAPSULE Daily;  Therapy: [id number] to (Evaluate:11Mar2017)  Requested for: [id number]; Last  Rx:16Mar2016 Ordered 8. Finasteride TABS;  Therapy: (Recorded:16Feb2016) to Recorded 9. Losartan Potassium 25 MG Oral Tablet;  Therapy: (Recorded:22Dec2016) to Recorded 10. Multi Vitamin/Minerals  TABS;   Therapy: (Recorded:16Feb2016) to Recorded 11. Pantoprazole Sodium TBEC;   Therapy: (Recorded:16Feb2016) to Recorded 12. Probiotic Oral Capsule;    Therapy: (Recorded:16Mar2016) to Recorded 13. TraZODone HCl - 50 MG Oral Tablet; 0.5 tablet daily;   Therapy: (Recorded:15Nov2016) to Recorded 14. Trimethoprim-Sulfamethoxazole SUSP;   Therapy: (Recorded:16Feb2016) to Recorded 15. Tylenol TABS;   Therapy: (Recorded:16Feb2016) to Recorded 16. Vitamin D3 TABS;   Therapy: (Recorded:16Feb2016) to Recorded  Allergies Medication  1. No Known Drug Allergies  Family History Problems  1. Family history of hypertension (Z82.49) : Mother 2. Family history of Heart trouble : Father  Social History Problems  1. Denied: History of Alcohol use 2. Caffeine use (F15.90)   2-3 3. Former smoker [phone])   smoked 10 years quit 1966 4. Retired 63. Two children   both adopted 6. Widowed  Review of Systems Constitutional, skin, eye, otolaryngeal, hematologic/lymphatic, cardiovascular, pulmonary, endocrine, musculoskeletal, gastrointestinal and neurological system(s) were reviewed and pertinent findings if present are noted and are otherwise negative.  Gastrointestinal: constipation, but no nausea, no vomiting and no diarrhea.  Neurological: limb weakness and difficulty walking.  Psychiatric: sleep disturbances.    Vitals Vital Signs [Data Includes: Last 1 Day]  Recorded: 27Feb2017 08:16AM  Blood Pressure: 142 / 80 Temperature: 97.7 F Heart Rate: 80  Physical Exam Constitutional: Well nourished and well developed . No acute distress. The patient appears well hydrated. Wheelchair-bound.  ENT:. The oropharynx is normal. Hearing loss is noted. Left cochlear implant. No hearing in Right ear. some hearing in L ear.  Neck: no neck mass is present.  Pulmonary:. Breathing requires use of accessory muscles.  Cardiovascular:. No peripheral edema.  Abdomen: The abdomen is mildly obese. The abdomen is normal to percussion.  Rectal: Rectal exam demonstrates absent sphincter tone and the anus is normal on inspection. Estimated prostate size is 3+.  The prostate has no nodularity, is not indurated, is not tender and is fluctuant. The left seminal vesicle is nonpalpable. The right seminal vesicle is nonpalpable. The perineum is normal on inspection.  Genitourinary: Mid-shaft hypospadius 2ndary necrosis.  Lymphatics: The femoral and inguinal nodes are not enlarged or tender.  Skin: Normal skin turgor, no visible rash and no visible skin lesions.  The patient is hemipalegic.    Assessment Assessed  1. 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 Urinary tract infection due to Proteus (N39.0,B96.4) 3. 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 Chronic cystitis (N30.20) 5. History of bladder stone (Z87.448)  Chronic Proteus UTI. Probable renal or bladder stone.Recent 13 day hospitalization for sepsis, but no CT scan accomplished. Full discussion with daughter ( POA, HCPOA). He needs CT stone protocol, and we will download hospital labs and d/c summary; and she will RTC wit her father for f/u. I am concerned that we will find more stone. In addition,, we have discussed possible alternatives to his s-p tube, which would mean cystectomy for chronic infection and stone, and also ileal loop. ( 45 minute face to face evaluation).   Plan Chronic cystitis, Chronic suprapubic catheter, History of bladder stone, Penile erosion, Urinary tract infection due to Proteus  1. Follow-up After Test Office  Follow-up: daughter to accompany father.  Status: Hold For -  Appointment,Date of Service  Requested for: 27Feb2017 Chronic cystitis, Chronic suprapubic catheter, History of bladder stone, Urinary tract infection due to Proteus  2. AU CT-STONE PROTOCOL; Status:Hold For - Appointment,PreCert,Date of Service,Print;  Requested for:27Feb2017;   1. CT stone protocol  2. Download recent hospital C/c summary and labs  3. Pt  and daughter to RTC to discuss.   Signatures Electronically signed by : [name], M.D.; Apr 01 2015  8:53AM EST

## 2015-04-24 ENCOUNTER — Other Ambulatory Visit: Payer: Self-pay | Admitting: Internal Medicine

## 2015-04-24 DIAGNOSIS — I10 Essential (primary) hypertension: Secondary | ICD-10-CM | POA: Diagnosis not present

## 2015-04-24 DIAGNOSIS — B964 Proteus (mirabilis) (morganii) as the cause of diseases classified elsewhere: Secondary | ICD-10-CM | POA: Diagnosis not present

## 2015-04-24 DIAGNOSIS — L03115 Cellulitis of right lower limb: Secondary | ICD-10-CM | POA: Diagnosis not present

## 2015-04-24 DIAGNOSIS — N319 Neuromuscular dysfunction of bladder, unspecified: Secondary | ICD-10-CM | POA: Diagnosis not present

## 2015-04-24 DIAGNOSIS — N390 Urinary tract infection, site not specified: Secondary | ICD-10-CM | POA: Diagnosis not present

## 2015-04-24 DIAGNOSIS — G629 Polyneuropathy, unspecified: Secondary | ICD-10-CM | POA: Diagnosis not present

## 2015-04-24 DIAGNOSIS — R296 Repeated falls: Secondary | ICD-10-CM | POA: Diagnosis not present

## 2015-04-24 DIAGNOSIS — N400 Enlarged prostate without lower urinary tract symptoms: Secondary | ICD-10-CM | POA: Diagnosis not present

## 2015-04-24 DIAGNOSIS — R2681 Unsteadiness on feet: Secondary | ICD-10-CM | POA: Diagnosis not present

## 2015-04-24 DIAGNOSIS — J449 Chronic obstructive pulmonary disease, unspecified: Secondary | ICD-10-CM | POA: Diagnosis not present

## 2015-05-01 ENCOUNTER — Encounter: Payer: Self-pay | Admitting: Internal Medicine

## 2015-05-01 ENCOUNTER — Ambulatory Visit (INDEPENDENT_AMBULATORY_CARE_PROVIDER_SITE_OTHER): Payer: Medicare HMO | Admitting: Internal Medicine

## 2015-05-01 VITALS — BP 120/82 | HR 71 | Temp 97.8°F | Resp 20 | Ht 72.0 in | Wt 180.0 lb

## 2015-05-01 DIAGNOSIS — L03115 Cellulitis of right lower limb: Secondary | ICD-10-CM | POA: Diagnosis not present

## 2015-05-01 DIAGNOSIS — Z9289 Personal history of other medical treatment: Secondary | ICD-10-CM

## 2015-05-01 DIAGNOSIS — N390 Urinary tract infection, site not specified: Secondary | ICD-10-CM | POA: Diagnosis not present

## 2015-05-01 DIAGNOSIS — N319 Neuromuscular dysfunction of bladder, unspecified: Secondary | ICD-10-CM

## 2015-05-01 DIAGNOSIS — I10 Essential (primary) hypertension: Secondary | ICD-10-CM | POA: Diagnosis not present

## 2015-05-01 DIAGNOSIS — Z978 Presence of other specified devices: Secondary | ICD-10-CM

## 2015-05-01 DIAGNOSIS — N400 Enlarged prostate without lower urinary tract symptoms: Secondary | ICD-10-CM | POA: Diagnosis not present

## 2015-05-01 DIAGNOSIS — Z960 Presence of urogenital implants: Secondary | ICD-10-CM

## 2015-05-01 DIAGNOSIS — G629 Polyneuropathy, unspecified: Secondary | ICD-10-CM | POA: Diagnosis not present

## 2015-05-01 DIAGNOSIS — R2681 Unsteadiness on feet: Secondary | ICD-10-CM | POA: Diagnosis not present

## 2015-05-01 DIAGNOSIS — J449 Chronic obstructive pulmonary disease, unspecified: Secondary | ICD-10-CM | POA: Diagnosis not present

## 2015-05-01 DIAGNOSIS — B964 Proteus (mirabilis) (morganii) as the cause of diseases classified elsewhere: Secondary | ICD-10-CM | POA: Diagnosis not present

## 2015-05-01 DIAGNOSIS — R296 Repeated falls: Secondary | ICD-10-CM | POA: Diagnosis not present

## 2015-05-01 MED ORDER — CEFDINIR 300 MG PO CAPS: 300.0000 mg | ORAL_CAPSULE | Freq: Two times a day (BID) | ORAL | Status: DC

## 2015-05-01 NOTE — Patient Instructions (Addendum)
Continue current medications as ordered  Use silvadene cream as directed  Take 1 additional round of antibiotic with probiotic daily  Follow up with specialists as scheduled  Follow up in 2 mos for routine visit  Handicap placard application completed

## 2015-05-01 NOTE — Progress Notes (Signed)
Patient ID: Ricardo [name], male   DOB: [birth year], 80 y.o.   MRN: [id number]    Location:    [facility]   Place of Service:   OFFICE  Chief Complaint  Patient presents with  . Medical Management of Chronic Issues    1 month follow-up for low sodium, and toe, and swollen feet, hip pain both, Labs printed  . OTHER    Daughter in room with patient    HPI:  80 yo male seen today for f/u recurrent UTI. tx with omnicef at last OV. He was admitted into hospital for bladder stone and underwent cystourethroscopy and suprapubic cath re-insertion by Dr [name] 3/20th. He reports urinary urgency resolved.  he still has soreness in right 5th toe and swelling in feet in AM. He completed omnicef abx for RLE cellulitis.   Past Medical History  Diagnosis Date  . Rotator cuff tear     right  . Hypertension   . High cholesterol   . Meningitis   . Enlarged prostate   . 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 Deaf     due to meningitis  . Loss of balance     due to meningitis  . Unable to walk     due to meningitis  . Eczema   . Cancer [facility]) 1956    left testicle  . Skin cancer   . Neurogenic bladder     History of  . Stroke [facility])     mini strokes in hospital with meningitis  . Numbness     in feet due to meningitis   . COPD (chronic obstructive pulmonary disease) ([place])   . Anxiety   . Arthritis     neck     Past Surgical History  Procedure Laterality Date  . Appendectomy    . Prostate surgery      biopsy x 2  . Tonsillectomy      childhood  . Testicle removal Left     October 1956  . Cochlear implant    . Cystoscopy with litholapaxy N/A 05/14/2014    Procedure: CYSTOSCOPY WITH LITHOLAPAXY;  Surgeon: [name], MD;  Location: [facility];  Service: Urology;  Laterality: N/A;  . Insertion of suprapubic catheter N/A 05/14/2014    Procedure: INSERTION OF SUPRAPUBIC CATHETER;  Surgeon: [name], MD;  Location: [facility];  Service: Urology;  Laterality: N/A;  . Cystoscopy with  litholapaxy N/A 04/22/2015    Procedure: CYSTOSCOPY WITH LITHOLAPAXY;  Surgeon: [name], MD;  Location: [facility];  Service: Urology;  Laterality: N/A;  . Insertion of suprapubic catheter N/A 04/22/2015    Procedure: INSERTION OF SUPRAPUBIC CATHETER;  Surgeon: [name], MD;  [facility];  Service: Urology;  Laterality: N/A;    Patient Care Team: [name], DO as PCP - General (Internal Medicine) [name], MD as Consulting Physician (Urology)  Social History   Social History  . Marital Status: Widowed    Spouse Name: N/A  . Number of Children: N/A  . Years of Education: N/A   Occupational History  . Not on file.   Social History Main Topics  . Smoking status: Former Smoker -- 10 years    Types: Cigarettes    Start date: 02/10/1955    Quit date: 02/09/1965  . Smokeless tobacco: Never Used  . Alcohol Use: No  . Drug Use: No  . Sexual Activity: Not on file   Other Topics Concern  . Not on file  Social History Narrative   Diet- Fairly balanced   Caffeine- Yes, Coffee   Married- 1964, [place] living now with daughter, all on the first floor   Pets-4, dog and Radiation protection practitioner, English as a second language teacher for [employer], Chief Financial Officer, Freight forwarder   Exercise- Yes, Rehab 3 times a week   Living will- Yes   DNR- Yes   POA/HPOA- Yes           reports that he quit smoking about 50 years ago. His smoking use included Cigarettes. He started smoking about 60 years ago. He quit after 10 years of use. He has never used smokeless tobacco. He reports that he does not drink alcohol or use illicit drugs.  Allergies  Allergen Reactions  . Peanut Oil Shortness Of Breath and Swelling    Itching  . Peanut-Containing Drug Products Shortness Of Breath and Swelling    Medications: Patient's Medications  New Prescriptions   No medications on file  Previous Medications   AMLODIPINE (NORVASC) 5 MG TABLET    TAKE 1 TABLET (5 MG TOTAL) BY MOUTH  DAILY.   ASPIRIN 81 MG CHEWABLE TABLET    Take one tablet by mouth once daily   ATORVASTATIN (LIPITOR) 10 MG TABLET    TAKE 1 TABLET (10 MG TOTAL) BY MOUTH AT BEDTIME.   CITALOPRAM (CELEXA) 20 MG TABLET    Take one tablet by mouth once daily for anxiety   CRANBERRY (ELLURA PO)    Take 2 tablets by mouth at bedtime.   LACTOBACILLUS (ACIDOPHILUS) 100 MG CAPS    Take 100 mg by mouth daily.   MAGNESIUM HYDROXIDE (MILK OF MAGNESIA) 400 MG/5ML SUSPENSION    Take 15 mLs by mouth daily as needed for mild constipation.   MELATONIN 10 MG TABS    Take 10 mg by mouth at bedtime.    PANTOPRAZOLE (PROTONIX) 40 MG TABLET    Take one tablet by mouth once daily for stomach   SEREVENT DISKUS 50 MCG/DOSE DISKUS INHALER    TAKE 1 PUFF TWICE A DAY   SILVER SULFADIAZINE (SILVADENE) 1 % CREAM    Cleanse wound daily then apply cream to the wound and cover with gauze   SODIUM CHLORIDE 1 G TABLET    Take 1 tablet (1 g total) by mouth 3 (three) times daily with meals.   TRAZODONE (DESYREL) 50 MG TABLET    Take one tablet by mouth at bedtime as needed for rest   TRIMETHOPRIM (TRIMPEX) 100 MG TABLET    Take 1 tablet (100 mg total) by mouth daily.  Modified Medications   No medications on file  Discontinued Medications   CEFDINIR (OMNICEF) 300 MG CAPSULE    Take 1 capsule (300 mg total) by mouth 2 (two) times daily.   POLYETHYLENE GLYCOL (MIRALAX / GLYCOLAX) PACKET    Take 17 g by mouth daily.   TRAMADOL-ACETAMINOPHEN (ULTRACET) 37.5-325 MG TABLET    Take 1 tablet by mouth every 6 (six) hours as needed.   ZOLPIDEM (AMBIEN) 5 MG TABLET    Take 1 tablet (5 mg total) by mouth at bedtime as needed for sleep.    Review of Systems  All other systems reviewed and are negative.   Filed Vitals:   05/01/15 1139  BP: 120/82  Pulse: 71  Temp: 97.8 F (36.6 C)  TempSrc: Oral  Resp: 20  Height: 6' (1.829 m)  Weight: 180 lb (81.647 kg)  SpO2: 96%   Body mass index is 24.41 kg/(m^2).  Physical Exam  Constitutional: He  is oriented to person, place, and time. He appears well-developed and well-nourished. No distress.  sitting in w/c in nAD  Cardiovascular:  Trace LE edema b/l. No calf TTP  Abdominal: Soft. Bowel sounds are normal. He exhibits no distension and no mass. There is no tenderness. There is no rebound and no guarding.  Suprapubic cath intact with foley DTG  Musculoskeletal: He exhibits edema and tenderness.  Neurological: He is alert and oriented to person, place, and time.  Skin: Skin is warm and dry. No rash noted.  Right 5th toe at base, TTP and min red but no d/c  Psychiatric: He has a normal mood and affect. His behavior is normal. Thought content normal.     Labs reviewed: Hospital Outpatient Visit on 04/15/2015  Component Date Value Ref Range Status  . WBC 04/15/2015 13.9* 4.0 - 10.5 K/uL Final  . RBC 04/15/2015 4.29  4.22 - 5.81 MIL/uL Final  . Hemoglobin 04/15/2015 12.9* 13.0 - 17.0 g/dL Final  . HCT 04/15/2015 40.0  39.0 - 52.0 % Final  . MCV 04/15/2015 93.2  78.0 - 100.0 fL Final  . MCH 04/15/2015 30.1  26.0 - 34.0 pg Final  . MCHC 04/15/2015 32.3  30.0 - 36.0 g/dL Final  . RDW 04/15/2015 14.2  11.5 - 15.5 % Final  . Platelets 04/15/2015 274  150 - 400 K/uL Final  . Sodium 04/15/2015 138  135 - 145 mmol/L Final  . Potassium 04/15/2015 4.4  3.5 - 5.1 mmol/L Final  . Chloride 04/15/2015 102  101 - 111 mmol/L Final  . CO2 04/15/2015 26  22 - 32 mmol/L Final  . Glucose, Bld 04/15/2015 107* 65 - 99 mg/dL Final  . BUN 04/15/2015 17  6 - 20 mg/dL Final  . Creatinine, Ser 04/15/2015 0.93  0.61 - 1.24 mg/dL Final  . Calcium 04/15/2015 9.1  8.9 - 10.3 mg/dL Final  . GFR calc non Af Amer 04/15/2015 >60  >60 mL/min Final  . GFR calc Af Amer 04/15/2015 >60  >60 mL/min Final   Comment: (NOTE) The eGFR has been calculated using the CKD EPI equation. This calculation has not been validated in all clinical situations. eGFR's persistently <60 mL/min signify possible Chronic  Kidney Disease.   . Anion gap 04/15/2015 10  5 - 15 Final  Office Visit on 03/29/2015  Component Date Value Ref Range Status  . Glucose 03/29/2015 90  65 - 99 mg/dL Final  . BUN 03/29/2015 18  8 - 27 mg/dL Final  . Creatinine, Ser 03/29/2015 0.88  0.76 - 1.27 mg/dL Final  . GFR calc non Af Amer 03/29/2015 83  >59 mL/min/1.73 Final  . GFR calc Af Amer 03/29/2015 96  >59 mL/min/1.73 Final  . BUN/Creatinine Ratio 03/29/2015 20  10 - 22 Final  . Sodium 03/29/2015 134  134 - 144 mmol/L Final  . Potassium 03/29/2015 4.1  3.5 - 5.2 mmol/L Final  . Chloride 03/29/2015 94* 96 - 106 mmol/L Final  . CO2 03/29/2015 19  18 - 29 mmol/L Final  . Calcium 03/29/2015 8.8  8.6 - 10.2 mg/dL Final  . WBC 03/29/2015 9.9  3.4 - 10.8 x10E3/uL Final  . RBC 03/29/2015 4.31  4.14 - 5.80 x10E6/uL Final  . Hemoglobin 03/29/2015 12.7  12.6 - 17.7 g/dL Final  . Hematocrit 03/29/2015 37.1* 37.5 - 51.0 % Final  . MCV 03/29/2015 86  79 - 97 fL Final  . MCH 03/29/2015 29.5  26.6 - 33.0  pg Final  . MCHC 03/29/2015 34.2  31.5 - 35.7 g/dL Final  . RDW 03/29/2015 13.0  12.3 - 15.4 % Final  . Platelets 03/29/2015 396* 150 - 379 x10E3/uL Final  . Neutrophils 03/29/2015 63   Final  . Lymphs 03/29/2015 19   Final  . Monocytes 03/29/2015 14   Final  . Eos 03/29/2015 2   Final  . Basos 03/29/2015 1   Final  . Neutrophils Absolute 03/29/2015 6.3  1.4 - 7.0 x10E3/uL Final  . Lymphocytes Absolute 03/29/2015 1.9  0.7 - 3.1 x10E3/uL Final  . Monocytes Absolute 03/29/2015 1.4* 0.1 - 0.9 x10E3/uL Final  . EOS (ABSOLUTE) 03/29/2015 0.2  0.0 - 0.4 x10E3/uL Final  . Basophils Absolute 03/29/2015 0.1  0.0 - 0.2 x10E3/uL Final  . Immature Granulocytes 03/29/2015 1   Final  . Immature Grans (Abs) 03/29/2015 0.1  0.0 - 0.1 x10E3/uL Final  Admission on 03/14/2015, Discharged on 03/27/2015  No results displayed because visit has over 200 results.    Office Visit on 03/13/2015  Component Date Value Ref Range Status  . Aerobic  Bacterial Culture 03/13/2015 Final report*  Final  . Result 1 03/13/2015 Proteus mirabilis*  Final   Heavy growth  . ANTIMICROBIAL SUSCEPTIBILITY 03/13/2015 Comment   Final   Comment:       ** S = Susceptible; I = Intermediate; R = Resistant **                    P = Positive; N = Negative             MICS are expressed in micrograms per mL    Antibiotic                 RSLT#1    RSLT#2    RSLT#3    RSLT#4 Amoxicillin/Clavulanic Acid    S Ampicillin                     I Cefepime                       S Ceftriaxone                    S Cefuroxime                     S Ciprofloxacin                  R Ertapenem                      S Gentamicin                     S Levofloxacin                   R Piperacillin                   S Tetracycline                   R Tobramycin                     S Trimethoprim/Sulfa             R     No results found.   Assessment/Plan   ICD-9-CM ICD-10-CM   1. Cellulitis of right lower extremity 682.6 L03.115 cefdinir (OMNICEF) 300 MG capsule  2. Foley catheter in place V45.89 Z92.89   3. Neurogenic bladder 596.54 N31.9    Continue current medications as ordered  Use silvadene cream as directed  Take 1 additional round of antibiotic with probiotic daily  Follow up with specialists as scheduled  Follow up in 2 mos for routine visit  Handicap placard application completed  Chattie Greeson S. [name]  [facility] [address] [place], [place] [address] [phone] Cell (Monday-Friday 8 AM - 5 PM) [phone] After 5 PM and follow prompts

## 2015-05-02 ENCOUNTER — Emergency Department (HOSPITAL_COMMUNITY)
Admission: EM | Admit: 2015-05-02 | Discharge: 2015-05-02 | Disposition: A | Discharge status: Home / Self Care | Payer: Medicare HMO | Attending: Emergency Medicine | Admitting: Emergency Medicine

## 2015-05-02 ENCOUNTER — Encounter (HOSPITAL_COMMUNITY): Payer: Self-pay | Admitting: Emergency Medicine

## 2015-05-02 DIAGNOSIS — T83198A Other mechanical complication of other urinary devices and implants, initial encounter: Secondary | ICD-10-CM | POA: Diagnosis not present

## 2015-05-02 DIAGNOSIS — H9190 Unspecified hearing loss, unspecified ear: Secondary | ICD-10-CM | POA: Insufficient documentation

## 2015-05-02 DIAGNOSIS — R69 Illness, unspecified: Secondary | ICD-10-CM | POA: Diagnosis not present

## 2015-05-02 DIAGNOSIS — F419 Anxiety disorder, unspecified: Secondary | ICD-10-CM | POA: Diagnosis not present

## 2015-05-02 DIAGNOSIS — J449 Chronic obstructive pulmonary disease, unspecified: Secondary | ICD-10-CM | POA: Diagnosis not present

## 2015-05-02 DIAGNOSIS — Z9359 Other cystostomy status: Secondary | ICD-10-CM

## 2015-05-02 DIAGNOSIS — T83498A Other mechanical complication of other prosthetic devices, implants and grafts of genital tract, initial encounter: Secondary | ICD-10-CM | POA: Diagnosis not present

## 2015-05-02 DIAGNOSIS — Y828 Other medical devices associated with adverse incidents: Secondary | ICD-10-CM | POA: Insufficient documentation

## 2015-05-02 DIAGNOSIS — Z872 Personal history of diseases of the skin and subcutaneous tissue: Secondary | ICD-10-CM | POA: Insufficient documentation

## 2015-05-02 DIAGNOSIS — I10 Essential (primary) hypertension: Secondary | ICD-10-CM | POA: Diagnosis not present

## 2015-05-02 DIAGNOSIS — Z85828 Personal history of other malignant neoplasm of skin: Secondary | ICD-10-CM | POA: Insufficient documentation

## 2015-05-02 DIAGNOSIS — Z7982 Long term (current) use of aspirin: Secondary | ICD-10-CM | POA: Diagnosis not present

## 2015-05-02 DIAGNOSIS — Z8673 Personal history of transient ischemic attack (TIA), and cerebral infarction without residual deficits: Secondary | ICD-10-CM | POA: Insufficient documentation

## 2015-05-02 DIAGNOSIS — Z87891 Personal history of nicotine dependence: Secondary | ICD-10-CM | POA: Diagnosis not present

## 2015-05-02 DIAGNOSIS — N390 Urinary tract infection, site not specified: Secondary | ICD-10-CM | POA: Diagnosis not present

## 2015-05-02 DIAGNOSIS — Z79899 Other long term (current) drug therapy: Secondary | ICD-10-CM | POA: Diagnosis not present

## 2015-05-02 DIAGNOSIS — M1990 Unspecified osteoarthritis, unspecified site: Secondary | ICD-10-CM | POA: Diagnosis not present

## 2015-05-02 DIAGNOSIS — E7800 Pure hypercholesterolemia, unspecified: Secondary | ICD-10-CM | POA: Insufficient documentation

## 2015-05-02 DIAGNOSIS — H540 Blindness, both eyes: Secondary | ICD-10-CM | POA: Insufficient documentation

## 2015-05-02 DIAGNOSIS — T83098A Other mechanical complication of other indwelling urethral catheter, initial encounter: Secondary | ICD-10-CM | POA: Diagnosis not present

## 2015-05-02 LAB — URINALYSIS, ROUTINE W REFLEX MICROSCOPIC
Bilirubin Urine: NEGATIVE
Glucose, UA: NEGATIVE mg/dL
Ketones, ur: NEGATIVE mg/dL
NITRITE: POSITIVE — AB
Protein, ur: NEGATIVE mg/dL
SPECIFIC GRAVITY, URINE: 1.014 (ref 1.005–1.030)
pH: 8 (ref 5.0–8.0)

## 2015-05-02 LAB — URINE MICROSCOPIC-ADD ON

## 2015-05-02 MED ORDER — CEPHALEXIN 500 MG PO CAPS: 500.0000 mg | ORAL_CAPSULE | Freq: Three times a day (TID) | ORAL | Status: DC

## 2015-05-02 NOTE — Discharge Instructions (Signed)

## 2015-05-02 NOTE — ED Provider Notes (Signed)
CSN: [id number]     Arrival date & time 05/02/15  [id number] History   First MD Initiated Contact with Patient 05/02/15 [phone]     Chief Complaint  Patient presents with  . Urinary Catheter Problem      (Consider location/radiation/quality/duration/timing/severity/associated sxs/prior Treatment) HPI Comments: Patient here complaining decreased urinary output from his Foley catheter. Had a change 9 days ago and has had some suprapubic fullness without flank pain or fever. Denies any hematuria. Symptoms began when he woke up and have been progressively worse. Nothing makes them better or worse and no treatment used prior to arrival. Patient has a history of BPH. Patient arrived by EMS  The history is provided by the patient.    Past Medical History  Diagnosis Date  . Rotator cuff tear     right  . Hypertension   . High cholesterol   . Meningitis   . Enlarged prostate   . 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 Deaf     due to meningitis  . Loss of balance     due to meningitis  . Unable to walk     due to meningitis  . Eczema   . Cancer [facility]) 1956    left testicle  . Skin cancer   . Neurogenic bladder     History of  . Stroke [facility])     mini strokes in hospital with meningitis  . Numbness     in feet due to meningitis   . COPD (chronic obstructive pulmonary disease) ([place])   . Anxiety   . Arthritis     neck    Past Surgical History  Procedure Laterality Date  . Appendectomy    . Prostate surgery      biopsy x 2  . Tonsillectomy      childhood  . Testicle removal Left     October 1956  . Cochlear implant    . Cystoscopy with litholapaxy N/A 05/14/2014    Procedure: CYSTOSCOPY WITH LITHOLAPAXY;  Surgeon: [name], MD;  [facility]: [facility];  Service: Urology;  Laterality: N/A;  . Insertion of suprapubic catheter N/A 05/14/2014    Procedure: INSERTION OF SUPRAPUBIC CATHETER;  Surgeon: [name], MD;  [facility];  Service: Urology;  Laterality: N/A;  . Cystoscopy  with litholapaxy N/A 04/22/2015    Procedure: CYSTOSCOPY WITH LITHOLAPAXY;  Surgeon: [name], MD;  [facility];  Service: Urology;  Laterality: N/A;  . Insertion of suprapubic catheter N/A 04/22/2015    Procedure: INSERTION OF SUPRAPUBIC CATHETER;  Surgeon: [name], MD;  [facility];  Service: Urology;  Laterality: N/A;   Family History  Problem Relation Age of Onset  . Heart failure Father   . Stroke Mother    Social History  Substance Use Topics  . Smoking status: Former Smoker -- 10 years    Types: Cigarettes    Start date: 02/10/1955    Quit date: 02/09/1965  . Smokeless tobacco: Never Used  . Alcohol Use: No    Review of Systems  All other systems reviewed and are negative.     Allergies  Peanut oil and Peanut-containing drug products  Home Medications   Prior to Admission medications   Medication Sig Start Date End Date Taking? Authorizing Provider  amLODipine (NORVASC) 5 MG tablet TAKE 1 TABLET (5 MG TOTAL) BY MOUTH DAILY. 04/24/15   [name], DO  aspirin 81 MG chewable tablet Take one tablet by mouth once daily  Patient taking differently: Chew 81 mg by mouth daily.  02/21/14   [name], DO  atorvastatin (LIPITOR) 10 MG tablet TAKE 1 TABLET (10 MG TOTAL) BY MOUTH AT BEDTIME. 12/31/14   [name], DO  cefdinir (OMNICEF) 300 MG capsule Take 1 capsule (300 mg total) by mouth 2 (two) times daily. 05/01/15   [name], DO  citalopram (CELEXA) 20 MG tablet Take one tablet by mouth once daily for anxiety Patient taking differently: Take 20 mg by mouth daily. for anxiety 04/02/15   [name], DO  Cranberry (ELLURA PO) Take 2 tablets by mouth at bedtime.    Historical Provider, MD  Lactobacillus (ACIDOPHILUS) 100 MG CAPS Take 100 mg by mouth daily.    Historical Provider, MD  magnesium hydroxide (MILK OF MAGNESIA) 400 MG/5ML suspension Take 15 mLs by mouth daily as needed for mild constipation.    Historical Provider, MD    Melatonin 10 MG TABS Take 10 mg by mouth at bedtime.     Historical Provider, MD  pantoprazole (PROTONIX) 40 MG tablet Take one tablet by mouth once daily for stomach Patient taking differently: Take 40 mg by mouth daily.  10/01/14   [name], DO  SEREVENT DISKUS 50 MCG/DOSE diskus inhaler TAKE 1 PUFF TWICE A DAY 03/01/15   [name], DO  silver sulfADIAZINE (SILVADENE) 1 % cream Cleanse wound daily then apply cream to the wound and cover with gauze Patient taking differently: Apply 1 application topically daily. Cleanse wound daily then apply cream to the wound and cover with gauze 03/13/15   Estill Dooms, MD  sodium chloride 1 g tablet Take 1 tablet (1 g total) by mouth 3 (three) times daily with meals. 04/01/15   [name], DO  traZODone (DESYREL) 50 MG tablet Take one tablet by mouth at bedtime as needed for rest Patient taking differently: Take 50 mg by mouth at bedtime as needed. for rest 04/02/15   [name], DO  trimethoprim (TRIMPEX) 100 MG tablet Take 1 tablet (100 mg total) by mouth daily. 04/22/15   [name], MD   BP 135/84 mmHg  Pulse 77  Temp(Src) 97.7 F (36.5 C) (Oral)  Resp 16  SpO2 100% Physical Exam  Constitutional: He is oriented to person, place, and time. He appears well-developed and well-nourished.  Non-toxic appearance. No distress.  HENT:  Head: Normocephalic and atraumatic.  Eyes: Conjunctivae, EOM and lids are normal. Pupils are equal, round, and reactive to light.  Neck: Normal range of motion. Neck supple. No tracheal deviation present. No thyroid mass present.  Cardiovascular: Normal rate, regular rhythm and normal heart sounds.  Exam reveals no gallop.   No murmur heard. Pulmonary/Chest: Effort normal and breath sounds normal. No stridor. No respiratory distress. He has no decreased breath sounds. He has no wheezes. He has no rhonchi. He has no rales.  Abdominal: Soft. Normal appearance and bowel sounds are normal. He exhibits no  distension. There is tenderness in the suprapubic area. There is no rebound and no CVA tenderness.    Musculoskeletal: Normal range of motion. He exhibits no edema or tenderness.  Neurological: He is alert and oriented to person, place, and time. He has normal strength. No cranial nerve deficit or sensory deficit. GCS eye subscore is 4. GCS verbal subscore is 5. GCS motor subscore is 6.  Skin: Skin is warm and dry. No abrasion and no rash noted.  Psychiatric: He has a normal mood and affect. His speech is normal and behavior  is normal.  Nursing note and vitals reviewed.   ED Course  SUPRAPUBIC TUBE PLACEMENT Date/Time: 05/02/2015 9:21 AM Performed by: Lacretia Leigh Authorized by: Lacretia Leigh Consent: Verbal consent obtained. Risks and benefits: risks, benefits and alternatives were discussed Patient identity confirmed: verbally with patient Time out: Immediately prior to procedure a "time out" was called to verify the correct patient, procedure, equipment, support staff and site/side marked as required. Indications: catheter change Local anesthesia used: no Patient sedated: no Preparation: Patient was prepped and draped in the usual sterile fashion. Suprapubic aspiration by: catheter Catheter type: Foley Catheter size: 14 Fr Number of attempts: 1 Urine characteristics: cloudy Patient tolerance: Patient tolerated the procedure well with no immediate complications   (including critical care time) Labs Review Labs Reviewed - No data to display  Imaging Review No results found. I have personally reviewed and evaluated these images and lab results as part of my medical decision-making.   EKG Interpretation None      MDM   Final diagnoses:  None    Patient with evidence of UTI and urine culture sent. Will follow-up with his urologist    Lacretia Leigh, MD 05/02/15 [phone]

## 2015-05-02 NOTE — ED Notes (Signed)
Bed: [id number] Expected date:  Expected time:  Means of arrival:  Comments: 80 yo M  Urinary retention, blocked catheter

## 2015-05-02 NOTE — ED Notes (Signed)
Pt wheeled out. Verbalized understanding discharge instructions. In no acute distress.

## 2015-05-02 NOTE — ED Notes (Addendum)
Pt BIB EMS from home; pt's daughter noticed he had decreased output in his urinary catheter this AM; pt's catheter was replaced 9 days ago when he was hospitalized; pt has enlarged prostate; pt's output into catheter bag is less than 43mL on arrival and was last emptied last night at 2130.  Pt states he has pressure in his lower abdomen and it "feels like I have to urinate". Pt's daughter attempted to irrigate catheter this AM without success

## 2015-05-03 ENCOUNTER — Other Ambulatory Visit: Payer: Self-pay | Admitting: Internal Medicine

## 2015-05-04 LAB — URINE CULTURE

## 2015-05-05 ENCOUNTER — Telehealth (HOSPITAL_BASED_OUTPATIENT_CLINIC_OR_DEPARTMENT_OTHER): Payer: Self-pay | Admitting: Emergency Medicine

## 2015-05-05 NOTE — Telephone Encounter (Signed)
Post ED Visit - Positive Culture Follow-up  Culture report reviewed by antimicrobial stewardship pharmacist:  []  Elenor Quinones, Pharm.D. []  Heide Guile, Pharm.D., BCPS []  Parks Neptune, Pharm.D. []  Alycia Rossetti, Pharm.D., BCPS []  [place], Pharm.D., BCPS, AAHIVP []  Legrand Como, Pharm.D., BCPS, AAHIVP []  Milus Glazier, Pharm.D. []  Rob Humboldt, Pharm.D. Salome Arnt PharmD  Positive urine culture Proteus Treated with cephalexin, organism sensitive to the same and no further patient follow-up is required at this time.  [name] 05/05/2015, 12:31 PM

## 2015-05-13 DIAGNOSIS — N319 Neuromuscular dysfunction of bladder, unspecified: Secondary | ICD-10-CM | POA: Diagnosis not present

## 2015-05-13 DIAGNOSIS — L03115 Cellulitis of right lower limb: Secondary | ICD-10-CM | POA: Diagnosis not present

## 2015-05-13 DIAGNOSIS — J449 Chronic obstructive pulmonary disease, unspecified: Secondary | ICD-10-CM | POA: Diagnosis not present

## 2015-05-13 DIAGNOSIS — B964 Proteus (mirabilis) (morganii) as the cause of diseases classified elsewhere: Secondary | ICD-10-CM | POA: Diagnosis not present

## 2015-05-13 DIAGNOSIS — G629 Polyneuropathy, unspecified: Secondary | ICD-10-CM | POA: Diagnosis not present

## 2015-05-13 DIAGNOSIS — R2681 Unsteadiness on feet: Secondary | ICD-10-CM | POA: Diagnosis not present

## 2015-05-13 DIAGNOSIS — I10 Essential (primary) hypertension: Secondary | ICD-10-CM | POA: Diagnosis not present

## 2015-05-13 DIAGNOSIS — N390 Urinary tract infection, site not specified: Secondary | ICD-10-CM | POA: Diagnosis not present

## 2015-05-13 DIAGNOSIS — R296 Repeated falls: Secondary | ICD-10-CM | POA: Diagnosis not present

## 2015-05-13 DIAGNOSIS — N400 Enlarged prostate without lower urinary tract symptoms: Secondary | ICD-10-CM | POA: Diagnosis not present

## 2015-05-14 DIAGNOSIS — Z9359 Other cystostomy status: Secondary | ICD-10-CM | POA: Diagnosis not present

## 2015-05-14 DIAGNOSIS — R338 Other retention of urine: Secondary | ICD-10-CM | POA: Diagnosis not present

## 2015-05-14 DIAGNOSIS — N3289 Other specified disorders of bladder: Secondary | ICD-10-CM | POA: Diagnosis not present

## 2015-05-18 ENCOUNTER — Other Ambulatory Visit: Payer: Self-pay | Admitting: Internal Medicine

## 2015-05-31 ENCOUNTER — Telehealth: Payer: Self-pay

## 2015-05-31 DIAGNOSIS — N390 Urinary tract infection, site not specified: Secondary | ICD-10-CM

## 2015-05-31 DIAGNOSIS — B964 Proteus (mirabilis) (morganii) as the cause of diseases classified elsewhere: Secondary | ICD-10-CM

## 2015-05-31 DIAGNOSIS — R338 Other retention of urine: Secondary | ICD-10-CM | POA: Diagnosis not present

## 2015-05-31 NOTE — Telephone Encounter (Signed)
Patient's daughter call to get a status update on referral to Urology. I informed [name] that we referred patient to Urologist in February 2017. [name] stated she had a conversation at last appointment about getting a second opinion to another urologist in a different group.   Please advise

## 2015-05-31 NOTE — Telephone Encounter (Signed)
Ok for referral to urology for second opinion of neurogenic bladder

## 2015-06-03 NOTE — Telephone Encounter (Signed)
Referral order placed. Message will be sent to [facility] (referral coordinator) as a Juluis Rainier

## 2015-06-04 ENCOUNTER — Other Ambulatory Visit: Payer: Self-pay

## 2015-06-04 DIAGNOSIS — Z85820 Personal history of malignant melanoma of skin: Secondary | ICD-10-CM | POA: Diagnosis not present

## 2015-06-04 DIAGNOSIS — L249 Irritant contact dermatitis, unspecified cause: Secondary | ICD-10-CM | POA: Diagnosis not present

## 2015-06-04 DIAGNOSIS — Z08 Encounter for follow-up examination after completed treatment for malignant neoplasm: Secondary | ICD-10-CM | POA: Diagnosis not present

## 2015-06-04 MED ORDER — AMLODIPINE BESYLATE 5 MG PO TABS: ORAL_TABLET | ORAL | Status: DC

## 2015-06-04 MED ORDER — ATORVASTATIN CALCIUM 10 MG PO TABS: ORAL_TABLET | ORAL | Status: DC

## 2015-06-04 MED ORDER — PANTOPRAZOLE SODIUM 40 MG PO TBEC: DELAYED_RELEASE_TABLET | ORAL | Status: DC

## 2015-06-06 ENCOUNTER — Other Ambulatory Visit: Payer: Self-pay | Admitting: Internal Medicine

## 2015-06-21 DIAGNOSIS — R339 Retention of urine, unspecified: Secondary | ICD-10-CM | POA: Diagnosis not present

## 2015-06-21 DIAGNOSIS — N319 Neuromuscular dysfunction of bladder, unspecified: Secondary | ICD-10-CM | POA: Diagnosis not present

## 2015-06-21 DIAGNOSIS — N3020 Other chronic cystitis without hematuria: Secondary | ICD-10-CM | POA: Diagnosis not present

## 2015-07-10 ENCOUNTER — Encounter: Payer: Self-pay | Admitting: Internal Medicine

## 2015-07-10 ENCOUNTER — Ambulatory Visit (INDEPENDENT_AMBULATORY_CARE_PROVIDER_SITE_OTHER): Payer: Medicare HMO | Admitting: Internal Medicine

## 2015-07-10 VITALS — BP 139/68 | HR 71 | Temp 97.7°F

## 2015-07-10 DIAGNOSIS — E871 Hypo-osmolality and hyponatremia: Secondary | ICD-10-CM | POA: Diagnosis not present

## 2015-07-10 DIAGNOSIS — N390 Urinary tract infection, site not specified: Secondary | ICD-10-CM | POA: Diagnosis not present

## 2015-07-10 DIAGNOSIS — J449 Chronic obstructive pulmonary disease, unspecified: Secondary | ICD-10-CM | POA: Diagnosis not present

## 2015-07-10 DIAGNOSIS — I10 Essential (primary) hypertension: Secondary | ICD-10-CM | POA: Diagnosis not present

## 2015-07-10 DIAGNOSIS — Z9289 Personal history of other medical treatment: Secondary | ICD-10-CM

## 2015-07-10 DIAGNOSIS — E785 Hyperlipidemia, unspecified: Secondary | ICD-10-CM | POA: Diagnosis not present

## 2015-07-10 DIAGNOSIS — F411 Generalized anxiety disorder: Secondary | ICD-10-CM | POA: Diagnosis not present

## 2015-07-10 DIAGNOSIS — Z960 Presence of urogenital implants: Secondary | ICD-10-CM

## 2015-07-10 DIAGNOSIS — Z978 Presence of other specified devices: Secondary | ICD-10-CM

## 2015-07-10 DIAGNOSIS — R69 Illness, unspecified: Secondary | ICD-10-CM | POA: Diagnosis not present

## 2015-07-10 MED ORDER — TETANUS-DIPHTH-ACELL PERTUSSIS 5-2.5-18.5 LF-MCG/0.5 IM SUSP: 0.5000 mL | Freq: Once | INTRAMUSCULAR | Status: DC

## 2015-07-10 NOTE — Patient Instructions (Addendum)
Recommend podiatry referral in [place] current medications as ordered  Will call with lab results  Follow up in 3 mos for CPE

## 2015-07-10 NOTE — Progress Notes (Signed)
Patient ID: Ricardo [name], male   DOB: [birth year], 80 y.o.   MRN: [id number]    Location:  [facility] Place of Service: OFFICE  Chief Complaint  Patient presents with  . Medical Management of Chronic Issues    2 months follow up    HPI:  80 yo male seen today for f/u. He still has soreness in right 5th toe. Applying silvadene as rx. He has never seen podiatrist. He needs sodium level checked. He is taking salt tablets for hyponatremia. He plans to travel to [place] next week and will be gone for 2 mos. Plans to have [place] come out there.  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   He continues to have difficulty sleeping.   GERD sx's stable on protonix. Constipation stable on miralax. He takes probiotic daily.   Cholesterol stable on lipitor.   HTN - BP stable on losartan  Neurogenic bladder - foley cath is chronic. Takes trimethoprim for prevention. He has recurrent UTI hx and had 2nd opinion with Dr [name] and goes for 1st foley change tomorrow. He is taking cranberry po daily. underwent cystourethroscopy and suprapubic cath re-insertion by Dr [name] 3/20th.    GAD/insomnia - mood stable on celexa. Takes trazodone qhs to help sleep. He also takes melatonin  COPD - stable on serevent  Past Medical History  Diagnosis Date  . Rotator cuff tear     right  . Hypertension   . High cholesterol   . Meningitis   . Enlarged prostate   . 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 Deaf     due to meningitis  . Loss of balance     due to meningitis  . Unable to walk     due to meningitis  . Eczema   . Cancer [facility]) 1956    left testicle  . Skin cancer   . Neurogenic bladder     History of  . Stroke [facility])     mini strokes in hospital with meningitis  . Numbness     in feet due to meningitis   . COPD (chronic obstructive pulmonary disease) ([place])   . Anxiety   . Arthritis     neck     Past Surgical History  Procedure Laterality Date  . Appendectomy    . Prostate  surgery      biopsy x 2  . Tonsillectomy      childhood  . Testicle removal Left     October 1956  . Cochlear implant    . Cystoscopy with litholapaxy N/A 05/14/2014    Procedure: CYSTOSCOPY WITH LITHOLAPAXY;  Surgeon: [name], MD;  Location: [facility];  Service: Urology;  Laterality: N/A;  . Insertion of suprapubic catheter N/A 05/14/2014    Procedure: INSERTION OF SUPRAPUBIC CATHETER;  Surgeon: [name], MD;  Location: [facility];  Service: Urology;  Laterality: N/A;  . Cystoscopy with litholapaxy N/A 04/22/2015    Procedure: CYSTOSCOPY WITH LITHOLAPAXY;  Surgeon: [name], MD;  Location: [facility];  Service: Urology;  Laterality: N/A;  . Insertion of suprapubic catheter N/A 04/22/2015    Procedure: INSERTION OF SUPRAPUBIC CATHETER;  Surgeon: [name], MD;  Location: [facility];  Service: Urology;  Laterality: N/A;    Patient Care Team: [name], DO as PCP - General (Internal Medicine) [name], MD as Consulting Physician (Urology)  Social History   Social History  . Marital Status: Widowed  Spouse Name: N/A  . Number of Children: N/A  . Years of Education: N/A   Occupational History  . Not on file.   Social History Main Topics  . Smoking status: Former Smoker -- 10 years    Types: Cigarettes    Start date: 02/10/1955    Quit date: 02/09/1965  . Smokeless tobacco: Never Used  . Alcohol Use: No  . Drug Use: No  . Sexual Activity: Not on file   Other Topics Concern  . Not on file   Social History Narrative   Diet- Fairly balanced   Caffeine- Yes, Coffee   Married- 1964, [place] living now with daughter, all on the first floor   Pets-4, dog and Radiation protection practitioner, English as a second language teacher for [employer], Chief Financial Officer, Freight forwarder   Exercise- Yes, Rehab 3 times a week   Living will- Yes   DNR- Yes   POA/HPOA- Yes           reports that he quit smoking about 50 years ago. His smoking use included Cigarettes. He  started smoking about 60 years ago. He quit after 10 years of use. He has never used smokeless tobacco. He reports that he does not drink alcohol or use illicit drugs.  Family History  Problem Relation Age of Onset  . Heart failure Father   . Stroke Mother    Family Status  Relation Status Death Age  . Mother Deceased 57  . Father Deceased 63  . Daughter Alive   . Son Alive 74     Allergies  Allergen Reactions  . Peanut Oil Shortness Of Breath and Swelling    Itching  . Peanut-Containing Drug Products Shortness Of Breath and Swelling    Medications: Patient's Medications  New Prescriptions   No medications on file  Previous Medications   AMLODIPINE (NORVASC) 5 MG TABLET    TAKE 1 TABLET (5 MG TOTAL) BY MOUTH DAILY.   ASPIRIN 81 MG CHEWABLE TABLET    Take one tablet by mouth once daily   ATORVASTATIN (LIPITOR) 10 MG TABLET    TAKE 1 TABLET (10 MG TOTAL) BY MOUTH AT BEDTIME.   CITALOPRAM (CELEXA) 20 MG TABLET    Take one tablet by mouth once daily for anxiety   CITALOPRAM (CELEXA) 20 MG TABLET    TAKE 1 TABLET (20 MG TOTAL) BY MOUTH DAILY.   CRANBERRY (ELLURA PO)    Take 2 tablets by mouth at bedtime.   LACTOBACILLUS (ACIDOPHILUS) 100 MG CAPS    Take 100 mg by mouth daily.   MAGNESIUM HYDROXIDE (MILK OF MAGNESIA) 400 MG/5ML SUSPENSION    Take 15 mLs by mouth daily as needed for mild constipation.   MELATONIN 10 MG TABS    Take 10 mg by mouth at bedtime.    NAPROXEN SODIUM (ANAPROX) 220 MG TABLET    Take 220 mg by mouth as needed.   PANTOPRAZOLE (PROTONIX) 40 MG TABLET    Take one tablet by mouth once daily for stomach   SEREVENT DISKUS 50 MCG/DOSE DISKUS INHALER    TAKE 1 PUFF TWICE A DAY   SILVER SULFADIAZINE (SILVADENE) 1 % CREAM    Cleanse wound daily then apply cream to the wound and cover with gauze   SODIUM CHLORIDE 1 G TABLET    TAKE 1 TABLET (1 G TOTAL) BY MOUTH 3 (THREE) TIMES DAILY WITH MEALS.*OTC   TRAZODONE (DESYREL) 50 MG TABLET    Take one tablet by mouth at  bedtime  as needed for rest   TRAZODONE (DESYREL) 50 MG TABLET    TAKE 1 TABLET (50 MG TOTAL) BY MOUTH AT BEDTIME.   TRIMETHOPRIM (TRIMPEX) 100 MG TABLET    Take 1 tablet (100 mg total) by mouth daily.  Modified Medications   Modified Medication Previous Medication   TDAP (BOOSTRIX) 5-2.5-18.5 LF-MCG/0.5 INJECTION Tdap (BOOSTRIX) 5-2.5-18.5 LF-MCG/0.5 injection      Inject 0.5 mLs into the muscle once.    Inject 0.5 mLs into the muscle once.  Discontinued Medications   ACETAMINOPHEN (TYLENOL) 500 MG TABLET    Take 500 mg by mouth every 6 (six) hours as needed for moderate pain. Reported on 07/10/2015   CEFDINIR (OMNICEF) 300 MG CAPSULE    Take 1 capsule (300 mg total) by mouth 2 (two) times daily.   CEPHALEXIN (KEFLEX) 500 MG CAPSULE    Take 1 capsule (500 mg total) by mouth 3 (three) times daily.    Review of Systems  Constitutional: Positive for appetite change.  Eyes: Positive for visual disturbance.  Genitourinary: Positive for difficulty urinating.  Musculoskeletal: Positive for joint swelling, arthralgias and gait problem.  Skin: Positive for wound.  Neurological: Positive for weakness.  All other systems reviewed and are negative.   Filed Vitals:   07/10/15 1023  BP: 139/68  Pulse: 71  Temp: 97.7 F (36.5 C)  TempSrc: Oral  SpO2: 94%   There is no weight on file to calculate BMI.  Physical Exam  Constitutional: He is oriented to person, place, and time. He appears well-developed.  Frail appearing in NAD, sitting in w/c  HENT:  Mouth/Throat: Oropharynx is clear and moist.  Eyes: Pupils are equal, round, and reactive to light. No scleral icterus.  Blind in [place]  Neck: Neck supple. Carotid bruit is not present. No thyromegaly present.  Cardiovascular: Normal rate, regular rhythm and intact distal pulses.  Exam reveals no gallop and no friction rub.   Murmur (1/6 SEM) heard. Trace LE edema b/l. No calf TTP  Pulmonary/Chest: Effort normal and breath sounds normal. He has no  wheezes. He has no rales. He exhibits no tenderness.  Abdominal: Soft. Bowel sounds are normal. He exhibits no distension, no abdominal bruit, no pulsatile midline mass and no mass. There is no tenderness. There is no rebound and no guarding.  Genitourinary:  Suprapubic cath intact with foley DTG with sediment changes noted  Musculoskeletal: He exhibits edema and tenderness.  Lymphadenopathy:    He has no cervical adenopathy.  Neurological: He is alert and oriented to person, place, and time.  Skin: Skin is warm and dry. No rash noted.  right 5th toe redness and swelling extending proximally to metatarsal; no increased warmth to touch; no d/c; TTP  Psychiatric: He has a normal mood and affect. His behavior is normal. Thought content normal.     Labs reviewed: Admission on 05/02/2015, Discharged on 05/02/2015  Component Date Value Ref Range Status  . Color, Urine 05/02/2015 YELLOW  YELLOW Final  . APPearance 05/02/2015 TURBID* CLEAR Final  . Specific Gravity, Urine 05/02/2015 1.014  1.005 - 1.030 Final  . pH 05/02/2015 8.0  5.0 - 8.0 Final  . Glucose, UA 05/02/2015 NEGATIVE  NEGATIVE mg/dL Final  . Hgb urine dipstick 05/02/2015 SMALL* NEGATIVE Final  . Bilirubin Urine 05/02/2015 NEGATIVE  NEGATIVE Final  . Ketones, ur 05/02/2015 NEGATIVE  NEGATIVE mg/dL Final  . Protein, ur 05/02/2015 NEGATIVE  NEGATIVE mg/dL Final  . Nitrite 05/02/2015 POSITIVE* NEGATIVE Final  . Leukocytes, UA 05/02/2015  LARGE* NEGATIVE Final  . Specimen Description 05/02/2015 URINE, CATHETERIZED   Final  . Special Requests 05/02/2015 NONE   Final  . Culture 05/02/2015    Final                   Value:>=100,000 COLONIES/mL PROTEUS MIRABILIS Performed at [facility]   . Report Status 05/02/2015 05/04/2015 FINAL   Final  . Organism ID, Bacteria 05/02/2015 PROTEUS MIRABILIS   Final  . Squamous Epithelial / LPF 05/02/2015 0-5* NONE SEEN Final  . WBC, UA 05/02/2015 6-30  0 - 5 WBC/hpf Final  . RBC / HPF  05/02/2015 TOO NUMEROUS TO COUNT  0 - 5 RBC/hpf Final  . Bacteria, UA 05/02/2015 MANY* NONE SEEN Final  . Urine-Other 05/02/2015 AMORPHOUS URATES/PHOSPHATES   Final  Hospital Outpatient Visit on 04/15/2015  Component Date Value Ref Range Status  . WBC 04/15/2015 13.9* 4.0 - 10.5 K/uL Final  . RBC 04/15/2015 4.29  4.22 - 5.81 MIL/uL Final  . Hemoglobin 04/15/2015 12.9* 13.0 - 17.0 g/dL Final  . HCT 04/15/2015 40.0  39.0 - 52.0 % Final  . MCV 04/15/2015 93.2  78.0 - 100.0 fL Final  . MCH 04/15/2015 30.1  26.0 - 34.0 pg Final  . MCHC 04/15/2015 32.3  30.0 - 36.0 g/dL Final  . RDW 04/15/2015 14.2  11.5 - 15.5 % Final  . Platelets 04/15/2015 274  150 - 400 K/uL Final  . Sodium 04/15/2015 138  135 - 145 mmol/L Final  . Potassium 04/15/2015 4.4  3.5 - 5.1 mmol/L Final  . Chloride 04/15/2015 102  101 - 111 mmol/L Final  . CO2 04/15/2015 26  22 - 32 mmol/L Final  . Glucose, Bld 04/15/2015 107* 65 - 99 mg/dL Final  . BUN 04/15/2015 17  6 - 20 mg/dL Final  . Creatinine, Ser 04/15/2015 0.93  0.61 - 1.24 mg/dL Final  . Calcium 04/15/2015 9.1  8.9 - 10.3 mg/dL Final  . GFR calc non Af Amer 04/15/2015 >60  >60 mL/min Final  . GFR calc Af Amer 04/15/2015 >60  >60 mL/min Final   Comment: (NOTE) The eGFR has been calculated using the CKD EPI equation. This calculation has not been validated in all clinical situations. eGFR's persistently <60 mL/min signify possible Chronic Kidney Disease.   . Anion gap 04/15/2015 10  5 - 15 Final    No results found.   Assessment/Plan   ICD-9-CM ICD-10-CM   1. Hyponatremia 276.1 W54.6 Basic Metabolic Panel  2. Foley catheter in place V45.89 Z92.89   3. Recurrent UTI 599.0 N39.0   4. Essential hypertension 401.9 I10 ALT  5. Chronic obstructive pulmonary disease, unspecified COPD, unspecified chronic bronchitis type 496 J44.9   6. Generalized anxiety disorder 300.02 F41.1 ALT  7. Hyperlipidemia 272.4 E78.5 Lipid Panel     TSH    Recommend podiatry  referral in [place] current medications as ordered  Will call with lab results  F/u with urology as scheduled  Follow up in 3 mos for [place]. [name]  [facility] [address] [place], [place] [address] [phone] Cell (Monday-Friday 8 AM - 5 PM) [phone] After 5 PM and follow prompts

## 2015-07-11 DIAGNOSIS — R339 Retention of urine, unspecified: Secondary | ICD-10-CM | POA: Diagnosis not present

## 2015-07-11 LAB — ALT: ALT: 12 IU/L (ref 0–44)

## 2015-07-11 LAB — BASIC METABOLIC PANEL
BUN/Creatinine Ratio: 17 (ref 10–24)
BUN: 19 mg/dL (ref 8–27)
CHLORIDE: 97 mmol/L (ref 96–106)
CO2: 21 mmol/L (ref 18–29)
Calcium: 9.4 mg/dL (ref 8.6–10.2)
Creatinine, Ser: 1.15 mg/dL (ref 0.76–1.27)
GFR calc Af Amer: 71 mL/min/{1.73_m2} (ref >59)
GFR, EST NON AFRICAN AMERICAN: 61 mL/min/{1.73_m2} (ref >59)
GLUCOSE: 98 mg/dL (ref 65–99)
POTASSIUM: 4.4 mmol/L (ref 3.5–5.2)
SODIUM: 139 mmol/L (ref 134–144)

## 2015-07-11 LAB — LIPID PANEL
CHOLESTEROL TOTAL: 159 mg/dL (ref 100–199)
Chol/HDL Ratio: 2.7 ratio units (ref 0.0–5.0)
HDL: 60 mg/dL (ref >39)
LDL CALC: 78 mg/dL (ref 0–99)
Triglycerides: 105 mg/dL (ref 0–149)
VLDL Cholesterol Cal: 21 mg/dL (ref 5–40)

## 2015-07-11 LAB — TSH: TSH: 1.75 u[IU]/mL (ref 0.450–4.500)

## 2015-07-12 NOTE — Telephone Encounter (Signed)
Error

## 2015-07-22 DIAGNOSIS — F339 Major depressive disorder, recurrent, unspecified: Secondary | ICD-10-CM

## 2015-07-22 DIAGNOSIS — R69 Illness, unspecified: Secondary | ICD-10-CM | POA: Diagnosis not present

## 2015-07-22 DIAGNOSIS — E785 Hyperlipidemia, unspecified: Secondary | ICD-10-CM | POA: Diagnosis not present

## 2015-07-22 DIAGNOSIS — G4700 Insomnia, unspecified: Secondary | ICD-10-CM | POA: Diagnosis not present

## 2015-07-22 DIAGNOSIS — H918X3 Other specified hearing loss, bilateral: Secondary | ICD-10-CM | POA: Diagnosis not present

## 2015-07-22 DIAGNOSIS — I10 Essential (primary) hypertension: Secondary | ICD-10-CM | POA: Diagnosis not present

## 2015-07-22 DIAGNOSIS — J449 Chronic obstructive pulmonary disease, unspecified: Secondary | ICD-10-CM | POA: Diagnosis not present

## 2015-07-22 DIAGNOSIS — H548 Legal blindness, as defined in USA: Secondary | ICD-10-CM | POA: Diagnosis not present

## 2015-07-22 DIAGNOSIS — Z87440 Personal history of urinary (tract) infections: Secondary | ICD-10-CM | POA: Diagnosis not present

## 2015-07-22 DIAGNOSIS — F064 Anxiety disorder due to known physiological condition: Secondary | ICD-10-CM | POA: Diagnosis not present

## 2015-07-22 DIAGNOSIS — K5900 Constipation, unspecified: Secondary | ICD-10-CM | POA: Diagnosis not present

## 2015-07-22 DIAGNOSIS — G009 Bacterial meningitis, unspecified: Secondary | ICD-10-CM | POA: Diagnosis not present

## 2015-07-22 DIAGNOSIS — K219 Gastro-esophageal reflux disease without esophagitis: Secondary | ICD-10-CM | POA: Diagnosis not present

## 2015-07-22 DIAGNOSIS — L03115 Cellulitis of right lower limb: Secondary | ICD-10-CM | POA: Diagnosis not present

## 2015-07-23 ENCOUNTER — Telehealth: Payer: Self-pay | Admitting: *Deleted

## 2015-07-23 ENCOUNTER — Encounter: Payer: Self-pay | Admitting: Urology

## 2015-07-23 ENCOUNTER — Ambulatory Visit: Payer: Self-pay | Admitting: Urology

## 2015-07-23 ENCOUNTER — Telehealth: Payer: Self-pay | Admitting: Urology

## 2015-07-23 VITALS — BP 154/72 | HR 72 | Temp 97.2°F | Ht 73.0 in | Wt 183.0 lb

## 2015-07-23 DIAGNOSIS — N319 Neuromuscular dysfunction of bladder, unspecified: Secondary | ICD-10-CM

## 2015-07-23 NOTE — Telephone Encounter (Signed)
yes

## 2015-07-23 NOTE — Telephone Encounter (Signed)
Rx losartan 25mg  #30 take 1 tab po daily with 3RF. Cont amlodipine as ordered

## 2015-07-23 NOTE — Telephone Encounter (Signed)
Is she talking about resuming losartan?

## 2015-07-23 NOTE — Telephone Encounter (Signed)
Patient daughter, [name] called and stated that patient's BP has been creeping up. Yesterday it was 162/92 and staying on the high side. Wants to know if his BP medication needs to be added back. They are in [place] for a couple of months visiting. [facility]. Please Advise.

## 2015-07-23 NOTE — Progress Notes (Signed)
Chief complaint: Neurogenic bladder    History of present illness: This patient has a history of a neurogenic bladder and has a chronic suprapubic tube in place.  The patient resides in [place] and spends 2 months in the summer and the [place] area.  The patient has a #20 [place] catheter that is changed every 4 weeks.  It was last changed 2 weeks ago.  Patient is followed by a urologist in [place].  He currently irrigates his bladder twice a week neomycin and polymyxin solution, and this has worked  well in maintaining patency.  The patient is here today to see about setting up the visiting nurse referral for monthly cath change while in this area.  He has used [facility]  in the past.    Medications:   Current Outpatient Prescriptions   Medication    Lactobacillus (ACIDOPHILUS) 100 MG CAPS    amLODIPine (NORVASC) 5 MG tablet    citalopram (CELEXA) 20 MG tablet    Cranberry (ELLURA PO)    Melatonin 10 MG CAPS    neomycin-polymyxin B (NEOSPORIN) 40-200000 irrigation solution    triamcinolone (KENALOG) 0.1 % cream    silver sulfADIAZINE (SILVADENE) 1 % cream    traZODone (DESYREL) 50 MG tablet    atorvastatin (LIPITOR) 10 MG tablet    pantoprazole (PROTONIX) 40 MG EC tablet    [facility] ASPIRIN 81 MG chewable tablet    SEREVENT DISKUS 50 MCG/DOSE diskus inhaler    trimethoprim (TRIMPEX) 100 MG tablet    aspirin 81 MG tablet    Multiple Vitamins-Minerals (SENIOR MULTIVITAMIN PLUS) TABS    BENICAR HCT 40-25 MG per tablet     No current facility-administered medications for this visit.        Allergies:   Allergies   Allergen Reactions    Peanut-Derived      Throat swells      No Known Drug Allergy      Created by Conversion - 0;        Past medical history:   Past Medical History:   Diagnosis Date    Asthma     Cerebral artery occlusion with cerebral infarction     COPD (chronic obstructive pulmonary disease)     COPD (chronic obstructive pulmonary disease)      Hyperlipidemia     Hypertension     Meningitis     Testicle cancer        Past surgical history:   Past Surgical History:   Procedure Laterality Date    APPENDECTOMY      COCHLEAR IMPLANT      left ear    EYE SURGERY      PROSTATE SURGERY      superpubic cath      TESTICLE REMOVAL      TONSILLECTOMY AND ADENOIDECTOMY         Family history:   Family History   Problem Relation Age of Onset    Heart Disease Father        Social History:   Social History     Social History    Marital status: Widowed     Spouse name: N/A    Number of children: N/A    Years of education: N/A     Occupational History    Not on file.     Social History Main Topics    Smoking status: Former Smoker     Packs/day: 1.00     Years: 10.00  Types: Cigarettes     Quit date: 11/17/1965    Smokeless tobacco: Not on file    Alcohol use Not on file    Drug use: Not on file    Sexual activity: Not on file     Social History Narrative         REVIEW of SYSTEMS    Constitutional: Negative.  HEENT: Negative  Respiratory: Negative  Cardiac: Negative  GI: Negative  GU: See above.  Musculoskeletal: Negative  Neurological: Negative  Hematological: Negative  Behavorial: Negative  Skin: Negative  Endocrine:Negative  Vascular:Negative    Vital signs: Blood pressure 154/72, pulse 72, temperature 36.2 C (97.2 F), temperature source Temporal, height 1.854 m (6\' 1" ), weight 83 kg (183 lb).    Physical examination:    This patient is wheelchair bound, His suprapubic tube is patent draining clear yellow urine.      Assessment: The patient has a history of neurogenic bladder and has chronic suprapubic cath drainage.  He is residing in the [place] area for the summer and would like a referral for [facility] to change the cathter once a month.    Plan:Referral made to [facility].  If that are not able to se the patient, then he will come here once a month for SP catheter change.      Scheryl Marten, NP 07/23/2015 5:57 PM

## 2015-07-23 NOTE — Telephone Encounter (Signed)
Andrea-Daughter, is calling to inform the office that the patient will be running late. They went to [facility] instead of [facility]. If necessary she can be reached at [phone]

## 2015-07-24 ENCOUNTER — Telehealth: Payer: Self-pay | Admitting: Urology

## 2015-07-24 MED ORDER — LOSARTAN POTASSIUM 25 MG PO TABS: ORAL_TABLET | ORAL | Status: DC

## 2015-07-24 NOTE — Telephone Encounter (Signed)
Left message with [name] and faxed Rx to pharmacy.

## 2015-07-24 NOTE — Telephone Encounter (Signed)
Hoyle Sauer from [facility] called stated received phone call from Robb Matar, NP regarding care for patient but no instructions were provided. Please contact [phone]

## 2015-07-31 DIAGNOSIS — M79671 Pain in right foot: Secondary | ICD-10-CM | POA: Diagnosis not present

## 2015-07-31 DIAGNOSIS — L97511 Non-pressure chronic ulcer of other part of right foot limited to breakdown of skin: Secondary | ICD-10-CM | POA: Diagnosis not present

## 2015-07-31 DIAGNOSIS — L089 Local infection of the skin and subcutaneous tissue, unspecified: Secondary | ICD-10-CM | POA: Diagnosis not present

## 2015-08-07 DIAGNOSIS — N312 Flaccid neuropathic bladder, not elsewhere classified: Secondary | ICD-10-CM | POA: Diagnosis not present

## 2015-08-07 DIAGNOSIS — I69398 Other sequelae of cerebral infarction: Secondary | ICD-10-CM | POA: Diagnosis not present

## 2015-08-07 DIAGNOSIS — J45909 Unspecified asthma, uncomplicated: Secondary | ICD-10-CM | POA: Diagnosis not present

## 2015-08-07 DIAGNOSIS — R69 Illness, unspecified: Secondary | ICD-10-CM | POA: Diagnosis not present

## 2015-08-07 DIAGNOSIS — Z9181 History of falling: Secondary | ICD-10-CM | POA: Diagnosis not present

## 2015-08-07 DIAGNOSIS — Z7982 Long term (current) use of aspirin: Secondary | ICD-10-CM | POA: Diagnosis not present

## 2015-08-07 DIAGNOSIS — I10 Essential (primary) hypertension: Secondary | ICD-10-CM | POA: Diagnosis not present

## 2015-08-07 DIAGNOSIS — L89892 Pressure ulcer of other site, stage 2: Secondary | ICD-10-CM | POA: Diagnosis not present

## 2015-08-07 DIAGNOSIS — Z436 Encounter for attention to other artificial openings of urinary tract: Secondary | ICD-10-CM | POA: Diagnosis not present

## 2015-08-07 DIAGNOSIS — J449 Chronic obstructive pulmonary disease, unspecified: Secondary | ICD-10-CM | POA: Diagnosis not present

## 2015-08-13 ENCOUNTER — Ambulatory Visit: Payer: Self-pay | Admitting: Urology

## 2015-08-19 DIAGNOSIS — L97509 Non-pressure chronic ulcer of other part of unspecified foot with unspecified severity: Secondary | ICD-10-CM | POA: Diagnosis not present

## 2015-08-19 DIAGNOSIS — M79671 Pain in right foot: Secondary | ICD-10-CM | POA: Diagnosis not present

## 2015-08-19 DIAGNOSIS — L97511 Non-pressure chronic ulcer of other part of right foot limited to breakdown of skin: Secondary | ICD-10-CM | POA: Diagnosis not present

## 2015-08-24 ENCOUNTER — Other Ambulatory Visit: Payer: Self-pay | Admitting: Internal Medicine

## 2015-08-27 ENCOUNTER — Other Ambulatory Visit: Payer: Self-pay

## 2015-08-27 MED ORDER — SALMETEROL XINAFOATE 50 MCG/DOSE IN AEPB: INHALATION_SPRAY | RESPIRATORY_TRACT | 1 refills | Status: DC

## 2015-08-27 MED ORDER — LOSARTAN POTASSIUM 25 MG PO TABS: ORAL_TABLET | ORAL | 1 refills | Status: DC

## 2015-09-06 DIAGNOSIS — L89892 Pressure ulcer of other site, stage 2: Secondary | ICD-10-CM | POA: Diagnosis not present

## 2015-09-06 DIAGNOSIS — Z7982 Long term (current) use of aspirin: Secondary | ICD-10-CM | POA: Diagnosis not present

## 2015-09-06 DIAGNOSIS — I10 Essential (primary) hypertension: Secondary | ICD-10-CM | POA: Diagnosis not present

## 2015-09-06 DIAGNOSIS — N312 Flaccid neuropathic bladder, not elsewhere classified: Secondary | ICD-10-CM | POA: Diagnosis not present

## 2015-09-06 DIAGNOSIS — Z436 Encounter for attention to other artificial openings of urinary tract: Secondary | ICD-10-CM | POA: Diagnosis not present

## 2015-09-06 DIAGNOSIS — I69398 Other sequelae of cerebral infarction: Secondary | ICD-10-CM | POA: Diagnosis not present

## 2015-09-06 DIAGNOSIS — J449 Chronic obstructive pulmonary disease, unspecified: Secondary | ICD-10-CM | POA: Diagnosis not present

## 2015-09-06 DIAGNOSIS — R69 Illness, unspecified: Secondary | ICD-10-CM | POA: Diagnosis not present

## 2015-09-06 DIAGNOSIS — Z9181 History of falling: Secondary | ICD-10-CM | POA: Diagnosis not present

## 2015-09-06 DIAGNOSIS — J45909 Unspecified asthma, uncomplicated: Secondary | ICD-10-CM | POA: Diagnosis not present

## 2015-09-08 ENCOUNTER — Other Ambulatory Visit: Payer: Self-pay | Admitting: Internal Medicine

## 2015-09-22 ENCOUNTER — Other Ambulatory Visit: Payer: Self-pay | Admitting: Internal Medicine

## 2015-09-23 ENCOUNTER — Other Ambulatory Visit: Payer: Self-pay | Admitting: *Deleted

## 2015-09-23 MED ORDER — TRAZODONE HCL 50 MG PO TABS: ORAL_TABLET | ORAL | 0 refills | Status: DC

## 2015-09-23 MED ORDER — CITALOPRAM HYDROBROMIDE 20 MG PO TABS: ORAL_TABLET | ORAL | 1 refills | Status: DC

## 2015-09-23 NOTE — Telephone Encounter (Signed)
[facility] 

## 2015-10-04 DIAGNOSIS — Z9359 Other cystostomy status: Secondary | ICD-10-CM | POA: Diagnosis not present

## 2015-10-11 ENCOUNTER — Encounter: Payer: Self-pay | Admitting: Internal Medicine

## 2015-10-11 ENCOUNTER — Ambulatory Visit (INDEPENDENT_AMBULATORY_CARE_PROVIDER_SITE_OTHER): Payer: Medicare HMO | Admitting: Internal Medicine

## 2015-10-11 VITALS — BP 124/76 | HR 77 | Temp 97.7°F

## 2015-10-11 DIAGNOSIS — E785 Hyperlipidemia, unspecified: Secondary | ICD-10-CM | POA: Diagnosis not present

## 2015-10-11 DIAGNOSIS — H543 Unqualified visual loss, both eyes: Secondary | ICD-10-CM | POA: Diagnosis not present

## 2015-10-11 DIAGNOSIS — F411 Generalized anxiety disorder: Secondary | ICD-10-CM | POA: Diagnosis not present

## 2015-10-11 DIAGNOSIS — R69 Illness, unspecified: Secondary | ICD-10-CM | POA: Diagnosis not present

## 2015-10-11 DIAGNOSIS — E871 Hypo-osmolality and hyponatremia: Secondary | ICD-10-CM | POA: Diagnosis not present

## 2015-10-11 DIAGNOSIS — Z0000 Encounter for general adult medical examination without abnormal findings: Secondary | ICD-10-CM

## 2015-10-11 DIAGNOSIS — R739 Hyperglycemia, unspecified: Secondary | ICD-10-CM | POA: Diagnosis not present

## 2015-10-11 DIAGNOSIS — Z9359 Other cystostomy status: Secondary | ICD-10-CM | POA: Diagnosis not present

## 2015-10-11 DIAGNOSIS — Z23 Encounter for immunization: Secondary | ICD-10-CM

## 2015-10-11 DIAGNOSIS — J449 Chronic obstructive pulmonary disease, unspecified: Secondary | ICD-10-CM | POA: Diagnosis not present

## 2015-10-11 DIAGNOSIS — I10 Essential (primary) hypertension: Secondary | ICD-10-CM | POA: Diagnosis not present

## 2015-10-11 DIAGNOSIS — K219 Gastro-esophageal reflux disease without esophagitis: Secondary | ICD-10-CM | POA: Diagnosis not present

## 2015-10-11 LAB — COMPLETE METABOLIC PANEL WITH GFR
ALBUMIN: 4.2 g/dL (ref 3.6–5.1)
ALK PHOS: 75 U/L (ref 40–115)
ALT: 11 U/L (ref 9–46)
AST: 15 U/L (ref 10–35)
BILIRUBIN TOTAL: 0.5 mg/dL (ref 0.2–1.2)
BUN: 18 mg/dL (ref 7–25)
CO2: 25 mmol/L (ref 20–31)
CREATININE: 1.13 mg/dL (ref 0.70–1.18)
Calcium: 9.2 mg/dL (ref 8.6–10.3)
Chloride: 101 mmol/L (ref 98–110)
GFR, Est African American: 72 mL/min (ref >=60)
GFR, Est Non African American: 62 mL/min (ref >=60)
GLUCOSE: 92 mg/dL (ref 65–99)
Potassium: 4.5 mmol/L (ref 3.5–5.3)
SODIUM: 137 mmol/L (ref 135–146)
TOTAL PROTEIN: 6.7 g/dL (ref 6.1–8.1)

## 2015-10-11 LAB — LIPID PANEL
Cholesterol: 149 mg/dL (ref 125–200)
HDL: 53 mg/dL (ref >=40)
LDL CALC: 64 mg/dL (ref <130)
Total CHOL/HDL Ratio: 2.8 Ratio (ref <=5.0)
Triglycerides: 159 mg/dL — ABNORMAL HIGH (ref <150)
VLDL: 32 mg/dL — ABNORMAL HIGH (ref <30)

## 2015-10-11 NOTE — Progress Notes (Signed)
Patient ID: Ricardo [name], male   DOB: [birth year], 80 y.o.   MRN: [id number]   Location:  [facility]  Place of Service:  OFFICE  Provider: Arletha Grippe, DO  Patient Care Team: [name], DO as PCP - General (Internal Medicine) [name], MD as Consulting Physician (Urology)  Extended Emergency Contact Information Primary Emergency Contact: [name],Ricardo Address: [place],  [address] [name] Phone: [phone] Mobile Phone: [phone] Relation: Daughter  Code Status:  Goals of Care: Advanced Directive information Advanced Directives 10/11/2015  Does patient have an advance directive? Yes  Type of Advance Directive [place]  Does patient want to make changes to advanced directive? No - Patient declined  Copy of advanced directive(s) in chart? Yes  Would patient like information on creating an advanced directive? -     Chief Complaint  Patient presents with  . Annual Exam    Yearly Exam  . Other    request Flu Vacc    HPI: Patient is a 80 y.o. male seen in today for an annual wellness exam. He had a great 2 month vacation in [place].  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 His peripheral vision is worsening. He has optic nerve damage. He is HOH. 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 Followed by podiatry  He continues to have difficulty sleeping.   GERD sx's stable on protonix. Constipation stable on miralax. He takes probiotic daily.   Cholesterol stable on lipitor.   HTN - BP stable on losartan  Neurogenic bladder - foley cath is chronic. Takes trimethoprim for prevention. He has recurrent UTI hx and followed by Dr [name]. He had foley changed last week. No UTI in last 6 mos. He is taking cranberry po daily. underwent cystourethroscopy and suprapubic cath re-insertion by Dr [name] 3/20th.    GAD/insomnia - mood stable on celexa. Takes trazodone qhs to help sleep. He also takes  melatonin  COPD - stable on serevent  Depression screen [facility] 2/9 10/11/2015 05/11/2014  Decreased Interest 0 0  Down, Depressed, Hopeless 0 0  PHQ - 2 Score 0 0    Fall Risk  10/11/2015 07/10/2015 05/01/2015 03/29/2015 03/13/2015  Falls in the past year? Yes Yes No No No  Number falls in past yr: 2 or more 1 - - -  Injury with Fall? Yes No - - -  Risk for fall due to : - - - - -   MMSE - Mini Mental State Exam 10/11/2015 10/10/2014  Not completed: Unable to complete -  Orientation to time - 5  Orientation to Place - 5  Registration - 3  Attention/ Calculation - 5  Recall - 3  Language- name 2 objects - 2  Language- repeat - 1  Language- follow 3 step command - 3  Language- read & follow direction - 0  Write a sentence - 0  Copy design - 0  Total score - 27     Health Maintenance  Topic Date Due  . PNA vac Low Risk Adult (2 of 2 - PCV13) 02/02/2014  . INFLUENZA VACCINE  09/03/2015  . TETANUS/TDAP  02/03/2016 (Originally 12/30/1954)  . ZOSTAVAX  Completed    Urinary incontinence? No - he has suprapubic cath due to neurogenic bladder  Functional Status Survey: Is the patient deaf or have difficulty hearing?: Yes (reduced b/l; wears left coclear implant) Does the patient  have difficulty seeing, even when wearing glasses/contacts?: Yes (blind in OU) Does the patient have difficulty concentrating, remembering, or making decisions?: No Does the patient have difficulty walking or climbing stairs?: Yes Does the patient have difficulty dressing or bathing?: Yes Does the patient have difficulty doing errands alone such as visiting a doctor's office or shopping?: Yes (due to physical disability and blindness)  Exercise? Yes chair exercises and ambulates using walker several times per week. Walks 4 miles per week  Diet? Maintains healthy food choices and consumes 3 meals per day  Vision Screening Comments: Patient has eye doctor   Hearing: decreased; has cochlear implant    Dentition:  followed by dentist on a regular basis  Pain: overall controlled with prn aleve  Past Medical History:  Diagnosis Date  . Anxiety   . Arthritis    neck   . 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 Cancer [facility]) 1956   left testicle  . COPD (chronic obstructive pulmonary disease) ([place])   . 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 Eczema   . Enlarged prostate   . High cholesterol   . Hypertension   . Loss of balance    due to meningitis  . Meningitis   . Neurogenic bladder    History of  . Numbness    in feet due to meningitis   . Rotator cuff tear    right  . Skin cancer   . Stroke [name])    mini strokes in hospital with meningitis  . Unable to walk    due to meningitis    Past Surgical History:  Procedure Laterality Date  . APPENDECTOMY    . COCHLEAR IMPLANT    . CYSTOSCOPY WITH LITHOLAPAXY N/A 05/14/2014   Procedure: CYSTOSCOPY WITH LITHOLAPAXY;  Surgeon: [name], MD;  Location: [facility];  Service: Urology;  Laterality: N/A;  . CYSTOSCOPY WITH LITHOLAPAXY N/A 04/22/2015   Procedure: CYSTOSCOPY WITH LITHOLAPAXY;  Surgeon: [name], MD;  Location: [facility];  Service: Urology;  Laterality: N/A;  . INSERTION OF SUPRAPUBIC CATHETER N/A 05/14/2014   Procedure: INSERTION OF SUPRAPUBIC CATHETER;  Surgeon: [name], MD;  Location: [facility];  Service: Urology;  Laterality: N/A;  . INSERTION OF SUPRAPUBIC CATHETER N/A 04/22/2015   Procedure: INSERTION OF SUPRAPUBIC CATHETER;  Surgeon: [name], MD;  Location: [facility];  Service: Urology;  Laterality: N/A;  . PROSTATE SURGERY     biopsy x 2  . TESTICLE REMOVAL Left    October 1956  . TONSILLECTOMY     childhood    Family History  Problem Relation Age of Onset  . Stroke Mother   . Heart failure Father    Family Status  Relation Status  . Mother Deceased at age 15  . Father Deceased at age 64  . Daughter Alive  . Son Alive    Social History   Social History  . Marital status: Widowed    Spouse name: N/A   . Number of children: N/A  . Years of education: N/A   Occupational History  . Not on file.   Social History Main Topics  . Smoking status: Former Smoker    Years: 10.00    Types: Cigarettes    Start date: 02/10/1955    Quit date: 02/09/1965  . Smokeless tobacco: Never Used  . Alcohol use No  . Drug use: No  . Sexual activity: Not on file   Other Topics Concern  . Not on  file   Social History Narrative   Diet- Fairly balanced   Caffeine- Yes, Coffee   Married- 1964, [place] living now with daughter, all on the first floor   Pets-4, dog and Radiation protection practitioner, English as a second language teacher for [employer], Chief Financial Officer, Freight forwarder   Exercise- Yes, Rehab 3 times a week   Living will- Yes   DNR- Yes   POA/HPOA- Yes          Allergies  Allergen Reactions  . Peanut Oil Shortness Of Breath and Swelling    Itching  . Peanut-Containing Drug Products Shortness Of Breath and Swelling      Medication List       Accurate as of 10/11/15 10:54 AM. Always use your most recent med list.          Acidophilus 100 MG Caps Take 100 mg by mouth daily.   amLODipine 5 MG tablet Commonly known as:  NORVASC TAKE 1 TABLET (5 MG TOTAL) BY MOUTH DAILY.   aspirin 81 MG chewable tablet Take one tablet by mouth once daily   atorvastatin 10 MG tablet Commonly known as:  LIPITOR TAKE 1 TABLET (10 MG TOTAL) BY MOUTH AT BEDTIME.   citalopram 20 MG tablet Commonly known as:  CELEXA Take one tablet by mouth once daily for anxiety   ELLURA PO Take 2 tablets by mouth at bedtime.   losartan 25 MG tablet Commonly known as:  COZAAR Take one tablet by mouth once daily to control blood pressure   magnesium hydroxide 400 MG/5ML suspension Commonly known as:  MILK OF MAGNESIA Take 15 mLs by mouth daily as needed for mild constipation.   Melatonin 10 MG Tabs Take 10 mg by mouth at bedtime.   naproxen sodium 220 MG tablet Commonly known as:  ANAPROX Take 220 mg by mouth as  needed.   NEOMYCIN-POLYMYXIN-HC (OPHTH) Susp Take 15 mLs by mouth 2 (two) times a week.   pantoprazole 40 MG tablet Commonly known as:  PROTONIX Take one tablet by mouth once daily for stomach   salmeterol 50 MCG/DOSE diskus inhaler Commonly known as:  SEREVENT DISKUS TAKE 1 PUFF TWICE A DAY   traZODone 50 MG tablet Commonly known as:  DESYREL Take one tablet by mouth at bedtime   trimethoprim 100 MG tablet Commonly known as:  TRIMPEX Take 1 tablet (100 mg total) by mouth daily.        Review of Systems:  Review of Systems  Musculoskeletal: Positive for arthralgias, gait problem and joint swelling.  Skin: Positive for rash.  All other systems reviewed and are negative.   Physical Exam: Vitals:   10/11/15 0945  BP: 124/76  Pulse: 77  Temp: 97.7 F (36.5 C)  TempSrc: Oral  SpO2: 97%   There is no height or weight on file to calculate BMI. Physical Exam  Constitutional: He is oriented to person, place, and time. He appears well-developed and well-nourished. No distress.  Frail appearing in NAD, sitting in w/c  HENT:  Head: Normocephalic and atraumatic.  Right Ear: Hearing, tympanic membrane, external ear and ear canal normal.  Left Ear: Hearing, tympanic membrane, external ear and ear canal normal.  Mouth/Throat: Uvula is midline, oropharynx is clear and moist and mucous membranes are normal. He does not have dentures.  Cochlear implant intact  Eyes: Conjunctivae, EOM and lids are normal. Pupils are equal, round, and reactive to light. No scleral icterus.  Blind in OU  Neck: Trachea normal and normal range of  motion. Neck supple. Carotid bruit is not present. No thyroid mass and no thyromegaly present.  Cardiovascular: Normal rate, regular rhythm and intact distal pulses.  Exam reveals no gallop and no friction rub.   Murmur (1/6 SEM) heard. Trace LE edema b/l. No calf TTP  Pulmonary/Chest: Effort normal and breath sounds normal. He has no wheezes. He has no  rhonchi. He has no rales. He exhibits no tenderness. Right breast exhibits no inverted nipple, no mass, no nipple discharge, no skin change and no tenderness. Left breast exhibits no inverted nipple, no mass, no nipple discharge, no skin change and no tenderness. Breasts are symmetrical.  Abdominal: Soft. Normal appearance, normal aorta and bowel sounds are normal. He exhibits no distension, no abdominal bruit, no pulsatile midline mass and no mass. There is no hepatosplenomegaly. There is no tenderness. There is no rigidity, no rebound and no guarding. No hernia.  Genitourinary:  Genitourinary Comments: Suprapubic cath intact with foley DTG with sediment changes noted  Musculoskeletal: Normal range of motion. He exhibits edema and tenderness.  Large R>L foot bunion, NT. R>L calf muscle hypertrophy with ropy tissue texture changes and TTP  Lymphadenopathy:       Head (right side): No posterior auricular adenopathy present.       Head (left side): No posterior auricular adenopathy present.    He has no cervical adenopathy.       Right: No supraclavicular adenopathy present.       Left: No supraclavicular adenopathy present.  Neurological: He is alert and oriented to person, place, and time. He has normal strength. He displays atrophy. No cranial nerve deficit. He exhibits abnormal muscle tone (R>L calf muscle). Gait abnormal.  Reflex Scores:      Patellar reflexes are 3+ on the right side. Skin: Skin is warm, dry and intact. Rash (at suprapubic cath insertion site with redness, flaking. no d/c at present) noted. Nails show no clubbing.  Psychiatric: He has a normal mood and affect. His speech is normal and behavior is normal. Thought content normal. Cognition and memory are normal.    Labs reviewed:  Basic Metabolic Panel:  Recent Labs  03/20/15 0856  03/22/15 0442  03/29/15 1140 04/15/15 1030 07/10/15 1130  NA 122*  < > 122*  < > 134 138 139  K 4.2  < > 4.1  < > 4.1 4.4 4.4  CL 91*  <  > 91*  < > 94* 102 97  CO2 21*  < > 22  < > 19 26 21   GLUCOSE 100*  < > 108*  < > 90 107* 98  BUN 10  < > 15  < > 18 17 19   CREATININE 0.86  < > 0.77  < > 0.88 0.93 1.15  CALCIUM 8.7*  < > 8.7*  < > 8.8 9.1 9.4  MG  --   --  1.9  --   --   --   --   TSH 1.148  --   --   --   --   --  1.750  < > = values in this interval not displayed. Liver Function Tests:  Recent Labs  03/14/15 1632 03/21/15 0410 07/10/15 1130  AST 23 34  --   ALT 16* 66* 12  ALKPHOS 68 147*  --   BILITOT 0.8 0.9  --   PROT 6.4* 6.7  --   ALBUMIN 3.7 3.7  --    No results for input(s): LIPASE, AMYLASE in the last  8760 hours. No results for input(s): AMMONIA in the last 8760 hours. CBC:  Recent Labs  03/14/15 1632  03/17/15 0602 03/21/15 0410 03/29/15 1140 04/15/15 1030  WBC 9.2  < > 7.9 8.3 9.9 13.9*  NEUTROABS 7.6  --   --  4.2 6.3  --   HGB 10.9*  < > 11.2* 13.5  --  12.9*  HCT 31.1*  < > 31.9* 39.1 37.1* 40.0  MCV 86.1  < > 87.2 86.1 86 93.2  PLT 202  < > 208 334 396* 274  < > = values in this interval not displayed. Lipid Panel:  Recent Labs  07/10/15 1130  CHOL 159  HDL 60  LDLCALC 78  TRIG 105  CHOLHDL 2.7   Lab Results  Component Value Date   HGBA1C 6.0 (H) 04/22/2014    Procedures: No results found.  Assessment/Plan   ICD-9-CM ICD-10-CM   1. Well adult exam V70.0 Z00.00   2. Suprapubic catheter ([place]) V44.59 Z93.59   3. Essential hypertension 401.9 I10 CMP with eGFR  4. Chronic obstructive pulmonary disease, unspecified COPD, unspecified chronic bronchitis type 496 J44.9   5. Hyponatremia 276.1 E87.1   6. Generalized anxiety disorder 300.02 F41.1   7. Hyperlipidemia 272.4 E78.5 Lipid Panel  8. Impaired vision in both eyes 369.3 H54.3   9. Hyperglycemia 790.29 R73.9 CMP with eGFR     Hemoglobin A1c  10. Gastroesophageal reflux disease without esophagitis 530.81 K21.9     Pt is UTD on health maintenance. Vaccinations are UTD. Pt maintains a healthy lifestyle. Encouraged  pt to exercise 30-45 minutes 4-5 times per week. Eat a well balanced diet. Avoid smoking. Limit alcohol intake. Wear seatbelt when riding in the car. Wear sun block (SPF >50) when spending extended times outside.  T/c clotrimazole/betamethasone cream if rash does not improve  Continue current medications as ordered  Flu shot given today  Follow up with specialists as scheduled. Need to discuss with Dr [name] regarding PSA  Will call with lab results  Follow up in 3 mos for routine visit   Ricardo Grunder S. [name]  [facility] [address] [place], [place] [address] [phone] Cell (Monday-Friday 8 AM - 5 PM) [phone] After 5 PM and follow prompts

## 2015-10-11 NOTE — Patient Instructions (Addendum)
Encouraged him to exercise 30-45 minutes 4-5 times per week. Eat a well balanced diet. Avoid smoking. Limit alcohol intake. Wear seatbelt when riding in the car. Wear sun block (SPF >50) when spending extended times outside.  Continue current medications as ordered  Flu shot given today  Follow up with specialists as scheduled. Need to discuss with Dr [name] regarding PSA  Will call with lab results  Follow up in 3 mos for routine visit

## 2015-10-12 LAB — HEMOGLOBIN A1C
HEMOGLOBIN A1C: 5.9 % — AB (ref <5.7)
MEAN PLASMA GLUCOSE: 123 mg/dL

## 2015-10-18 DIAGNOSIS — N319 Neuromuscular dysfunction of bladder, unspecified: Secondary | ICD-10-CM | POA: Diagnosis not present

## 2015-10-18 DIAGNOSIS — R339 Retention of urine, unspecified: Secondary | ICD-10-CM | POA: Diagnosis not present

## 2015-10-18 DIAGNOSIS — N3020 Other chronic cystitis without hematuria: Secondary | ICD-10-CM | POA: Diagnosis not present

## 2015-10-22 DIAGNOSIS — L820 Inflamed seborrheic keratosis: Secondary | ICD-10-CM | POA: Diagnosis not present

## 2015-10-22 DIAGNOSIS — M6740 Ganglion, unspecified site: Secondary | ICD-10-CM | POA: Diagnosis not present

## 2015-10-23 ENCOUNTER — Encounter: Payer: Self-pay | Admitting: Internal Medicine

## 2015-10-23 ENCOUNTER — Ambulatory Visit (INDEPENDENT_AMBULATORY_CARE_PROVIDER_SITE_OTHER): Payer: Medicare HMO | Admitting: Internal Medicine

## 2015-10-23 VITALS — BP 118/68 | HR 80 | Temp 97.6°F

## 2015-10-23 DIAGNOSIS — H543 Unqualified visual loss, both eyes: Secondary | ICD-10-CM

## 2015-10-23 DIAGNOSIS — H109 Unspecified conjunctivitis: Secondary | ICD-10-CM | POA: Diagnosis not present

## 2015-10-23 MED ORDER — TOBRAMYCIN 0.3 % OP SOLN
1.0000 [drp] | Freq: Four times a day (QID) | OPHTHALMIC | 0 refills | Status: DC
Start: 2015-10-23 — End: 2015-11-02

## 2015-10-23 NOTE — Patient Instructions (Signed)
Use eye drops as ordered x 5 days  Continue other medications as ordered  Wash hands frequently especially when touching eyes  Follow up as scheduled

## 2015-10-23 NOTE — Progress Notes (Signed)
Patient ID: Ricardo [name], male   DOB: 10-12-1935, 80 y.o.   MRN: [id number]    Location:  [facility] Place of Service: OFFICE  Chief Complaint  Patient presents with  . Acute Visit    right eye, crusted in the morning, irritated, started about a week ago    HPI:  80 yo male seen today for OD d/c x 1 week. He awakens and right eye matted shut with d/c noted. Daughter has been placing OTC eye gtts in eye with no relief. She made appt when pt began to state the eye gtts were burning. 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 He has hx stroke and paraplegia. He recently returned from trip to [place]. He denies f/c. He is HOH.  Past Medical History:  Diagnosis Date  . Anxiety   . Arthritis    neck   . 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 Cancer [facility]) 1956   left testicle  . COPD (chronic obstructive pulmonary disease) ([place])   . 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 Eczema   . Enlarged prostate   . High cholesterol   . Hypertension   . Loss of balance    due to meningitis  . Meningitis   . Neurogenic bladder    History of  . Numbness    in feet due to meningitis   . Rotator cuff tear    right  . Skin cancer   . Stroke [facility])    mini strokes in hospital with meningitis  . Unable to walk    due to meningitis    Past Surgical History:  Procedure Laterality Date  . APPENDECTOMY    . COCHLEAR IMPLANT    . CYSTOSCOPY WITH LITHOLAPAXY N/A 05/14/2014   Procedure: CYSTOSCOPY WITH LITHOLAPAXY;  Surgeon: [name], MD;  Location: [facility];  Service: Urology;  Laterality: N/A;  . CYSTOSCOPY WITH LITHOLAPAXY N/A 04/22/2015   Procedure: CYSTOSCOPY WITH LITHOLAPAXY;  Surgeon: [name], MD;  [facility];  Service: Urology;  Laterality: N/A;  . INSERTION OF SUPRAPUBIC CATHETER N/A 05/14/2014   Procedure: INSERTION OF SUPRAPUBIC CATHETER;  Surgeon: [name], MD;  [facility];  Service: Urology;  Laterality: N/A;  . INSERTION OF SUPRAPUBIC CATHETER N/A  04/22/2015   Procedure: INSERTION OF SUPRAPUBIC CATHETER;  Surgeon: [name], MD;  [facility];  Service: Urology;  Laterality: N/A;  . PROSTATE SURGERY     biopsy x 2  . TESTICLE REMOVAL Left    October 1956  . TONSILLECTOMY     childhood    Patient Care Team: [name], DO as PCP - General (Internal Medicine) [name], MD as Consulting Physician (Urology)  Social History   Social History  . Marital status: Widowed    Spouse name: N/A  . Number of children: N/A  . Years of education: N/A   Occupational History  . Not on file.   Social History Main Topics  . Smoking status: Former Smoker    Years: 10.00    Types: Cigarettes    Start date: 02/10/1955    Quit date: 02/09/1965  . Smokeless tobacco: Never Used  . Alcohol use No  . Drug use: No  . Sexual activity: Not on file   Other Topics Concern  . Not on file   Social History Narrative   Diet- Fairly balanced   Caffeine- Yes, Coffee   Married- 1964, Widowed  House- Assisted living now with daughter, all on the first floor   Pets-4, dog and cat   Current/past profession-Teacher, English as a second language teacher for [employer], Chief Financial Officer, Freight forwarder   Exercise- Yes, Rehab 3 times a week   Living will- Yes   DNR- Yes   POA/HPOA- Yes           reports that he quit smoking about 50 years ago. His smoking use included Cigarettes. He started smoking about 60 years ago. He quit after 10.00 years of use. He has never used smokeless tobacco. He reports that he does not drink alcohol or use drugs.  Family History  Problem Relation Age of Onset  . Stroke Mother   . Heart failure Father    Family Status  Relation Status  . Mother Deceased at age 9  . Father Deceased at age 23  . Daughter Alive  . Son Alive     Allergies  Allergen Reactions  . Peanut Oil Shortness Of Breath and Swelling    Itching  . Peanut-Containing Drug Products Shortness Of Breath and Swelling    Medications: Patient's Medications  New  Prescriptions   TOBRAMYCIN (TOBREX) 0.3 % OPHTHALMIC SOLUTION    Place 1 drop into the right eye 4 (four) times daily. Use for 5 days  Previous Medications   AMLODIPINE (NORVASC) 5 MG TABLET    TAKE 1 TABLET (5 MG TOTAL) BY MOUTH DAILY.   ASPIRIN 81 MG CHEWABLE TABLET    Take one tablet by mouth once daily   ATORVASTATIN (LIPITOR) 10 MG TABLET    TAKE 1 TABLET (10 MG TOTAL) BY MOUTH AT BEDTIME.   CITALOPRAM (CELEXA) 20 MG TABLET    Take one tablet by mouth once daily for anxiety   CRANBERRY (ELLURA PO)    Take 2 tablets by mouth at bedtime.   LACTOBACILLUS (ACIDOPHILUS) 100 MG CAPS    Take 100 mg by mouth daily.   LOSARTAN (COZAAR) 25 MG TABLET    Take one tablet by mouth once daily to control blood pressure   MAGNESIUM HYDROXIDE (MILK OF MAGNESIA) 400 MG/5ML SUSPENSION    Take 15 mLs by mouth daily as needed for mild constipation.   MELATONIN 10 MG TABS    Take 10 mg by mouth at bedtime.    NAPROXEN SODIUM (ANAPROX) 220 MG TABLET    Take 220 mg by mouth as needed.   NEOMYCIN-POLYMYXIN-HC, OPHTH, SUSP    Take 15 mLs by mouth 2 (two) times a week.   PANTOPRAZOLE (PROTONIX) 40 MG TABLET    Take one tablet by mouth once daily for stomach   SALMETEROL (SEREVENT DISKUS) 50 MCG/DOSE DISKUS INHALER    TAKE 1 PUFF TWICE A DAY   TRAZODONE (DESYREL) 50 MG TABLET    Take one tablet by mouth at bedtime  Modified Medications   No medications on file  Discontinued Medications   TRIMETHOPRIM (TRIMPEX) 100 MG TABLET    Take 1 tablet (100 mg total) by mouth daily.    Review of Systems  HENT: Positive for hearing loss.   Eyes: Positive for pain, discharge, redness, itching and visual disturbance.  Genitourinary: Positive for difficulty urinating.  Musculoskeletal: Positive for arthralgias and gait problem.  Neurological: Positive for weakness.  All other systems reviewed and are negative.   Vitals:   10/23/15 1123  BP: 118/68  Pulse: 80  Temp: 97.6 F (36.4 C)  TempSrc: Oral  SpO2: 97%   There  is no height or weight on file to calculate  BMI.  Physical Exam  Constitutional: He is oriented to person, place, and time. He appears well-developed and well-nourished.  HENT:  Head: Normocephalic and atraumatic.  Mouth/Throat: No oropharyngeal exudate.  Eyes: Pupils are equal, round, and reactive to light. Right eye exhibits discharge (cream colored). Right eye exhibits no chemosis, no exudate and no hordeolum. No foreign body present in the right eye. Left eye exhibits no discharge. Right conjunctiva is injected. Right conjunctiva has no hemorrhage. Left conjunctiva is not injected. Left conjunctiva has no hemorrhage. No scleral icterus.  Right conjunctiva cobblestoning with increased redness. No eyelash involvement. No corneal redness  Neurological: He is alert and oriented to person, place, and time.  Skin: Skin is warm and dry. No rash noted.  Psychiatric: He has a normal mood and affect. His behavior is normal. Judgment and thought content normal.     Labs reviewed: Office Visit on 10/11/2015  Component Date Value Ref Range Status  . Sodium 10/11/2015 137  135 - 146 mmol/L Final  . Potassium 10/11/2015 4.5  3.5 - 5.3 mmol/L Final  . Chloride 10/11/2015 101  98 - 110 mmol/L Final  . CO2 10/11/2015 25  20 - 31 mmol/L Final  . Glucose, Bld 10/11/2015 92  65 - 99 mg/dL Final  . BUN 10/11/2015 18  7 - 25 mg/dL Final  . Creat 10/11/2015 1.13  0.70 - 1.18 mg/dL Final   Comment:   For patients > or = 78 years of age: The upper reference limit for Creatinine is approximately 13% higher for people identified as African-American.     . Total Bilirubin 10/11/2015 0.5  0.2 - 1.2 mg/dL Final  . Alkaline Phosphatase 10/11/2015 75  40 - 115 U/L Final  . AST 10/11/2015 15  10 - 35 U/L Final  . ALT 10/11/2015 11  9 - 46 U/L Final  . Total Protein 10/11/2015 6.7  6.1 - 8.1 g/dL Final  . Albumin 10/11/2015 4.2  3.6 - 5.1 g/dL Final  . Calcium 10/11/2015 9.2  8.6 - 10.3 mg/dL Final  . GFR,  Est African American 10/11/2015 72  >=60 mL/min Final  . GFR, Est Non African American 10/11/2015 62  >=60 mL/min Final  . Cholesterol 10/11/2015 149  125 - 200 mg/dL Final  . Triglycerides 10/11/2015 159* <150 mg/dL Final  . HDL 10/11/2015 53  >=40 mg/dL Final  . Total CHOL/HDL Ratio 10/11/2015 2.8  <=5.0 Ratio Final  . VLDL 10/11/2015 32* <30 mg/dL Final  . LDL Cholesterol 10/11/2015 64  <130 mg/dL Final   Comment:   Total Cholesterol/HDL Ratio:CHD Risk                        Coronary Heart Disease Risk Table                                        Men       Women          1/2 Average Risk              3.4        3.3              Average Risk              5.0        4.4  2X Average Risk              9.6        7.1           3X Average Risk             23.4       11.0 Use the calculated Patient Ratio above and the CHD Risk table  to determine the patient's CHD Risk.   . Hgb A1c MFr Bld 10/12/2015 5.9* <5.7 % Final   Comment:   For someone without known diabetes, a hemoglobin A1c value between 5.7% and 6.4% is consistent with prediabetes and should be confirmed with a follow-up test.   For someone with known diabetes, a value <7% indicates that their diabetes is well controlled. A1c targets should be individualized based on duration of diabetes, age, co-morbid conditions and other considerations.   This assay result is consistent with an increased risk of diabetes.   Currently, no consensus exists regarding use of hemoglobin A1c for diagnosis of diabetes in children.     . Mean Plasma Glucose 10/12/2015 123  mg/dL Final    No results found.   Assessment/Plan   ICD-9-CM ICD-10-CM   1. Conjunctivitis of right eye 372.30 H10.9 tobramycin (TOBREX) 0.3 % ophthalmic solution  2. Impaired vision in both eyes 369.3 H54.3    Use eye drops as ordered (1 gtt into OD QID) x 5 days  Continue other medications as ordered  Wash hands frequently especially when touching  eyes  Follow up as scheduled  Firmin Belisle S. [name]  [facility] [address] [place], [place] [address] [phone] Cell (Monday-Friday 8 AM - 5 PM) [phone] After 5 PM and follow prompts

## 2015-10-26 ENCOUNTER — Encounter (HOSPITAL_COMMUNITY): Payer: Self-pay | Admitting: Emergency Medicine

## 2015-10-26 ENCOUNTER — Emergency Department (HOSPITAL_COMMUNITY)
Admission: EM | Admit: 2015-10-26 | Discharge: 2015-10-26 | Disposition: A | Discharge status: Home / Self Care | Payer: Medicare HMO | Attending: Emergency Medicine | Admitting: Emergency Medicine

## 2015-10-26 DIAGNOSIS — Z791 Long term (current) use of non-steroidal anti-inflammatories (NSAID): Secondary | ICD-10-CM | POA: Insufficient documentation

## 2015-10-26 DIAGNOSIS — T83098A Other mechanical complication of other indwelling urethral catheter, initial encounter: Secondary | ICD-10-CM | POA: Insufficient documentation

## 2015-10-26 DIAGNOSIS — Z85828 Personal history of other malignant neoplasm of skin: Secondary | ICD-10-CM | POA: Diagnosis not present

## 2015-10-26 DIAGNOSIS — T839XXA Unspecified complication of genitourinary prosthetic device, implant and graft, initial encounter: Secondary | ICD-10-CM

## 2015-10-26 DIAGNOSIS — Z79899 Other long term (current) drug therapy: Secondary | ICD-10-CM | POA: Insufficient documentation

## 2015-10-26 DIAGNOSIS — Z8547 Personal history of malignant neoplasm of testis: Secondary | ICD-10-CM | POA: Insufficient documentation

## 2015-10-26 DIAGNOSIS — Y732 Prosthetic and other implants, materials and accessory gastroenterology and urology devices associated with adverse incidents: Secondary | ICD-10-CM | POA: Diagnosis not present

## 2015-10-26 DIAGNOSIS — Z7982 Long term (current) use of aspirin: Secondary | ICD-10-CM | POA: Diagnosis not present

## 2015-10-26 DIAGNOSIS — Z87891 Personal history of nicotine dependence: Secondary | ICD-10-CM | POA: Insufficient documentation

## 2015-10-26 DIAGNOSIS — J449 Chronic obstructive pulmonary disease, unspecified: Secondary | ICD-10-CM | POA: Diagnosis not present

## 2015-10-26 DIAGNOSIS — I10 Essential (primary) hypertension: Secondary | ICD-10-CM | POA: Insufficient documentation

## 2015-10-26 LAB — URINE MICROSCOPIC-ADD ON

## 2015-10-26 LAB — URINALYSIS, ROUTINE W REFLEX MICROSCOPIC
Bilirubin Urine: NEGATIVE
Glucose, UA: NEGATIVE mg/dL
Ketones, ur: NEGATIVE mg/dL
Nitrite: POSITIVE — AB
Protein, ur: NEGATIVE mg/dL
Specific Gravity, Urine: 1.014 (ref 1.005–1.030)
pH: 7 (ref 5.0–8.0)

## 2015-10-26 NOTE — ED Provider Notes (Signed)
[facility] DEPT Provider Note   CSN: [id number] Arrival date & time: 10/26/15  [id number]     History   Chief Complaint Chief Complaint  Patient presents with  . Urinary Retention    r/t clog in urinary catheter    HPI Ricardo [name] is a 79 y.o. male.  HPI   80 year old male with urinary retention. He has a chronic suprapubic catheter. His daughters at bedside and is very involved with this catheter care. She tried flushing it this morning it was difficult. She was unable to get anything in return. She has noticed a lot of sediment in his urine over the last several days. Patient himself has no acute complaints. He denies any significant pain. Typical symptoms for him of urinary tract infection is primarily mental status changes. He is currently very lucid. Denies any fevers.  Past Medical History:  Diagnosis Date  . Anxiety   . Arthritis    neck   . 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 Cancer [facility] 1956   left testicle  . COPD (chronic obstructive pulmonary disease) ([place])   . 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 Eczema   . Enlarged prostate   . High cholesterol   . Hypertension   . Loss of balance    due to meningitis  . Meningitis   . Neurogenic bladder    History of  . Numbness    in feet due to meningitis   . Rotator cuff tear    right  . Skin cancer   . Stroke [facility])    mini strokes in hospital with meningitis  . Unable to walk    due to meningitis    Patient Active Problem List   Diagnosis Date Noted  . Mood disorder ([place]) 03/28/2015  . Insomnia 03/28/2015  . Anemia of chronic disease, IDA 03/15/2015  . Gait disturbance 03/15/2015  . Nonspecific abnormal electrocardiogram (ECG) (EKG) 03/15/2015  . Urinary tract infection due to Proteus, suprapubic cath  03/15/2015  . Acute kidney injury ([place]) 03/15/2015  . Acute encephalopathy 03/14/2015  . Impaired vision in both eyes 10/11/2014  . COPD (chronic obstructive pulmonary disease) ([place]) 04/21/2014  . Hyponatremia  04/21/2014  . Essential hypertension 02/09/2014  . BPH (benign prostatic hypertrophy) 02/09/2014    Past Surgical History:  Procedure Laterality Date  . APPENDECTOMY    . COCHLEAR IMPLANT    . CYSTOSCOPY WITH LITHOLAPAXY N/A 05/14/2014   Procedure: CYSTOSCOPY WITH LITHOLAPAXY;  Surgeon: [name], MD;  [facility];  Service: Urology;  Laterality: N/A;  . CYSTOSCOPY WITH LITHOLAPAXY N/A 04/22/2015   Procedure: CYSTOSCOPY WITH LITHOLAPAXY;  Surgeon: [name], MD;  [facility];  Service: Urology;  Laterality: N/A;  . INSERTION OF SUPRAPUBIC CATHETER N/A 05/14/2014   Procedure: INSERTION OF SUPRAPUBIC CATHETER;  Surgeon: [name], MD;  [facility];  Service: Urology;  Laterality: N/A;  . INSERTION OF SUPRAPUBIC CATHETER N/A 04/22/2015   Procedure: INSERTION OF SUPRAPUBIC CATHETER;  Surgeon: [name], MD;  [facility];  Service: Urology;  Laterality: N/A;  . PROSTATE SURGERY     biopsy x 2  . TESTICLE REMOVAL Left    October 1956  . TONSILLECTOMY     childhood       Home Medications    Prior to Admission medications   Medication Sig Start Date End Date Taking? Authorizing Provider  amLODipine (NORVASC) 5 MG tablet TAKE 1 TABLET (5 MG TOTAL) BY MOUTH DAILY.  Patient taking differently: TAKE 1 TABLET (5 MG TOTAL) BY MOUTH EVERY NIGHT 09/23/15  Yes [name], DO  aspirin 81 MG chewable tablet Take one tablet by mouth once daily Patient taking differently: Chew 81 mg by mouth every morning.  02/21/14  Yes Monica Eulas Post, DO  atorvastatin (LIPITOR) 10 MG tablet TAKE 1 TABLET (10 MG TOTAL) BY MOUTH AT BEDTIME. 06/04/15  Yes [name], DO  citalopram (CELEXA) 20 MG tablet Take one tablet by mouth once daily for anxiety Patient taking differently: Take 20 mg by mouth at bedtime.  09/23/15  Yes Monica Carter, DO  Cranberry (ELLURA PO) Take 2 tablets by mouth at bedtime.   Yes Historical Provider, MD  Lactobacillus (ACIDOPHILUS) 100 MG  CAPS Take 100 mg by mouth every morning.    Yes Historical Provider, MD  losartan (COZAAR) 25 MG tablet Take one tablet by mouth once daily to control blood pressure Patient taking differently: Take 25 mg by mouth every morning.  08/27/15  Yes [name], DO  magnesium hydroxide (MILK OF MAGNESIA) 400 MG/5ML suspension Take 15 mLs by mouth daily as needed for mild constipation.   Yes Historical Provider, MD  Melatonin 10 MG TABS Take 10 mg by mouth at bedtime.    Yes Historical Provider, MD  naproxen sodium (ANAPROX) 220 MG tablet Take 220-440 mg by mouth every 12 (twelve) hours as needed (pain).    Yes Historical Provider, MD  pantoprazole (PROTONIX) 40 MG tablet Take one tablet by mouth once daily for stomach Patient taking differently: Take 40 mg by mouth every morning.  06/04/15  Yes Monica Carter, DO  salmeterol (SEREVENT DISKUS) 50 MCG/DOSE diskus inhaler TAKE 1 PUFF TWICE A DAY 08/27/15  Yes [name], DO  tobramycin (TOBREX) 0.3 % ophthalmic solution Place 1 drop into the right eye 4 (four) times daily. Use for 5 days 10/23/15  Yes [name], DO  traZODone (DESYREL) 50 MG tablet Take one tablet by mouth at bedtime Patient taking differently: Take 50 mg by mouth at bedtime.  09/23/15  Yes [name], DO    Family History Family History  Problem Relation Age of Onset  . Stroke Mother   . Heart failure Father     Social History Social History  Substance Use Topics  . Smoking status: Former Smoker    Years: 10.00    Types: Cigarettes    Start date: 02/10/1955    Quit date: 02/09/1965  . Smokeless tobacco: Never Used  . Alcohol use No     Allergies   Peanut oil and Peanut-containing drug products   Review of Systems Review of Systems   All systems reviewed and negative, other than as noted in HPI.    Physical Exam Updated Vital Signs BP 123/58 (BP Location: Right Arm)   Pulse 79   Temp 97.5 F (36.4 C) (Oral)   Resp 17   Ht 6\' 1"  (1.854 m)   Wt 185 lb (83.9  kg)   SpO2 97%   BMI 24.41 kg/m   Physical Exam  Constitutional: He appears well-developed and well-nourished. No distress.  HENT:  Head: Normocephalic and atraumatic.  Eyes: Conjunctivae are normal. Right eye exhibits no discharge. Left eye exhibits no discharge.  Neck: Neck supple.  Cardiovascular: Normal rate, regular rhythm and normal heart sounds.  Exam reveals no gallop and no friction rub.   No murmur heard. Pulmonary/Chest: Effort normal and breath sounds normal. No respiratory distress.  Abdominal: Soft. He exhibits no distension. There is  no tenderness.  24F suprapubic catheter. No urine in tubing bag. No suprapubic tenderness/distension.   Musculoskeletal: He exhibits no edema or tenderness.  Neurological: He is alert.  Hard of hearing.   Skin: Skin is warm and dry.  Psychiatric: He has a normal mood and affect. His behavior is normal. Thought content normal.  Nursing note and vitals reviewed.    ED Treatments / Results  Labs (all labs ordered are listed, but only abnormal results are displayed) Labs Reviewed  URINE CULTURE - Abnormal; Notable for the following:       Result Value   Culture >=100,000 COLONIES/mL PROTEUS MIRABILIS (*)    All other components within normal limits  URINALYSIS, ROUTINE W REFLEX MICROSCOPIC (NOT AT [facility]) - Abnormal; Notable for the following:    APPearance CLOUDY (*)    Hgb urine dipstick LARGE (*)    Nitrite POSITIVE (*)    Leukocytes, UA LARGE (*)    All other components within normal limits  URINE MICROSCOPIC-ADD ON - Abnormal; Notable for the following:    Squamous Epithelial / LPF 0-5 (*)    Bacteria, UA MANY (*)    All other components within normal limits    EKG  EKG Interpretation None       Radiology No results found.  Procedures BLADDER CATHETERIZATION Date/Time: 10/26/2015 11:00 AM Performed by: Virgel Manifold Authorized by: Virgel Manifold   Consent:    Consent obtained:  Verbal   Consent given by:   Patient Pre-procedure details:    Procedure purpose:  Therapeutic (suprapubic catheter w/o drainage. existing 24F catheter exchanged for new one. )   Preparation: Patient was prepped and draped in usual sterile fashion   Anesthesia (see MAR for exact dosages):    Anesthesia method:  None Procedure details:    Catheter insertion:  Indwelling (Suprapubic)   Catheter type:  Foley   Catheter size:  20 Fr   Bladder irrigation: no     Number of attempts:  1   Urine characteristics:  Mildly cloudy Comments:     Minimal bleeding with catheter exchange. Stopped spontaneously.    (including critical care time)  Medications Ordered in ED Medications - No data to display   Initial Impression / Assessment and Plan / ED Course  I have reviewed the triage vital signs and the nursing notes.  Pertinent labs & imaging results that were available during my care of the patient were reviewed by me and considered in my medical decision making (see chart for details).  Clinical Course   80 year old male with acute urinary retention. His suprapubic catheter was exchanged and now appears to be functioning properly. Will send urinalysis/culture. Without obvious symptoms will defer from treatment.  Final Clinical Impressions(s) / ED Diagnoses   Final diagnoses:  Foley catheter problem, initial encounter [facility])    New Prescriptions New Prescriptions   No medications on file      Virgel Manifold, MD 10/27/15 1949

## 2015-10-26 NOTE — ED Triage Notes (Signed)
Pt presents with daughter. Per daughter, pt is blind and legally deaf and has no balance d/t meningitis. Daughter states that she was doing a routine bladder irrigation this morning and that the solution was not easily going in. Pt has a history of clogs in his catheter but has not had one in 7 months.  Pt denies pain.

## 2015-10-28 LAB — URINE CULTURE: Culture: >=100000 — AB

## 2015-10-29 ENCOUNTER — Telehealth (HOSPITAL_BASED_OUTPATIENT_CLINIC_OR_DEPARTMENT_OTHER): Payer: Self-pay | Admitting: Emergency Medicine

## 2015-10-29 NOTE — Telephone Encounter (Signed)
Post ED Visit - Positive Culture Follow-up  Culture report reviewed by antimicrobial stewardship pharmacist:  []  Elenor Quinones, Pharm.D. []  Heide Guile, Pharm.D., BCPS []  Parks Neptune, Pharm.D. []  Alycia Rossetti, Pharm.D., BCPS []  Rison, Pharm.D., BCPS, AAHIVP []  Legrand Como, Pharm.D., BCPS, AAHIVP []  Milus Glazier, Pharm.D. []  Stephens November, Pharm.Amedeo Gory PharmD  Positive urine culture Treated with none, asymptomatic,no further patient follow-up is required at this time.  [name] 10/29/2015, 10:41 AM

## 2015-10-31 ENCOUNTER — Other Ambulatory Visit: Payer: Self-pay | Admitting: *Deleted

## 2015-10-31 MED ORDER — TRAZODONE HCL 50 MG PO TABS: ORAL_TABLET | ORAL | 0 refills | Status: DC

## 2015-10-31 NOTE — Telephone Encounter (Signed)
[facility]

## 2015-11-02 ENCOUNTER — Encounter (HOSPITAL_COMMUNITY): Payer: Self-pay

## 2015-11-02 ENCOUNTER — Emergency Department (HOSPITAL_COMMUNITY)
Admission: EM | Admit: 2015-11-02 | Discharge: 2015-11-02 | Disposition: A | Discharge status: Home / Self Care | Payer: Medicare HMO | Attending: Emergency Medicine | Admitting: Emergency Medicine

## 2015-11-02 DIAGNOSIS — Z91010 Allergy to peanuts: Secondary | ICD-10-CM | POA: Insufficient documentation

## 2015-11-02 DIAGNOSIS — Z87891 Personal history of nicotine dependence: Secondary | ICD-10-CM | POA: Insufficient documentation

## 2015-11-02 DIAGNOSIS — Y828 Other medical devices associated with adverse incidents: Secondary | ICD-10-CM | POA: Diagnosis not present

## 2015-11-02 DIAGNOSIS — T83198A Other mechanical complication of other urinary devices and implants, initial encounter: Secondary | ICD-10-CM | POA: Insufficient documentation

## 2015-11-02 DIAGNOSIS — Z8673 Personal history of transient ischemic attack (TIA), and cerebral infarction without residual deficits: Secondary | ICD-10-CM | POA: Diagnosis not present

## 2015-11-02 DIAGNOSIS — N390 Urinary tract infection, site not specified: Secondary | ICD-10-CM

## 2015-11-02 DIAGNOSIS — T83090A Other mechanical complication of cystostomy catheter, initial encounter: Secondary | ICD-10-CM

## 2015-11-02 DIAGNOSIS — I10 Essential (primary) hypertension: Secondary | ICD-10-CM | POA: Diagnosis not present

## 2015-11-02 DIAGNOSIS — Z7982 Long term (current) use of aspirin: Secondary | ICD-10-CM | POA: Insufficient documentation

## 2015-11-02 DIAGNOSIS — Z85828 Personal history of other malignant neoplasm of skin: Secondary | ICD-10-CM | POA: Insufficient documentation

## 2015-11-02 DIAGNOSIS — Z79899 Other long term (current) drug therapy: Secondary | ICD-10-CM | POA: Diagnosis not present

## 2015-11-02 DIAGNOSIS — Z8547 Personal history of malignant neoplasm of testis: Secondary | ICD-10-CM | POA: Insufficient documentation

## 2015-11-02 DIAGNOSIS — J449 Chronic obstructive pulmonary disease, unspecified: Secondary | ICD-10-CM | POA: Insufficient documentation

## 2015-11-02 DIAGNOSIS — R339 Retention of urine, unspecified: Secondary | ICD-10-CM | POA: Insufficient documentation

## 2015-11-02 MED ORDER — CEFTRIAXONE SODIUM 1 G IJ SOLR
1.0000 g | Freq: Once | INTRAMUSCULAR | Status: AC
  Administered 2015-11-02: 1 g via INTRAMUSCULAR
  Filled 2015-11-02: qty 10

## 2015-11-02 MED ORDER — CEPHALEXIN 500 MG PO CAPS: 500.0000 mg | ORAL_CAPSULE | Freq: Three times a day (TID) | ORAL | 0 refills | Status: DC

## 2015-11-02 MED ORDER — LIDOCAINE HCL 1 % IJ SOLN
INTRAMUSCULAR | Status: AC
  Administered 2015-11-02: 20 mL
  Filled 2015-11-02: qty 20

## 2015-11-02 NOTE — ED Provider Notes (Signed)
[facility] DEPT Provider Note   CSN: [id number] Arrival date & time: 11/02/15  [id number]     History   Chief Complaint Chief Complaint  Patient presents with  . Urinary Retention    HPI Ricardo [name] is a 80 y.o. male.  HPI Patient presents to the emergency department with complaints of an obstructed suprapubic catheter.  Patient recently had his suprapubic catheter changed out and per his daughter he does have a Proteus infection noted on his electronic medical record my chart.  He has not been placed on antibiotics at this time.  The daughter tried to contact the primary care physician about initiation of antibiotics but none have been started at this time.  Review of electronic medical record does demonstrate greater than 100,000 Proteus infection on his urine culture.  She states she's been a little bit more confused over the past 24 hours but no documented fever at home.  His been eating and drinking normally.  No vomiting.  No complaints of abdominal pain.  Family is requesting exchange of his suprapubic catheter and initiation of antibiotics.  Patient lives at home with his daughter who is medically educated    Past Medical History:  Diagnosis Date  . Anxiety   . Arthritis    neck   . 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 Cancer [facility]) 1956   left testicle  . COPD (chronic obstructive pulmonary disease) ([place])   . 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 Eczema   . Enlarged prostate   . High cholesterol   . Hypertension   . Loss of balance    due to meningitis  . Meningitis   . Neurogenic bladder    History of  . Numbness    in feet due to meningitis   . Rotator cuff tear    right  . Skin cancer   . Stroke [facility])    mini strokes in hospital with meningitis  . Unable to walk    due to meningitis    Patient Active Problem List   Diagnosis Date Noted  . Mood disorder ([place]) 03/28/2015  . Insomnia 03/28/2015  . Anemia of chronic disease, IDA 03/15/2015  . Gait disturbance  03/15/2015  . Nonspecific abnormal electrocardiogram (ECG) (EKG) 03/15/2015  . Urinary tract infection due to Proteus, suprapubic cath  03/15/2015  . Acute kidney injury ([place]) 03/15/2015  . Acute encephalopathy 03/14/2015  . Impaired vision in both eyes 10/11/2014  . COPD (chronic obstructive pulmonary disease) ([place]) 04/21/2014  . Hyponatremia 04/21/2014  . Essential hypertension 02/09/2014  . BPH (benign prostatic hypertrophy) 02/09/2014    Past Surgical History:  Procedure Laterality Date  . APPENDECTOMY    . COCHLEAR IMPLANT    . CYSTOSCOPY WITH LITHOLAPAXY N/A 05/14/2014   Procedure: CYSTOSCOPY WITH LITHOLAPAXY;  Surgeon: [name], MD;  Location: [facility];  Service: Urology;  Laterality: N/A;  . CYSTOSCOPY WITH LITHOLAPAXY N/A 04/22/2015   Procedure: CYSTOSCOPY WITH LITHOLAPAXY;  Surgeon: [name], MD;  Location: [facility];  Service: Urology;  Laterality: N/A;  . INSERTION OF SUPRAPUBIC CATHETER N/A 05/14/2014   Procedure: INSERTION OF SUPRAPUBIC CATHETER;  Surgeon: [name], MD;  Location: [facility];  Service: Urology;  Laterality: N/A;  . INSERTION OF SUPRAPUBIC CATHETER N/A 04/22/2015   Procedure: INSERTION OF SUPRAPUBIC CATHETER;  Surgeon: [name], MD;  Location[facility];  Service: Urology;  Laterality: N/A;  . PROSTATE SURGERY     biopsy x 2  .  TESTICLE REMOVAL Left    October 1956  . TONSILLECTOMY     childhood       Home Medications    Prior to Admission medications   Medication Sig Start Date End Date Taking? Authorizing Provider  amLODipine (NORVASC) 5 MG tablet TAKE 1 TABLET (5 MG TOTAL) BY MOUTH DAILY. Patient taking differently: TAKE 1 TABLET (5 MG TOTAL) BY MOUTH EVERY NIGHT 09/23/15  Yes [name], DO  aspirin 81 MG chewable tablet Take one tablet by mouth once daily Patient taking differently: Chew 81 mg by mouth daily with breakfast.  02/21/14  Yes [name], DO  atorvastatin (LIPITOR) 10 MG tablet TAKE 1 TABLET (10 MG  TOTAL) BY MOUTH AT BEDTIME. 06/04/15  Yes [name], DO  citalopram (CELEXA) 20 MG tablet Take one tablet by mouth once daily for anxiety Patient taking differently: Take 20 mg by mouth at bedtime.  09/23/15  Yes Monica Carter, DO  Cranberry (ELLURA PO) Take 2 tablets by mouth at bedtime.   Yes Historical Provider, MD  Lactobacillus (ACIDOPHILUS) 100 MG CAPS Take 100 mg by mouth every morning.    Yes Historical Provider, MD  losartan (COZAAR) 25 MG tablet Take one tablet by mouth once daily to control blood pressure Patient taking differently: Take 25 mg by mouth daily with breakfast.  08/27/15  Yes [name], DO  magnesium hydroxide (MILK OF MAGNESIA) 400 MG/5ML suspension Take 15 mLs by mouth daily as needed for mild constipation.   Yes Historical Provider, MD  Melatonin 10 MG TABS Take 10 mg by mouth at bedtime.    Yes Historical Provider, MD  naproxen sodium (ANAPROX) 220 MG tablet Take 220-440 mg by mouth every 12 (twelve) hours as needed (pain).    Yes Historical Provider, MD  pantoprazole (PROTONIX) 40 MG tablet Take one tablet by mouth once daily for stomach Patient taking differently: Take 40 mg by mouth daily with breakfast.  06/04/15  Yes [name], DO  salmeterol (SEREVENT DISKUS) 50 MCG/DOSE diskus inhaler TAKE 1 PUFF TWICE A DAY 08/27/15  Yes [name], DO  traZODone (DESYREL) 50 MG tablet Take one tablet by mouth at bedtime Patient taking differently: Take 50 mg by mouth at bedtime.  10/31/15  Yes Lauree Chandler, NP  triamcinolone cream (KENALOG) 0.1 % Apply 1 application topically daily.   Yes Historical Provider, MD  cephALEXin (KEFLEX) 500 MG capsule Take 1 capsule (500 mg total) by mouth 3 (three) times daily. 11/02/15   Jola Schmidt, MD    Family History Family History  Problem Relation Age of Onset  . Stroke Mother   . Heart failure Father     Social History Social History  Substance Use Topics  . Smoking status: Former Smoker    Years: 10.00    Types:  Cigarettes    Start date: 02/10/1955    Quit date: 02/09/1965  . Smokeless tobacco: Never Used  . Alcohol use No     Allergies   Peanut oil and Peanut-containing drug products   Review of Systems Review of Systems  All other systems reviewed and are negative.    Physical Exam Updated Vital Signs BP 162/67 (BP Location: Left Arm)   Pulse 77   Temp 98.4 F (36.9 C) (Oral)   Resp 20   Ht 6\' 1"  (1.854 m)   Wt 185 lb (83.9 kg)   SpO2 98%   BMI 24.41 kg/m   Physical Exam  Constitutional: He is oriented to person, place, and  time. He appears well-developed and well-nourished.  HENT:  Head: Normocephalic.  Eyes: EOM are normal.  Neck: Normal range of motion.  Cardiovascular: Normal rate.   Pulmonary/Chest: Effort normal.  Abdominal: He exhibits no distension.  Genitourinary: Penis normal.  Genitourinary Comments: Suprapubic catheter in place without surrounding signs of infection.  Obstructed  Musculoskeletal: Normal range of motion.  Neurological: He is alert and oriented to person, place, and time.  Psychiatric: He has a normal mood and affect.  Nursing note and vitals reviewed.    ED Treatments / Results  Labs (all labs ordered are listed, but only abnormal results are displayed) Labs Reviewed - No data to display  EKG  EKG Interpretation None       Radiology No results found.  Procedures BLADDER CATHETERIZATION Performed by: Jola Schmidt Authorized by: Jola Schmidt   Consent:    Consent obtained:  Verbal   Consent given by:  Patient and healthcare agent Pre-procedure details:    Procedure purpose: EXCHANGE OF EXISTING SUPRAPUBIC CATHETER.   Preparation: Patient was prepped and draped in usual sterile fashion   Anesthesia (see MAR for exact dosages):    Anesthesia method:  None Procedure details:    Catheter insertion:  Indwelling   Catheter type:  Foley   Catheter size:  20 Fr   Bladder irrigation: no     Number of attempts:  1   Urine  characteristics:  Clear Post-procedure details:    Patient tolerance of procedure:  Tolerated well, no immediate complications   (including critical care time)  Medications Ordered in ED Medications  cefTRIAXone (ROCEPHIN) injection 1 g (1 g Intramuscular Given 11/02/15 1054)  lidocaine (XYLOCAINE) 1 % (with pres) injection (20 mLs  Given 11/02/15 1054)     Initial Impression / Assessment and Plan / ED Course  I have reviewed the triage vital signs and the nursing notes.  Pertinent labs & imaging results that were available during my care of the patient were reviewed by me and considered in my medical decision making (see chart for details).  Clinical Course    Suprapubic catheter exchanged without difficulty.  Patient given IM Rocephin.  Patient be discharged home on Keflex.  I personally reviewed the patient's urine culture and its sensitivity patterns.  Flexion treatment without difficulty.  Primary care follow-up.  He understands to return to the ER for any new or worsening symptoms.  Overall well-appearing.  Vital signs are normal.  Doubt sepsis  Final Clinical Impressions(s) / ED Diagnoses   Final diagnoses:  Urinary retention  UTI (lower urinary tract infection)  Obstructed suprapubic catheter, initial encounter ([place])    New Prescriptions New Prescriptions   CEPHALEXIN (KEFLEX) 500 MG CAPSULE    Take 1 capsule (500 mg total) by mouth 3 (three) times daily.     Jola Schmidt, MD 11/02/15 [phone]

## 2015-11-02 NOTE — ED Notes (Signed)
With pt. And family verbal permission, Dr. Venora Maples replaces pt's. Suprapubic catheter at this time.

## 2015-11-02 NOTE — ED Triage Notes (Signed)
Pt was just seen last Saturday and diagnosed with a UTI. Family at bedside reports that he has a suprapubic catheter and that when she attempted to drain it this morning, the tube was clogged. Pt denies any pain at this time but reports that he is starting to feel some pressure. Family also reporting confusion but pt is able to tell me his name, exact date, and his location.

## 2015-11-08 DIAGNOSIS — N3020 Other chronic cystitis without hematuria: Secondary | ICD-10-CM | POA: Diagnosis not present

## 2015-11-08 DIAGNOSIS — R339 Retention of urine, unspecified: Secondary | ICD-10-CM | POA: Diagnosis not present

## 2015-11-08 DIAGNOSIS — N319 Neuromuscular dysfunction of bladder, unspecified: Secondary | ICD-10-CM | POA: Diagnosis not present

## 2015-12-05 DIAGNOSIS — R339 Retention of urine, unspecified: Secondary | ICD-10-CM | POA: Diagnosis not present

## 2015-12-10 DIAGNOSIS — Z08 Encounter for follow-up examination after completed treatment for malignant neoplasm: Secondary | ICD-10-CM | POA: Diagnosis not present

## 2015-12-10 DIAGNOSIS — L570 Actinic keratosis: Secondary | ICD-10-CM | POA: Diagnosis not present

## 2015-12-10 DIAGNOSIS — Z85820 Personal history of malignant melanoma of skin: Secondary | ICD-10-CM | POA: Diagnosis not present

## 2015-12-18 ENCOUNTER — Other Ambulatory Visit: Payer: Self-pay | Admitting: Internal Medicine

## 2016-01-01 ENCOUNTER — Other Ambulatory Visit: Payer: Self-pay | Admitting: Internal Medicine

## 2016-01-02 DIAGNOSIS — Z9359 Other cystostomy status: Secondary | ICD-10-CM | POA: Diagnosis not present

## 2016-01-10 ENCOUNTER — Encounter: Payer: Self-pay | Admitting: Internal Medicine

## 2016-01-10 ENCOUNTER — Ambulatory Visit (INDEPENDENT_AMBULATORY_CARE_PROVIDER_SITE_OTHER): Payer: Medicare HMO | Admitting: Internal Medicine

## 2016-01-10 VITALS — BP 132/78 | HR 70 | Temp 97.5°F

## 2016-01-10 DIAGNOSIS — I10 Essential (primary) hypertension: Secondary | ICD-10-CM | POA: Diagnosis not present

## 2016-01-10 DIAGNOSIS — F411 Generalized anxiety disorder: Secondary | ICD-10-CM

## 2016-01-10 DIAGNOSIS — E785 Hyperlipidemia, unspecified: Secondary | ICD-10-CM

## 2016-01-10 DIAGNOSIS — R69 Illness, unspecified: Secondary | ICD-10-CM | POA: Diagnosis not present

## 2016-01-10 DIAGNOSIS — Z9359 Other cystostomy status: Secondary | ICD-10-CM | POA: Diagnosis not present

## 2016-01-10 DIAGNOSIS — N390 Urinary tract infection, site not specified: Secondary | ICD-10-CM

## 2016-01-10 DIAGNOSIS — R739 Hyperglycemia, unspecified: Secondary | ICD-10-CM | POA: Diagnosis not present

## 2016-01-10 NOTE — Patient Instructions (Signed)
Will call with lab results  Follow up with urology as scheduled  Follow up in 3 mos for routine visit

## 2016-01-10 NOTE — Progress Notes (Signed)
Patient ID: Ricardo [name], male   DOB: 10-Jan-1936, 80 y.o.   MRN: [id number]    Location:  [facility] Place of Service: OFFICE  Chief Complaint  Patient presents with  . Medical Management of Chronic Issues    3 month routine visit    HPI:  79 yo male seen today for f/u. He is feeling well overall. He was seen in the ED 9/30th for clogged foley cath. suprapubic cath reinserted. he was tx for UTI with keflex.  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 His peripheral vision is worsening. He has optic nerve damage. He is HOH. He wears a cochlear implant on left. 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 Followed by podiatry  Prediabetic- A1c 5.9%. He has reduced complex CHO  He continues to have difficulty sleeping.   GERD sx's stable on protonix. Constipation stable on miralax. He takes probiotic daily.   Cholesterol stable on lipitor.   HTN - BP stable on losartan  Neurogenic bladder - foley cath is chronic. Takes trimethoprim for prevention. He has recurrent UTI hx and followed by Dr [name]. He had suprapubic cath exchanged on 9/30th in the ED. He is taking cranberry po daily. underwent cystourethroscopy and suprapubic cath re-insertion by Dr [name] 3/20th.    GAD/insomnia - mood stable on celexa. Takes trazodone qhs to help sleep. He also takes melatonin  COPD - stable on serevent  Past Medical History:  Diagnosis Date  . Anxiety   . Arthritis    neck   . 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 Cancer [facility]) 1956   left testicle  . COPD (chronic obstructive pulmonary disease) ([place])   . 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 Eczema   . Enlarged prostate   . High cholesterol   . Hypertension   . Loss of balance    due to meningitis  . Meningitis   . Neurogenic bladder    History of  . Numbness    in feet due to meningitis   . Rotator cuff tear    right  . Skin cancer   . Stroke [facility])    mini strokes in hospital with meningitis  . Unable to walk    due to meningitis    Past  Surgical History:  Procedure Laterality Date  . APPENDECTOMY    . COCHLEAR IMPLANT    . CYSTOSCOPY WITH LITHOLAPAXY N/A 05/14/2014   Procedure: CYSTOSCOPY WITH LITHOLAPAXY;  Surgeon: [name], MD;  [facility];  Service: Urology;  Laterality: N/A;  . CYSTOSCOPY WITH LITHOLAPAXY N/A 04/22/2015   Procedure: CYSTOSCOPY WITH LITHOLAPAXY;  Surgeon: [name], MD;  [facility];  Service: Urology;  Laterality: N/A;  . INSERTION OF SUPRAPUBIC CATHETER N/A 05/14/2014   Procedure: INSERTION OF SUPRAPUBIC CATHETER;  Surgeon: [name], MD;  [facility];  Service: Urology;  Laterality: N/A;  . INSERTION OF SUPRAPUBIC CATHETER N/A 04/22/2015   Procedure: INSERTION OF SUPRAPUBIC CATHETER;  Surgeon: [name], MD;  [facility]: [facility];  Service: Urology;  Laterality: N/A;  . PROSTATE SURGERY     biopsy x 2  . TESTICLE REMOVAL Left    October 1956  . TONSILLECTOMY     childhood    Patient Care Team: [name], DO as PCP - General (Internal Medicine) [name], MD as Consulting Physician (Urology)  Social History   Social History  . Marital status: Widowed    Spouse name: N/A  . Number  of children: N/A  . Years of education: N/A   Occupational History  . Not on file.   Social History Main Topics  . Smoking status: Former Smoker    Years: 10.00    Types: Cigarettes    Start date: 02/10/1955    Quit date: 02/09/1965  . Smokeless tobacco: Never Used  . Alcohol use No  . Drug use: No  . Sexual activity: Not on file   Other Topics Concern  . Not on file   Social History Narrative   Diet- Fairly balanced   Caffeine- Yes, Coffee   Married- 1964, [place] living now with daughter, all on the first floor   Pets-4, dog and Radiation protection practitioner, English as a second language teacher for [employer], Chief Financial Officer, Freight forwarder   Exercise- Yes, Rehab 3 times a week   Living will- Yes   DNR- Yes   POA/HPOA- Yes           reports that he quit  smoking about 50 years ago. His smoking use included Cigarettes. He started smoking about 60 years ago. He quit after 10.00 years of use. He has never used smokeless tobacco. He reports that he does not drink alcohol or use drugs.  Family History  Problem Relation Age of Onset  . Stroke Mother   . Heart failure Father    Family Status  Relation Status  . Mother Deceased at age 65  . Father Deceased at age 33  . Daughter Alive  . Son Alive     Allergies  Allergen Reactions  . Peanut Oil Shortness Of Breath, Itching and Swelling    Swelling of the throat   . Peanut-Containing Drug Products Shortness Of Breath and Swelling    Swelling of the throat     Medications: Patient's Medications  New Prescriptions   No medications on file  Previous Medications   AMLODIPINE (NORVASC) 5 MG TABLET    TAKE 1 TABLET (5 MG TOTAL) BY MOUTH DAILY.   ASPIRIN 81 MG CHEWABLE TABLET    Take one tablet by mouth once daily   ATORVASTATIN (LIPITOR) 10 MG TABLET    TAKE 1 TABLET (10 MG TOTAL) BY MOUTH AT BEDTIME.   CITALOPRAM (CELEXA) 20 MG TABLET    Take one tablet by mouth once daily for anxiety   CRANBERRY (ELLURA PO)    Take 2 tablets by mouth at bedtime.   LACTOBACILLUS (ACIDOPHILUS) 100 MG CAPS    Take 100 mg by mouth every morning.    LOSARTAN (COZAAR) 25 MG TABLET    Take one tablet by mouth once daily to control blood pressure   MAGNESIUM HYDROXIDE (MILK OF MAGNESIA) 400 MG/5ML SUSPENSION    Take 15 mLs by mouth daily as needed for mild constipation.   MELATONIN 10 MG TABS    Take 10 mg by mouth at bedtime.    NAPROXEN SODIUM (ANAPROX) 220 MG TABLET    Take 220-440 mg by mouth every 12 (twelve) hours as needed (pain).    PANTOPRAZOLE (PROTONIX) 40 MG TABLET    TAKE ONE TABLET BY MOUTH ONCE DAILY FOR STOMACH   SALMETEROL (SEREVENT DISKUS) 50 MCG/DOSE DISKUS INHALER    TAKE 1 PUFF TWICE A DAY   TRAZODONE (DESYREL) 50 MG TABLET    Take one tablet by mouth at bedtime   TRIAMCINOLONE CREAM  (KENALOG) 0.1 %    Apply 1 application topically daily.  Modified Medications   No medications on file  Discontinued Medications  CEPHALEXIN (KEFLEX) 500 MG CAPSULE    Take 1 capsule (500 mg total) by mouth 3 (three) times daily.    Review of Systems  Eyes: Positive for visual disturbance.  Musculoskeletal: Positive for arthralgias, gait problem and joint swelling.  Skin: Negative for rash.  All other systems reviewed and are negative.   Vitals:   01/10/16 1144  BP: 132/78  Pulse: 70  Temp: 97.5 F (36.4 C)  TempSrc: Oral  SpO2: 98%   There is no height or weight on file to calculate BMI.  Physical Exam  Constitutional: He is oriented to person, place, and time. He appears well-developed and well-nourished. No distress.  Frail appearing in NAD, sitting in w/c  HENT:  Head: Normocephalic and atraumatic.  Right Ear: Hearing, tympanic membrane, external ear and ear canal normal.  Left Ear: Hearing, tympanic membrane, external ear and ear canal normal.  Mouth/Throat: Uvula is midline, oropharynx is clear and moist and mucous membranes are normal. He does not have dentures.  Left Cochlear implant intact  Eyes: Conjunctivae, EOM and lids are normal. Pupils are equal, round, and reactive to light. No scleral icterus.  Blind in OU  Neck: Trachea normal and normal range of motion. Neck supple. Carotid bruit is not present. No thyroid mass and no thyromegaly present.  Cardiovascular: Normal rate, regular rhythm and intact distal pulses.  Exam reveals no gallop and no friction rub.   Murmur (1/6 SEM) heard. Trace LE edema b/l. No calf TTP  Pulmonary/Chest: Effort normal and breath sounds normal. He has no wheezes. He has no rhonchi. He has no rales. He exhibits no tenderness. Right breast exhibits no inverted nipple, no mass, no nipple discharge, no skin change and no tenderness. Left breast exhibits no inverted nipple, no mass, no nipple discharge, no skin change and no tenderness.  Breasts are symmetrical.  Abdominal: Soft. Normal appearance, normal aorta and bowel sounds are normal. He exhibits no distension, no abdominal bruit, no pulsatile midline mass and no mass. There is no hepatosplenomegaly. There is no tenderness. There is no rigidity, no rebound and no guarding. No hernia.  Genitourinary:  Genitourinary Comments: Suprapubic cath intact with foley DTG with no sediment changes noted  Musculoskeletal: Normal range of motion. He exhibits edema and tenderness.  Large R>L foot bunion, NT. R>L calf muscle hypertrophy with ropy tissue texture changes and TTP  Lymphadenopathy:       Head (right side): No posterior auricular adenopathy present.       Head (left side): No posterior auricular adenopathy present.    He has no cervical adenopathy.       Right: No supraclavicular adenopathy present.       Left: No supraclavicular adenopathy present.  Neurological: He is alert and oriented to person, place, and time. He has normal strength. He displays atrophy. No cranial nerve deficit. He exhibits abnormal muscle tone (R>L calf muscle). Gait abnormal.  Reflex Scores:      Patellar reflexes are 3+ on the right side. Skin: Skin is warm, dry and intact. Rash (at suprapubic cath insertion site with redness, flaking. no d/c at present) noted. Nails show no clubbing.  Psychiatric: He has a normal mood and affect. His speech is normal and behavior is normal. Thought content normal. Cognition and memory are normal.     Labs reviewed: Admission on 10/26/2015, Discharged on 10/26/2015  Component Date Value Ref Range Status  . Color, Urine 10/26/2015 YELLOW  YELLOW Final  . APPearance 10/26/2015 CLOUDY* CLEAR Final  .  Specific Gravity, Urine 10/26/2015 1.014  1.005 - 1.030 Final  . pH 10/26/2015 7.0  5.0 - 8.0 Final  . Glucose, UA 10/26/2015 NEGATIVE  NEGATIVE mg/dL Final  . Hgb urine dipstick 10/26/2015 LARGE* NEGATIVE Final  . Bilirubin Urine 10/26/2015 NEGATIVE  NEGATIVE Final    . Ketones, ur 10/26/2015 NEGATIVE  NEGATIVE mg/dL Final  . Protein, ur 10/26/2015 NEGATIVE  NEGATIVE mg/dL Final  . Nitrite 10/26/2015 POSITIVE* NEGATIVE Final  . Leukocytes, UA 10/26/2015 LARGE* NEGATIVE Final  . Specimen Description 10/28/2015 URINE, CLEAN CATCH   Final  . Special Requests 10/28/2015 NONE   Final  . Culture 10/28/2015 >=100,000 COLONIES/mL PROTEUS MIRABILIS*  Final  . Report Status 10/28/2015 10/28/2015 FINAL   Final  . Organism ID, Bacteria 10/28/2015 PROTEUS MIRABILIS*  Final  . Squamous Epithelial / LPF 10/26/2015 0-5* NONE SEEN Final  . WBC, UA 10/26/2015 TOO NUMEROUS TO COUNT  0 - 5 WBC/hpf Final  . RBC / HPF 10/26/2015 TOO NUMEROUS TO COUNT  0 - 5 RBC/hpf Final  . Bacteria, UA 10/26/2015 MANY* NONE SEEN Final    No results found.   Assessment/Plan   ICD-9-CM ICD-10-CM   1. Essential hypertension 401.9 I10 CMP with eGFR  2. Hyperlipidemia, unspecified hyperlipidemia type 272.4 E78.5 Lipid Panel  3. Hyperglycemia 790.29 R73.9 CMP with eGFR     Hemoglobin A1c  4. Suprapubic catheter ([place]) V44.59 Z93.59   5. Generalized anxiety disorder 300.02 F41.1 CMP with eGFR  6. Recurrent UTI 599.0 N39.0     Will call with lab results  Follow up with urology as scheduled  Follow up in 3 mos for routine visit  Allexis Bordenave S. [name]  [facility] [address] [place], [place] [address] [phone] Cell (Monday-Friday 8 AM - 5 PM) [phone] After 5 PM and follow prompts

## 2016-01-11 LAB — COMPLETE METABOLIC PANEL WITH GFR
ALT: 10 U/L (ref 9–46)
AST: 14 U/L (ref 10–35)
Albumin: 4.2 g/dL (ref 3.6–5.1)
Alkaline Phosphatase: 70 U/L (ref 40–115)
BUN: 18 mg/dL (ref 7–25)
CHLORIDE: 101 mmol/L (ref 98–110)
CO2: 24 mmol/L (ref 20–31)
Calcium: 9.1 mg/dL (ref 8.6–10.3)
Creat: 1.21 mg/dL — ABNORMAL HIGH (ref 0.70–1.18)
GFR, EST AFRICAN AMERICAN: 66 mL/min (ref >=60)
GFR, EST NON AFRICAN AMERICAN: 57 mL/min — AB (ref >=60)
Glucose, Bld: 81 mg/dL (ref 65–99)
POTASSIUM: 4.8 mmol/L (ref 3.5–5.3)
Sodium: 137 mmol/L (ref 135–146)
Total Bilirubin: 0.4 mg/dL (ref 0.2–1.2)
Total Protein: 7.1 g/dL (ref 6.1–8.1)

## 2016-01-11 LAB — LIPID PANEL
CHOL/HDL RATIO: 2.8 ratio (ref <5.0)
Cholesterol: 150 mg/dL (ref <200)
HDL: 54 mg/dL (ref >40)
LDL CALC: 70 mg/dL (ref <100)
TRIGLYCERIDES: 131 mg/dL (ref <150)
VLDL: 26 mg/dL (ref <30)

## 2016-01-11 LAB — HEMOGLOBIN A1C
Hgb A1c MFr Bld: 5.7 % — ABNORMAL HIGH (ref <5.7)
Mean Plasma Glucose: 117 mg/dL

## 2016-02-06 DIAGNOSIS — R339 Retention of urine, unspecified: Secondary | ICD-10-CM | POA: Diagnosis not present

## 2016-02-20 ENCOUNTER — Emergency Department (HOSPITAL_COMMUNITY)
Admission: EM | Admit: 2016-02-20 | Discharge: 2016-02-20 | Disposition: A | Discharge status: Home / Self Care | Payer: Medicare HMO | Attending: Emergency Medicine | Admitting: Emergency Medicine

## 2016-02-20 ENCOUNTER — Encounter (HOSPITAL_COMMUNITY): Payer: Self-pay | Admitting: Emergency Medicine

## 2016-02-20 DIAGNOSIS — Y639 Failure in dosage during unspecified surgical and medical care: Secondary | ICD-10-CM | POA: Insufficient documentation

## 2016-02-20 DIAGNOSIS — Z79899 Other long term (current) drug therapy: Secondary | ICD-10-CM | POA: Insufficient documentation

## 2016-02-20 DIAGNOSIS — Z85828 Personal history of other malignant neoplasm of skin: Secondary | ICD-10-CM | POA: Diagnosis not present

## 2016-02-20 DIAGNOSIS — Z7982 Long term (current) use of aspirin: Secondary | ICD-10-CM | POA: Insufficient documentation

## 2016-02-20 DIAGNOSIS — R1084 Generalized abdominal pain: Secondary | ICD-10-CM | POA: Diagnosis not present

## 2016-02-20 DIAGNOSIS — J449 Chronic obstructive pulmonary disease, unspecified: Secondary | ICD-10-CM | POA: Insufficient documentation

## 2016-02-20 DIAGNOSIS — Z91010 Allergy to peanuts: Secondary | ICD-10-CM | POA: Diagnosis not present

## 2016-02-20 DIAGNOSIS — Z87891 Personal history of nicotine dependence: Secondary | ICD-10-CM | POA: Insufficient documentation

## 2016-02-20 DIAGNOSIS — I10 Essential (primary) hypertension: Secondary | ICD-10-CM | POA: Insufficient documentation

## 2016-02-20 DIAGNOSIS — Z8547 Personal history of malignant neoplasm of testis: Secondary | ICD-10-CM | POA: Diagnosis not present

## 2016-02-20 DIAGNOSIS — Z8673 Personal history of transient ischemic attack (TIA), and cerebral infarction without residual deficits: Secondary | ICD-10-CM | POA: Insufficient documentation

## 2016-02-20 DIAGNOSIS — T83091A Other mechanical complication of indwelling urethral catheter, initial encounter: Secondary | ICD-10-CM | POA: Insufficient documentation

## 2016-02-20 LAB — URINALYSIS, ROUTINE W REFLEX MICROSCOPIC
Bilirubin Urine: NEGATIVE
Glucose, UA: NEGATIVE mg/dL
KETONES UR: NEGATIVE mg/dL
Nitrite: POSITIVE — AB
PH: 8 (ref 5.0–8.0)
Protein, ur: NEGATIVE mg/dL
SQUAMOUS EPITHELIAL / LPF: NONE SEEN
Specific Gravity, Urine: 1.009 (ref 1.005–1.030)

## 2016-02-20 MED ORDER — CEPHALEXIN 500 MG PO CAPS: 1000.0000 mg | ORAL_CAPSULE | Freq: Two times a day (BID) | ORAL | 0 refills | Status: DC

## 2016-02-20 NOTE — ED Notes (Signed)
Bed: [id number] Expected date:  Expected time:  Means of arrival:  Comments: EMS- 81yo M, clogged foley

## 2016-02-20 NOTE — ED Triage Notes (Addendum)
Pt states suprapubic urinary catheter has been clogged, some urinary output noted, pt states his daughter routinely flushes the catheter. Pt c/o pelvic pressure since this morning. (Pt is blind and has hearing device in L ear.)

## 2016-02-20 NOTE — Discharge Instructions (Signed)
Return here as needed.  Follow-up with your primary doctor. °

## 2016-02-20 NOTE — ED Provider Notes (Signed)
[place] DEPT Provider Note   CSN: [id number] Arrival date & time: 02/20/16  1042     History   Chief Complaint Chief Complaint  Patient presents with  . clogged urinary catheter    HPI Ricardo [name] is a 81 y.o. male.  HPI Patient presents to the emergency department with Foley catheter issues.  The patient has a suprapubic catheter.  The daughter states that he attempted to irrigate because the catheter was clogged.  Patient states that he has noted some fullness to the bladder region and discomfortThe patient denies chest pain, shortness of breath, headache,blurred vision, neck pain, fever, cough, weakness, numbness, dizziness, anorexia, edema, abdominal pain, nausea, vomiting, diarrhea, rash, back pain, dysuria, hematemesis, bloody stool, near syncope, or syncope. Past Medical History:  Diagnosis Date  . Anxiety   . Arthritis    neck   . 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 COPD (chronic obstructive pulmonary disease) ([place])   . 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 Eczema   . Enlarged prostate   . High cholesterol   . Hypertension   . Loss of balance    due to meningitis  . Meningitis   . Neurogenic bladder    History of  . Numbness    in feet due to meningitis   . Rotator cuff tear    right  . Skin cancer   . Stroke [facility])    mini strokes in hospital with meningitis  . Unable to walk    due to meningitis    Patient Active Problem List   Diagnosis Date Noted  . Mood disorder ([place]) 03/28/2015  . Insomnia 03/28/2015  . Anemia of chronic disease, IDA 03/15/2015  . Gait disturbance 03/15/2015  . Nonspecific abnormal electrocardiogram (ECG) (EKG) 03/15/2015  . Urinary tract infection due to Proteus, suprapubic cath  03/15/2015  . Acute kidney injury ([place]) 03/15/2015  . Acute encephalopathy 03/14/2015  . Impaired vision in both eyes 10/11/2014  . COPD (chronic obstructive pulmonary disease) ([place]) 04/21/2014  . Hyponatremia  04/21/2014  . Essential hypertension 02/09/2014  . BPH (benign prostatic hypertrophy) 02/09/2014    Past Surgical History:  Procedure Laterality Date  . APPENDECTOMY    . COCHLEAR IMPLANT    . CYSTOSCOPY WITH LITHOLAPAXY N/A 05/14/2014   Procedure: CYSTOSCOPY WITH LITHOLAPAXY;  Surgeon: [name], MD;  Location: [facility];  Service: Urology;  Laterality: N/A;  . CYSTOSCOPY WITH LITHOLAPAXY N/A 04/22/2015   Procedure: CYSTOSCOPY WITH LITHOLAPAXY;  Surgeon: [name], MD;  Location: [facility];  Service: Urology;  Laterality: N/A;  . INSERTION OF SUPRAPUBIC CATHETER N/A 05/14/2014   Procedure: INSERTION OF SUPRAPUBIC CATHETER;  Surgeon: [name], MD;  Location: [facility];  Service: Urology;  Laterality: N/A;  . INSERTION OF SUPRAPUBIC CATHETER N/A 04/22/2015   Procedure: INSERTION OF SUPRAPUBIC CATHETER;  Surgeon: [name], MD;  Location: [facility];  Service: Urology;  Laterality: N/A;  . PROSTATE SURGERY     biopsy x 2  . TESTICLE REMOVAL Left    October 1956  . TONSILLECTOMY     childhood       Home Medications    Prior to Admission medications   Medication Sig Start Date End Date Taking? Authorizing Provider  amLODipine (NORVASC) 5 MG tablet TAKE 1 TABLET (5 MG TOTAL) BY MOUTH DAILY. 12/18/15  Yes [name], DO  aspirin 81 MG chewable tablet Take one tablet by mouth  once daily 02/21/14  Yes [name], DO  atorvastatin (LIPITOR) 10 MG tablet TAKE 1 TABLET (10 MG TOTAL) BY MOUTH AT BEDTIME. 01/01/16  Yes [name], DO  citalopram (CELEXA) 20 MG tablet Take one tablet by mouth once daily for anxiety 09/23/15  Yes [name], DO  Lactobacillus (ACIDOPHILUS) 100 MG CAPS Take 100 mg by mouth every morning.    Yes Historical Provider, MD  losartan (COZAAR) 25 MG tablet Take one tablet by mouth once daily to control blood pressure 08/27/15  Yes [name], DO  magnesium hydroxide (MILK OF MAGNESIA) 400 MG/5ML suspension Take 15 mLs by mouth daily as  needed for mild constipation.   Yes Historical Provider, MD  Melatonin 10 MG TABS Take 10 mg by mouth at bedtime.    Yes Historical Provider, MD  salmeterol (SEREVENT DISKUS) 50 MCG/DOSE diskus inhaler TAKE 1 PUFF TWICE A DAY 08/27/15  Yes [name], DO  traZODone (DESYREL) 50 MG tablet Take one tablet by mouth at bedtime 10/31/15  Yes Lauree Chandler, NP  triamcinolone cream (KENALOG) 0.1 % Apply 1 application topically daily.   Yes Historical Provider, MD  naproxen sodium (ANAPROX) 220 MG tablet Take 220-440 mg by mouth every 12 (twelve) hours as needed (pain).     Historical Provider, MD  pantoprazole (PROTONIX) 40 MG tablet TAKE ONE TABLET BY MOUTH ONCE DAILY FOR STOMACH Patient not taking: Reported on 02/20/2016 12/18/15   [name], DO    Family History Family History  Problem Relation Age of Onset  . Stroke Mother   . Heart failure Father     Social History Social History  Substance Use Topics  . Smoking status: Former Smoker    Years: 10.00    Types: Cigarettes    Start date: 02/10/1955    Quit date: 02/09/1965  . Smokeless tobacco: Never Used  . Alcohol use No     Allergies   Peanut oil and Peanut-containing drug products   Review of Systems Review of Systems All other systems negative except as documented in the HPI. All pertinent positives and negatives as reviewed in the HPI.  Physical Exam Updated Vital Signs BP 147/73 (BP Location: Left Arm)   Pulse 73   Temp 97.3 F (36.3 C) (Oral)   Resp 20   SpO2 99%   Physical Exam  Constitutional: He is oriented to person, place, and time. He appears well-developed and well-nourished. No distress.  HENT:  Head: Normocephalic and atraumatic.  Mouth/Throat: Oropharynx is clear and moist.  Eyes: Pupils are equal, round, and reactive to light.  Neck: Normal range of motion. Neck supple.  Cardiovascular: Normal rate, regular rhythm and normal heart sounds.  Exam reveals no gallop and no friction rub.   No murmur  heard. Pulmonary/Chest: Effort normal and breath sounds normal. No respiratory distress. He has no wheezes.  Abdominal: Soft. Bowel sounds are normal. He exhibits no distension. There is no tenderness.  Neurological: He is alert and oriented to person, place, and time. He exhibits normal muscle tone. Coordination normal.  Skin: Skin is warm and dry. No rash noted. No erythema.  Psychiatric: He has a normal mood and affect. His behavior is normal.  Nursing note and vitals reviewed.    ED Treatments / Results  Labs (all labs ordered are listed, but only abnormal results are displayed) Labs Reviewed  URINALYSIS, ROUTINE W REFLEX MICROSCOPIC - Abnormal; Notable for the following:       Result Value   Hgb urine  dipstick LARGE (*)    Nitrite POSITIVE (*)    Leukocytes, UA MODERATE (*)    Bacteria, UA RARE (*)    All other components within normal limits  URINE CULTURE    EKG  EKG Interpretation None       Radiology No results found.  Procedures SUPRAPUBIC TUBE PLACEMENT Date/Time: 02/20/2016 1:07 PM Performed by: Dalia Heading Authorized by: Dalia Heading   Consent:    Consent obtained:  Verbal   Consent given by:  Patient and healthcare agent   Risks discussed:  Bleeding, bowel perforation, infection and pain   Alternatives discussed:  No treatment Universal protocol:    Patient identity confirmed:  Verbally with patient, arm band and hospital-assigned identification number Procedure details:    Complexity:  Simple   Catheter type:  Foley   Catheter size:  20 Fr   Ultrasound guidance: no     Number of attempts:  1   Urine characteristics:  Clear Post-procedure details:    Patient tolerance of procedure:  Tolerated well, no immediate complications   (including critical care time)  Medications Ordered in ED Medications - No data to display   Initial Impression / Assessment and Plan / ED Course  I have reviewed the triage vital signs and the nursing  notes.  Pertinent labs & imaging results that were available during my care of the patient were reviewed by me and considered in my medical decision making (see chart for details).    Patient is advised follow-up with his urologist.  Told to return here as needed  Final Clinical Impressions(s) / ED Diagnoses   Final diagnoses:  None    New Prescriptions New Prescriptions   No medications on file     Dalia Heading, PA-C 02/20/16 [place], MD 02/23/16 1614

## 2016-02-22 LAB — URINE CULTURE: Special Requests: NORMAL

## 2016-02-23 ENCOUNTER — Telehealth: Payer: Self-pay

## 2016-02-23 NOTE — Telephone Encounter (Signed)
Post ED Visit - Positive Culture Follow-up  Culture report reviewed by antimicrobial stewardship pharmacist:  []  Elenor Quinones, Pharm.D. []  Heide Guile, Pharm.D., BCPS []  Parks Neptune, Pharm.D. []  Alycia Rossetti, Pharm.D., BCPS []  [place], Pharm.D., BCPS, AAHIVP []  Legrand Como, Pharm.D., BCPS, AAHIVP []  Milus Glazier, Pharm.D. []  Stephens November, [place].D. Ebony Hail Masters Pharm D Positive urine culture Treated with Cephalexin, organism sensitive to the same and no further patient follow-up is required at this time.  Genia Del 02/23/2016, 9:07 AM

## 2016-02-24 DIAGNOSIS — N3020 Other chronic cystitis without hematuria: Secondary | ICD-10-CM | POA: Diagnosis not present

## 2016-02-24 DIAGNOSIS — R339 Retention of urine, unspecified: Secondary | ICD-10-CM | POA: Diagnosis not present

## 2016-02-24 DIAGNOSIS — N319 Neuromuscular dysfunction of bladder, unspecified: Secondary | ICD-10-CM | POA: Diagnosis not present

## 2016-03-05 ENCOUNTER — Other Ambulatory Visit: Payer: Self-pay | Admitting: Nurse Practitioner

## 2016-03-05 DIAGNOSIS — Z9359 Other cystostomy status: Secondary | ICD-10-CM | POA: Diagnosis not present

## 2016-03-05 DIAGNOSIS — R339 Retention of urine, unspecified: Secondary | ICD-10-CM | POA: Diagnosis not present

## 2016-03-10 ENCOUNTER — Emergency Department (HOSPITAL_COMMUNITY)
Admission: EM | Admit: 2016-03-10 | Discharge: 2016-03-10 | Disposition: A | Discharge status: Home / Self Care | Payer: Medicare HMO | Attending: Physician Assistant | Admitting: Physician Assistant

## 2016-03-10 ENCOUNTER — Emergency Department (HOSPITAL_COMMUNITY): Payer: Medicare HMO

## 2016-03-10 DIAGNOSIS — N3000 Acute cystitis without hematuria: Secondary | ICD-10-CM

## 2016-03-10 DIAGNOSIS — J449 Chronic obstructive pulmonary disease, unspecified: Secondary | ICD-10-CM | POA: Diagnosis not present

## 2016-03-10 DIAGNOSIS — Z7982 Long term (current) use of aspirin: Secondary | ICD-10-CM | POA: Insufficient documentation

## 2016-03-10 DIAGNOSIS — Z85828 Personal history of other malignant neoplasm of skin: Secondary | ICD-10-CM | POA: Insufficient documentation

## 2016-03-10 DIAGNOSIS — R509 Fever, unspecified: Secondary | ICD-10-CM | POA: Diagnosis not present

## 2016-03-10 DIAGNOSIS — Z79899 Other long term (current) drug therapy: Secondary | ICD-10-CM | POA: Insufficient documentation

## 2016-03-10 DIAGNOSIS — R42 Dizziness and giddiness: Secondary | ICD-10-CM | POA: Diagnosis not present

## 2016-03-10 DIAGNOSIS — Z91010 Allergy to peanuts: Secondary | ICD-10-CM | POA: Diagnosis not present

## 2016-03-10 DIAGNOSIS — F419 Anxiety disorder, unspecified: Secondary | ICD-10-CM | POA: Diagnosis present

## 2016-03-10 DIAGNOSIS — Z8673 Personal history of transient ischemic attack (TIA), and cerebral infarction without residual deficits: Secondary | ICD-10-CM | POA: Diagnosis not present

## 2016-03-10 DIAGNOSIS — R079 Chest pain, unspecified: Secondary | ICD-10-CM | POA: Diagnosis not present

## 2016-03-10 DIAGNOSIS — Z8547 Personal history of malignant neoplasm of testis: Secondary | ICD-10-CM | POA: Diagnosis not present

## 2016-03-10 DIAGNOSIS — I10 Essential (primary) hypertension: Secondary | ICD-10-CM | POA: Diagnosis not present

## 2016-03-10 DIAGNOSIS — Z87891 Personal history of nicotine dependence: Secondary | ICD-10-CM | POA: Diagnosis not present

## 2016-03-10 LAB — URINALYSIS, ROUTINE W REFLEX MICROSCOPIC
Bilirubin Urine: NEGATIVE
Glucose, UA: NEGATIVE mg/dL
Ketones, ur: NEGATIVE mg/dL
Nitrite: POSITIVE — AB
PROTEIN: 30 mg/dL — AB
SPECIFIC GRAVITY, URINE: 1.018 (ref 1.005–1.030)
pH: 7 (ref 5.0–8.0)

## 2016-03-10 LAB — I-STAT TROPONIN, ED: Troponin i, poc: 0 ng/mL (ref 0.00–0.08)

## 2016-03-10 LAB — INFLUENZA PANEL BY PCR (TYPE A & B)
INFLBPCR: NEGATIVE
Influenza A By PCR: NEGATIVE

## 2016-03-10 LAB — CBC WITH DIFFERENTIAL/PLATELET
BASOS PCT: 0 %
Basophils Absolute: 0 10*3/uL (ref 0.0–0.1)
EOS PCT: 0 %
Eosinophils Absolute: 0 10*3/uL (ref 0.0–0.7)
HEMATOCRIT: 36.2 % — AB (ref 39.0–52.0)
HEMOGLOBIN: 12.4 g/dL — AB (ref 13.0–17.0)
Lymphocytes Relative: 6 %
Lymphs Abs: 1.7 10*3/uL (ref 0.7–4.0)
MCH: 29.4 pg (ref 26.0–34.0)
MCHC: 34.3 g/dL (ref 30.0–36.0)
MCV: 85.8 fL (ref 78.0–100.0)
MONO ABS: 2.8 10*3/uL — AB (ref 0.1–1.0)
MONOS PCT: 10 %
Neutro Abs: 23.6 10*3/uL — ABNORMAL HIGH (ref 1.7–7.7)
Neutrophils Relative %: 84 %
PLATELETS: 234 10*3/uL (ref 150–400)
RBC: 4.22 MIL/uL (ref 4.22–5.81)
RDW: 13.7 % (ref 11.5–15.5)
WBC: 28.1 10*3/uL — AB (ref 4.0–10.5)

## 2016-03-10 LAB — COMPREHENSIVE METABOLIC PANEL
ALT: 15 U/L — AB (ref 17–63)
AST: 20 U/L (ref 15–41)
Albumin: 4.2 g/dL (ref 3.5–5.0)
Alkaline Phosphatase: 68 U/L (ref 38–126)
Anion gap: 10 (ref 5–15)
BUN: 21 mg/dL — ABNORMAL HIGH (ref 6–20)
CHLORIDE: 99 mmol/L — AB (ref 101–111)
CO2: 22 mmol/L (ref 22–32)
CREATININE: 1.18 mg/dL (ref 0.61–1.24)
Calcium: 8.6 mg/dL — ABNORMAL LOW (ref 8.9–10.3)
GFR calc Af Amer: >60 mL/min (ref >60)
GFR calc non Af Amer: 57 mL/min — ABNORMAL LOW (ref >60)
Glucose, Bld: 112 mg/dL — ABNORMAL HIGH (ref 65–99)
POTASSIUM: 3.7 mmol/L (ref 3.5–5.1)
SODIUM: 131 mmol/L — AB (ref 135–145)
Total Bilirubin: 1 mg/dL (ref 0.3–1.2)
Total Protein: 7.1 g/dL (ref 6.5–8.1)

## 2016-03-10 LAB — RAPID URINE DRUG SCREEN, HOSP PERFORMED
AMPHETAMINES: NOT DETECTED
BARBITURATES: NOT DETECTED
Benzodiazepines: NOT DETECTED
Cocaine: NOT DETECTED
Opiates: NOT DETECTED
TETRAHYDROCANNABINOL: NOT DETECTED

## 2016-03-10 LAB — PROTIME-INR
INR: 1.08
PROTHROMBIN TIME: 14 s (ref 11.4–15.2)

## 2016-03-10 LAB — I-STAT CG4 LACTIC ACID, ED: LACTIC ACID, VENOUS: 1.66 mmol/L (ref 0.5–1.9)

## 2016-03-10 MED ORDER — SODIUM CHLORIDE 0.9 % IV BOLUS (SEPSIS)
1000.0000 mL | Freq: Once | INTRAVENOUS | Status: AC
  Administered 2016-03-10: 1000 mL via INTRAVENOUS

## 2016-03-10 MED ORDER — ACETAMINOPHEN 325 MG PO TABS
650.0000 mg | ORAL_TABLET | Freq: Once | ORAL | Status: AC
  Administered 2016-03-10: 650 mg via ORAL
  Filled 2016-03-10: qty 2

## 2016-03-10 MED ORDER — DEXTROSE 5 % IV SOLN
2.0000 g | Freq: Once | INTRAVENOUS | Status: AC
  Administered 2016-03-10: 2 g via INTRAVENOUS
  Filled 2016-03-10: qty 2

## 2016-03-10 NOTE — ED Provider Notes (Signed)
[place] DEPT Provider Note   CSN: [id number] Arrival date & time: 03/10/16  1723     History   Chief Complaint Chief Complaint  Patient presents with  . Anxiety    HPI Ricardo [name] is a 81 y.o. male.  HPI  Pt here with confusion, shaking, bilateral leg pain.  Similar to pas episode fo hyponatremia per patient. Mild nausea. No recent med changes.  No cough.   Past Medical History:  Diagnosis Date  . Anxiety   . Arthritis    neck   . 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 Cancer [facility] 1956   left testicle  . COPD (chronic obstructive pulmonary disease) ([place])   . 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 Eczema   . Enlarged prostate   . High cholesterol   . Hypertension   . Loss of balance    due to meningitis  . Meningitis   . Neurogenic bladder    History of  . Numbness    in feet due to meningitis   . Rotator cuff tear    right  . Skin cancer   . Stroke [facility])    mini strokes in hospital with meningitis  . Unable to walk    due to meningitis    Patient Active Problem List   Diagnosis Date Noted  . Mood disorder ([place]) 03/28/2015  . Insomnia 03/28/2015  . Anemia of chronic disease, IDA 03/15/2015  . Gait disturbance 03/15/2015  . Nonspecific abnormal electrocardiogram (ECG) (EKG) 03/15/2015  . Urinary tract infection due to Proteus, suprapubic cath  03/15/2015  . Acute kidney injury ([place]) 03/15/2015  . Acute encephalopathy 03/14/2015  . Impaired vision in both eyes 10/11/2014  . COPD (chronic obstructive pulmonary disease) ([name]) 04/21/2014  . Hyponatremia 04/21/2014  . Essential hypertension 02/09/2014  . BPH (benign prostatic hypertrophy) 02/09/2014    Past Surgical History:  Procedure Laterality Date  . APPENDECTOMY    . COCHLEAR IMPLANT    . CYSTOSCOPY WITH LITHOLAPAXY N/A 05/14/2014   Procedure: CYSTOSCOPY WITH LITHOLAPAXY;  Surgeon: [name], MD;  [facility];  Service: Urology;  Laterality: N/A;  . CYSTOSCOPY WITH LITHOLAPAXY N/A  04/22/2015   Procedure: CYSTOSCOPY WITH LITHOLAPAXY;  Surgeon: [name], MD;  [facility];  Service: Urology;  Laterality: N/A;  . INSERTION OF SUPRAPUBIC CATHETER N/A 05/14/2014   Procedure: INSERTION OF SUPRAPUBIC CATHETER;  Surgeon: [name], MD;  [facility];  Service: Urology;  Laterality: N/A;  . INSERTION OF SUPRAPUBIC CATHETER N/A 04/22/2015   Procedure: INSERTION OF SUPRAPUBIC CATHETER;  Surgeon: [name], MD;  [facility];  Service: Urology;  Laterality: N/A;  . PROSTATE SURGERY     biopsy x 2  . TESTICLE REMOVAL Left    October 1956  . TONSILLECTOMY     childhood       Home Medications    Prior to Admission medications   Medication Sig Start Date End Date Taking? Authorizing Provider  amLODipine (NORVASC) 5 MG tablet TAKE 1 TABLET (5 MG TOTAL) BY MOUTH DAILY. 12/18/15  Yes [name], DO  aspirin 81 MG chewable tablet Take one tablet by mouth once daily 02/21/14  Yes [name], DO  atorvastatin (LIPITOR) 10 MG tablet TAKE 1 TABLET (10 MG TOTAL) BY MOUTH AT BEDTIME. 01/01/16  Yes [name], DO  citalopram (CELEXA) 20 MG tablet Take one tablet by mouth once daily for anxiety 09/23/15  Yes [name], DO  Lactobacillus (  ACIDOPHILUS) 100 MG CAPS Take 100 mg by mouth every morning.    Yes Historical Provider, MD  losartan (COZAAR) 25 MG tablet Take one tablet by mouth once daily to control blood pressure 08/27/15  Yes [name], DO  magnesium hydroxide (MILK OF MAGNESIA) 400 MG/5ML suspension Take 15 mLs by mouth daily as needed for mild constipation.   Yes Historical Provider, MD  Melatonin 10 MG TABS Take 10 mg by mouth at bedtime.    Yes Historical Provider, MD  pantoprazole (PROTONIX) 40 MG tablet TAKE ONE TABLET BY MOUTH ONCE DAILY FOR STOMACH 12/18/15  Yes [name], DO  salmeterol (SEREVENT DISKUS) 50 MCG/DOSE diskus inhaler TAKE 1 PUFF TWICE A DAY 08/27/15  Yes [name], DO  traZODone (DESYREL) 50 MG tablet  TAKE ONE TABLET BY MOUTH AT BEDTIME 03/05/16  Yes Lauree Chandler, NP  triamcinolone cream (KENALOG) 0.1 % Apply 1 application topically daily.   Yes Historical Provider, MD  cephALEXin (KEFLEX) 500 MG capsule Take 2 capsules (1,000 mg total) by mouth 2 (two) times daily. Patient not taking: Reported on 03/10/2016 02/20/16   Dalia Heading, PA-C    Family History Family History  Problem Relation Age of Onset  . Stroke Mother   . Heart failure Father     Social History Social History  Substance Use Topics  . Smoking status: Former Smoker    Years: 10.00    Types: Cigarettes    Start date: 02/10/1955    Quit date: 02/09/1965  . Smokeless tobacco: Never Used  . Alcohol use No     Allergies   Peanut oil and Peanut-containing drug products   Review of Systems Review of Systems  Constitutional: Positive for chills.  Respiratory: Negative for cough and shortness of breath.   Gastrointestinal: Negative for abdominal pain.  Musculoskeletal: Negative for back pain.  Neurological: Positive for dizziness and light-headedness.  All other systems reviewed and are negative.    Physical Exam Updated Vital Signs BP 111/62   Pulse 92   Temp 100.5 F (38.1 C) (Oral)   Resp 26   SpO2 94%   Physical Exam  Constitutional: He is oriented to person, place, and time. He appears well-nourished.  Feels febrile on exam  HENT:  Head: Normocephalic.  Eyes: Conjunctivae are normal.  Cardiovascular: Normal rate and regular rhythm.   No murmur heard. Pulmonary/Chest: Effort normal and breath sounds normal. No respiratory distress. He has no wheezes.  Abdominal: Soft.  Suprapubic cath in place  Neurological: He is oriented to person, place, and time.  Skin: Skin is warm and dry. He is not diaphoretic.  Psychiatric: He has a normal mood and affect. His behavior is normal.     ED Treatments / Results  Labs (all labs ordered are listed, but only abnormal results are displayed) Labs  Reviewed  COMPREHENSIVE METABOLIC PANEL - Abnormal; Notable for the following:       Result Value   Sodium 131 (*)    Chloride 99 (*)    Glucose, Bld 112 (*)    BUN 21 (*)    Calcium 8.6 (*)    ALT 15 (*)    GFR calc non Af Amer 57 (*)    All other components within normal limits  CBC WITH DIFFERENTIAL/PLATELET - Abnormal; Notable for the following:    WBC 28.1 (*)    Hemoglobin 12.4 (*)    HCT 36.2 (*)    Neutro Abs 23.6 (*)    Monocytes Absolute 2.8 (*)  All other components within normal limits  URINALYSIS, ROUTINE W REFLEX MICROSCOPIC - Abnormal; Notable for the following:    APPearance HAZY (*)    Hgb urine dipstick SMALL (*)    Protein, ur 30 (*)    Nitrite POSITIVE (*)    Leukocytes, UA LARGE (*)    Bacteria, UA RARE (*)    Squamous Epithelial / LPF 0-5 (*)    All other components within normal limits  URINE CULTURE  RAPID URINE DRUG SCREEN, HOSP PERFORMED  PROTIME-INR  INFLUENZA PANEL BY PCR (TYPE A & B)  I-STAT CG4 LACTIC ACID, ED  I-STAT TROPOININ, ED    EKG  EKG Interpretation  Date/Time:  Tuesday March 10 2016 18:40:19 EST Ventricular Rate:  93 PR Interval:    QRS Duration: 117 QT Interval:  377 QTC Calculation: 469 R Axis:   64 Text Interpretation:  Sinus rhythm Nonspecific intraventricular conduction delay Probable anterolateral infarct, old Baseline wander in lead(s) V5 No significant change since last tracing Confirmed by Zenia Resides  MD, ANTHONY ([id number]) on 03/11/2016 5:34:54 PM       Radiology Dg Chest 2 View  Result Date: 03/10/2016 CLINICAL DATA:  Chest pain and fever EXAM: CHEST  2 VIEW COMPARISON:  Chest radiograph 03/14/2015 FINDINGS: Unchanged mild cardiomegaly. No focal airspace consolidation or pulmonary edema. No pneumothorax or sizable pleural effusion. IMPRESSION: No active cardiopulmonary disease. Electronically Signed   By: Ulyses Jarred M.D.   On: 03/10/2016 19:19    Procedures Procedures (including critical care time)  Medications  Ordered in ED Medications  acetaminophen (TYLENOL) tablet 650 mg (650 mg Oral Given 03/10/16 2001)  sodium chloride 0.9 % bolus 1,000 mL (0 mLs Intravenous Stopped 03/10/16 2247)  cefTRIAXone (ROCEPHIN) 2 g in dextrose 5 % 50 mL IVPB (0 g Intravenous Stopped 03/10/16 2247)     Initial Impression / Assessment and Plan / ED Course  I have reviewed the triage vital signs and the nursing notes.  Pertinent labs & imaging results that were available during my care of the patient were reviewed by me and considered in my medical decision making (see chart for details).    pT here with rigors, body aches and fever. He thought it was from hyponatremia. I suspect infection, work up for infection, flu.  Pt has had recurrent UTIs in the past with indwelling catheter,.  Has a RX for keflex at home already.  Pt nitrate psoitive here, which likely explains fever. Patient is still taking PO and normal vitals. Discussed discharge vs hospital care. Daughter very involved and willing to try home based therapy first. He will follow up with urologist.   Final Clinical Impressions(s) / ED Diagnoses   Final diagnoses:  Acute cystitis without hematuria    New Prescriptions Discharge Medication List as of 03/10/2016 10:30 PM       Carlton Sweaney Julio Alm, MD 03/11/16 2157

## 2016-03-10 NOTE — ED Triage Notes (Signed)
Per pt "I had the shakes really bad today and my daughter is concerned because last February when this happened I had low sodium." Pt presents with no tremor. Per daughter pt has had problems remembering things which is not the pt norm. Pt displays no cognition deficits at this time.

## 2016-03-10 NOTE — ED Notes (Signed)
Bed: WA03 Expected date:  Expected time:  Means of arrival:  Comments: EMS-blind/AMS

## 2016-03-10 NOTE — Discharge Instructions (Signed)
You had fever as well as evidence of urinary tract infection in your urine today. It was nitrate positive. We know you were given a new perception for Keflex and recommend continued to take at home. We have givenyou one dose of IV antbitotics  here. If you have trouble eating, have increased fevers, or feelings of dehydration please return immediately to the emergency department.

## 2016-03-13 LAB — URINE CULTURE: Culture: >=100000 — AB

## 2016-03-20 ENCOUNTER — Ambulatory Visit (INDEPENDENT_AMBULATORY_CARE_PROVIDER_SITE_OTHER): Payer: Medicare HMO

## 2016-03-20 VITALS — BP 130/70 | HR 73 | Temp 97.5°F | Ht 73.0 in | Wt 192.8 lb

## 2016-03-20 DIAGNOSIS — Z0000 Encounter for general adult medical examination without abnormal findings: Secondary | ICD-10-CM | POA: Diagnosis not present

## 2016-03-20 NOTE — Progress Notes (Signed)
Quick Notes   Health Maintenance:  None    Abnormal Screen: None; Unable to do MMSE     Patient Concerns:  None     Nurse Concerns: None

## 2016-03-20 NOTE — Patient Instructions (Addendum)
Ricardo [name] , Thank you for taking time to come for your Medicare Wellness Visit. I appreciate your ongoing commitment to your health goals. Please review the following plan we discussed and let me know if I can assist you in the future.   These are the goals we discussed: Goals    . <enter goal here>          Starting 03/20/16, I will maintain my current lifestyle.        This is a list of the screening recommended for you and due dates:  Health Maintenance  Topic Date Due  . Tetanus Vaccine  12/30/1954  . Pneumonia vaccines (2 of 2 - PCV13) 08/15/2014  . Flu Shot  Completed  . Shingles Vaccine  Completed  Preventive Care for Adults  A healthy lifestyle and preventive care can promote health and wellness. Preventive health guidelines for adults include the following key practices.  . A routine yearly physical is a good way to check with your health care provider about your health and preventive screening. It is a chance to share any concerns and updates on your health and to receive a thorough exam.  . Visit your dentist for a routine exam and preventive care every 6 months. Brush your teeth twice a day and floss once a day. Good oral hygiene prevents tooth decay and gum disease.  . The frequency of eye exams is based on your age, health, family medical history, use  of contact lenses, and other factors. Follow your health care provider's ecommendations for frequency of eye exams.  . Eat a healthy diet. Foods like vegetables, fruits, whole grains, low-fat dairy products, and lean protein foods contain the nutrients you need without too many calories. Decrease your intake of foods high in solid fats, added sugars, and salt. Eat the right amount of calories for you. Get information about a proper diet from your health care provider, if necessary.  . Regular physical exercise is one of the most important things you can do for your health. Most adults should get at least 150 minutes of  moderate-intensity exercise (any activity that increases your heart rate and causes you to sweat) each week. In addition, most adults need muscle-strengthening exercises on 2 or more days a week.  Silver Sneakers may be a benefit available to you. To determine eligibility, you may visit the website: www.silversneakers.com or contact program at (712)579-1599 Mon-Fri between 8AM-8PM.   . Maintain a healthy weight. The body mass index (BMI) is a screening tool to identify possible weight problems. It provides an estimate of body fat based on height and weight. Your health care provider can find your BMI and can help you achieve or maintain a healthy weight.   For adults 20 years and older: ? A BMI below 18.5 is considered underweight. ? A BMI of 18.5 to 24.9 is normal. ? A BMI of 25 to 29.9 is considered overweight. ? A BMI of 30 and above is considered obese.   . Maintain normal blood lipids and cholesterol levels by exercising and minimizing your intake of saturated fat. Eat a balanced diet with plenty of fruit and vegetables. Blood tests for lipids and cholesterol should begin at age 41 and be repeated every 5 years. If your lipid or cholesterol levels are high, you are over 50, or you are at high risk for heart disease, you may need your cholesterol levels checked more frequently. Ongoing high lipid and cholesterol levels should be treated with medicines  if diet and exercise are not working.  . If you smoke, find out from your health care provider how to quit. If you do not use tobacco, please do not start.  . If you choose to drink alcohol, please do not consume more than 2 drinks per day. One drink is considered to be 12 ounces (355 mL) of beer, 5 ounces (148 mL) of wine, or 1.5 ounces (44 mL) of liquor.  . If you are 46-26 years old, ask your health care provider if you should take aspirin to prevent strokes.  . Use sunscreen. Apply sunscreen liberally and repeatedly throughout the day. You  should seek shade when your shadow is shorter than you. Protect yourself by wearing long sleeves, pants, a wide-brimmed hat, and sunglasses year round, whenever you are outdoors.  . Once a month, do a whole body skin exam, using a mirror to look at the skin on your back. Tell your health care provider of new moles, moles that have irregular borders, moles that are larger than a pencil eraser, or moles that have changed in shape or color.

## 2016-03-20 NOTE — Progress Notes (Signed)
Subjective:   Ricardo [name] is a 81 y.o. male who presents for an Initial Medicare Annual Wellness Visit.  Review of Systems  Cardiac Risk Factors include: advanced age (>88men, >53 women);dyslipidemia;family history of premature cardiovascular disease;hypertension;male gender;smoking/ tobacco exposure    Objective:    Today's Vitals   03/20/16 0956  BP: 130/70  Pulse: 73  Temp: 97.5 F (36.4 C)  TempSrc: Oral  SpO2: 97%  Weight: 192 lb 12.8 oz (87.5 kg)  Height: 6\' 1"  (1.854 m)   Body mass index is 25.44 kg/m.  Current Medications (verified) Outpatient Encounter Prescriptions as of 03/20/2016  Medication Sig  . amLODipine (NORVASC) 5 MG tablet TAKE 1 TABLET (5 MG TOTAL) BY MOUTH DAILY.  Marland Kitchen aspirin 81 MG chewable tablet Take one tablet by mouth once daily  . atorvastatin (LIPITOR) 10 MG tablet TAKE 1 TABLET (10 MG TOTAL) BY MOUTH AT BEDTIME.  . cephALEXin (KEFLEX) 500 MG capsule Take 2 capsules (1,000 mg total) by mouth 2 (two) times daily.  . citalopram (CELEXA) 20 MG tablet Take one tablet by mouth once daily for anxiety  . Lactobacillus (ACIDOPHILUS) 100 MG CAPS Take 100 mg by mouth every morning.   Marland Kitchen losartan (COZAAR) 25 MG tablet Take one tablet by mouth once daily to control blood pressure  . magnesium hydroxide (MILK OF MAGNESIA) 400 MG/5ML suspension Take 15 mLs by mouth daily as needed for mild constipation.  . Melatonin 10 MG TABS Take 10 mg by mouth at bedtime.   Marland Kitchen neomycin-polymyxin B (NEOSPORIN) 40-200000 irrigation solution Irrigate with 15 mLs as directed 1 day or 1 dose.  . pantoprazole (PROTONIX) 40 MG tablet TAKE ONE TABLET BY MOUTH ONCE DAILY FOR STOMACH  . salmeterol (SEREVENT DISKUS) 50 MCG/DOSE diskus inhaler TAKE 1 PUFF TWICE A DAY  . sodium chloride 1 g tablet Take 1 g by mouth 2 (two) times daily with a meal.  . Soft Lens Products (SM SALINE SOLUTION) SOLN 60 drops by Does not apply route 1 day or 1 dose.  . traZODone (DESYREL) 50 MG tablet TAKE ONE  TABLET BY MOUTH AT BEDTIME  . triamcinolone cream (KENALOG) 0.1 % Apply 1 application topically daily.   Facility-Administered Encounter Medications as of 03/20/2016  Medication  . chlorhexidine (HIBICLENS) 4 % liquid    Allergies (verified) Peanut oil and Peanut-containing drug products   History: Past Medical History:  Diagnosis Date  . Anxiety   . Arthritis    neck   . 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 COPD (chronic obstructive pulmonary disease) ([place])   . 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 Eczema   . Enlarged prostate   . High cholesterol   . Hypertension   . Loss of balance    due to meningitis  . Meningitis   . Neurogenic bladder    History of  . Numbness    in feet due to meningitis   . Rotator cuff tear    right  . Skin cancer   . Stroke [facility])    mini strokes in hospital with meningitis  . Unable to walk    due to meningitis   Past Surgical History:  Procedure Laterality Date  . APPENDECTOMY    . COCHLEAR IMPLANT    . CYSTOSCOPY WITH LITHOLAPAXY N/A 05/14/2014   Procedure: CYSTOSCOPY WITH LITHOLAPAXY;  Surgeon: [name], MD;  Location: [facility];  Service: Urology;  Laterality:  N/A;  . CYSTOSCOPY WITH LITHOLAPAXY N/A 04/22/2015   Procedure: CYSTOSCOPY WITH LITHOLAPAXY;  Surgeon: [name], MD;  Location: [facility];  Service: Urology;  Laterality: N/A;  . INSERTION OF SUPRAPUBIC CATHETER N/A 05/14/2014   Procedure: INSERTION OF SUPRAPUBIC CATHETER;  Surgeon: [name], MD;  Location: [facility];  Service: Urology;  Laterality: N/A;  . INSERTION OF SUPRAPUBIC CATHETER N/A 04/22/2015   Procedure: INSERTION OF SUPRAPUBIC CATHETER;  Surgeon: [name], MD;  Location: [facility];  Service: Urology;  Laterality: N/A;  . PROSTATE SURGERY     biopsy x 2  . TESTICLE REMOVAL Left    October 1956  . TONSILLECTOMY     childhood   Family History  Problem Relation Age of Onset  . Stroke Mother   . Heart failure  Father    Social History   Occupational History  . Not on file.   Social History Main Topics  . Smoking status: Former Smoker    Years: 10.00    Types: Cigarettes    Start date: 02/10/1955    Quit date: 02/09/1965  . Smokeless tobacco: Never Used  . Alcohol use No  . Drug use: No  . Sexual activity: No   Tobacco Counseling Counseling given: No   Activities of Daily Living In your present state of health, do you have any difficulty performing the following activities: 03/20/2016 10/11/2015  Hearing? Tempie Donning  Vision? Y Y  Difficulty concentrating or making decisions? N N  Walking or climbing stairs? Y Y  Dressing or bathing? Y Y  Doing errands, shopping? Tempie Donning  Preparing Food and eating ? Y -  Using the Toilet? Y -  In the past six months, have you accidently leaked urine? N -  Do you have problems with loss of bowel control? N -  Managing your Medications? Y -  Managing your Finances? Y -  Housekeeping or managing your Housekeeping? Y -  Some recent data might be hidden    Immunizations and Health Maintenance Immunization History  Administered Date(s) Administered  . Influenza,inj,Quad PF,36+ Mos 10/10/2014, 10/11/2015  . Influenza-Unspecified 02/03/2012  . Pneumococcal Polysaccharide-23 08/14/2013  . Pneumococcal-Unspecified 02/02/2013  . Zoster 02/03/2011   Health Maintenance Due  Topic Date Due  . TETANUS/TDAP  12/30/1956  . PNA vac Low Risk Adult (2 of 2 - PCV13) 08/15/2014    Patient Care Team: [name], DO as PCP - General (Internal Medicine) [name], MD as Consulting Physician (Urology)  Indicate any recent Medical Services you may have received from other than [facility] providers in the past year (date may be approximate).    Assessment:   This is a routine wellness examination for [facility].  Hearing/Vision screen Hearing Screening Comments: Pt has cochlear implant on left ear. Pt is completely deaf in right ear. Pt is due for F/U. Vision Screening  Comments: Last eye exam was done in [place]. Pt is blind in left eye. 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   Dietary issues and exercise activities discussed: Current Exercise Habits: Home exercise routine, Type of exercise: walking;stretching;strength training/weights;exercise ball, Time (Minutes): 60, Frequency (Times/Week): 7, Weekly Exercise (Minutes/Week): 420, Intensity: Moderate  Goals    . <enter goal here>          Starting 03/20/16, I will maintain my current lifestyle.       Depression Screen PHQ 2/9 Scores 03/20/2016 10/11/2015 05/11/2014  PHQ - 2 Score 0 0 0    Fall Risk Fall Risk  03/20/2016  01/10/2016 10/23/2015 10/11/2015 07/10/2015  Falls in the past year? Yes Yes Yes Yes Yes  Number falls in past yr: 1 1 2  or more 2 or more 1  Injury with Fall? No No Yes Yes No  Risk Factor Category  High Fall Risk - - - -  Risk for fall due to : Impaired balance/gait;Impaired mobility;Impaired vision;History of fall(s) - - - -  Follow up Falls prevention discussed - - - -    Cognitive Function: MMSE - Mini Mental State Exam 10/11/2015 10/10/2014  Not completed: Unable to complete -  Orientation to time - 5  Orientation to Place - 5  Registration - 3  Attention/ Calculation - 5  Recall - 3  Language- name 2 objects - 2  Language- repeat - 1  Language- follow 3 step command - 3  Language- read & follow direction - 0  Write a sentence - 0  Copy design - 0  Total score - 27        Screening Tests Health Maintenance  Topic Date Due  . TETANUS/TDAP  12/30/1956  . PNA vac Low Risk Adult (2 of 2 - PCV13) 08/15/2014  . INFLUENZA VACCINE  Completed  . ZOSTAVAX  Completed        Plan:    I have personally reviewed and addressed the Medicare Annual Wellness questionnaire and have noted the following in the patient's chart:  A. Medical and social history B. Use of alcohol, tobacco or illicit drugs  C. Current medications and supplements D. Functional ability and status E.    Nutritional status F.  Physical activity G. Advance directives H. List of other physicians I.  Hospitalizations, surgeries, and ER visits in previous 12 months J.  [place] to include hearing, vision, cognitive, depression L. Referrals and appointments - none  In addition, I have reviewed and discussed with patient certain preventive protocols, quality metrics, and best practice recommendations. A written personalized care plan for preventive services as well as general preventive health recommendations were provided to patient.  See attached scanned questionnaire for additional information.   Signed,   Allyn Kenner, LPN Health Advisor    I have reviewed the health advisor's note and was available for consultation. I agree with documentation and plan.   Yanky Vanderburg S. [name]  [facility] [address] [place], [place] [address] [phone] Cell (Monday-Friday 8 AM - 5 PM) [phone] After 5 PM and follow prompts

## 2016-03-25 ENCOUNTER — Other Ambulatory Visit: Payer: Self-pay | Admitting: Internal Medicine

## 2016-03-26 DIAGNOSIS — Z9359 Other cystostomy status: Secondary | ICD-10-CM | POA: Diagnosis not present

## 2016-03-26 DIAGNOSIS — R339 Retention of urine, unspecified: Secondary | ICD-10-CM | POA: Diagnosis not present

## 2016-03-29 ENCOUNTER — Other Ambulatory Visit: Payer: Self-pay | Admitting: Internal Medicine

## 2016-04-02 ENCOUNTER — Other Ambulatory Visit: Payer: Self-pay

## 2016-04-02 MED ORDER — FLUTICASONE FUROATE-VILANTEROL 100-25 MCG/INH IN AEPB: 1.0000 | INHALATION_SPRAY | Freq: Every day | RESPIRATORY_TRACT | 4 refills | Status: DC

## 2016-04-16 DIAGNOSIS — R339 Retention of urine, unspecified: Secondary | ICD-10-CM | POA: Diagnosis not present

## 2016-05-07 DIAGNOSIS — R339 Retention of urine, unspecified: Secondary | ICD-10-CM | POA: Diagnosis not present

## 2016-05-11 DIAGNOSIS — N3020 Other chronic cystitis without hematuria: Secondary | ICD-10-CM | POA: Diagnosis not present

## 2016-05-11 DIAGNOSIS — R339 Retention of urine, unspecified: Secondary | ICD-10-CM | POA: Diagnosis not present

## 2016-05-11 DIAGNOSIS — N319 Neuromuscular dysfunction of bladder, unspecified: Secondary | ICD-10-CM | POA: Diagnosis not present

## 2016-05-19 ENCOUNTER — Emergency Department (HOSPITAL_COMMUNITY): Admission: EM | Admit: 2016-05-19 | Discharge: 2016-05-19 | Discharge status: Absent without leave | Payer: Medicare HMO

## 2016-05-19 DIAGNOSIS — R339 Retention of urine, unspecified: Secondary | ICD-10-CM | POA: Diagnosis not present

## 2016-05-29 DIAGNOSIS — R339 Retention of urine, unspecified: Secondary | ICD-10-CM | POA: Diagnosis not present

## 2016-05-31 ENCOUNTER — Other Ambulatory Visit: Payer: Self-pay | Admitting: Nurse Practitioner

## 2016-06-02 ENCOUNTER — Other Ambulatory Visit: Payer: Self-pay | Admitting: Internal Medicine

## 2016-06-05 ENCOUNTER — Telehealth: Payer: Self-pay | Admitting: Urology

## 2016-06-05 ENCOUNTER — Encounter: Payer: Self-pay | Admitting: Gastroenterology

## 2016-06-05 NOTE — Telephone Encounter (Signed)
Riley [name] is calling to request home [name] orders for the patient be faxed to: [phone]    She can be reached if necessary at: [phone]

## 2016-06-10 ENCOUNTER — Other Ambulatory Visit: Payer: Self-pay | Admitting: Internal Medicine

## 2016-06-15 DIAGNOSIS — R339 Retention of urine, unspecified: Secondary | ICD-10-CM | POA: Diagnosis not present

## 2016-06-23 ENCOUNTER — Other Ambulatory Visit: Payer: Self-pay | Admitting: Internal Medicine

## 2016-06-29 ENCOUNTER — Other Ambulatory Visit: Payer: Self-pay | Admitting: Internal Medicine

## 2016-07-09 DIAGNOSIS — R339 Retention of urine, unspecified: Secondary | ICD-10-CM | POA: Diagnosis not present

## 2016-07-21 ENCOUNTER — Encounter: Payer: Self-pay | Admitting: Urology

## 2016-07-21 ENCOUNTER — Ambulatory Visit: Payer: Medicare (Managed Care) | Attending: Urology | Admitting: Urology

## 2016-07-21 VITALS — BP 136/64 | HR 63 | Temp 98.6°F | Resp 16 | Ht 73.0 in | Wt 183.0 lb

## 2016-07-21 DIAGNOSIS — N319 Neuromuscular dysfunction of bladder, unspecified: Secondary | ICD-10-CM

## 2016-07-21 NOTE — Progress Notes (Signed)
Chief complaint: Neurogenic bladder    History of present illness: This patient has a history of a neurogenic bladder and has a chronic suprapubic tube in place.  The patient resides in [place] and spends 2 months in the summer and the [place] area.  The patient has a #20 [place] catheter that is changed every 2-3 weeks. His daughter reports that she flushes the tube daily with a neomycin solution. He often drains sediment and tries to push the fluids.  Here for a request from [facility] for catheter changes at home while here this summer.    Medications:   Current Outpatient Prescriptions   Medication    cephalexin (KEFLEX) 500 MG capsule    Lactobacillus (ACIDOPHILUS) 100 MG CAPS    amLODIPine (NORVASC) 5 MG tablet    citalopram (CELEXA) 20 MG tablet    Melatonin 10 MG CAPS    neomycin-polymyxin B (NEOSPORIN) 40-200000 irrigation solution    triamcinolone (KENALOG) 0.1 % cream    traZODone (DESYREL) 50 MG tablet    atorvastatin (LIPITOR) 10 MG tablet    pantoprazole (PROTONIX) 40 MG EC tablet    aspirin 81 MG tablet    Multiple Vitamins-Minerals (SENIOR MULTIVITAMIN PLUS) TABS    BENICAR HCT 40-25 MG per tablet    SEREVENT DISKUS 50 MCG/DOSE diskus inhaler     No current facility-administered medications for this visit.        Allergies:   Allergies   Allergen Reactions    Peanut-Derived      Throat swells      No Known Drug Allergy      Created by Conversion - 0;        Past medical history:   Past Medical History:   Diagnosis Date    Asthma     Cerebral artery occlusion with cerebral infarction     COPD (chronic obstructive pulmonary disease)     COPD (chronic obstructive pulmonary disease)     Hyperlipidemia     Hypertension     Meningitis     Testicle cancer        Past surgical history:   Past Surgical History:   Procedure Laterality Date    APPENDECTOMY      COCHLEAR IMPLANT      left ear    EYE SURGERY      PROSTATE SURGERY      superpubic cath      TESTICLE REMOVAL       TONSILLECTOMY AND ADENOIDECTOMY         Family history:   Family History   Problem Relation Age of Onset    Heart Disease Father        Social History:   Social History     Social History    Marital status: Widowed     Spouse name: N/A    Number of children: N/A    Years of education: N/A     Occupational History    Not on file.     Social History Main Topics    Smoking status: Former Smoker     Packs/day: 1.00     Years: 10.00     Types: Cigarettes     Quit date: 11/17/1965    Smokeless tobacco: Never Used    Alcohol use Not on file    Drug use: Not on file    Sexual activity: Not on file     Social History Narrative         REVIEW of  SYSTEMS    Constitutional: Negative.  HEENT: Hard of hearing  Respiratory: Negative  Cardiac: Negative  GI: Negative  GU: See above.  Musculoskeletal: Negative  Neurological: Negative  Hematological: Negative  Behavorial: Negative  Skin: Negative  Endocrine:Negative  Vascular:Negative    Vital signs: Blood pressure 136/64, pulse 63, temperature 37 C (98.6 F), resp. rate 16, height 1.854 m (6\' 1" ), weight 83 kg (183 lb).    Physical examination:  SP tube draining clear yellow urine      Assessment:This patient with a history of neurogenic bladder  With chronic SP tube in place.  Here in [place] for the summer and request [facility] referral for catheter changes at his daughter's home.    Plan:Referral made to [facility].      Scheryl Marten, NP 07/21/2016 10:23 AM

## 2016-07-21 NOTE — Progress Notes (Deleted)
Chief complaint: ***    History of present illness:***    Medications:   Current Outpatient Prescriptions   Medication    cephalexin (KEFLEX) 500 MG capsule    Lactobacillus (ACIDOPHILUS) 100 MG CAPS    amLODIPine (NORVASC) 5 MG tablet    citalopram (CELEXA) 20 MG tablet    Melatonin 10 MG CAPS    neomycin-polymyxin B (NEOSPORIN) 40-200000 irrigation solution    triamcinolone (KENALOG) 0.1 % cream    traZODone (DESYREL) 50 MG tablet    atorvastatin (LIPITOR) 10 MG tablet    pantoprazole (PROTONIX) 40 MG EC tablet    aspirin 81 MG tablet    Multiple Vitamins-Minerals (SENIOR MULTIVITAMIN PLUS) TABS    BENICAR HCT 40-25 MG per tablet    SEREVENT DISKUS 50 MCG/DOSE diskus inhaler     No current facility-administered medications for this visit.        Allergies:   Allergies   Allergen Reactions    Peanut-Derived      Throat swells      No Known Drug Allergy      Created by Conversion - 0;        Past medical history:   Past Medical History:   Diagnosis Date    Asthma     Cerebral artery occlusion with cerebral infarction     COPD (chronic obstructive pulmonary disease)     COPD (chronic obstructive pulmonary disease)     Hyperlipidemia     Hypertension     Meningitis     Testicle cancer        Past surgical history:   Past Surgical History:   Procedure Laterality Date    APPENDECTOMY      COCHLEAR IMPLANT      left ear    EYE SURGERY      PROSTATE SURGERY      superpubic cath      TESTICLE REMOVAL      TONSILLECTOMY AND ADENOIDECTOMY         Family history:   Family History   Problem Relation Age of Onset    Heart Disease Father        Social History:   Social History     Social History    Marital status: Widowed     Spouse name: N/A    Number of children: N/A    Years of education: N/A     Occupational History    Not on file.     Social History Main Topics    Smoking status: Former Smoker     Packs/day: 1.00     Years: 10.00     Types: Cigarettes     Quit date: 11/17/1965     Smokeless tobacco: Never Used    Alcohol use Not on file    Drug use: Not on file    Sexual activity: Not on file     Social History Narrative         Chief complaint: SP tube    History of present illness: This patient has a history of a neurogenic bladder and has a chronic suprapubic tube in place.  The patient resides in [place] and spends 2 months in the summer and the [place] area.  The patient has a #20 [place] catheter that is changed every 2-3 weeks.  The daughter describes that the tube often drains sediment and she flushes it on a daily basis. She also flushes it with a neomycin solution.  Here to request a  [facility] referral for catheter changes to be done at the daughter's home.    Medications:   Current Outpatient Prescriptions   Medication    cephalexin (KEFLEX) 500 MG capsule    Lactobacillus (ACIDOPHILUS) 100 MG CAPS    amLODIPine (NORVASC) 5 MG tablet    citalopram (CELEXA) 20 MG tablet    Melatonin 10 MG CAPS    neomycin-polymyxin B (NEOSPORIN) 40-200000 irrigation solution    triamcinolone (KENALOG) 0.1 % cream    traZODone (DESYREL) 50 MG tablet    atorvastatin (LIPITOR) 10 MG tablet    pantoprazole (PROTONIX) 40 MG EC tablet    aspirin 81 MG tablet    Multiple Vitamins-Minerals (SENIOR MULTIVITAMIN PLUS) TABS    BENICAR HCT 40-25 MG per tablet    SEREVENT DISKUS 50 MCG/DOSE diskus inhaler     No current facility-administered medications for this visit.        Allergies:   Allergies   Allergen Reactions    Peanut-Derived      Throat swells      No Known Drug Allergy      Created by Conversion - 0;        Past medical history:   Past Medical History:   Diagnosis Date    Asthma     Cerebral artery occlusion with cerebral infarction     COPD (chronic obstructive pulmonary disease)     COPD (chronic obstructive pulmonary disease)     Hyperlipidemia     Hypertension     Meningitis     Testicle cancer        Past surgical history:   Past Surgical History:   Procedure  Laterality Date    APPENDECTOMY      COCHLEAR IMPLANT      left ear    EYE SURGERY      PROSTATE SURGERY      superpubic cath      TESTICLE REMOVAL      TONSILLECTOMY AND ADENOIDECTOMY         Family history:   Family History   Problem Relation Age of Onset    Heart Disease Father        Social History:   Social History     Social History    Marital status: Widowed     Spouse name: N/A    Number of children: N/A    Years of education: N/A     Occupational History    Not on file.     Social History Main Topics    Smoking status: Former Smoker     Packs/day: 1.00     Years: 10.00     Types: Cigarettes     Quit date: 11/17/1965    Smokeless tobacco: Never Used    Alcohol use Not on file    Drug use: Not on file    Sexual activity: Not on file     Social History Narrative         REVIEW of SYSTEMS    Constitutional: Negative.  HEENT: Hard of hearing  Respiratory: Negative  Cardiac: Negative  GI: Negative  GU: See above.  Musculoskeletal: Negative  Neurological: Negative  Hematological: Negative  Behavorial: Negative  Skin: Negative  Endocrine:Negative  Vascular:Negative    Vital signs: Blood pressure 136/64, pulse 63, temperature 37 C (98.6 F), resp. rate 16, height 1.854 m (6\' 1" ), weight 83 kg (183 lb).    Physical examination:    Catheter draining clear yellow urine.  Assessment:This patient who has a history of neurogenic bladder with routine SP tube changes, here in the [place] area for the summer and requests [facility] referral for tube changes at home.    Plan: Referral made to [facility].        Scheryl Marten, NP 07/21/2016 10:16 AM    Vital signs: Blood pressure 136/64, pulse 63, temperature 37 C (98.6 F), resp. rate 16, height 1.854 m (6\' 1" ), weight 83 kg (183 lb).    Physical examination:            Assessment:***    Plan:***      Scheryl Marten, NP 07/21/2016 10:13 AM

## 2016-07-22 ENCOUNTER — Other Ambulatory Visit: Payer: Self-pay | Admitting: Urology

## 2016-07-22 DIAGNOSIS — R339 Retention of urine, unspecified: Secondary | ICD-10-CM

## 2016-07-22 NOTE — Telephone Encounter (Signed)
Done

## 2016-07-22 NOTE — Telephone Encounter (Signed)
Marissa from [facility] calling for clarification. She states someone called yesterday to ask their agency to hold off on opening the patient to home care, as his catheter is not due for change until next week. If this is the case, Marissa requests an order, indicating date to open patient to home care, to be faxed to [phone]    [facility] requests a call back at [phone] to clarify

## 2016-07-29 ENCOUNTER — Other Ambulatory Visit: Payer: Self-pay | Admitting: Internal Medicine

## 2016-07-30 ENCOUNTER — Telehealth: Payer: Self-pay | Admitting: Urology

## 2016-07-30 DIAGNOSIS — J449 Chronic obstructive pulmonary disease, unspecified: Secondary | ICD-10-CM | POA: Diagnosis not present

## 2016-07-30 DIAGNOSIS — J45909 Unspecified asthma, uncomplicated: Secondary | ICD-10-CM | POA: Diagnosis not present

## 2016-07-30 DIAGNOSIS — Z87891 Personal history of nicotine dependence: Secondary | ICD-10-CM | POA: Diagnosis not present

## 2016-07-30 DIAGNOSIS — Z435 Encounter for attention to cystostomy: Secondary | ICD-10-CM | POA: Diagnosis not present

## 2016-07-30 DIAGNOSIS — N319 Neuromuscular dysfunction of bladder, unspecified: Secondary | ICD-10-CM | POA: Diagnosis not present

## 2016-07-30 DIAGNOSIS — I10 Essential (primary) hypertension: Secondary | ICD-10-CM | POA: Diagnosis not present

## 2016-07-30 NOTE — Telephone Encounter (Signed)
Georgina Snell calling with [facility] for patient, Mr. Derusha.     Stated patient was open to nursing care today (07/30/16).    Also wanted to report the possible drug interaction between traZODone (DESYREL) 50 MG tablet and citalopram (CELEXA) 20 MG tablet.    Stated patient was having a lot of clotting and infections w/catheter being change and was switched to every two weeks.       Stated she put the order in for every two weeks, but if the doctor disagrees she can be reached at the number provided below.       Is the caller requesting a call back from the team nurse? No, if any questions can call: [phone]  ,

## 2016-07-30 NOTE — Telephone Encounter (Signed)
noted 

## 2016-08-08 DIAGNOSIS — Z87891 Personal history of nicotine dependence: Secondary | ICD-10-CM | POA: Diagnosis not present

## 2016-08-08 DIAGNOSIS — I10 Essential (primary) hypertension: Secondary | ICD-10-CM | POA: Diagnosis not present

## 2016-08-08 DIAGNOSIS — Z435 Encounter for attention to cystostomy: Secondary | ICD-10-CM | POA: Diagnosis not present

## 2016-08-08 DIAGNOSIS — J45909 Unspecified asthma, uncomplicated: Secondary | ICD-10-CM | POA: Diagnosis not present

## 2016-08-08 DIAGNOSIS — N319 Neuromuscular dysfunction of bladder, unspecified: Secondary | ICD-10-CM | POA: Diagnosis not present

## 2016-08-08 DIAGNOSIS — J449 Chronic obstructive pulmonary disease, unspecified: Secondary | ICD-10-CM | POA: Diagnosis not present

## 2016-08-19 ENCOUNTER — Other Ambulatory Visit: Payer: Self-pay | Admitting: Internal Medicine

## 2016-08-21 DIAGNOSIS — Z435 Encounter for attention to cystostomy: Secondary | ICD-10-CM | POA: Diagnosis not present

## 2016-08-21 DIAGNOSIS — J449 Chronic obstructive pulmonary disease, unspecified: Secondary | ICD-10-CM | POA: Diagnosis not present

## 2016-08-21 DIAGNOSIS — J45909 Unspecified asthma, uncomplicated: Secondary | ICD-10-CM | POA: Diagnosis not present

## 2016-08-21 DIAGNOSIS — Z87891 Personal history of nicotine dependence: Secondary | ICD-10-CM | POA: Diagnosis not present

## 2016-08-21 DIAGNOSIS — N319 Neuromuscular dysfunction of bladder, unspecified: Secondary | ICD-10-CM | POA: Diagnosis not present

## 2016-08-21 DIAGNOSIS — I10 Essential (primary) hypertension: Secondary | ICD-10-CM | POA: Diagnosis not present

## 2016-08-24 ENCOUNTER — Other Ambulatory Visit: Payer: Self-pay | Admitting: Internal Medicine

## 2016-08-27 ENCOUNTER — Telehealth: Payer: Self-pay | Admitting: Urology

## 2016-08-27 NOTE — Telephone Encounter (Signed)
Coralyn Mark from [facility] is calling to advise the patient's visits will be changed from weekly to monthly. He can be reached at [phone]

## 2016-08-27 NOTE — Telephone Encounter (Signed)
Noted  

## 2016-09-04 DIAGNOSIS — Z87891 Personal history of nicotine dependence: Secondary | ICD-10-CM | POA: Diagnosis not present

## 2016-09-04 DIAGNOSIS — N319 Neuromuscular dysfunction of bladder, unspecified: Secondary | ICD-10-CM | POA: Diagnosis not present

## 2016-09-04 DIAGNOSIS — J45909 Unspecified asthma, uncomplicated: Secondary | ICD-10-CM | POA: Diagnosis not present

## 2016-09-04 DIAGNOSIS — J449 Chronic obstructive pulmonary disease, unspecified: Secondary | ICD-10-CM | POA: Diagnosis not present

## 2016-09-04 DIAGNOSIS — Z435 Encounter for attention to cystostomy: Secondary | ICD-10-CM | POA: Diagnosis not present

## 2016-09-04 DIAGNOSIS — I10 Essential (primary) hypertension: Secondary | ICD-10-CM | POA: Diagnosis not present

## 2016-09-07 ENCOUNTER — Other Ambulatory Visit: Payer: Self-pay | Admitting: Internal Medicine

## 2016-09-15 ENCOUNTER — Other Ambulatory Visit: Payer: Self-pay | Admitting: Internal Medicine

## 2016-09-18 ENCOUNTER — Telehealth: Payer: Self-pay | Admitting: Urology

## 2016-09-18 ENCOUNTER — Encounter: Payer: Self-pay | Admitting: Gastroenterology

## 2016-09-18 DIAGNOSIS — I10 Essential (primary) hypertension: Secondary | ICD-10-CM | POA: Diagnosis not present

## 2016-09-18 DIAGNOSIS — Z87891 Personal history of nicotine dependence: Secondary | ICD-10-CM | POA: Diagnosis not present

## 2016-09-18 DIAGNOSIS — J45909 Unspecified asthma, uncomplicated: Secondary | ICD-10-CM | POA: Diagnosis not present

## 2016-09-18 DIAGNOSIS — J449 Chronic obstructive pulmonary disease, unspecified: Secondary | ICD-10-CM | POA: Diagnosis not present

## 2016-09-18 DIAGNOSIS — N319 Neuromuscular dysfunction of bladder, unspecified: Secondary | ICD-10-CM | POA: Diagnosis not present

## 2016-09-18 DIAGNOSIS — Z435 Encounter for attention to cystostomy: Secondary | ICD-10-CM | POA: Diagnosis not present

## 2016-09-18 NOTE — Telephone Encounter (Signed)
Christine from [facility] called to let us know that this patient is being discharged from care today due to leaving this weekend for [facility] until next year.    Altha Harm can be reached at [phone]

## 2016-09-25 ENCOUNTER — Ambulatory Visit (INDEPENDENT_AMBULATORY_CARE_PROVIDER_SITE_OTHER): Payer: Medicare HMO | Admitting: Internal Medicine

## 2016-09-25 ENCOUNTER — Encounter: Payer: Self-pay | Admitting: Internal Medicine

## 2016-09-25 ENCOUNTER — Other Ambulatory Visit: Payer: Self-pay | Admitting: Internal Medicine

## 2016-09-25 VITALS — BP 122/60 | HR 71 | Temp 98.2°F | Ht 72.0 in | Wt 186.0 lb

## 2016-09-25 DIAGNOSIS — R739 Hyperglycemia, unspecified: Secondary | ICD-10-CM | POA: Diagnosis not present

## 2016-09-25 DIAGNOSIS — I10 Essential (primary) hypertension: Secondary | ICD-10-CM

## 2016-09-25 DIAGNOSIS — F411 Generalized anxiety disorder: Secondary | ICD-10-CM | POA: Diagnosis not present

## 2016-09-25 DIAGNOSIS — Z23 Encounter for immunization: Secondary | ICD-10-CM

## 2016-09-25 DIAGNOSIS — Z9359 Other cystostomy status: Secondary | ICD-10-CM | POA: Diagnosis not present

## 2016-09-25 DIAGNOSIS — E785 Hyperlipidemia, unspecified: Secondary | ICD-10-CM | POA: Diagnosis not present

## 2016-09-25 DIAGNOSIS — H543 Unqualified visual loss, both eyes: Secondary | ICD-10-CM | POA: Diagnosis not present

## 2016-09-25 DIAGNOSIS — Z0000 Encounter for general adult medical examination without abnormal findings: Secondary | ICD-10-CM | POA: Diagnosis not present

## 2016-09-25 DIAGNOSIS — R69 Illness, unspecified: Secondary | ICD-10-CM | POA: Diagnosis not present

## 2016-09-25 MED ORDER — TRAZODONE HCL 50 MG PO TABS: 50.0000 mg | ORAL_TABLET | Freq: Every day | ORAL | 0 refills | Status: DC

## 2016-09-25 NOTE — Patient Instructions (Addendum)
Recommend plain OTC claritin, allegra or zyrtec daily for post nasal drip  Continue other medications as ordered  Follow up with urology as scheduled - please discuss penile erosion with next catheter change. In meantime, use nonstick pad over area.  Follow up in 3 mos for HTN, hyperlipidemia, GAD

## 2016-09-25 NOTE — Progress Notes (Signed)
Patient ID: Ricardo [name], male   DOB: [birth year], 81 y.o.   MRN: [id number]   Location:  [facility]  Place of Service:  OFFICE  Provider: Arletha Grippe, DO  Patient Care Team: Ricardo Cranker, DO as PCP - General (Internal Medicine) Ricardo Clines, MD as Consulting Physician (Urology)  Extended Emergency Contact Information Primary Emergency Contact: Ricardo [name] Address: [place], [place] [address] Ricardo [name] Phone: [phone] Mobile Phone: [phone] Relation: Daughter  Code Status: FULL CODE Goals of Care: Advanced Directive information Advanced Directives 09/25/2016  Does Patient Have a Medical Advance Directive? Yes  Type of Paramedic of [place];Living will  Does patient want to make changes to medical advance directive? -  Copy of [place] in Chart? Yes  Would patient like information on creating a medical advance directive? -     Chief Complaint  Patient presents with  . Annual Exam    cpe    HPI: Patient is a 81 y.o. male seen in today for an annual wellness exam.  03/20/16 AWV note reviewed. MMSE unable to complete 2/2 pt blind. Sleeping  well on trazodone. Appetite is excellent and weight is down. he reports "an awful amount of gas". He has a BM every 48-72 hrs. If no BM in 4 days, he takes a mixture of prune juice (1/3 cup)/MOM 91 dose)/chocolate milk(1/3 cup) which helps. He is receiving home PT/OT and uses bands. He gets a lot of exercise. He was tx for UTI on Feb 2018. His catheter became clogged in 05/2016 and was seen by urology. Urology changed catheter. He just returned from [place] summer vacation. suprapubic Catheter changed last week in [place] and plan to have new catheter next week by Dr Ricardo [name]. 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 His peripheral vision is worsening. He has optic nerve damage. He is  HOH. He wears a cochlear implant on left. He has issues with hearing in loud places. 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 Followed by podiatry. Foot ulcer healed  Prediabetic- A1c 5.7%. He does not consume a lot of complex CHO  GERD sx's stable on protonix. Constipation stable on probiotic daily.   Hyperlipidemia - stable on lipitor.  LDL  HTN - BP stable on losartan, amlodipine. He takes ASA daily  Neurogenic bladder - foley cath is chronic. Takes trimethoprim for prevention. He has recurrent UTI hx and followed by Dr Ricardo [name]. He had suprapubic cath exchanged on 9/30th in the ED. He is taking cranberry po daily. underwent cystourethroscopy and suprapubic cath re-insertion by Dr Ricardo [name] 3/20th.    GAD/insomnia - mood stable on celexa. Takes trazodone qhs to help sleep. He also takes melatonin  COPD - stable on Breo. serevent no longer manufactured. He has increased post nasal drip since med changed. He has not tried OTC antihistamine yet  Depression screen [facility] 2/9 09/25/2016 03/20/2016 10/11/2015 05/11/2014  Decreased Interest 0 0 0 0  Down, Depressed, Hopeless 0 0 0 0  PHQ - 2 Score 0 0 0 0    Fall Risk  09/25/2016 03/20/2016 01/10/2016 10/23/2015 10/11/2015  Falls in the past year? No Yes Yes Yes Yes  Number falls in past yr: - 1 1 2  or more 2 or more  Comment - Pt fell at one  of his Doctor's appt.  - - -  Injury with Fall? - No No Yes Yes  Risk Factor Category  - High Fall Risk - - -  Risk for fall due to : - Impaired balance/gait;Impaired mobility;Impaired vision;History of fall(s) - - -  Follow up - Falls prevention discussed - - -   MMSE - Mini Mental State Exam 03/20/2016 10/11/2015 10/10/2014  Not completed: Unable to complete Unable to complete -  Orientation to time - - 5  Orientation to Place - - 5  Registration - - 3  Attention/ Calculation - - 5  Recall - - 3  Language- name 2 objects - - 2  Language- repeat - - 1  Language- follow 3 step command - - 3    Language- read & follow direction - - 0  Write a sentence - - 0  Copy design - - 0  Total score - - 27     Health Maintenance  Topic Date Due  . TETANUS/TDAP  12/30/1956  . PNA vac Low Risk Adult (2 of 2 - PCV13) 08/15/2014  . INFLUENZA VACCINE  09/02/2016    Past Medical History:  Diagnosis Date  . Anxiety   . Arthritis    neck   . 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 Cancer [facility]) 1956   left testicle  . COPD (chronic obstructive pulmonary disease) ([place])   . 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 Eczema   . Enlarged prostate   . High cholesterol   . Hypertension   . Loss of balance    due to meningitis  . Meningitis   . Neurogenic bladder    History of  . Numbness    in feet due to meningitis   . Rotator cuff tear    right  . Skin cancer   . Stroke [facility])    mini strokes in hospital with meningitis  . Unable to walk    due to meningitis    Past Surgical History:  Procedure Laterality Date  . APPENDECTOMY    . COCHLEAR IMPLANT    . CYSTOSCOPY WITH LITHOLAPAXY N/A 05/14/2014   Procedure: CYSTOSCOPY WITH LITHOLAPAXY;  Surgeon: Ricardo Clines, MD;  Location: [facility];  Service: Urology;  Laterality: N/A;  . CYSTOSCOPY WITH LITHOLAPAXY N/A 04/22/2015   Procedure: CYSTOSCOPY WITH LITHOLAPAXY;  Surgeon: Ricardo Clines, MD;  Location: [facility];  Service: Urology;  Laterality: N/A;  . INSERTION OF SUPRAPUBIC CATHETER N/A 05/14/2014   Procedure: INSERTION OF SUPRAPUBIC CATHETER;  Surgeon: Ricardo Clines, MD;  Location: [facility];  Service: Urology;  Laterality: N/A;  . INSERTION OF SUPRAPUBIC CATHETER N/A 04/22/2015   Procedure: INSERTION OF SUPRAPUBIC CATHETER;  Surgeon: Ricardo Clines, MD;  Location: [facility];  Service: Urology;  Laterality: N/A;  . PROSTATE SURGERY     biopsy x 2  . TESTICLE REMOVAL Left    October 1956  . TONSILLECTOMY     childhood    Family History  Problem Relation Age of Onset  . Stroke Mother   . Heart failure Father    Family Status   Relation Status  . Mother Deceased at age 11  . Father Deceased at age 31  . Daughter Alive  . Son Alive    Social History   Social History  . Marital status: Widowed    Spouse name: N/A  . Number of children: N/A  . Years of education: N/A   Occupational History  . Not  on file.   Social History Main Topics  . Smoking status: Former Smoker    Years: 10.00    Types: Cigarettes    Start date: 02/10/1955    Quit date: 02/09/1965  . Smokeless tobacco: Never Used  . Alcohol use No  . Drug use: No  . Sexual activity: No   Other Topics Concern  . Not on file   Social History Narrative   Diet- Fairly balanced   Caffeine- Yes, Coffee   Married- 1964, Chance living now with daughter, all on the first floor   Pets-4, dog and Radiation protection practitioner, English as a second language teacher for [employer], Chief Financial Officer, Freight forwarder   Exercise- Yes, Rehab 3 times a week   Living will- Yes   DNR- Yes   POA/HPOA- Yes          Allergies  Allergen Reactions  . Peanut Oil Shortness Of Breath, Itching and Swelling    Swelling of the throat   . Peanut-Containing Drug Products Shortness Of Breath and Swelling    Swelling of the throat     Allergies as of 09/25/2016      Reactions   Peanut Oil Shortness Of Breath, Itching, Swelling   Swelling of the throat    Peanut-containing Drug Products Shortness Of Breath, Swelling   Swelling of the throat       Medication List       Accurate as of 09/25/16  9:56 AM. Always use your most recent med list.          Acidophilus 100 MG Caps Take 100 mg by mouth every morning.   amLODipine 5 MG tablet Commonly known as:  NORVASC TAKE 1 TABLET BY MOUTH EVERY DAY   aspirin 81 MG chewable tablet Take one tablet by mouth once daily   atorvastatin 10 MG tablet Commonly known as:  LIPITOR TAKE 1 TABLET (10 MG TOTAL) BY MOUTH AT BEDTIME.   BREO ELLIPTA 100-25 MCG/INH Aepb Generic drug:  fluticasone furoate-vilanterol INHALE 1 PUFF INTO THE  LUNGS DAILY.   cephALEXin 500 MG capsule Commonly known as:  KEFLEX Take 2 capsules (1,000 mg total) by mouth 2 (two) times daily.   citalopram 20 MG tablet Commonly known as:  CELEXA TAKE ONE TABLET BY MOUTH ONCE DAILY FOR ANXIETY   losartan 25 MG tablet Commonly known as:  COZAAR TAKE 1 TABLET BY MOUTH ONCE DAILY TO CONTROL BLOOD PRESSURE   magnesium hydroxide 400 MG/5ML suspension Commonly known as:  MILK OF MAGNESIA Take 15 mLs by mouth daily as needed for mild constipation.   Melatonin 10 MG Tabs Take 10 mg by mouth at bedtime.   neomycin-polymyxin B 40-200000 irrigation solution Commonly known as:  NEOSPORIN Irrigate with 15 mLs as directed 1 day or 1 dose.   pantoprazole 40 MG tablet Commonly known as:  PROTONIX TAKE ONE TABLET BY MOUTH ONCE DAILY FOR STOMACH   sodium chloride irrigation 0.9 % irrigation Bladder wash   traZODone 50 MG tablet Commonly known as:  DESYREL TAKE 1 TABLET BY MOUTH AT BEDTIME *NEEDS APPT   triamcinolone cream 0.1 % Commonly known as:  KENALOG Apply 1 application topically daily.        Review of Systems:  Review of Systems  HENT: Positive for postnasal drip.   Eyes: Positive for visual disturbance.  Genitourinary: Positive for difficulty urinating, discharge and genital sores.  All other systems reviewed and are negative.   Physical Exam: Vitals:   09/25/16 0949  BP: 122/60  Pulse: 71  Temp: 98.2 F (36.8 C)  TempSrc: Oral  Weight: 186 lb (84.4 kg)  Height: 6' (1.829 m)  PF: 97 L/min   Body mass index is 25.23 kg/m. Physical Exam  Constitutional: He is oriented to person, place, and time. He appears well-developed and well-nourished. No distress.  Looks well in NAD  HENT:  Head: Normocephalic and atraumatic.  Mouth/Throat: Oropharynx is clear and moist. No oropharyngeal exudate.  Increased cerumen b/l external ear canal; MMM; no oral thrush; cobblestoning in oropharynx with no redness.  Eyes: Pupils are equal,  round, and reactive to light. No scleral icterus.  Unable to assess EOM 2/2 pt blind  Neck: Normal range of motion. Neck supple. Carotid bruit is not present. No tracheal deviation present. No thyromegaly present.  Cardiovascular: Normal rate, regular rhythm and intact distal pulses.  Exam reveals no gallop and no friction rub.   Murmur (1/6 SEM) heard. No distal LE edema. No calf TTP  Pulmonary/Chest: Effort normal and breath sounds normal. No respiratory distress. He has no wheezes. He has no rales. He exhibits no tenderness.  No rhonchi  Abdominal: Soft. Normal appearance and bowel sounds are normal. He exhibits no distension, no abdominal bruit, no pulsatile midline mass and no mass. There is no hepatosplenomegaly or hepatomegaly. There is no tenderness. There is no rigidity, no rebound and no guarding. No hernia.  Suprapubic cath intact with foley DTG  Genitourinary:  Genitourinary Comments: Deferred to urology  Musculoskeletal: He exhibits edema. He exhibits no deformity.  Lymphadenopathy:    He has no cervical adenopathy.  Neurological: He is alert and oriented to person, place, and time. He has normal reflexes.  Poor balance and unable to stand without significant assistance  Skin: Skin is warm and dry. No rash noted.  Psychiatric: He has a normal mood and affect. His behavior is normal. Judgment and thought content normal.  Vitals reviewed.   Labs reviewed:  Basic Metabolic Panel:  Recent Labs  10/11/15 1050 01/10/16 1228 03/10/16 2010  NA 137 137 131*  K 4.5 4.8 3.7  CL 101 101 99*  CO2 25 24 22   GLUCOSE 92 81 112*  BUN 18 18 21*  CREATININE 1.13 1.21* 1.18  CALCIUM 9.2 9.1 8.6*   Liver Function Tests:  Recent Labs  10/11/15 1050 01/10/16 1228 03/10/16 2010  AST 15 14 20   ALT 11 10 15*  ALKPHOS 75 70 68  BILITOT 0.5 0.4 1.0  PROT 6.7 7.1 7.1  ALBUMIN 4.2 4.2 4.2   No results for input(s): LIPASE, AMYLASE in the last 8760 hours. No results for input(s):  AMMONIA in the last 8760 hours. CBC:  Recent Labs  03/10/16 2010  WBC 28.1*  NEUTROABS 23.6*  HGB 12.4*  HCT 36.2*  MCV 85.8  PLT 234   Lipid Panel:  Recent Labs  10/11/15 1050 01/10/16 1228  CHOL 149 150  HDL 53 54  LDLCALC 64 70  TRIG 159* 131  CHOLHDL 2.8 2.8   Lab Results  Component Value Date   HGBA1C 5.7 (H) 01/10/2016    Procedures: No results found. Last ECG completed in 03/2016 showed no acute ischemic changes  Assessment/Plan   ICD-10-CM   1. Well adult exam Z00.00   2. Impaired vision in both eyes H54.3 Ambulatory referral to Ophthalmology   due to optic nerve damage in OU  3. Need for immunization against influenza Z23 Flu vaccine HIGH DOSE PF (Fluzone High dose)  4. Essential hypertension I10 CMP with  eGFR  5. Hyperglycemia R73.9 CMP with eGFR    Hemoglobin A1c  6. Generalized anxiety disorder F41.1   7. Hyperlipidemia, unspecified hyperlipidemia type E78.5 Lipid Panel    TSH  8. Suprapubic catheter ([place]) Z93.59     Maintain healthy food choices and exercise as tolerated  Recommend plain OTC claritin, allegra or zyrtec daily for post nasal drip  Continue other medications as ordered  Will call with lab results  Follow up with urology as scheduled - please discuss penile erosion with next catheter change. In meantime, use nonstick pad over area.  Influenza high dose vaccine given today  Follow up in 3 mos for HTN, hyperlipidemia, GAD  Ricardo [name]  [facility] [address] [place], [place] [address] [phone] Cell (Monday-Friday 8 AM - 5 PM) [phone] After 5 PM and follow prompts

## 2016-09-26 LAB — COMPLETE METABOLIC PANEL WITH GFR
ALBUMIN: 4.2 g/dL (ref 3.6–5.1)
ALK PHOS: 68 U/L (ref 40–115)
ALT: 13 U/L (ref 9–46)
AST: 16 U/L (ref 10–35)
BUN: 15 mg/dL (ref 7–25)
CALCIUM: 9 mg/dL (ref 8.6–10.3)
CHLORIDE: 98 mmol/L (ref 98–110)
CO2: 23 mmol/L (ref 20–32)
CREATININE: 1.04 mg/dL (ref 0.70–1.18)
GFR, Est African American: 79 mL/min (ref >=60)
GFR, Est Non African American: 68 mL/min (ref >=60)
Glucose, Bld: 100 mg/dL — ABNORMAL HIGH (ref 65–99)
POTASSIUM: 4.4 mmol/L (ref 3.5–5.3)
Sodium: 133 mmol/L — ABNORMAL LOW (ref 135–146)
Total Bilirubin: 0.4 mg/dL (ref 0.2–1.2)
Total Protein: 6.4 g/dL (ref 6.1–8.1)

## 2016-09-26 LAB — LIPID PANEL
Cholesterol: 142 mg/dL (ref <200)
HDL: 61 mg/dL (ref >40)
LDL CALC: 65 mg/dL (ref <100)
TRIGLYCERIDES: 81 mg/dL (ref <150)
Total CHOL/HDL Ratio: 2.3 Ratio (ref <5.0)
VLDL: 16 mg/dL (ref <30)

## 2016-09-26 LAB — TSH: TSH: 0.52 m[IU]/L (ref 0.40–4.50)

## 2016-09-26 LAB — HEMOGLOBIN A1C
Hgb A1c MFr Bld: 5.7 % — ABNORMAL HIGH (ref <5.7)
Mean Plasma Glucose: 117 mg/dL

## 2016-09-30 DIAGNOSIS — H26493 Other secondary cataract, bilateral: Secondary | ICD-10-CM | POA: Diagnosis not present

## 2016-09-30 DIAGNOSIS — Z961 Presence of intraocular lens: Secondary | ICD-10-CM | POA: Diagnosis not present

## 2016-09-30 DIAGNOSIS — H47293 Other optic atrophy, bilateral: Secondary | ICD-10-CM | POA: Diagnosis not present

## 2016-10-08 DIAGNOSIS — R339 Retention of urine, unspecified: Secondary | ICD-10-CM | POA: Diagnosis not present

## 2016-10-30 DIAGNOSIS — H903 Sensorineural hearing loss, bilateral: Secondary | ICD-10-CM | POA: Diagnosis not present

## 2016-10-31 DIAGNOSIS — E785 Hyperlipidemia, unspecified: Secondary | ICD-10-CM | POA: Diagnosis not present

## 2016-10-31 DIAGNOSIS — J449 Chronic obstructive pulmonary disease, unspecified: Secondary | ICD-10-CM | POA: Diagnosis not present

## 2016-10-31 DIAGNOSIS — H9191 Unspecified hearing loss, right ear: Secondary | ICD-10-CM | POA: Diagnosis not present

## 2016-10-31 DIAGNOSIS — I10 Essential (primary) hypertension: Secondary | ICD-10-CM | POA: Diagnosis not present

## 2016-10-31 DIAGNOSIS — Z87891 Personal history of nicotine dependence: Secondary | ICD-10-CM | POA: Diagnosis not present

## 2016-10-31 DIAGNOSIS — H547 Unspecified visual loss: Secondary | ICD-10-CM | POA: Diagnosis not present

## 2016-10-31 DIAGNOSIS — Z8673 Personal history of transient ischemic attack (TIA), and cerebral infarction without residual deficits: Secondary | ICD-10-CM | POA: Diagnosis not present

## 2016-10-31 DIAGNOSIS — N312 Flaccid neuropathic bladder, not elsewhere classified: Secondary | ICD-10-CM | POA: Diagnosis not present

## 2016-10-31 DIAGNOSIS — T83028A Displacement of other indwelling urethral catheter, initial encounter: Secondary | ICD-10-CM | POA: Diagnosis not present

## 2016-10-31 DIAGNOSIS — N400 Enlarged prostate without lower urinary tract symptoms: Secondary | ICD-10-CM | POA: Diagnosis not present

## 2016-10-31 DIAGNOSIS — C6290 Malignant neoplasm of unspecified testis, unspecified whether descended or undescended: Secondary | ICD-10-CM | POA: Diagnosis not present

## 2016-10-31 DIAGNOSIS — T83098A Other mechanical complication of other indwelling urethral catheter, initial encounter: Secondary | ICD-10-CM | POA: Diagnosis not present

## 2016-11-05 DIAGNOSIS — R339 Retention of urine, unspecified: Secondary | ICD-10-CM | POA: Diagnosis not present

## 2016-11-19 DIAGNOSIS — R339 Retention of urine, unspecified: Secondary | ICD-10-CM | POA: Diagnosis not present

## 2016-11-30 ENCOUNTER — Other Ambulatory Visit: Payer: Self-pay | Admitting: Internal Medicine

## 2016-12-03 ENCOUNTER — Other Ambulatory Visit: Payer: Self-pay | Admitting: Internal Medicine

## 2016-12-03 DIAGNOSIS — R339 Retention of urine, unspecified: Secondary | ICD-10-CM | POA: Diagnosis not present

## 2016-12-04 ENCOUNTER — Other Ambulatory Visit: Payer: Self-pay | Admitting: Internal Medicine

## 2016-12-11 DIAGNOSIS — N319 Neuromuscular dysfunction of bladder, unspecified: Secondary | ICD-10-CM | POA: Diagnosis not present

## 2016-12-11 DIAGNOSIS — N3020 Other chronic cystitis without hematuria: Secondary | ICD-10-CM | POA: Diagnosis not present

## 2016-12-11 DIAGNOSIS — Z9359 Other cystostomy status: Secondary | ICD-10-CM | POA: Diagnosis not present

## 2016-12-11 DIAGNOSIS — R339 Retention of urine, unspecified: Secondary | ICD-10-CM | POA: Diagnosis not present

## 2016-12-15 DIAGNOSIS — Z08 Encounter for follow-up examination after completed treatment for malignant neoplasm: Secondary | ICD-10-CM | POA: Diagnosis not present

## 2016-12-15 DIAGNOSIS — Z85828 Personal history of other malignant neoplasm of skin: Secondary | ICD-10-CM | POA: Diagnosis not present

## 2016-12-15 DIAGNOSIS — L570 Actinic keratosis: Secondary | ICD-10-CM | POA: Diagnosis not present

## 2016-12-16 ENCOUNTER — Other Ambulatory Visit: Payer: Self-pay | Admitting: Internal Medicine

## 2016-12-17 DIAGNOSIS — R339 Retention of urine, unspecified: Secondary | ICD-10-CM | POA: Diagnosis not present

## 2016-12-25 ENCOUNTER — Other Ambulatory Visit: Payer: Self-pay | Admitting: Internal Medicine

## 2016-12-29 ENCOUNTER — Ambulatory Visit: Payer: Medicare HMO | Admitting: Internal Medicine

## 2016-12-29 ENCOUNTER — Encounter: Payer: Self-pay | Admitting: Internal Medicine

## 2016-12-29 VITALS — BP 138/74 | HR 63 | Temp 98.0°F

## 2016-12-29 DIAGNOSIS — E785 Hyperlipidemia, unspecified: Secondary | ICD-10-CM | POA: Diagnosis not present

## 2016-12-29 DIAGNOSIS — I10 Essential (primary) hypertension: Secondary | ICD-10-CM | POA: Diagnosis not present

## 2016-12-29 DIAGNOSIS — Z9359 Other cystostomy status: Secondary | ICD-10-CM | POA: Diagnosis not present

## 2016-12-29 DIAGNOSIS — K5900 Constipation, unspecified: Secondary | ICD-10-CM

## 2016-12-29 NOTE — Progress Notes (Signed)
Patient ID: Ricardo [name], male   DOB: 07/05/1935, 81 y.o.   MRN: [id number]    Location:  [facility] Place of Service: OFFICE  Chief Complaint  Patient presents with  . Medical Management of Chronic Issues    3 month follow up    HPI:  81 yo male seen today for f/u. He saw urology - nothing further done for penile erosion. New irrigat Rx which is very expensive ($170) for 10 day supply. He saw Dr [name] for vision but no further tx options available at this time. He was seen by dermatology and several areas frozen on scalp. He does itch scalp often. He has restless legs and is working with PT  He is legally blind in [place]. His peripheral vision is worsening. He has optic nerve damage. He is HOH. He wears a cochlear implant on left. He has issues with hearing in loud places. 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 Followed by podiatry. Foot ulcer healed  Prediabetic- A1c 5.7%. He does not consume a lot of complex CHO  GERD sx's stable on protonix. Constipation stable on probiotic daily.   Hyperlipidemia - stable on lipitor.  LDL  HTN - BP stable on losartan, amlodipine. He takes ASA daily  Neurogenic bladder - foley cath is chronic. Takes trimethoprim for prevention. He has recurrent UTI hx and followed by Dr [name]. He had suprapubic cath exchanged on 9/30th in the ED. He is taking cranberry po daily. underwent cystourethroscopy and suprapubic cath re-insertion by Dr [name] 3/20th.    GAD/insomnia - mood stable on celexa. Takes trazodone qhs to help sleep. He also takes melatonin  COPD - stable on Breo. serevent no longer manufactured. He has increased post nasal drip since med changed. He has not tried OTC antihistamine yet   Past Medical History:  Diagnosis Date  . Anxiety   . Arthritis    neck   . 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 Cancer [facility]) 1956   left testicle  . COPD (chronic obstructive pulmonary disease) ([place])   . 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 Eczema   .  Enlarged prostate   . High cholesterol   . Hypertension   . Loss of balance    due to meningitis  . Meningitis   . Neurogenic bladder    History of  . Numbness    in feet due to meningitis   . Rotator cuff tear    right  . Skin cancer   . Stroke [facility])    mini strokes in hospital with meningitis  . Unable to walk    due to meningitis    Past Surgical History:  Procedure Laterality Date  . APPENDECTOMY    . COCHLEAR IMPLANT    . CYSTOSCOPY WITH LITHOLAPAXY N/A 05/14/2014   Procedure: CYSTOSCOPY WITH LITHOLAPAXY;  Surgeon: [name], MD;  Location: [facility];  Service: Urology;  Laterality: N/A;  . CYSTOSCOPY WITH LITHOLAPAXY N/A 04/22/2015   Procedure: CYSTOSCOPY WITH LITHOLAPAXY;  Surgeon: [name], MD;  Location: [facility];  Service: Urology;  Laterality: N/A;  . INSERTION OF SUPRAPUBIC CATHETER N/A 05/14/2014   Procedure: INSERTION OF SUPRAPUBIC CATHETER;  Surgeon: [name], MD;  Location: [facility];  Service: Urology;  Laterality: N/A;  . INSERTION OF SUPRAPUBIC CATHETER N/A 04/22/2015   Procedure: INSERTION OF SUPRAPUBIC CATHETER;  Surgeon: [name], MD;  Location: [facility];  Service: Urology;  Laterality: N/A;  . PROSTATE SURGERY  biopsy x 2  . TESTICLE REMOVAL Left    October 1956  . TONSILLECTOMY     childhood    Patient Care Team: [name], DO as PCP - General (Internal Medicine) [name], MD as Consulting Physician (Urology)  Social History   Socioeconomic History  . Marital status: Widowed    Spouse name: Not on file  . Number of children: Not on file  . Years of education: Not on file  . Highest education level: Not on file  Social Needs  . Financial resource strain: Not on file  . Food insecurity - worry: Not on file  . Food insecurity - inability: Not on file  . Transportation needs - medical: Not on file  . Transportation needs - non-medical: Not on file  Occupational History  . Not on file  Tobacco  Use  . Smoking status: Former Smoker    Years: 10.00    Types: Cigarettes    Start date: 02/10/1955    Last attempt to quit: 02/09/1965    Years since quitting: 51.9  . Smokeless tobacco: Never Used  Substance and Sexual Activity  . Alcohol use: No    Alcohol/week: 0.0 oz  . Drug use: No  . Sexual activity: No  Other Topics Concern  . Not on file  Social History Narrative   Diet- Fairly balanced   Caffeine- Yes, Coffee   Married- 1964, [place] living now with daughter, all on the first floor   Pets-4, dog and Radiation protection practitioner, English as a second language teacher for [employer], Chief Financial Officer, Freight forwarder   Exercise- Yes, Rehab 3 times a week   Living will- Yes   DNR- Yes   POA/HPOA- Yes        reports that he quit smoking about 51 years ago. His smoking use included cigarettes. He started smoking about 61 years ago. He quit after 10.00 years of use. he has never used smokeless tobacco. He reports that he does not drink alcohol or use drugs.  Family History  Problem Relation Age of Onset  . Stroke Mother   . Heart failure Father    Family Status  Relation Name Status  . Mother  Deceased at age 24  . Father  Deceased at age 48  . Daughter Little Ishikawa  . Son Tessa Lerner     Allergies  Allergen Reactions  . Peanut Oil Shortness Of Breath, Itching and Swelling    Swelling of the throat   . Peanut-Containing Drug Products Shortness Of Breath and Swelling    Swelling of the throat     Medications:   Medication List        Accurate as of 12/29/16 11:11 AM. Always use your most recent med list.          Acidophilus 100 MG Caps   amLODipine 5 MG tablet Commonly known as:  NORVASC TAKE 1 TABLET BY MOUTH EVERY DAY   aspirin 81 MG chewable tablet Take one tablet by mouth once daily   atorvastatin 10 MG tablet Commonly known as:  LIPITOR TAKE 1 TABLET (10 MG TOTAL) BY MOUTH AT BEDTIME.   BREO ELLIPTA 100-25 MCG/INH Aepb Generic drug:  fluticasone  furoate-vilanterol INHALE 1 PUFF INTO THE LUNGS DAILY.   CALCIUM 1200 PO   citalopram 20 MG tablet Commonly known as:  CELEXA TAKE ONE TABLET BY MOUTH ONCE DAILY FOR ANXIETY   losartan 25 MG tablet Commonly known as:  COZAAR TAKE 1 TABLET BY MOUTH ONCE DAILY  TO CONTROL BLOOD PRESSURE   Magnesium 500 MG Caps   magnesium hydroxide 400 MG/5ML suspension Commonly known as:  MILK OF MAGNESIA   Melatonin 10 MG Tabs   pantoprazole 40 MG tablet Commonly known as:  PROTONIX TAKE ONE TABLET BY MOUTH ONCE DAILY FOR STOMACH   sodium chloride irrigation 0.9 % irrigation   traZODone 50 MG tablet Commonly known as:  DESYREL TAKE 1 TABLET BY MOUTH EVERYDAY AT BEDTIME   triamcinolone cream 0.1 % Commonly known as:  KENALOG   UNABLE TO FIND       Review of Systems  HENT: Positive for hearing loss.   Eyes: Positive for visual disturbance.  Musculoskeletal: Positive for arthralgias and gait problem.  All other systems reviewed and are negative.   Vitals:   12/29/16 1040  BP: 138/74  Pulse: 63  Temp: 98 F (36.7 C)  TempSrc: Oral  SpO2: 97%   There is no height or weight on file to calculate BMI.  Physical Exam  Constitutional: He is oriented to person, place, and time. He appears well-developed.  Frail appearing in NAD, sitting in w/c; HOH  HENT:  Mouth/Throat: Oropharynx is clear and moist.  MMM; no oral thrush; wearing cochlear implant  Eyes: Pupils are equal, round, and reactive to light. No scleral icterus.  OU blind  Neck: Neck supple. Carotid bruit is not present.  Cardiovascular: Normal rate, regular rhythm and intact distal pulses. Exam reveals no gallop and no friction rub.  Murmur (1/6 SEM) heard. No distal LE edema. No calf TTP  Pulmonary/Chest: Effort normal and breath sounds normal. He has no wheezes. He has no rales. He exhibits no tenderness.  Abdominal: Soft. Normal appearance and bowel sounds are normal. He exhibits no distension, no abdominal bruit,  no pulsatile midline mass and no mass. There is no hepatomegaly. There is no tenderness. There is no rigidity, no rebound and no guarding. No hernia.  Genitourinary:  Genitourinary Comments: Suprapubic cath intact and DTG clear yellow urine  Musculoskeletal: He exhibits edema.  Lymphadenopathy:    He has no cervical adenopathy.  Neurological: He is alert and oriented to person, place, and time.  Skin: Skin is warm and dry. No rash noted.  Psychiatric: He has a normal mood and affect. His behavior is normal. Judgment and thought content normal.     Labs reviewed: No visits with results within 3 Month(s) from this visit.  Latest known visit with results is:  Office Visit on 09/25/2016  Component Date Value Ref Range Status  . Sodium 09/25/2016 133* 135 - 146 mmol/L Final  . Potassium 09/25/2016 4.4  3.5 - 5.3 mmol/L Final  . Chloride 09/25/2016 98  98 - 110 mmol/L Final  . CO2 09/25/2016 23  20 - 32 mmol/L Final   Comment: ** Please note change in reference range(s). **     . Glucose, Bld 09/25/2016 100* 65 - 99 mg/dL Final  . BUN 09/25/2016 15  7 - 25 mg/dL Final  . Creat 09/25/2016 1.04  0.70 - 1.18 mg/dL Final   Comment:   For patients > or = 79 years of age: The upper reference limit for Creatinine is approximately 13% higher for people identified as African-American.     . Total Bilirubin 09/25/2016 0.4  0.2 - 1.2 mg/dL Final  . Alkaline Phosphatase 09/25/2016 68  40 - 115 U/L Final  . AST 09/25/2016 16  10 - 35 U/L Final  . ALT 09/25/2016 13  9 - 46 U/L  Final  . Total Protein 09/25/2016 6.4  6.1 - 8.1 g/dL Final  . Albumin 09/25/2016 4.2  3.6 - 5.1 g/dL Final  . Calcium 09/25/2016 9.0  8.6 - 10.3 mg/dL Final  . GFR, Est African American 09/25/2016 79  >=60 mL/min Final  . GFR, Est Non African American 09/25/2016 68  >=60 mL/min Final  . Cholesterol 09/25/2016 142  <200 mg/dL Final  . Triglycerides 09/25/2016 81  <150 mg/dL Final  . HDL 09/25/2016 61  >40 mg/dL Final    . Total CHOL/HDL Ratio 09/25/2016 2.3  <5.0 Ratio Final  . VLDL 09/25/2016 16  <30 mg/dL Final  . LDL Cholesterol 09/25/2016 65  <100 mg/dL Final  . TSH 09/25/2016 0.52  0.40 - 4.50 mIU/L Final  . Hgb A1c MFr Bld 09/25/2016 5.7* <5.7 % Final   Comment:   For someone without known diabetes, a hemoglobin A1c value between 5.7% and 6.4% is consistent with prediabetes and should be confirmed with a follow-up test.   For someone with known diabetes, a value <7% indicates that their diabetes is well controlled. A1c targets should be individualized based on duration of diabetes, age, co-morbid conditions and other considerations.   This assay result is consistent with an increased risk of diabetes.   Currently, no consensus exists regarding use of hemoglobin A1c for diagnosis of diabetes in children.     . Mean Plasma Glucose 09/25/2016 117  mg/dL Final    No results found.   Assessment/Plan   ICD-10-CM   1. Constipation, unspecified constipation type K59.00   2. Essential hypertension I10   3. Hyperlipidemia, unspecified hyperlipidemia type E78.5   4. Suprapubic catheter ([place]) Z93.59    Increase prune intake to improve constipation  Continue other medications as ordered  Continue exercise routine  Follow up with specialists as scheduled  Follow up in 3 mos for HTN, constipation, neurogenic bladder, prediabetes  Gaylia Kassel S. [name]  [facility] [address] [place], [place] [address] [phone] Cell (Monday-Friday 8 AM - 5 PM) [phone] After 5 PM and follow prompts

## 2016-12-29 NOTE — Patient Instructions (Addendum)
Increase prune intake to improve constipation  Continue other medications as ordered  Continue exercise routine  Follow up with specialists as scheduled  Follow up in 3 mos for HTN, constipation, neurogenic bladder, prediabetes

## 2016-12-30 DIAGNOSIS — K5900 Constipation, unspecified: Secondary | ICD-10-CM | POA: Insufficient documentation

## 2016-12-30 DIAGNOSIS — Z9359 Other cystostomy status: Secondary | ICD-10-CM | POA: Insufficient documentation

## 2016-12-31 DIAGNOSIS — R339 Retention of urine, unspecified: Secondary | ICD-10-CM | POA: Diagnosis not present

## 2017-01-06 IMAGING — CT CT HEAD W/O CM
2 series · 15 of 30 positions shown, 19 images · non-contrast
Comparison: None.

CLINICAL DATA: 79-year-old male with altered mental status and
weakness.

EXAM:
CT HEAD WITHOUT CONTRAST
TECHNIQUE: Contiguous axial images were obtained from the base of the skull
through the vertex without intravenous contrast.

[Series 2: head w/o · axial · non-contrast · 0.45mm/px · z∈[-148,+7]mm · 13 of 37 slices shown, 17 images]
[im 3/37  brain]
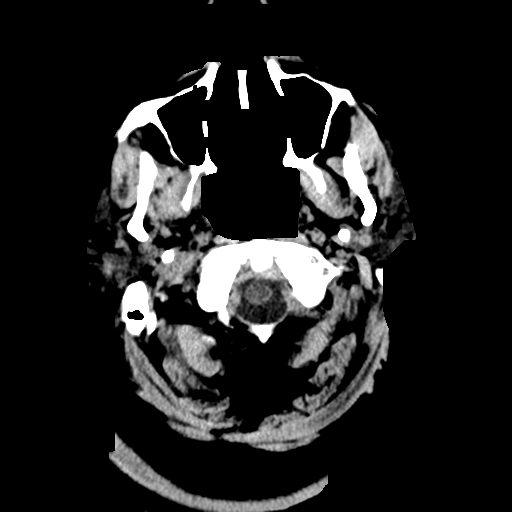
[im 3/37  bone]
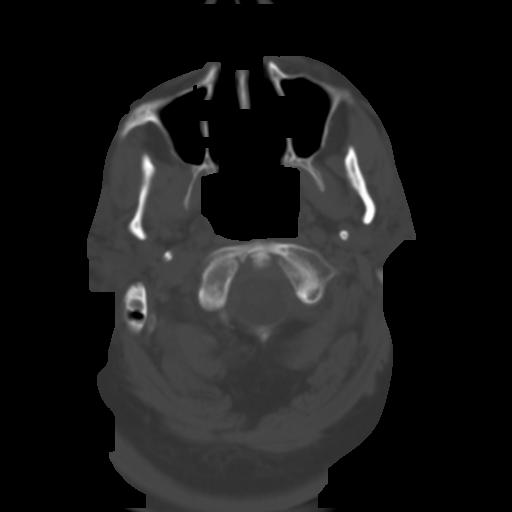
[im 6/37  brain]
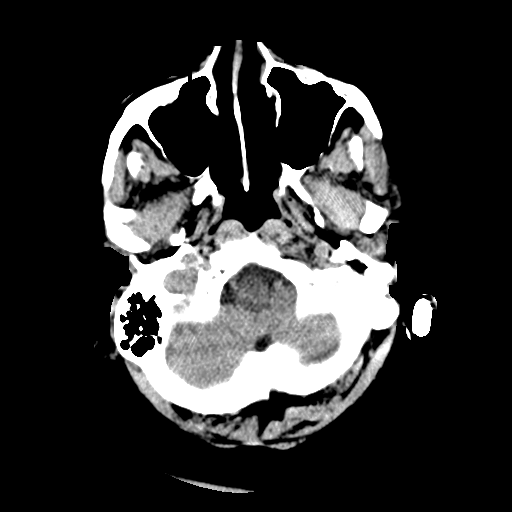
[im 8/37  brain]
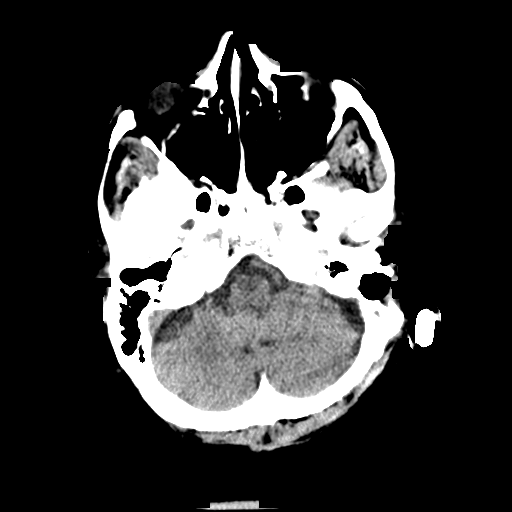
[im 11/37  brain]
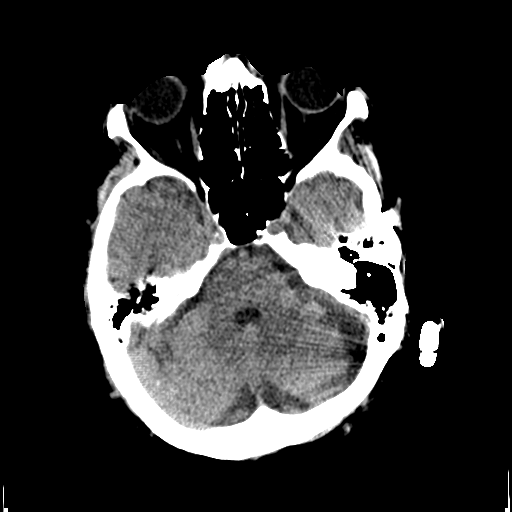
[im 13/37  brain]
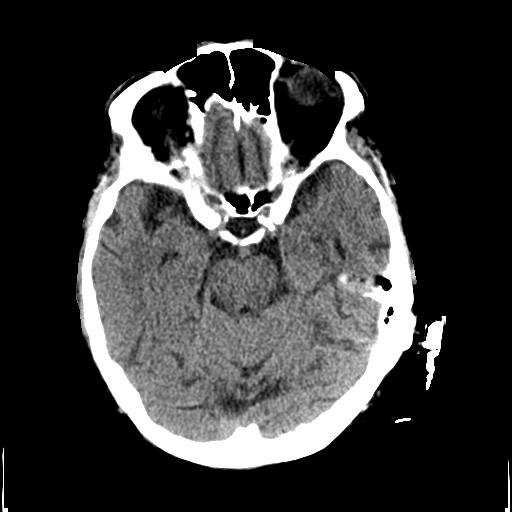
[im 13/37  bone]
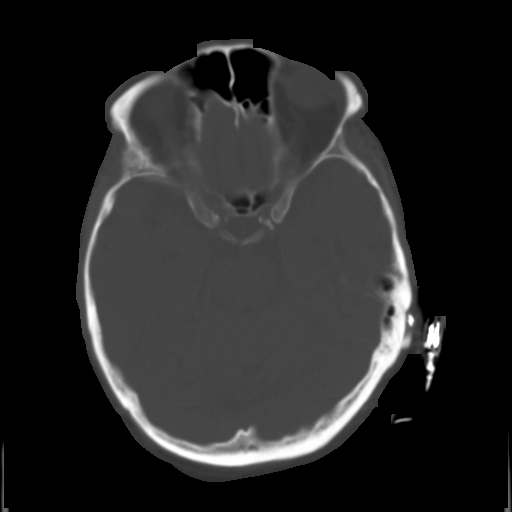
[im 16/37  brain]
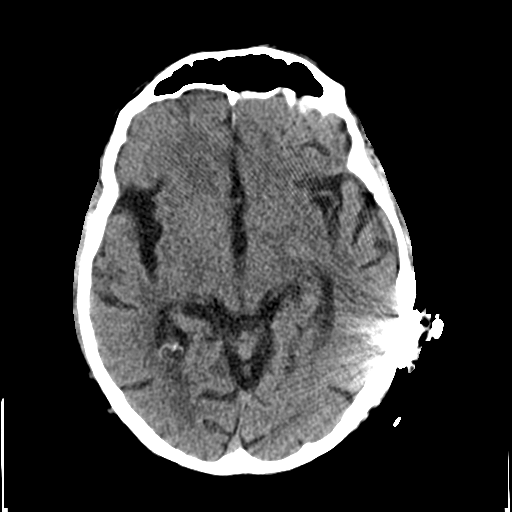
[im 19/37  brain]
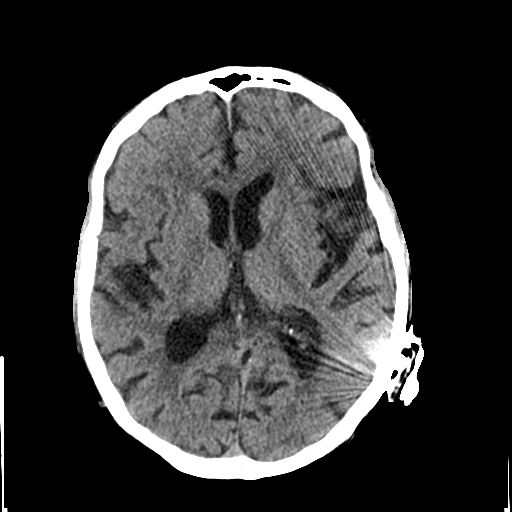
[im 21/37  brain]
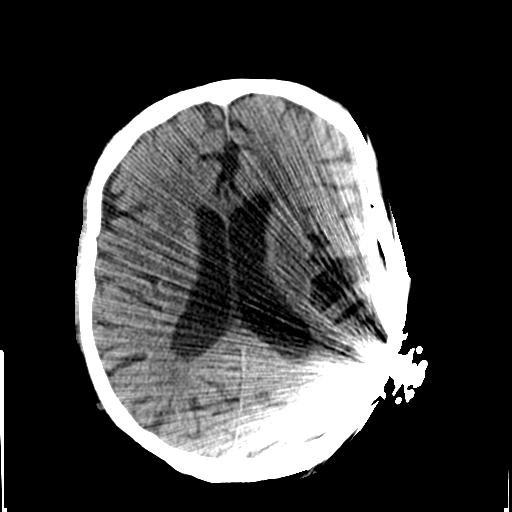
[im 24/37  brain]
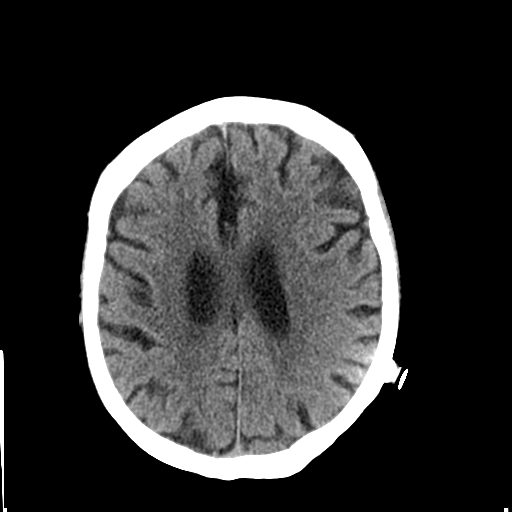
[im 24/37  bone]
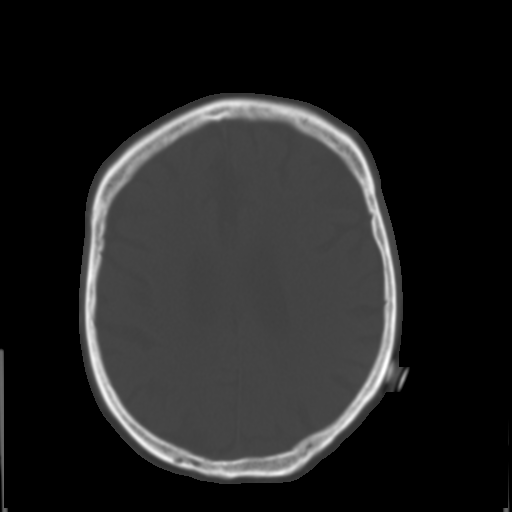
[im 26/37  brain]
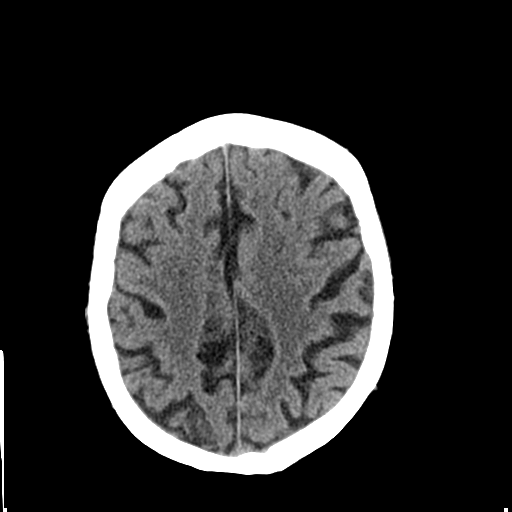
[im 29/37  brain]
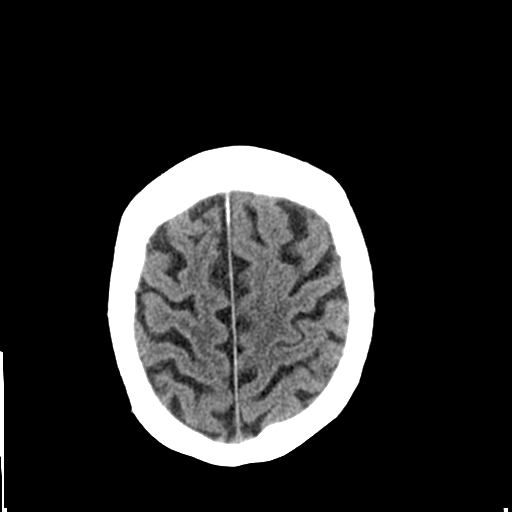
[im 31/37  brain]
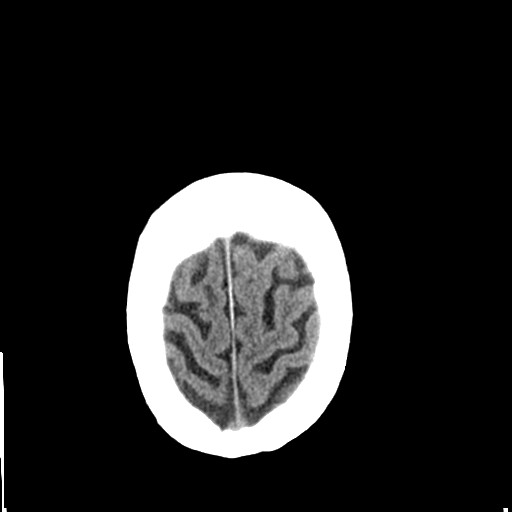
[im 34/37  brain]
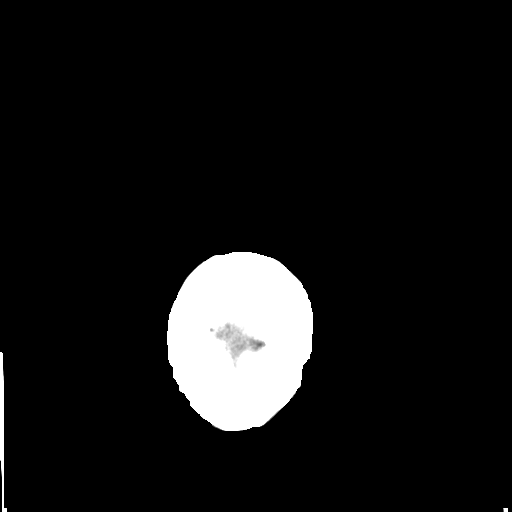
[im 34/37  bone]
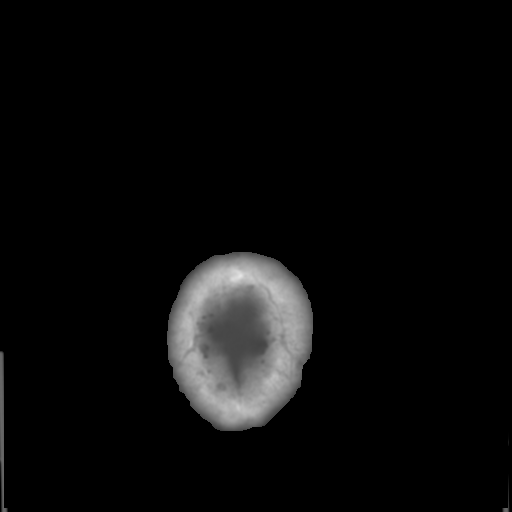

[Series 3: bone windows · axial · 0.45mm/px · z∈[-148,-123]mm · 2 of 37 slices shown]
[im 3/37  bone]
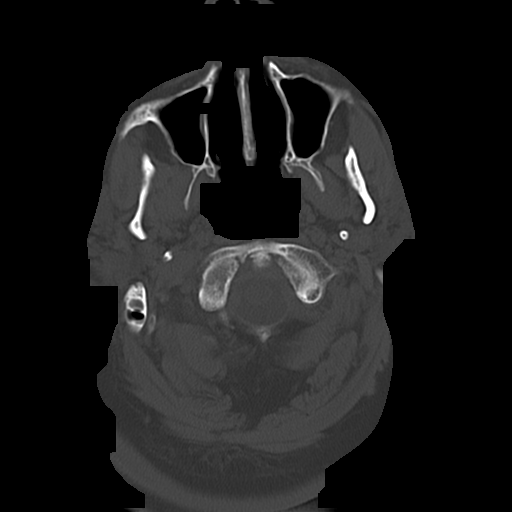
[im 8/37  bone]
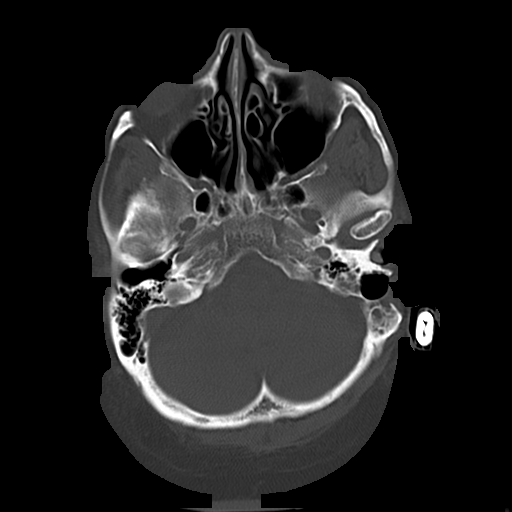

[15 of 30 positions shown; findings below may reference images not displayed]

FINDINGS: Evaluation is limited due to to streak artifact caused by metallic
hearing aid.

There is mild prominence of the ventricles and sulci compatible with
age-related atrophy. Mild to moderate periventricular and deep white
matter chronic microvascular ischemic changes noted. There is no
acute intracranial hemorrhage. No mass effect or midline shift.

There is postsurgical changes of partial left mastoid resection.
There is partial opacification of left mastoid air cells. The
remainder of the paranasal sinuses the right mastoid air cells are
well aerated. The calvarium is intact.
IMPRESSION: No acute intracranial hemorrhage.

Mild to moderate age-related atrophy and chronic microvascular
ischemic disease.

If symptoms persist and there are no contraindications, MRI may
provide better evaluation if clinically indicated

## 2017-01-14 DIAGNOSIS — R339 Retention of urine, unspecified: Secondary | ICD-10-CM | POA: Diagnosis not present

## 2017-01-29 DIAGNOSIS — Z9359 Other cystostomy status: Secondary | ICD-10-CM | POA: Diagnosis not present

## 2017-01-29 DIAGNOSIS — R339 Retention of urine, unspecified: Secondary | ICD-10-CM | POA: Diagnosis not present

## 2017-02-11 DIAGNOSIS — R339 Retention of urine, unspecified: Secondary | ICD-10-CM | POA: Diagnosis not present

## 2017-02-25 DIAGNOSIS — R339 Retention of urine, unspecified: Secondary | ICD-10-CM | POA: Diagnosis not present

## 2017-03-09 ENCOUNTER — Encounter: Payer: Self-pay | Admitting: Internal Medicine

## 2017-03-09 ENCOUNTER — Other Ambulatory Visit: Payer: Self-pay | Admitting: Internal Medicine

## 2017-03-09 ENCOUNTER — Ambulatory Visit (INDEPENDENT_AMBULATORY_CARE_PROVIDER_SITE_OTHER): Payer: Medicare HMO | Admitting: Internal Medicine

## 2017-03-09 VITALS — BP 120/60 | HR 68 | Temp 97.6°F | Resp 20 | Ht 72.0 in | Wt 185.8 lb

## 2017-03-09 DIAGNOSIS — S91109A Unspecified open wound of unspecified toe(s) without damage to nail, initial encounter: Secondary | ICD-10-CM | POA: Diagnosis not present

## 2017-03-09 DIAGNOSIS — I10 Essential (primary) hypertension: Secondary | ICD-10-CM | POA: Diagnosis not present

## 2017-03-09 DIAGNOSIS — E785 Hyperlipidemia, unspecified: Secondary | ICD-10-CM | POA: Diagnosis not present

## 2017-03-09 DIAGNOSIS — R739 Hyperglycemia, unspecified: Secondary | ICD-10-CM | POA: Diagnosis not present

## 2017-03-09 DIAGNOSIS — Z9359 Other cystostomy status: Secondary | ICD-10-CM

## 2017-03-09 DIAGNOSIS — E782 Mixed hyperlipidemia: Secondary | ICD-10-CM

## 2017-03-09 NOTE — Progress Notes (Signed)
Patient ID: Ricardo [name], male   DOB: 11/14/1935, 82 y.o.   MRN: [id number]   Location:  [facility] OFFICE  Provider: DR Arletha Grippe  Code Status:  Goals of Care:  Advanced Directives 03/09/2017  Does Patient Have a Medical Advance Directive? Yes  Type of Advance Directive [place]  Does patient want to make changes to medical advance directive? No - Patient declined  Copy of [place] in Chart? -  Would patient like information on creating a medical advance directive? -     Chief Complaint  Patient presents with  . Medical Management of Chronic Issues    podiatry referral - sore between small toe     HPI: Patient is a 82 y.o. male seen today for medical management of chronic diseases.  He noticed 2 weeks ago that between the right 4th and 5th toe, a sore has formed that is really painful when pressure applied.  He had a similar problem 32 yrs ago and a podiatrist in [place] rx silver sulfadene which was ineffective. The sore eventually healed on its own. No sweaty feet. No d/c, redness or increased warmth. He uses a piece of guaze between toes.   New irrigat Rx which is very expensive ($170) for 10 day supply. He saw Dr [name] for vision but no further tx options available at this time. He was seen by dermatology and several areas frozen on scalp. He does itch scalp often. He has restless legs and is working with PT  He is legally blind in [place]. His peripheral vision is worsening. He has optic nerve damage. He is HOH. He wears a cochlear implant on left. He has issues with hearing in loud places. Daughter is present  neuropathy of b/l foot - Followed by podiatry. Foot ulcer healed but has a new sore between right 4th and 5th digits  Prediabetic- stable. A1c 5.7%. He does not consume a lot of complex CHO  GERD sx's stable on protonix. Constipation stable on probiotic daily.   Hyperlipidemia - stable on lipitor.  LDL 65  HTN - BP stable on losartan,  amlodipine. He takes ASA daily  Neurogenic bladder - foley cath is chronic. Takes trimethoprim for prevention. He has recurrent UTI hx and followed by Dr [name]. He had suprapubic cath exchanged on 9/30th in the ED. He is taking cranberry po daily. underwent cystourethroscopy and suprapubic cath re-insertion by Dr [name] 3/20th.    GAD/insomnia - mood stable on celexa. Takes trazodone qhs to help sleep. He also takes melatonin  COPD - stable on Breo. serevent no longer manufactured. He has increased post nasal drip since med changed. He has not tried OTC antihistamine yet   Past Medical History:  Diagnosis Date  . Anxiety   . Arthritis    neck   . 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 Cancer [facility]) 1956   left testicle  . COPD (chronic obstructive pulmonary disease) ([place])   . 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 Eczema   . Enlarged prostate   . High cholesterol   . Hypertension   . Loss of balance    due to meningitis  . Meningitis   . Neurogenic bladder    History of  . Numbness    in feet due to meningitis   . Rotator cuff tear    right  . Skin cancer   . Stroke [facility])    mini strokes in hospital  with meningitis  . Unable to walk    due to meningitis    Past Surgical History:  Procedure Laterality Date  . APPENDECTOMY    . COCHLEAR IMPLANT    . CYSTOSCOPY WITH LITHOLAPAXY N/A 05/14/2014   Procedure: CYSTOSCOPY WITH LITHOLAPAXY;  Surgeon: [name], MD;  Location: [facility] ORS;  Service: Urology;  Laterality: N/A;  . CYSTOSCOPY WITH LITHOLAPAXY N/A 04/22/2015   Procedure: CYSTOSCOPY WITH LITHOLAPAXY;  Surgeon: [name], MD;  Location: [facility];  Service: Urology;  Laterality: N/A;  . INSERTION OF SUPRAPUBIC CATHETER N/A 05/14/2014   Procedure: INSERTION OF SUPRAPUBIC CATHETER;  Surgeon: [name], MD;  Location: [facility];  Service: Urology;  Laterality: N/A;  . INSERTION OF SUPRAPUBIC CATHETER N/A 04/22/2015   Procedure: INSERTION OF SUPRAPUBIC CATHETER;  Surgeon:  [name], MD;  Location: [facility];  Service: Urology;  Laterality: N/A;  . PROSTATE SURGERY     biopsy x 2  . TESTICLE REMOVAL Left    October 1956  . TONSILLECTOMY     childhood     reports that he quit smoking about 52 years ago. His smoking use included cigarettes. He started smoking about 62 years ago. He quit after 10.00 years of use. he has never used smokeless tobacco. He reports that he does not drink alcohol or use drugs. Social History   Socioeconomic History  . Marital status: Widowed    Spouse name: Not on file  . Number of children: Not on file  . Years of education: Not on file  . Highest education level: Not on file  Social Needs  . Financial resource strain: Not on file  . Food insecurity - worry: Not on file  . Food insecurity - inability: Not on file  . Transportation needs - medical: Not on file  . Transportation needs - non-medical: Not on file  Occupational History  . Not on file  Tobacco Use  . Smoking status: Former Smoker    Years: 10.00    Types: Cigarettes    Start date: 02/10/1955    Last attempt to quit: 02/09/1965    Years since quitting: 52.1  . Smokeless tobacco: Never Used  Substance and Sexual Activity  . Alcohol use: No    Alcohol/week: 0.0 oz  . Drug use: No  . Sexual activity: No  Other Topics Concern  . Not on file  Social History Narrative   Diet- Fairly balanced   Caffeine- Yes, Coffee   Married- 1964, [place] living now with daughter, all on the first floor   Pets-4, dog and Radiation protection practitioner, English as a second language teacher for [employer], Chief Financial Officer, Freight forwarder   Exercise- Yes, Rehab 3 times a week   Living will- Yes   DNR- Yes   POA/HPOA- Yes       Family History  Problem Relation Age of Onset  . Stroke Mother   . Heart failure Father     Allergies  Allergen Reactions  . Peanut Oil Shortness Of Breath, Itching and Swelling    Swelling of the throat   . Peanut-Containing Drug Products Shortness Of  Breath and Swelling    Swelling of the throat     Outpatient Encounter Medications as of 03/09/2017  Medication Sig  . amLODipine (NORVASC) 5 MG tablet TAKE 1 TABLET BY MOUTH EVERY DAY  . aspirin 81 MG chewable tablet Take one tablet by mouth once daily  . atorvastatin (LIPITOR) 10 MG tablet TAKE 1 TABLET (10 MG TOTAL) BY  MOUTH AT BEDTIME.  Marland Kitchen BREO ELLIPTA 100-25 MCG/INH AEPB INHALE 1 PUFF INTO THE LUNGS DAILY.  . Calcium Carbonate-Vit D-Min (CALCIUM 1200 PO) Take 1 tablet by mouth daily.  . citalopram (CELEXA) 20 MG tablet TAKE ONE TABLET BY MOUTH ONCE DAILY FOR ANXIETY  . Lactobacillus (ACIDOPHILUS) 100 MG CAPS Take 100 mg by mouth every morning.   Marland Kitchen losartan (COZAAR) 25 MG tablet TAKE 1 TABLET BY MOUTH ONCE DAILY TO CONTROL BLOOD PRESSURE  . Magnesium 500 MG CAPS Take 1 capsule by mouth daily.  . magnesium hydroxide (MILK OF MAGNESIA) 400 MG/5ML suspension Take 15 mLs by mouth daily as needed for mild constipation.  . Melatonin 10 MG TABS Take 10 mg by mouth at bedtime.   . pantoprazole (PROTONIX) 40 MG tablet TAKE ONE TABLET BY MOUTH ONCE DAILY FOR STOMACH  . sodium chloride irrigation 0.9 % irrigation Bladder wash  . traZODone (DESYREL) 50 MG tablet TAKE 1 TABLET BY MOUTH EVERYDAY AT BEDTIME  . triamcinolone cream (KENALOG) 0.1 % Apply 1 application topically daily.  Marland Kitchen UNABLE TO FIND Med Name: Renesidin irrigate 2 times daily.   No facility-administered encounter medications on file as of 03/09/2017.     Review of Systems:  Review of Systems  Eyes: Positive for visual disturbance.  Musculoskeletal: Positive for arthralgias.  Skin: Positive for wound.  Neurological: Positive for numbness.  All other systems reviewed and are negative.   Health Maintenance  Topic Date Due  . TETANUS/TDAP  12/29/2017 (Originally 12/30/1954)  . PNA vac Low Risk Adult (2 of 2 - PCV13) 12/29/2017 (Originally 08/15/2014)  . INFLUENZA VACCINE  Completed    Physical Exam: Vitals:   03/09/17 1130    BP: 120/60  Pulse: 68  Resp: 20  Temp: 97.6 F (36.4 C)  SpO2: 95%  Weight: 185 lb 12.8 oz (84.3 kg)  Height: 6' (1.829 m)   Body mass index is 25.2 kg/m. Physical Exam  Constitutional: He is oriented to person, place, and time. He appears well-developed and well-nourished.  HENT:  Mouth/Throat: Oropharynx is clear and moist.  MMM; no oral thrush  Eyes: Pupils are equal, round, and reactive to light. No scleral icterus.  Legally blind in [place]  Neck: Neck supple. Carotid bruit is not present. No thyromegaly present.  Cardiovascular: Normal rate, regular rhythm and intact distal pulses. Exam reveals no gallop and no friction rub.  Murmur (1/6 SEM) heard. No distal LE edema. No calf TTP  Pulmonary/Chest: Effort normal and breath sounds normal. He has no wheezes. He has no rales. He exhibits no tenderness.  Abdominal: Soft. Normal appearance and bowel sounds are normal. He exhibits no distension, no abdominal bruit, no pulsatile midline mass and no mass. There is no hepatomegaly. There is no tenderness. There is no rigidity, no rebound and no guarding. No hernia.  Suprapubic cath intact with no redness at insertion site  Genitourinary:  Genitourinary Comments: Foley DTG with sediment but no hematuria noted  Musculoskeletal: He exhibits edema and deformity.  Lymphadenopathy:    He has no cervical adenopathy.  Neurological: He is alert and oriented to person, place, and time. He has normal reflexes.  Skin: Skin is warm and dry. No rash noted.  Macerated area between 4th and 5th right toe; appears dry; no redness or d/c; (+) malodorous; right bunion and toe deformities  Psychiatric: He has a normal mood and affect. His behavior is normal. Judgment and thought content normal.    Labs reviewed: Basic Metabolic Panel: Recent Labs  03/10/16 2010 09/25/16 1050  NA 131* 133*  K 3.7 4.4  CL 99* 98  CO2 22 23  GLUCOSE 112* 100*  BUN 21* 15  CREATININE 1.18 1.04  CALCIUM 8.6* 9.0   TSH  --  0.52   Liver Function Tests: Recent Labs    03/10/16 2010 09/25/16 1050  AST 20 16  ALT 15* 13  ALKPHOS 68 68  BILITOT 1.0 0.4  PROT 7.1 6.4  ALBUMIN 4.2 4.2   No results for input(s): LIPASE, AMYLASE in the last 8760 hours. No results for input(s): AMMONIA in the last 8760 hours. CBC: Recent Labs    03/10/16 2010  WBC 28.1*  NEUTROABS 23.6*  HGB 12.4*  HCT 36.2*  MCV 85.8  PLT 234   Lipid Panel: Recent Labs    09/25/16 1050  CHOL 142  HDL 61  LDLCALC 65  TRIG 81  CHOLHDL 2.3   Lab Results  Component Value Date   HGBA1C 5.7 (H) 09/25/2016    Procedures since last visit: No results found.  Assessment/Plan   ICD-10-CM   1. Open wound of toe, initial encounter S91.109A AMB referral to wound care center   betweenn 4th and 5th toes; clean base  2. Essential hypertension I10 BMP with eGFR([facility])    CANCELED: ALT  3. Suprapubic catheter ([place]) Z93.59   4. Mixed hyperlipidemia E78.2   5. Hyperglycemia R73.9    Will call with lab results   Will call with referral to wound care center  Use dry guaze for now between the toes  Follow up in April as scheduled.   Rekita Miotke S. [name]  [facility] [address] [place], [place] [address] [phone] Cell (Monday-Friday 8 AM - 5 PM) [phone] After 5 PM and follow prompts

## 2017-03-09 NOTE — Patient Instructions (Signed)
Will call with lab results   Will call with referral to wound care center  Use dry guaze for now between the toes  Follow up in April as scheduled.

## 2017-03-10 ENCOUNTER — Encounter: Payer: Self-pay | Admitting: Internal Medicine

## 2017-03-10 LAB — BASIC METABOLIC PANEL WITH GFR
BUN: 17 mg/dL (ref 7–25)
CO2: 25 mmol/L (ref 20–32)
CREATININE: 1.09 mg/dL (ref 0.70–1.18)
Calcium: 9.2 mg/dL (ref 8.6–10.3)
Chloride: 97 mmol/L — ABNORMAL LOW (ref 98–110)
GFR, EST AFRICAN AMERICAN: 74 mL/min/{1.73_m2} (ref >=60)
GFR, EST NON AFRICAN AMERICAN: 64 mL/min/{1.73_m2} (ref >=60)
Glucose, Bld: 98 mg/dL (ref 65–139)
Potassium: 4.3 mmol/L (ref 3.5–5.3)
Sodium: 134 mmol/L — ABNORMAL LOW (ref 135–146)

## 2017-03-11 ENCOUNTER — Other Ambulatory Visit: Payer: Self-pay | Admitting: Internal Medicine

## 2017-03-18 DIAGNOSIS — R339 Retention of urine, unspecified: Secondary | ICD-10-CM | POA: Diagnosis not present

## 2017-03-22 DIAGNOSIS — G8929 Other chronic pain: Secondary | ICD-10-CM | POA: Diagnosis not present

## 2017-03-22 DIAGNOSIS — R69 Illness, unspecified: Secondary | ICD-10-CM | POA: Diagnosis not present

## 2017-03-22 DIAGNOSIS — E785 Hyperlipidemia, unspecified: Secondary | ICD-10-CM | POA: Diagnosis not present

## 2017-03-22 DIAGNOSIS — L98499 Non-pressure chronic ulcer of skin of other sites with unspecified severity: Secondary | ICD-10-CM | POA: Diagnosis not present

## 2017-03-22 DIAGNOSIS — G4700 Insomnia, unspecified: Secondary | ICD-10-CM | POA: Diagnosis not present

## 2017-03-22 DIAGNOSIS — H547 Unspecified visual loss: Secondary | ICD-10-CM | POA: Diagnosis not present

## 2017-03-22 DIAGNOSIS — I10 Essential (primary) hypertension: Secondary | ICD-10-CM | POA: Diagnosis not present

## 2017-03-22 DIAGNOSIS — J449 Chronic obstructive pulmonary disease, unspecified: Secondary | ICD-10-CM | POA: Diagnosis not present

## 2017-03-24 ENCOUNTER — Encounter (HOSPITAL_BASED_OUTPATIENT_CLINIC_OR_DEPARTMENT_OTHER): Payer: Medicare HMO

## 2017-03-27 ENCOUNTER — Other Ambulatory Visit: Payer: Self-pay | Admitting: Internal Medicine

## 2017-03-31 ENCOUNTER — Ambulatory Visit: Payer: Medicare HMO | Admitting: Internal Medicine

## 2017-04-02 ENCOUNTER — Encounter (HOSPITAL_BASED_OUTPATIENT_CLINIC_OR_DEPARTMENT_OTHER): Payer: Medicare HMO | Attending: Internal Medicine

## 2017-04-02 DIAGNOSIS — Z87891 Personal history of nicotine dependence: Secondary | ICD-10-CM | POA: Diagnosis not present

## 2017-04-02 DIAGNOSIS — Z91010 Allergy to peanuts: Secondary | ICD-10-CM | POA: Diagnosis not present

## 2017-04-02 DIAGNOSIS — G909 Disorder of the autonomic nervous system, unspecified: Secondary | ICD-10-CM | POA: Insufficient documentation

## 2017-04-02 DIAGNOSIS — J449 Chronic obstructive pulmonary disease, unspecified: Secondary | ICD-10-CM | POA: Insufficient documentation

## 2017-04-02 DIAGNOSIS — L97512 Non-pressure chronic ulcer of other part of right foot with fat layer exposed: Secondary | ICD-10-CM | POA: Insufficient documentation

## 2017-04-02 DIAGNOSIS — I10 Essential (primary) hypertension: Secondary | ICD-10-CM | POA: Insufficient documentation

## 2017-04-02 DIAGNOSIS — Z9621 Cochlear implant status: Secondary | ICD-10-CM | POA: Diagnosis not present

## 2017-04-02 DIAGNOSIS — S91104A Unspecified open wound of right lesser toe(s) without damage to nail, initial encounter: Secondary | ICD-10-CM | POA: Diagnosis not present

## 2017-04-08 DIAGNOSIS — N319 Neuromuscular dysfunction of bladder, unspecified: Secondary | ICD-10-CM | POA: Diagnosis not present

## 2017-04-08 DIAGNOSIS — Z9359 Other cystostomy status: Secondary | ICD-10-CM | POA: Diagnosis not present

## 2017-04-08 DIAGNOSIS — R339 Retention of urine, unspecified: Secondary | ICD-10-CM | POA: Diagnosis not present

## 2017-04-12 DIAGNOSIS — Z87891 Personal history of nicotine dependence: Secondary | ICD-10-CM | POA: Diagnosis not present

## 2017-04-12 DIAGNOSIS — S91104A Unspecified open wound of right lesser toe(s) without damage to nail, initial encounter: Secondary | ICD-10-CM | POA: Diagnosis not present

## 2017-04-12 DIAGNOSIS — I10 Essential (primary) hypertension: Secondary | ICD-10-CM | POA: Diagnosis not present

## 2017-04-12 DIAGNOSIS — Z91010 Allergy to peanuts: Secondary | ICD-10-CM | POA: Diagnosis not present

## 2017-04-12 DIAGNOSIS — J449 Chronic obstructive pulmonary disease, unspecified: Secondary | ICD-10-CM | POA: Diagnosis not present

## 2017-04-12 DIAGNOSIS — L97512 Non-pressure chronic ulcer of other part of right foot with fat layer exposed: Secondary | ICD-10-CM | POA: Diagnosis not present

## 2017-04-12 DIAGNOSIS — G909 Disorder of the autonomic nervous system, unspecified: Secondary | ICD-10-CM | POA: Diagnosis not present

## 2017-04-12 DIAGNOSIS — Z9621 Cochlear implant status: Secondary | ICD-10-CM | POA: Diagnosis not present

## 2017-04-20 DIAGNOSIS — I10 Essential (primary) hypertension: Secondary | ICD-10-CM | POA: Diagnosis not present

## 2017-04-20 DIAGNOSIS — L97512 Non-pressure chronic ulcer of other part of right foot with fat layer exposed: Secondary | ICD-10-CM | POA: Diagnosis not present

## 2017-04-20 DIAGNOSIS — Z9621 Cochlear implant status: Secondary | ICD-10-CM | POA: Diagnosis not present

## 2017-04-20 DIAGNOSIS — J449 Chronic obstructive pulmonary disease, unspecified: Secondary | ICD-10-CM | POA: Diagnosis not present

## 2017-04-20 DIAGNOSIS — Z87891 Personal history of nicotine dependence: Secondary | ICD-10-CM | POA: Diagnosis not present

## 2017-04-20 DIAGNOSIS — L97519 Non-pressure chronic ulcer of other part of right foot with unspecified severity: Secondary | ICD-10-CM | POA: Diagnosis not present

## 2017-04-20 DIAGNOSIS — Z91010 Allergy to peanuts: Secondary | ICD-10-CM | POA: Diagnosis not present

## 2017-04-20 DIAGNOSIS — G909 Disorder of the autonomic nervous system, unspecified: Secondary | ICD-10-CM | POA: Diagnosis not present

## 2017-04-29 DIAGNOSIS — R339 Retention of urine, unspecified: Secondary | ICD-10-CM | POA: Diagnosis not present

## 2017-05-14 DIAGNOSIS — R339 Retention of urine, unspecified: Secondary | ICD-10-CM | POA: Diagnosis not present

## 2017-05-14 DIAGNOSIS — Z466 Encounter for fitting and adjustment of urinary device: Secondary | ICD-10-CM | POA: Diagnosis not present

## 2017-05-25 ENCOUNTER — Encounter: Payer: Self-pay | Admitting: Internal Medicine

## 2017-05-25 ENCOUNTER — Ambulatory Visit (INDEPENDENT_AMBULATORY_CARE_PROVIDER_SITE_OTHER): Payer: Medicare HMO

## 2017-05-25 ENCOUNTER — Ambulatory Visit (INDEPENDENT_AMBULATORY_CARE_PROVIDER_SITE_OTHER): Payer: Medicare HMO | Admitting: Internal Medicine

## 2017-05-25 VITALS — BP 140/62 | Temp 98.0°F

## 2017-05-25 VITALS — BP 140/62 | HR 72 | Temp 98.0°F | Ht 72.0 in

## 2017-05-25 DIAGNOSIS — I10 Essential (primary) hypertension: Secondary | ICD-10-CM | POA: Diagnosis not present

## 2017-05-25 DIAGNOSIS — Z79899 Other long term (current) drug therapy: Secondary | ICD-10-CM | POA: Diagnosis not present

## 2017-05-25 DIAGNOSIS — E782 Mixed hyperlipidemia: Secondary | ICD-10-CM

## 2017-05-25 DIAGNOSIS — R739 Hyperglycemia, unspecified: Secondary | ICD-10-CM

## 2017-05-25 DIAGNOSIS — Z9359 Other cystostomy status: Secondary | ICD-10-CM

## 2017-05-25 DIAGNOSIS — Z0000 Encounter for general adult medical examination without abnormal findings: Secondary | ICD-10-CM | POA: Diagnosis not present

## 2017-05-25 LAB — COMPLETE METABOLIC PANEL WITH GFR
AG Ratio: 1.6 (calc) (ref 1.0–2.5)
ALBUMIN MSPROF: 4.1 g/dL (ref 3.6–5.1)
ALT: 10 U/L (ref 9–46)
AST: 13 U/L (ref 10–35)
Alkaline phosphatase (APISO): 69 U/L (ref 40–115)
BILIRUBIN TOTAL: 0.3 mg/dL (ref 0.2–1.2)
BUN: 18 mg/dL (ref 7–25)
CO2: 26 mmol/L (ref 20–32)
CREATININE: 1.06 mg/dL (ref 0.70–1.18)
Calcium: 9.1 mg/dL (ref 8.6–10.3)
Chloride: 99 mmol/L (ref 98–110)
GFR, EST AFRICAN AMERICAN: 77 mL/min/{1.73_m2} (ref >=60)
GFR, Est Non African American: 66 mL/min/{1.73_m2} (ref >=60)
GLOBULIN: 2.5 g/dL (ref 1.9–3.7)
GLUCOSE: 134 mg/dL (ref 65–139)
Potassium: 4.2 mmol/L (ref 3.5–5.3)
Sodium: 133 mmol/L — ABNORMAL LOW (ref 135–146)
TOTAL PROTEIN: 6.6 g/dL (ref 6.1–8.1)

## 2017-05-25 LAB — LIPID PANEL
CHOL/HDL RATIO: 2.5 (calc) (ref <5.0)
Cholesterol: 152 mg/dL (ref <200)
HDL: 61 mg/dL (ref >40)
LDL CHOLESTEROL (CALC): 72 mg/dL
NON-HDL CHOLESTEROL (CALC): 91 mg/dL (ref <130)
Triglycerides: 100 mg/dL (ref <150)

## 2017-05-25 MED ORDER — LOSARTAN POTASSIUM 25 MG PO TABS: ORAL_TABLET | ORAL | 1 refills | Status: DC

## 2017-05-25 MED ORDER — ZOSTER VAC RECOMB ADJUVANTED 50 MCG/0.5ML IM SUSR: 0.5000 mL | Freq: Once | INTRAMUSCULAR | 1 refills | Status: AC

## 2017-05-25 MED ORDER — ATORVASTATIN CALCIUM 10 MG PO TABS: ORAL_TABLET | ORAL | 1 refills | Status: DC

## 2017-05-25 MED ORDER — AMLODIPINE BESYLATE 5 MG PO TABS: 5.0000 mg | ORAL_TABLET | Freq: Every day | ORAL | 1 refills | Status: DC

## 2017-05-25 MED ORDER — FLUTICASONE FUROATE-VILANTEROL 100-25 MCG/INH IN AEPB: 1.0000 | INHALATION_SPRAY | Freq: Every day | RESPIRATORY_TRACT | 4 refills | Status: DC

## 2017-05-25 MED ORDER — PANTOPRAZOLE SODIUM 40 MG PO TBEC: DELAYED_RELEASE_TABLET | ORAL | 1 refills | Status: DC

## 2017-05-25 NOTE — Patient Instructions (Addendum)
Ricardo [name] , Thank you for taking time to come for your Medicare Wellness Visit. I appreciate your ongoing commitment to your health goals. Please review the following plan we discussed and let me know if I can assist you in the future.   Screening recommendations/referrals: Colonoscopy excluded, you are over age 80 Recommended yearly ophthalmology/optometry visit for glaucoma screening and checkup Recommended yearly dental visit for hygiene and checkup  Vaccinations: Influenza vaccine up to date, due 2019 fall season Pneumococcal vaccine possibly due- please check with insurance if he ever received Prevnar or Pneumovax Tdap vaccine due, declined Shingles vaccine due, prescription sent to pharmacy    Advanced directives: in chart  Conditions/risks identified: none  Next appointment: Tyson Dense, RN 05/30/2018 @ 10:45am  Preventive Care 54 Years and Older, Male Preventive care refers to lifestyle choices and visits with your health care provider that can promote health and wellness. What does preventive care include?  A yearly physical exam. This is also called an annual well check.  Dental exams once or twice a year.  Routine eye exams. Ask your health care provider how often you should have your eyes checked.  Personal lifestyle choices, including:  Daily care of your teeth and gums.  Regular physical activity.  Eating a healthy diet.  Avoiding tobacco and drug use.  Limiting alcohol use.  Practicing safe sex.  Taking low doses of aspirin every day.  Taking vitamin and mineral supplements as recommended by your health care provider. What happens during an annual well check? The services and screenings done by your health care provider during your annual well check will depend on your age, overall health, lifestyle risk factors, and family history of disease. Counseling  Your health care provider may ask you questions about your:  Alcohol use.  Tobacco  use.  Drug use.  Emotional well-being.  Home and relationship well-being.  Sexual activity.  Eating habits.  History of falls.  Memory and ability to understand (cognition).  Work and work Statistician. Screening  You may have the following tests or measurements:  Height, weight, and BMI.  Blood pressure.  Lipid and cholesterol levels. These may be checked every 5 years, or more frequently if you are over 65 years old.  Skin check.  Lung cancer screening. You may have this screening every year starting at age 17 if you have a 30-pack-year history of smoking and currently smoke or have quit within the past 15 years.  Fecal occult blood test (FOBT) of the stool. You may have this test every year starting at age 15.  Flexible sigmoidoscopy or colonoscopy. You may have a sigmoidoscopy every 5 years or a colonoscopy every 10 years starting at age 26.  Prostate cancer screening. Recommendations will vary depending on your family history and other risks.  Hepatitis C blood test.  Hepatitis B blood test.  Sexually transmitted disease (STD) testing.  Diabetes screening. This is done by checking your blood sugar (glucose) after you have not eaten for a while (fasting). You may have this done every 1-3 years.  Abdominal aortic aneurysm (AAA) screening. You may need this if you are a current or former smoker.  Osteoporosis. You may be screened starting at age 78 if you are at high risk. Talk with your health care provider about your test results, treatment options, and if necessary, the need for more tests. Vaccines  Your health care provider may recommend certain vaccines, such as:  Influenza vaccine. This is recommended every year.  Tetanus,  diphtheria, and acellular pertussis (Tdap, Td) vaccine. You may need a Td booster every 10 years.  Zoster vaccine. You may need this after age 37.  Pneumococcal 13-valent conjugate (PCV13) vaccine. One dose is recommended after age  61.  Pneumococcal polysaccharide (PPSV23) vaccine. One dose is recommended after age 79. Talk to your health care provider about which screenings and vaccines you need and how often you need them. This information is not intended to replace advice given to you by your health care provider. Make sure you discuss any questions you have with your health care provider. Document Released: 02/15/2015 Document Revised: 10/09/2015 Document Reviewed: 11/20/2014 Elsevier Interactive Patient Education  2017 [place] Prevention in the Home Falls can cause injuries. They can happen to people of all ages. There are many things you can do to make your home safe and to help prevent falls. What can I do on the outside of my home?  Regularly fix the edges of walkways and driveways and fix any cracks.  Remove anything that might make you trip as you walk through a door, such as a raised step or threshold.  Trim any bushes or trees on the path to your home.  Use bright outdoor lighting.  Clear any walking paths of anything that might make someone trip, such as rocks or tools.  Regularly check to see if handrails are loose or broken. Make sure that both sides of any steps have handrails.  Any raised decks and porches should have guardrails on the edges.  Have any leaves, snow, or ice cleared regularly.  Use sand or salt on walking paths during winter.  Clean up any spills in your garage right away. This includes oil or grease spills. What can I do in the bathroom?  Use night lights.  Install grab bars by the toilet and in the tub and shower. Do not use towel bars as grab bars.  Use non-skid mats or decals in the tub or shower.  If you need to sit down in the shower, use a plastic, non-slip stool.  Keep the floor dry. Clean up any water that spills on the floor as soon as it happens.  Remove soap buildup in the tub or shower regularly.  Attach bath mats securely with double-sided  non-slip rug tape.  Do not have throw rugs and other things on the floor that can make you trip. What can I do in the bedroom?  Use night lights.  Make sure that you have a light by your bed that is easy to reach.  Do not use any sheets or blankets that are too big for your bed. They should not hang down onto the floor.  Have a firm chair that has side arms. You can use this for support while you get dressed.  Do not have throw rugs and other things on the floor that can make you trip. What can I do in the kitchen?  Clean up any spills right away.  Avoid walking on wet floors.  Keep items that you use a lot in easy-to-reach places.  If you need to reach something above you, use a strong step stool that has a grab bar.  Keep electrical cords out of the way.  Do not use floor polish or wax that makes floors slippery. If you must use wax, use non-skid floor wax.  Do not have throw rugs and other things on the floor that can make you trip. What can I do with  my stairs?  Do not leave any items on the stairs.  Make sure that there are handrails on both sides of the stairs and use them. Fix handrails that are broken or loose. Make sure that handrails are as long as the stairways.  Check any carpeting to make sure that it is firmly attached to the stairs. Fix any carpet that is loose or worn.  Avoid having throw rugs at the top or bottom of the stairs. If you do have throw rugs, attach them to the floor with carpet tape.  Make sure that you have a light switch at the top of the stairs and the bottom of the stairs. If you do not have them, ask someone to add them for you. What else can I do to help prevent falls?  Wear shoes that:  Do not have high heels.  Have rubber bottoms.  Are comfortable and fit you well.  Are closed at the toe. Do not wear sandals.  If you use a stepladder:  Make sure that it is fully opened. Do not climb a closed stepladder.  Make sure that both  sides of the stepladder are locked into place.  Ask someone to hold it for you, if possible.  Clearly mark and make sure that you can see:  Any grab bars or handrails.  First and last steps.  Where the edge of each step is.  Use tools that help you move around (mobility aids) if they are needed. These include:  Canes.  Walkers.  Scooters.  Crutches.  Turn on the lights when you go into a dark area. Replace any light bulbs as soon as they burn out.  Set up your furniture so you have a clear path. Avoid moving your furniture around.  If any of your floors are uneven, fix them.  If there are any pets around you, be aware of where they are.  Review your medicines with your doctor. Some medicines can make you feel dizzy. This can increase your chance of falling. Ask your doctor what other things that you can do to help prevent falls. This information is not intended to replace advice given to you by your health care provider. Make sure you discuss any questions you have with your health care provider. Document Released: 11/15/2008 Document Revised: 06/27/2015 Document Reviewed: 02/23/2014 Elsevier Interactive Patient Education  2017 [employer].

## 2017-05-25 NOTE — Progress Notes (Signed)
Patient ID: Ricardo [name], male   DOB: Sep 15, 1935, 82 y.o.   MRN: [id number]   Location:  [facility] OFFICE  Provider: DR Arletha Grippe  Code Status:  Goals of Care:  Advanced Directives 05/25/2017  Does Patient Have a Medical Advance Directive? Yes  Type of Paramedic of [place];Living will  Does patient want to make changes to medical advance directive? No - Patient declined  Copy of [place] in Chart? Yes  Would patient like information on creating a medical advance directive? -     Chief Complaint  Patient presents with  . Medical Management of Chronic Issues    3 mnth HTN, Constipation, Neurogenic Bladder & Prediabetes; AWV 05/25/17 MMSE 20/30, Did not pass clock due to vision loss  . Medication Refill    No Refills  . Advance Care Planning    HCPOA    HPI: Patient is a 82 y.o. male seen today for medical management of chronic diseases.  He feels well overall. No issues with urine since he started new expensive irrigation. He is looking forward to trip to [place] this summer. Foot wound healed after close follow up with wound clinic Dr Omar Person irrigat Rx which is very expensive ($170) for 10 day supply. He saw Dr [name] for vision but no further tx options available at this time. He was seen by dermatology and several areas frozen on scalp. He does itch scalp often. He has restless legs and is working with PT  He is legally blind in [place]. His peripheral vision is worsening. He has optic nerve damage. He is HOH. He wears a cochlear implant on left. He has issues with hearing in loud places. Daughter is present  neuropathy of b/l foot with hyperglycemia - Followed by podiatry. Foot ulcer healed   Prediabetic- stable. A1c 5.7%. He does not consume a lot of complex CHO  GERD - stable on protonix. Constipation stable on probiotic daily.   Hyperlipidemia - stable on lipitor.  LDL 65  HTN - BP stable on losartan, amlodipine. He takes ASA  daily  Neurogenic bladder - foley cath is chronic. Takes trimethoprim for prevention. He has recurrent UTI hx and followed by Dr [name]. He had suprapubic cath exchanged on 9/30th in the ED. He is taking cranberry po daily. underwent cystourethroscopy and suprapubic cath re-insertion by Dr [name] 3/20th.    GAD/insomnia - mood stable on celexa. Takes trazodone qhs to help sleep. He also takes melatonin  COPD - stable on Breo. serevent no longer manufactured. He has increased post nasal drip since med changed. He does not take an antihistamine     Past Medical History:  Diagnosis Date  . Anxiety   . Arthritis    neck   . 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 Cancer [facility]) 1956   left testicle  . COPD (chronic obstructive pulmonary disease) ([place])   . 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 Eczema   . Enlarged prostate   . High cholesterol   . Hypertension   . Loss of balance    due to meningitis  . Meningitis   . Neurogenic bladder    History of  . Numbness    in feet due to meningitis   . Rotator cuff tear    right  . Skin cancer   . Stroke [facility])    mini strokes in hospital with meningitis  . Unable to walk  due to meningitis    Past Surgical History:  Procedure Laterality Date  . APPENDECTOMY    . CATARACT EXTRACTION Bilateral 03/05/2014  . COCHLEAR IMPLANT    . CYSTOSCOPY WITH LITHOLAPAXY N/A 05/14/2014   Procedure: CYSTOSCOPY WITH LITHOLAPAXY;  Surgeon: [name], MD;  Location: [facility];  Service: Urology;  Laterality: N/A;  . CYSTOSCOPY WITH LITHOLAPAXY N/A 04/22/2015   Procedure: CYSTOSCOPY WITH LITHOLAPAXY;  Surgeon: [name], MD;  Location: [facility];  Service: Urology;  Laterality: N/A;  . INSERTION OF SUPRAPUBIC CATHETER N/A 05/14/2014   Procedure: INSERTION OF SUPRAPUBIC CATHETER;  Surgeon: [name], MD;  [facility];  Service: Urology;  Laterality: N/A;  . INSERTION OF SUPRAPUBIC CATHETER N/A 04/22/2015   Procedure: INSERTION OF SUPRAPUBIC  CATHETER;  Surgeon: [name], MD;  [facility];  Service: Urology;  Laterality: N/A;  . PROSTATE SURGERY     biopsy x 2  . TESTICLE REMOVAL Left    October 1956  . TONSILLECTOMY     childhood     reports that he quit smoking about 52 years ago. His smoking use included cigarettes. He started smoking about 62 years ago. He quit after 10.00 years of use. He has never used smokeless tobacco. He reports that he does not drink alcohol or use drugs. Social History   Socioeconomic History  . Marital status: Widowed    Spouse name: Not on file  . Number of children: Not on file  . Years of education: Not on file  . Highest education level: Not on file  Occupational History  . Not on file  Social Needs  . Financial resource strain: Not hard at all  . Food insecurity:    Worry: Never true    Inability: Never true  . Transportation needs:    Medical: No    Non-medical: No  Tobacco Use  . Smoking status: Former Smoker    Years: 10.00    Types: Cigarettes    Start date: 02/10/1955    Last attempt to quit: 02/09/1965    Years since quitting: 52.3  . Smokeless tobacco: Never Used  Substance and Sexual Activity  . Alcohol use: No    Alcohol/week: 0.0 oz    Comment: Only on holiday  . Drug use: No  . Sexual activity: Never  Lifestyle  . Physical activity:    Days per week: 7 days    Minutes per session: 60 min  . Stress: Only a little  Relationships  . Social connections:    Talks on phone: More than three times a week    Gets together: More than three times a week    Attends religious service: Never    Active member of club or organization: No    Attends meetings of clubs or organizations: Never    Relationship status: Widowed  . Intimate partner violence:    Fear of current or ex partner: No    Emotionally abused: No    Physically abused: No    Forced sexual activity: No  Other Topics Concern  . Not on file  Social History Narrative   Diet- Fairly balanced     Caffeine- Yes, Coffee   Married- 1964, [place] living now with daughter, all on the first floor   Pets-4, dog and Radiation protection practitioner, English as a second language teacher for [employer], Chief Financial Officer, Freight forwarder   Exercise- Yes, Rehab 3 times a week   Living will- Yes   DNR- Yes   POA/HPOA-  Yes       Family History  Problem Relation Age of Onset  . Stroke Mother   . Heart failure Father     Allergies  Allergen Reactions  . Peanut Oil Shortness Of Breath, Itching and Swelling    Swelling of the throat   . Peanut-Containing Drug Products Shortness Of Breath and Swelling    Swelling of the throat     Outpatient Encounter Medications as of 05/25/2017  Medication Sig  . aspirin 81 MG chewable tablet Take one tablet by mouth once daily  . Calcium Carbonate-Vit D-Min (CALCIUM 1200 PO) Take 1 tablet by mouth daily.  . citalopram (CELEXA) 20 MG tablet TAKE ONE TABLET BY MOUTH ONCE DAILY FOR ANXIETY  . Lactobacillus (ACIDOPHILUS) 100 MG CAPS Take 100 mg by mouth every morning.   . Magnesium 500 MG CAPS Take 1 capsule by mouth daily.  . magnesium hydroxide (MILK OF MAGNESIA) 400 MG/5ML suspension Take 15 mLs by mouth daily as needed for mild constipation.  . Melatonin 10 MG TABS Take 10 mg by mouth at bedtime.   . sodium chloride irrigation 0.9 % irrigation Bladder wash  . traZODone (DESYREL) 50 MG tablet TAKE 1 TABLET BY MOUTH EVERYDAY AT BEDTIME  . triamcinolone cream (KENALOG) 0.1 % Apply 1 application topically daily.  Marland Kitchen UNABLE TO FIND Med Name: Renacidan irrigate 2 times daily.  . [DISCONTINUED] amLODipine (NORVASC) 5 MG tablet TAKE 1 TABLET BY MOUTH EVERY DAY  . [DISCONTINUED] atorvastatin (LIPITOR) 10 MG tablet TAKE 1 TABLET (10 MG TOTAL) BY MOUTH AT BEDTIME.  . [DISCONTINUED] BREO ELLIPTA 100-25 MCG/INH AEPB INHALE 1 PUFF INTO THE LUNGS DAILY.  . [DISCONTINUED] losartan (COZAAR) 25 MG tablet TAKE 1 TABLET BY MOUTH ONCE DAILY TO CONTROL BLOOD PRESSURE  . [DISCONTINUED]  pantoprazole (PROTONIX) 40 MG tablet TAKE ONE TABLET BY MOUTH ONCE DAILY FOR STOMACH   No facility-administered encounter medications on file as of 05/25/2017.     Review of Systems:  Review of Systems  Eyes: Positive for visual disturbance.  Genitourinary: Positive for difficulty urinating.  Musculoskeletal: Positive for arthralgias and gait problem.  All other systems reviewed and are negative.   Health Maintenance  Topic Date Due  . TETANUS/TDAP  12/29/2017 (Originally [birth year]/30/1956)  . PNA vac Low Risk Adult (2 of 2 - PCV13) 12/29/2017 (Originally 08/15/2014)  . INFLUENZA VACCINE  09/02/2017    Physical Exam: Vitals:   05/25/17 1026  BP: 140/62  Temp: 98 F (36.7 C)  TempSrc: Oral  SpO2: 95%   There is no height or weight on file to calculate BMI. Physical Exam  Constitutional: He is oriented to person, place, and time. He appears well-developed and well-nourished.  HENT:  Mouth/Throat: Oropharynx is clear and moist.  Eyes: Pupils are equal, round, and reactive to light. No scleral icterus.  Neck: Neck supple. Carotid bruit is not present. No thyromegaly present.  Cardiovascular: Normal rate, regular rhythm and intact distal pulses. Exam reveals no gallop and no friction rub.  Murmur (1/6 SEM) heard. no distal LE swelling. No calf TTP  Pulmonary/Chest: Effort normal and breath sounds normal. He has no wheezes. He has no rales. He exhibits no tenderness.  Abdominal: Soft. Bowel sounds are normal. He exhibits no distension, no abdominal bruit, no pulsatile midline mass and no mass. There is no hepatomegaly. There is no tenderness. There is no rebound and no guarding.  Genitourinary:  Genitourinary Comments: Foley cath intact and DTG yellow urine with min sediment  Lymphadenopathy:  He has no cervical adenopathy.  Neurological: He is alert and oriented to person, place, and time. He has normal reflexes.  Skin: Skin is warm and dry. No rash noted.  Psychiatric: He has a  normal mood and affect. His behavior is normal. Judgment and thought content normal.    Labs reviewed: Basic Metabolic Panel: Recent Labs    09/25/16 1050 03/09/17 1225  NA 133* 134*  K 4.4 4.3  CL 98 97*  CO2 23 25  GLUCOSE 100* 98  BUN 15 17  CREATININE 1.04 1.09  CALCIUM 9.0 9.2  TSH 0.52  --    Liver Function Tests: Recent Labs    09/25/16 1050  AST 16  ALT 13  ALKPHOS 68  BILITOT 0.4  PROT 6.4  ALBUMIN 4.2   No results for input(s): LIPASE, AMYLASE in the last 8760 hours. No results for input(s): AMMONIA in the last 8760 hours. CBC: No results for input(s): WBC, NEUTROABS, HGB, HCT, MCV, PLT in the last 8760 hours. Lipid Panel: Recent Labs    09/25/16 1050  CHOL 142  HDL 61  LDLCALC 65  TRIG 81  CHOLHDL 2.3   Lab Results  Component Value Date   HGBA1C 5.7 (H) 09/25/2016    Procedures since last visit: No results found.  Assessment/Plan   ICD-10-CM   1. Mixed hyperlipidemia E78.2 CMP with eGFR([facility])    Lipid Panel  2. Essential hypertension I10   3. Hyperglycemia R73.9 CMP with eGFR([facility])    Hemoglobin A1c  4. Suprapubic catheter ([place]) Z93.59   5. High risk medication use Z79.899 CMP with eGFR([facility])     Will call with lab results  Follow up with specialists as scheduled  Follow up in 6 mos for CPE   [facility] S. [name]  [facility] and [facility] [address] [place], [place] [address] [phone] Cell (Monday-Friday 8 AM - 5 PM) [phone] After 5 PM and follow prompts

## 2017-05-25 NOTE — Patient Instructions (Addendum)
Will call with lab results  Follow up with specialists as scheduled  Follow up in 6 mos for CPE

## 2017-05-25 NOTE — Progress Notes (Signed)
Subjective:   Ricardo [name] is a 82 y.o. male who presents for Medicare Annual/Subsequent preventive examination.  Last AWV-03/20/2016       Objective:    Vitals: BP 140/62 (BP Location: Left Arm, Patient Position: Sitting)   Pulse 72   Temp 98 F (36.7 C) (Oral)   SpO2 95%   There is no height or weight on file to calculate BMI.  Advanced Directives 05/25/2017 03/09/2017 09/25/2016 03/20/2016 03/10/2016 02/20/2016 01/10/2016  Does Patient Have a Medical Advance Directive? Yes Yes Yes Yes No No Yes  Type of Paramedic of [place];Living will Healthcare Power of [place];Living will [place]  Does patient want to make changes to medical advance directive? No - Patient declined No - Patient declined - - - - -  Copy of [place] in Chart? Yes - Yes Yes - - Yes  Would patient like information on creating a medical advance directive? - - - - - - -    Tobacco Social History   Tobacco Use  Smoking Status Former Smoker  . Years: 10.00  . Types: Cigarettes  . Start date: 02/10/1955  . Last attempt to quit: 02/09/1965  . Years since quitting: 52.3  Smokeless Tobacco Never Used     Counseling given: Not Answered   Clinical Intake:  Pre-visit preparation completed: No  Pain : No/denies pain     Nutritional Risks: None Diabetes: No  How often do you need to have someone help you when you read instructions, pamphlets, or other written materials from your doctor or pharmacy?: 1 - Never What is the last grade level you completed in school?: Master  Interpreter Needed?: No  Information entered by :: Tyson Dense RN  Past Medical History:  Diagnosis Date  . Anxiety   . Arthritis    neck   . 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 Cancer [facility]) 1956   left testicle  . COPD (chronic obstructive pulmonary disease) ([place])   . 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 Eczema   .  Enlarged prostate   . High cholesterol   . Hypertension   . Loss of balance    due to meningitis  . Meningitis   . Neurogenic bladder    History of  . Numbness    in feet due to meningitis   . Rotator cuff tear    right  . Skin cancer   . Stroke [facility])    mini strokes in hospital with meningitis  . Unable to walk    due to meningitis   Past Surgical History:  Procedure Laterality Date  . APPENDECTOMY    . CATARACT EXTRACTION Bilateral 03/05/2014  . COCHLEAR IMPLANT    . CYSTOSCOPY WITH LITHOLAPAXY N/A 05/14/2014   Procedure: CYSTOSCOPY WITH LITHOLAPAXY;  Surgeon: [name], MD;  [facility];  Service: Urology;  Laterality: N/A;  . CYSTOSCOPY WITH LITHOLAPAXY N/A 04/22/2015   Procedure: CYSTOSCOPY WITH LITHOLAPAXY;  Surgeon: [name], MD;  [facility];  Service: Urology;  Laterality: N/A;  . INSERTION OF SUPRAPUBIC CATHETER N/A 05/14/2014   Procedure: INSERTION OF SUPRAPUBIC CATHETER;  Surgeon: [name], MD;  [facility];  Service: Urology;  Laterality: N/A;  . INSERTION OF SUPRAPUBIC CATHETER N/A 04/22/2015   Procedure: INSERTION OF SUPRAPUBIC CATHETER;  Surgeon: [name], MD;  [facility];  Service: Urology;  Laterality:  N/A;  . PROSTATE SURGERY     biopsy x 2  . TESTICLE REMOVAL Left    October 1956  . TONSILLECTOMY     childhood   Family History  Problem Relation Age of Onset  . Stroke Mother   . Heart failure Father    Social History   Socioeconomic History  . Marital status: Widowed    Spouse name: Not on file  . Number of children: Not on file  . Years of education: Not on file  . Highest education level: Not on file  Occupational History  . Not on file  Social Needs  . Financial resource strain: Not hard at all  . Food insecurity:    Worry: Never true    Inability: Never true  . Transportation needs:    Medical: No    Non-medical: No  Tobacco Use  . Smoking status: Former Smoker    Years: 10.00     Types: Cigarettes    Start date: 02/10/1955    Last attempt to quit: 02/09/1965    Years since quitting: 52.3  . Smokeless tobacco: Never Used  Substance and Sexual Activity  . Alcohol use: No    Alcohol/week: 0.0 oz    Comment: Only on holiday  . Drug use: No  . Sexual activity: Never  Lifestyle  . Physical activity:    Days per week: 7 days    Minutes per session: 60 min  . Stress: Only a little  Relationships  . Social connections:    Talks on phone: More than three times a week    Gets together: More than three times a week    Attends religious service: Never    Active member of club or organization: No    Attends meetings of clubs or organizations: Never    Relationship status: Widowed  Other Topics Concern  . Not on file  Social History Narrative   Diet- Fairly balanced   Caffeine- Yes, Coffee   Married- 1964, [place] living now with daughter, all on the first floor   Pets-4, dog and Radiation protection practitioner, English as a second language teacher for [employer], Chief Financial Officer, Freight forwarder   Exercise- Yes, Rehab 3 times a week   Living will- Yes   DNR- Yes   POA/HPOA- Yes       Outpatient Encounter Medications as of 05/25/2017  Medication Sig  . amLODipine (NORVASC) 5 MG tablet Take 1 tablet (5 mg total) by mouth daily.  Marland Kitchen aspirin 81 MG chewable tablet Take one tablet by mouth once daily  . atorvastatin (LIPITOR) 10 MG tablet TAKE 1 TABLET (10 MG TOTAL) BY MOUTH AT BEDTIME.  . Calcium Carbonate-Vit D-Min (CALCIUM 1200 PO) Take 1 tablet by mouth daily.  . citalopram (CELEXA) 20 MG tablet TAKE ONE TABLET BY MOUTH ONCE DAILY FOR ANXIETY  . fluticasone furoate-vilanterol (BREO ELLIPTA) 100-25 MCG/INH AEPB Inhale 1 puff into the lungs daily.  . Lactobacillus (ACIDOPHILUS) 100 MG CAPS Take 100 mg by mouth every morning.   Marland Kitchen losartan (COZAAR) 25 MG tablet TAKE 1 TABLET BY MOUTH ONCE DAILY TO CONTROL BLOOD PRESSURE  . Magnesium 500 MG CAPS Take 1 capsule by mouth daily.  . magnesium  hydroxide (MILK OF MAGNESIA) 400 MG/5ML suspension Take 15 mLs by mouth daily as needed for mild constipation.  . Melatonin 10 MG TABS Take 10 mg by mouth at bedtime.   . pantoprazole (PROTONIX) 40 MG tablet TAKE ONE TABLET BY MOUTH ONCE DAILY FOR STOMACH  .  sodium chloride irrigation 0.9 % irrigation Bladder wash  . traZODone (DESYREL) 50 MG tablet TAKE 1 TABLET BY MOUTH EVERYDAY AT BEDTIME  . triamcinolone cream (KENALOG) 0.1 % Apply 1 application topically daily.  Marland Kitchen UNABLE TO FIND Med Name: Renacidan irrigate 2 times daily.  Marland Kitchen Zoster Vaccine Adjuvanted [facility]) injection Inject 0.5 mLs into the muscle once for 1 dose.  . [DISCONTINUED] amLODipine (NORVASC) 5 MG tablet TAKE 1 TABLET BY MOUTH EVERY DAY  . [DISCONTINUED] atorvastatin (LIPITOR) 10 MG tablet TAKE 1 TABLET (10 MG TOTAL) BY MOUTH AT BEDTIME.  . [DISCONTINUED] BREO ELLIPTA 100-25 MCG/INH AEPB INHALE 1 PUFF INTO THE LUNGS DAILY.  . [DISCONTINUED] losartan (COZAAR) 25 MG tablet TAKE 1 TABLET BY MOUTH ONCE DAILY TO CONTROL BLOOD PRESSURE  . [DISCONTINUED] pantoprazole (PROTONIX) 40 MG tablet TAKE ONE TABLET BY MOUTH ONCE DAILY FOR STOMACH  . [DISCONTINUED] Zoster Vaccine Adjuvanted [facility]) injection Inject 0.5 mLs into the muscle once.   No facility-administered encounter medications on file as of 05/25/2017.     Activities of Daily Living In your present state of health, do you have any difficulty performing the following activities: 05/25/2017  Hearing? Y  Vision? Y  Difficulty concentrating or making decisions? Y  Walking or climbing stairs? Y  Dressing or bathing? Y  Doing errands, shopping? Y  Preparing Food and eating ? N  Using the Toilet? N  In the past six months, have you accidently leaked urine? Y  Comment has catheter  Do you have problems with loss of bowel control? N  Managing your Medications? Y  Managing your Finances? Y  Housekeeping or managing your Housekeeping? Y  Some recent data might be hidden     Patient Care Team: [name], DO as PCP - General (Internal Medicine) [name], MD as Consulting Physician (Urology)   Assessment:   This is a routine wellness examination for [facility]  Exercise Activities and Dietary recommendations Current Exercise Habits: Home exercise routine, Type of exercise: Other - see comments;strength training/weights;walking, Time (Minutes): 60, Frequency (Times/Week): 7, Weekly Exercise (Minutes/Week): 420, Intensity: Mild, Exercise limited by: None identified  Goals    None      Fall Risk Fall Risk  05/25/2017 03/09/2017 12/29/2016 09/25/2016 03/20/2016  Falls in the past year? No No No No Yes  Number falls in past yr: - - - - 1  Comment - - - - Pt fell at one of his Doctor's appt.   Injury with Fall? - - - - No  Risk Factor Category  - - - - High Fall Risk  Risk for fall due to : - - - - Impaired balance/gait;Impaired mobility;Impaired vision;History of fall(s)  Follow up - - - - Falls prevention discussed   Is the patient's home free of loose throw rugs in walkways, pet beds, electrical cords, etc?   yes      Grab bars in the bathroom? yes      Handrails on the stairs?   yes      Adequate lighting?   yes  Depression Screen PHQ 2/9 Scores 05/25/2017 03/09/2017 09/25/2016 03/20/2016  PHQ - 2 Score 0 0 0 0    Cognitive Function MMSE - Mini Mental State Exam 05/25/2017 03/20/2016 10/11/2015 10/10/2014  Not completed: Unable to complete Unable to complete Unable to complete -  Orientation to time 5 - - 5  Orientation to Place 4 - - 5  Registration 3 - - 3  Attention/ Calculation 5 - - 5  Recall 2 - - 3  Language- name 2 objects 0 - - 2  Language- repeat 1 - - 1  Language- follow 3 step command 0 - - 3  Language- read & follow direction 0 - - 0  Write a sentence 0 - - 0  Copy design 0 - - 0  Total score 20 - - 27        Immunization History  Administered Date(s) Administered  . Influenza, High Dose Seasonal PF 09/25/2016  .  Influenza,inj,Quad PF,6+ Mos 10/10/2014, 10/11/2015  . Influenza-Unspecified 02/03/2012  . Pneumococcal Polysaccharide-23 08/14/2013  . Pneumococcal-Unspecified 02/02/2013  . Zoster 02/03/2011    Qualifies for Shingles Vaccine? Yes, educated and prescription sent to pharmacy  Screening Tests Health Maintenance  Topic Date Due  . TETANUS/TDAP  12/29/2017 (Originally 12/30/1954)  . PNA vac Low Risk Adult (2 of 2 - PCV13) 12/29/2017 (Originally 08/15/2014)  . INFLUENZA VACCINE  09/02/2017   Cancer Screenings: Lung: Low Dose CT Chest recommended if Age 16-80 years, 30 pack-year currently smoking OR have quit w/in 15years. Patient does not qualify. Colorectal: up to date  Additional Screenings:  Hepatitis C Screening:declined TDAP due-declined Patient's family will check past pneumonia vaccine records      Plan:    I have personally reviewed and addressed the Medicare Annual Wellness questionnaire and have noted the following in the patient's chart:  A. Medical and social history B. Use of alcohol, tobacco or illicit drugs  C. Current medications and supplements D. Functional ability and status E.  Nutritional status F.  Physical activity G. Advance directives H. List of other physicians I.  Hospitalizations, surgeries, and ER visits in previous 12 months J.  [place] to include hearing, vision, cognitive, depression L. Referrals and appointments - none  In addition, I have reviewed and discussed with patient certain preventive protocols, quality metrics, and best practice recommendations. A written personalized care plan for preventive services as well as general preventive health recommendations were provided to patient.  See attached scanned questionnaire for additional information.   Signed,   Tyson Dense, RN Nurse Health Advisor  Patient Concerns: Cramps in bilateral legs in the morning and middle of the night

## 2017-05-26 ENCOUNTER — Encounter: Payer: Self-pay | Admitting: Internal Medicine

## 2017-05-26 LAB — HEMOGLOBIN A1C
Hgb A1c MFr Bld: 5.9 % of total Hgb — ABNORMAL HIGH (ref <5.7)
Mean Plasma Glucose: 123 (calc)
eAG (mmol/L): 6.8 (calc)

## 2017-06-10 DIAGNOSIS — R339 Retention of urine, unspecified: Secondary | ICD-10-CM | POA: Diagnosis not present

## 2017-06-10 DIAGNOSIS — Z466 Encounter for fitting and adjustment of urinary device: Secondary | ICD-10-CM | POA: Diagnosis not present

## 2017-06-25 DIAGNOSIS — N138 Other obstructive and reflux uropathy: Secondary | ICD-10-CM | POA: Diagnosis not present

## 2017-06-25 DIAGNOSIS — R339 Retention of urine, unspecified: Secondary | ICD-10-CM | POA: Diagnosis not present

## 2017-06-25 DIAGNOSIS — Z9359 Other cystostomy status: Secondary | ICD-10-CM | POA: Diagnosis not present

## 2017-06-25 DIAGNOSIS — N3020 Other chronic cystitis without hematuria: Secondary | ICD-10-CM | POA: Diagnosis not present

## 2017-06-25 DIAGNOSIS — N401 Enlarged prostate with lower urinary tract symptoms: Secondary | ICD-10-CM | POA: Diagnosis not present

## 2017-06-26 ENCOUNTER — Other Ambulatory Visit: Payer: Self-pay | Admitting: Internal Medicine

## 2017-07-08 DIAGNOSIS — Z466 Encounter for fitting and adjustment of urinary device: Secondary | ICD-10-CM | POA: Diagnosis not present

## 2017-07-08 DIAGNOSIS — R339 Retention of urine, unspecified: Secondary | ICD-10-CM | POA: Diagnosis not present

## 2017-07-20 ENCOUNTER — Ambulatory Visit: Payer: Medicare (Managed Care) | Attending: Urology | Admitting: Urology

## 2017-07-20 ENCOUNTER — Encounter: Payer: Self-pay | Admitting: Urology

## 2017-07-20 VITALS — BP 128/61 | HR 72 | Temp 98.2°F | Ht 73.0 in | Wt 183.0 lb

## 2017-07-20 DIAGNOSIS — N319 Neuromuscular dysfunction of bladder, unspecified: Secondary | ICD-10-CM

## 2017-07-20 NOTE — Progress Notes (Signed)
This 82 year old patient has a history of a neurogenic bladder and has a chronic suprapubic tube in place. The patient resides in [place] and spends 2 months in the summer and the [place] area. The patient has a #20 [place] catheter that is changed every 3 weeks. His daughter reports that she flushes the tube daily with a Renicidin  solution. He is here for a referral from  [facility] for catheter changes at home while here this summer. He has used [facility] in the past. His catheter was last changed 2 weeks ago in [place]. His urine today is clear yellow.  Referral was made.  BP 128/61    Pulse 72    Temp 36.8 C (98.2 F) (Temporal)    Ht 1.854 m (6\' 1" )    Wt 83 kg (183 lb)    BMI 24.14 kg/m

## 2017-07-28 ENCOUNTER — Telehealth: Payer: Self-pay | Admitting: Urology

## 2017-07-28 DIAGNOSIS — I10 Essential (primary) hypertension: Secondary | ICD-10-CM | POA: Diagnosis not present

## 2017-07-28 DIAGNOSIS — J449 Chronic obstructive pulmonary disease, unspecified: Secondary | ICD-10-CM | POA: Diagnosis not present

## 2017-07-28 DIAGNOSIS — Z435 Encounter for attention to cystostomy: Secondary | ICD-10-CM | POA: Diagnosis not present

## 2017-07-28 DIAGNOSIS — Z7982 Long term (current) use of aspirin: Secondary | ICD-10-CM | POA: Diagnosis not present

## 2017-07-28 DIAGNOSIS — N319 Neuromuscular dysfunction of bladder, unspecified: Secondary | ICD-10-CM | POA: Diagnosis not present

## 2017-07-28 NOTE — Telephone Encounter (Signed)
Lynann Bologna with [facility] reports this patient is opened for home care today. If necessary he can be reached at [phone]

## 2017-08-19 DIAGNOSIS — N319 Neuromuscular dysfunction of bladder, unspecified: Secondary | ICD-10-CM | POA: Diagnosis not present

## 2017-08-19 DIAGNOSIS — Z435 Encounter for attention to cystostomy: Secondary | ICD-10-CM | POA: Diagnosis not present

## 2017-08-19 DIAGNOSIS — J449 Chronic obstructive pulmonary disease, unspecified: Secondary | ICD-10-CM | POA: Diagnosis not present

## 2017-08-19 DIAGNOSIS — Z7982 Long term (current) use of aspirin: Secondary | ICD-10-CM | POA: Diagnosis not present

## 2017-08-19 DIAGNOSIS — I10 Essential (primary) hypertension: Secondary | ICD-10-CM | POA: Diagnosis not present

## 2017-08-24 NOTE — Telephone Encounter (Signed)
Called Riley [name], informed of below. States she already faxed orders on 08/04/17. Asking if they can be signed and sent back to her.     Scheryl Marten, NP  You 11 minutes ago (11:49 AM)     Yes he will (Routing comment)          Staff,   Please find the orders that were faxed and give to provider to sign, than fax back to [facility]. Thanks [employer].

## 2017-08-24 NOTE — Telephone Encounter (Signed)
Riley [name]  from  [facility] is calling to speak with a nurse. She is inquiring if Dr.messing will be signing Home Care Orders for the patient.     If so, please fax orders to [phone] ATTN: laura     Please call her back to confirm at [phone].

## 2017-08-24 NOTE — Telephone Encounter (Signed)
Left message with Mickel Baas, ([facility]) to re-fax orders att: Cleda Mccreedy

## 2017-09-04 ENCOUNTER — Other Ambulatory Visit: Payer: Self-pay | Admitting: Internal Medicine

## 2017-09-08 ENCOUNTER — Encounter: Payer: Self-pay | Admitting: Gastroenterology

## 2017-09-09 DIAGNOSIS — Z435 Encounter for attention to cystostomy: Secondary | ICD-10-CM | POA: Diagnosis not present

## 2017-09-09 DIAGNOSIS — N319 Neuromuscular dysfunction of bladder, unspecified: Secondary | ICD-10-CM | POA: Diagnosis not present

## 2017-09-09 DIAGNOSIS — Z7982 Long term (current) use of aspirin: Secondary | ICD-10-CM | POA: Diagnosis not present

## 2017-09-09 DIAGNOSIS — I10 Essential (primary) hypertension: Secondary | ICD-10-CM | POA: Diagnosis not present

## 2017-09-09 DIAGNOSIS — J449 Chronic obstructive pulmonary disease, unspecified: Secondary | ICD-10-CM | POA: Diagnosis not present

## 2017-09-17 NOTE — Telephone Encounter (Signed)
Lynann Bologna is discharging patient from home care due to him leaving to go to [place] with his daughter.        Lynann Bologna can be reached at [phone]

## 2017-09-22 ENCOUNTER — Encounter: Payer: Self-pay | Admitting: Internal Medicine

## 2017-09-30 DIAGNOSIS — Z466 Encounter for fitting and adjustment of urinary device: Secondary | ICD-10-CM | POA: Diagnosis not present

## 2017-09-30 DIAGNOSIS — R339 Retention of urine, unspecified: Secondary | ICD-10-CM | POA: Diagnosis not present

## 2017-10-17 ENCOUNTER — Other Ambulatory Visit: Payer: Self-pay | Admitting: Internal Medicine

## 2017-10-21 DIAGNOSIS — Z466 Encounter for fitting and adjustment of urinary device: Secondary | ICD-10-CM | POA: Diagnosis not present

## 2017-10-21 DIAGNOSIS — R339 Retention of urine, unspecified: Secondary | ICD-10-CM | POA: Diagnosis not present

## 2017-11-10 DIAGNOSIS — H903 Sensorineural hearing loss, bilateral: Secondary | ICD-10-CM | POA: Diagnosis not present

## 2017-11-11 DIAGNOSIS — R339 Retention of urine, unspecified: Secondary | ICD-10-CM | POA: Diagnosis not present

## 2017-11-11 DIAGNOSIS — Z9359 Other cystostomy status: Secondary | ICD-10-CM | POA: Diagnosis not present

## 2017-11-11 DIAGNOSIS — Z466 Encounter for fitting and adjustment of urinary device: Secondary | ICD-10-CM | POA: Diagnosis not present

## 2017-11-24 ENCOUNTER — Encounter: Payer: Medicare HMO | Admitting: Internal Medicine

## 2017-11-26 ENCOUNTER — Encounter: Payer: Self-pay | Admitting: Internal Medicine

## 2017-11-26 ENCOUNTER — Ambulatory Visit (INDEPENDENT_AMBULATORY_CARE_PROVIDER_SITE_OTHER): Payer: Medicare HMO | Admitting: Internal Medicine

## 2017-11-26 ENCOUNTER — Other Ambulatory Visit: Payer: Self-pay | Admitting: Internal Medicine

## 2017-11-26 VITALS — BP 124/66 | HR 76 | Temp 98.0°F

## 2017-11-26 DIAGNOSIS — I10 Essential (primary) hypertension: Secondary | ICD-10-CM

## 2017-11-26 DIAGNOSIS — Z23 Encounter for immunization: Secondary | ICD-10-CM

## 2017-11-26 DIAGNOSIS — K219 Gastro-esophageal reflux disease without esophagitis: Secondary | ICD-10-CM

## 2017-11-26 DIAGNOSIS — Z79899 Other long term (current) drug therapy: Secondary | ICD-10-CM | POA: Diagnosis not present

## 2017-11-26 DIAGNOSIS — Z0000 Encounter for general adult medical examination without abnormal findings: Secondary | ICD-10-CM

## 2017-11-26 DIAGNOSIS — R739 Hyperglycemia, unspecified: Secondary | ICD-10-CM

## 2017-11-26 DIAGNOSIS — J438 Other emphysema: Secondary | ICD-10-CM | POA: Diagnosis not present

## 2017-11-26 DIAGNOSIS — Z9359 Other cystostomy status: Secondary | ICD-10-CM

## 2017-11-26 DIAGNOSIS — G609 Hereditary and idiopathic neuropathy, unspecified: Secondary | ICD-10-CM

## 2017-11-26 DIAGNOSIS — E782 Mixed hyperlipidemia: Secondary | ICD-10-CM

## 2017-11-26 DIAGNOSIS — G4700 Insomnia, unspecified: Secondary | ICD-10-CM

## 2017-11-26 DIAGNOSIS — F411 Generalized anxiety disorder: Secondary | ICD-10-CM

## 2017-11-26 MED ORDER — PANTOPRAZOLE SODIUM 40 MG PO TBEC: DELAYED_RELEASE_TABLET | ORAL | 3 refills | Status: DC

## 2017-11-26 MED ORDER — CITALOPRAM HYDROBROMIDE 20 MG PO TABS: ORAL_TABLET | ORAL | 3 refills | Status: DC

## 2017-11-26 MED ORDER — AMLODIPINE BESYLATE 5 MG PO TABS: 5.0000 mg | ORAL_TABLET | Freq: Every day | ORAL | 3 refills | Status: DC

## 2017-11-26 MED ORDER — TRAZODONE HCL 100 MG PO TABS: ORAL_TABLET | ORAL | 3 refills | Status: DC

## 2017-11-26 MED ORDER — ATORVASTATIN CALCIUM 10 MG PO TABS: ORAL_TABLET | ORAL | 3 refills | Status: DC

## 2017-11-26 MED ORDER — LOSARTAN POTASSIUM 25 MG PO TABS: ORAL_TABLET | ORAL | 3 refills | Status: DC

## 2017-11-26 MED ORDER — FLUTICASONE FUROATE-VILANTEROL 100-25 MCG/INH IN AEPB: INHALATION_SPRAY | RESPIRATORY_TRACT | 6 refills | Status: DC

## 2017-11-26 NOTE — Patient Instructions (Signed)
Flu shot given today  Will call with lab results  Follow up with specialists as scheduled  Follow up in 6 mos for routine visit with Ricardo [name]. Fasting labs prior to appt.  Keeping you healthy  Get these tests  Blood pressure- Have your blood pressure checked once a year by your healthcare provider.  Normal blood pressure is 120/80  Weight- Have your body mass index (BMI) calculated to screen for obesity.  BMI is a measure of body fat based on height and weight. You can also calculate your own BMI at ViewBanking.si.  Cholesterol- Have your cholesterol checked every year.  Diabetes- Have your blood sugar checked regularly if you have high blood pressure, high cholesterol, have a family history of diabetes or if you are overweight.  Screening for Colon Cancer- Colonoscopy starting at age 44.  Screening may begin sooner depending on your family history and other health conditions. Follow up colonoscopy as directed by your Gastroenterologist.  Screening for Prostate Cancer- Both blood work (PSA) and a rectal exam help screen for Prostate Cancer.  Screening begins at age 38 with African-American men and at age 72 with Caucasian men.  Screening may begin sooner depending on your family history.  Take these medicines  Aspirin- One aspirin daily can help prevent Heart disease and Stroke.  Flu shot- Every fall.  Tetanus- Every 10 years.  Zostavax- Once after the age of 42 to prevent Shingles.  Pneumonia shot- Once after the age of 46; if you are younger than 6, ask your healthcare provider if you need a Pneumonia shot.  Take these steps  Don't smoke- If you do smoke, talk to your doctor about quitting.  For tips on how to quit, go to www.smokefree.gov or call 1-800-QUIT-NOW.  Be physically active- Exercise 5 days a week for at least 30 minutes.  If you are not already physically active start slow and gradually work up to 30 minutes of moderate physical activity.  Examples of  moderate activity include walking briskly, mowing the yard, dancing, swimming, bicycling, etc.  Eat a healthy diet- Eat a variety of healthy food such as fruits, vegetables, low fat milk, low fat cheese, yogurt, lean meant, poultry, fish, beans, tofu, etc. For more information go to www.thenutritionsource.org  Drink alcohol in moderation- Limit alcohol intake to less than two drinks a day. Never drink and drive.  Dentist- Brush and floss twice daily; visit your dentist twice a year.  Depression- Your emotional health is as important as your physical health. If you're feeling down, or losing interest in things you would normally enjoy please talk to your healthcare provider.  Eye exam- Visit your eye doctor every year.  Safe sex- If you may be exposed to a sexually transmitted infection, use a condom.  Seat belts- Seat belts can save your life; always wear one.  Smoke/Carbon Monoxide detectors- These detectors need to be installed on the appropriate level of your home.  Replace batteries at least once a year.  Skin cancer- When out in the sun, cover up and use sunscreen 15 SPF or higher.  Violence- If anyone is threatening you, please tell your healthcare provider.  Living Will/ Health care power of attorney- Speak with your healthcare provider and family.

## 2017-11-26 NOTE — Progress Notes (Signed)
Patient ID: Ricardo [name], male   DOB: [birth year], 82 y.o.   MRN: [id number]   Location:  [facility]  Place of Service:  OFFICE  Provider: Arletha Grippe, DO  Patient Care Team: [name], DO as PCP - General (Internal Medicine) [name], MD as Consulting Physician (Urology)  Extended Emergency Contact Information Primary Emergency Contact: [name] Address: [place], [place] [address] [name] of [place] Phone: [phone] Mobile Phone: [phone] Relation: Daughter  Code Status:  Goals of Care: Advanced Directive information Advanced Directives 05/25/2017  Does Patient Have a Medical Advance Directive? Yes  Type of Paramedic of [place];Living will  Does patient want to make changes to medical advance directive? No - Patient declined  Copy of [place] in Chart? Yes  Would patient like information on creating a medical advance directive? -     Chief Complaint  Patient presents with  . Annual Exam    Yearly CPX with EKG, AWV completed in April 2019, - fall, - depression.  . Immunizations    Flu Vaccine today   . Medication Refill    Refill all medications due     HPI: Patient is a 82 y.o. male seen in today for an annual wellness exam.  AWV completed 05/2017; MMSE 20/30 and unable to complete clock drawing test due to blindness. He continues to work with PT due to restless legs. Strength is improved overall. Dermatology follows skin lesions. He noticed increased easy bruising on b/l UE. He takes ASA daily. He is c/a legs as they feel "heavy" every AM when he wakes up and is gradually worsening over the last month. He has a hx toe numbness. His leg sx's actually improve after he gets up and moves around in the AM. Of note, he has noticed more fingertip numbness also. He does not consume complex carbs often but does admit to a "small" dessert each night.  Poor vision and hearing -  He is legally blind in OU. His peripheral vision is worsening. He has optic nerve damage. Eye specialist is Dr [name]. He is HOH. He wears a cochlear implant on left. He has issues with hearing in loud places.   neuropathy of b/l foot with hyperglycemia - Followed by podiatry. Foot ulcer healed   Prediabetic- stable. A1c 5.9%. He does not consume a lot of complex CHO  GERD - stable on protonix. Constipation stable on probiotic daily.   Hyperlipidemia - stable on lipitor.  LDL 72  HTN - BP stable on losartan, amlodipine. He takes ASA daily  Neurogenic bladder/urinary retention - foley cath is chronic. Takes trimethoprim for prevention. He has recurrent UTI hx and followed by Dr [name]. He had suprapubic tube exchanged on 11/11/17. He is taking cranberry po daily. Last cystourethroscopy by Dr [name] 04/21/17. He continues to use irrigat Rx which is very expensive ($170) for 10 day supply.  GAD/insomnia - mood stable on celexa. Takes trazodone qhs to help sleep but feels dose could be a little stronger. He also takes melatonin  COPD - stable on Breo. serevent no longer manufactured. He has increased post nasal drip since med changed. He does not take an antihistamine   Depression screen [facility] 2/9 11/26/2017 05/25/2017 03/09/2017 09/25/2016 03/20/2016  Decreased Interest 0 0 0 0 0  Down, Depressed, Hopeless 0 0 0 0 0  PHQ - 2 Score 0 0 0 0 0  Fall Risk  11/26/2017 05/25/2017 03/09/2017 12/29/2016 09/25/2016  Falls in the past year? No No No No No  Number falls in past yr: - - - - -  Comment - - - - -  Injury with Fall? - - - - -  Risk Factor Category  - - - - -  Risk for fall due to : - - - - -  Follow up - - - - -   MMSE - Mini Mental State Exam 05/25/2017 03/20/2016 10/11/2015 10/10/2014  Not completed: Unable to complete Unable to complete Unable to complete -  Orientation to time 5 - - 5  Orientation to Place 4 - - 5  Registration 3 - - 3  Attention/ Calculation 5 - - 5  Recall 2 - - 3    Language- name 2 objects 0 - - 2  Language- repeat 1 - - 1  Language- follow 3 step command 0 - - 3  Language- read & follow direction 0 - - 0  Write a sentence 0 - - 0  Copy design 0 - - 0  Total score 20 - - 27     Health Maintenance  Topic Date Due  . INFLUENZA VACCINE  09/02/2017  . TETANUS/TDAP  12/29/2017 (Originally 12/30/1954)  . PNA vac Low Risk Adult (2 of 2 - PCV13) 12/29/2017 (Originally 08/15/2014)     Past Medical History:  Diagnosis Date  . Anxiety   . Arthritis    neck   . 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 Cancer [facility]) 1956   left testicle  . COPD (chronic obstructive pulmonary disease) ([place])   . 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 Eczema   . Enlarged prostate   . High cholesterol   . Hypertension   . Loss of balance    due to meningitis  . Meningitis   . Neurogenic bladder    History of  . Numbness    in feet due to meningitis   . Rotator cuff tear    right  . Skin cancer   . Stroke [facility])    mini strokes in hospital with meningitis  . Unable to walk    due to meningitis    Past Surgical History:  Procedure Laterality Date  . APPENDECTOMY    . CATARACT EXTRACTION Bilateral 03/05/2014  . COCHLEAR IMPLANT    . CYSTOSCOPY WITH LITHOLAPAXY N/A 05/14/2014   Procedure: CYSTOSCOPY WITH LITHOLAPAXY;  Surgeon: [name], MD;  Location: [facility];  Service: Urology;  Laterality: N/A;  . CYSTOSCOPY WITH LITHOLAPAXY N/A 04/22/2015   Procedure: CYSTOSCOPY WITH LITHOLAPAXY;  Surgeon: [name], MD;  Location: [facility];  Service: Urology;  Laterality: N/A;  . INSERTION OF SUPRAPUBIC CATHETER N/A 05/14/2014   Procedure: INSERTION OF SUPRAPUBIC CATHETER;  Surgeon: [name], MD;  Location: [facility];  Service: Urology;  Laterality: N/A;  . INSERTION OF SUPRAPUBIC CATHETER N/A 04/22/2015   Procedure: INSERTION OF SUPRAPUBIC CATHETER;  Surgeon: [name], MD;  Location: [facility];  Service: Urology;  Laterality: N/A;  . PROSTATE SURGERY      biopsy x 2  . TESTICLE REMOVAL Left    October 1956  . TONSILLECTOMY     childhood    Family History  Problem Relation Age of Onset  . Stroke Mother   . Heart failure Father    Family Status  Relation Name Status  . Mother  Deceased at age 30  . Father  Deceased  at age 35  . Daughter Little Ishikawa  . Son Legrand Como Alive    shoulder  Social History   Socioeconomic History  . Marital status: Widowed    Spouse name: Not on file  . Number of children: Not on file  . Years of education: Not on file  . Highest education level: Not on file  Occupational History  . Not on file  Social Needs  . Financial resource strain: Not hard at all  . Food insecurity:    Worry: Never true    Inability: Never true  . Transportation needs:    Medical: No    Non-medical: No  Tobacco Use  . Smoking status: Former Smoker    Years: 10.00    Types: Cigarettes    Start date: 02/10/1955    Last attempt to quit: 02/09/1965    Years since quitting: 52.8  . Smokeless tobacco: Never Used  Substance and Sexual Activity  . Alcohol use: No    Alcohol/week: 0.0 standard drinks    Comment: Only on holiday  . Drug use: No  . Sexual activity: Never  Lifestyle  . Physical activity:    Days per week: 7 days    Minutes per session: 60 min  . Stress: Only a little  Relationships  . Social connections:    Talks on phone: More than three times a week    Gets together: More than three times a week    Attends religious service: Never    Active member of club or organization: No    Attends meetings of clubs or organizations: Never    Relationship status: Widowed  . Intimate partner violence:    Fear of current or ex partner: No    Emotionally abused: No    Physically abused: No    Forced sexual activity: No  Other Topics Concern  . Not on file  Social History Narrative   Diet- Fairly balanced   Caffeine- Yes, Coffee   Married- 1964, [place] living now with daughter, all on the  first floor   Pets-4, dog and Radiation protection practitioner, English as a second language teacher for [employer], Chief Financial Officer, Freight forwarder   Exercise- Yes, Rehab 3 times a week   Living will- Yes   DNR- Yes   POA/HPOA- Yes       Allergies  Allergen Reactions  . Peanut Oil Shortness Of Breath, Itching and Swelling    Swelling of the throat   . Peanut-Containing Drug Products Shortness Of Breath and Swelling    Swelling of the throat     Allergies as of 11/26/2017      Reactions   Peanut Oil Shortness Of Breath, Itching, Swelling   Swelling of the throat    Peanut-containing Drug Products Shortness Of Breath, Swelling   Swelling of the throat       Medication List        Accurate as of 11/26/17  9:51 AM. Always use your most recent med list.          Acidophilus 100 MG Caps Take 100 mg by mouth every morning.   amLODipine 5 MG tablet Commonly known as:  NORVASC TAKE 1 TABLET BY MOUTH EVERY DAY   aspirin 81 MG chewable tablet Take one tablet by mouth once daily   atorvastatin 10 MG tablet Commonly known as:  LIPITOR TAKE 1 TABLET (10 MG TOTAL) BY MOUTH AT BEDTIME.   BREO ELLIPTA 100-25 MCG/INH Aepb Generic drug:  fluticasone furoate-vilanterol TAKE 1  PUFF BY MOUTH EVERY DAY   CALCIUM 1200 PO Take 1 tablet by mouth daily.   citalopram 20 MG tablet Commonly known as:  CELEXA TAKE ONE TABLET BY MOUTH ONCE DAILY FOR ANXIETY   losartan 25 MG tablet Commonly known as:  COZAAR TAKE 1 TABLET BY MOUTH ONCE DAILY TO CONTROL BLOOD PRESSURE   Magnesium 500 MG Caps Take 1 capsule by mouth daily.   magnesium hydroxide 400 MG/5ML suspension Commonly known as:  MILK OF MAGNESIA Take 15 mLs by mouth daily as needed for mild constipation.   Melatonin 10 MG Tabs Take 10 mg by mouth at bedtime.   pantoprazole 40 MG tablet Commonly known as:  PROTONIX TAKE ONE TABLET BY MOUTH ONCE DAILY FOR STOMACH   sodium chloride irrigation 0.9 % irrigation Bladder wash   traZODone 50 MG tablet Commonly  known as:  DESYREL TAKE 1 TABLET BY MOUTH EVERYDAY AT BEDTIME   triamcinolone cream 0.1 % Commonly known as:  KENALOG Apply 1 application topically daily.   UNABLE TO FIND Med Name: [place] irrigate 2 times daily.        Review of Systems:  Review of Systems  HENT: Positive for hearing loss.   Eyes: Positive for visual disturbance.  Genitourinary: Positive for difficulty urinating.  Neurological: Positive for numbness.  Hematological: Bruises/bleeds easily.  All other systems reviewed and are negative.   Physical Exam: Vitals:   11/26/17 0925  BP: 124/66  Pulse: 76  Temp: 98 F (36.7 C)  TempSrc: Oral  SpO2: 98%   There is no height or weight on file to calculate BMI. Physical Exam  Constitutional: He is oriented to person, place, and time. He appears well-developed and well-nourished. No distress.  HENT:  Head: Normocephalic and atraumatic.  Right Ear: External ear normal.  Left Ear: External ear normal.  Mouth/Throat: Uvula is midline, oropharynx is clear and moist and mucous membranes are normal. He does not have dentures.  HOH; Left cochlear implant intact  Eyes: Pupils are equal, round, and reactive to light. Conjunctivae and lids are normal. No scleral icterus.  OU blind  Neck: Trachea normal and normal range of motion. Neck supple. Carotid bruit is not present. No thyroid mass and no thyromegaly present.  Cardiovascular: Normal rate, regular rhythm and intact distal pulses. Exam reveals no gallop and no friction rub.  Murmur (1/6 SEM) heard. No LE edema b/l; no calf TTP; b/l LE varicose veins but no palpable cords  Pulmonary/Chest: Effort normal and breath sounds normal. No stridor. No respiratory distress. He has no wheezes. He has no rhonchi. He has no rales. He exhibits no mass and no tenderness. Right breast exhibits no inverted nipple, no mass, no nipple discharge, no skin change and no tenderness. Left breast exhibits no inverted nipple, no mass, no  nipple discharge, no skin change and no tenderness. Breasts are symmetrical.  Abdominal: Soft. Normal appearance, normal aorta and bowel sounds are normal. He exhibits no distension, no pulsatile midline mass and no mass. There is no hepatosplenomegaly. There is no tenderness. There is no rigidity, no rebound and no guarding. No hernia.  Genitourinary:  Genitourinary Comments: Foley intact at suprapubic cath site and DTG clear yellow urine with some sediment  Musculoskeletal: Normal range of motion. He exhibits edema (small and large joint). He exhibits no deformity.  Lymphadenopathy:       Head (right side): No posterior auricular adenopathy present.       Head (left side): No posterior auricular adenopathy present.  He has no cervical adenopathy.       Right: No supraclavicular adenopathy present.       Left: No supraclavicular adenopathy present.  Neurological: He is alert and oriented to person, place, and time. He has normal strength and normal reflexes. A sensory deficit is present. No cranial nerve deficit. Gait normal.  Decreased sensation to vibration in b/l foot  Skin: Skin is warm, dry and intact. No rash noted. Nails show no clubbing.  (+) UE purpura b/l  Psychiatric: He has a normal mood and affect. His speech is normal and behavior is normal. Judgment and thought content normal. Cognition and memory are normal.    Labs reviewed:  Basic Metabolic Panel: Recent Labs    03/09/17 1225 05/25/17 0000  NA 134* 133*  K 4.3 4.2  CL 97* 99  CO2 25 26  GLUCOSE 98 134  BUN 17 18  CREATININE 1.09 1.06  CALCIUM 9.2 9.1   Liver Function Tests: Recent Labs    05/25/17 0000  AST 13  ALT 10  BILITOT 0.3  PROT 6.6   No results for input(s): LIPASE, AMYLASE in the last 8760 hours. No results for input(s): AMMONIA in the last 8760 hours. CBC: No results for input(s): WBC, NEUTROABS, HGB, HCT, MCV, PLT in the last 8760 hours. Lipid Panel: Recent Labs    05/25/17 0000    CHOL 152  HDL 61  LDLCALC 72  TRIG 100  CHOLHDL 2.5   Lab Results  Component Value Date   HGBA1C 5.9 (H) 05/25/2017    Procedures: No results found. ECG OBTAINED AND REVIEWED BY MYSELF: 1st degree AVB @ 66 bpm, nml axis, LAE, LVH, poor R wave progression. No acute ischemic changes. No change since 03/2016  Assessment/Plan   ICD-10-CM   1. Well adult exam Z00.00   2. Insomnia, unspecified type G47.00 traZODone (DESYREL) 100 MG tablet  3. Hyperglycemia R73.9 Hemoglobin A1c  4. Idiopathic peripheral neuropathy G60.9 NCV with EMG(electromyography)  5. Essential hypertension I10 EKG 12-Lead    losartan (COZAAR) 25 MG tablet    amLODipine (NORVASC) 5 MG tablet    CBC with Differential/Platelets    CMP with eGFR([facility])  6. Mixed hyperlipidemia E78.2 atorvastatin (LIPITOR) 10 MG tablet    Lipid Panel    TSH  7. Suprapubic catheter ([place]) Z93.59   8. High risk medication use Z79.899 CMP with eGFR([facility])  9. Other emphysema (HCC) J43.8 fluticasone furoate-vilanterol (BREO ELLIPTA) 100-25 MCG/INH AEPB  10. Generalized anxiety disorder F41.1 citalopram (CELEXA) 20 MG tablet  11. Gastroesophageal reflux disease without esophagitis K21.9   12. Need for immunization against influenza Z23 Flu Vaccine QUAD 36+ mos IM   He will be turning 82 next month and plans to have a shot of bourbon then  Flu shot given today  Will call with lab results  Follow up with specialists as scheduled  Follow up in 6 mos for routine visit with Janett Billow. Fasting labs prior to appt.(cmp, lipid panel, a1c)  Keeping you healthy handout given     Cordella Register. [name]  [facility] [address] [place], [place] [address] [phone] Cell (Monday-Friday 8 AM - 5 PM) [phone] After 5 PM and follow prompts

## 2017-11-27 LAB — CBC WITH DIFFERENTIAL/PLATELET
BASOS PCT: 0.5 %
Basophils Absolute: 49 cells/uL (ref 0–200)
Eosinophils Absolute: 97 cells/uL (ref 15–500)
Eosinophils Relative: 1 %
HEMATOCRIT: 41.1 % (ref 38.5–50.0)
HEMOGLOBIN: 14.1 g/dL (ref 13.2–17.1)
LYMPHS ABS: 1785 {cells}/uL (ref 850–3900)
MCH: 30.9 pg (ref 27.0–33.0)
MCHC: 34.3 g/dL (ref 32.0–36.0)
MCV: 90.1 fL (ref 80.0–100.0)
MPV: 10.1 fL (ref 7.5–12.5)
Monocytes Relative: 11.9 %
Neutro Abs: 6615 cells/uL (ref 1500–7800)
Neutrophils Relative %: 68.2 %
Platelets: 235 10*3/uL (ref 140–400)
RBC: 4.56 10*6/uL (ref 4.20–5.80)
RDW: 12.2 % (ref 11.0–15.0)
TOTAL LYMPHOCYTE: 18.4 %
WBC: 9.7 10*3/uL (ref 3.8–10.8)
WBCMIX: 1154 {cells}/uL — AB (ref 200–950)

## 2017-11-27 LAB — COMPLETE METABOLIC PANEL WITH GFR
AG RATIO: 1.7 (calc) (ref 1.0–2.5)
ALKALINE PHOSPHATASE (APISO): 70 U/L (ref 40–115)
ALT: 12 U/L (ref 9–46)
AST: 15 U/L (ref 10–35)
Albumin: 4.3 g/dL (ref 3.6–5.1)
BUN: 16 mg/dL (ref 7–25)
CO2: 27 mmol/L (ref 20–32)
CREATININE: 1.08 mg/dL (ref 0.70–1.18)
Calcium: 9.4 mg/dL (ref 8.6–10.3)
Chloride: 98 mmol/L (ref 98–110)
GFR, Est African American: 75 mL/min/{1.73_m2} (ref >=60)
GFR, Est Non African American: 65 mL/min/{1.73_m2} (ref >=60)
GLOBULIN: 2.5 g/dL (ref 1.9–3.7)
Glucose, Bld: 93 mg/dL (ref 65–139)
Potassium: 4.7 mmol/L (ref 3.5–5.3)
SODIUM: 134 mmol/L — AB (ref 135–146)
Total Bilirubin: 0.4 mg/dL (ref 0.2–1.2)
Total Protein: 6.8 g/dL (ref 6.1–8.1)

## 2017-11-27 LAB — HEMOGLOBIN A1C
HEMOGLOBIN A1C: 5.9 %{Hb} — AB (ref <5.7)
Mean Plasma Glucose: 123 (calc)
eAG (mmol/L): 6.8 (calc)

## 2017-11-27 LAB — LIPID PANEL
CHOL/HDL RATIO: 2.3 (calc) (ref <5.0)
CHOLESTEROL: 158 mg/dL (ref <200)
HDL: 70 mg/dL (ref >40)
LDL Cholesterol (Calc): 71 mg/dL (calc)
Non-HDL Cholesterol (Calc): 88 mg/dL (calc) (ref <130)
Triglycerides: 91 mg/dL (ref <150)

## 2017-11-27 LAB — TSH: TSH: 0.89 mIU/L (ref 0.40–4.50)

## 2017-12-01 ENCOUNTER — Telehealth: Payer: Self-pay | Admitting: Nurse Practitioner

## 2017-12-01 DIAGNOSIS — G609 Hereditary and idiopathic neuropathy, unspecified: Secondary | ICD-10-CM

## 2017-12-01 NOTE — Telephone Encounter (Signed)
Hey there, I reached out to [facility] to schedule EMG/botox for pt. They have requested a referral be put into Epic so they may schedule a consult with pt for this procedure.  Thanks, Vilinda Blanks.

## 2017-12-02 DIAGNOSIS — Z466 Encounter for fitting and adjustment of urinary device: Secondary | ICD-10-CM | POA: Diagnosis not present

## 2017-12-02 DIAGNOSIS — R339 Retention of urine, unspecified: Secondary | ICD-10-CM | POA: Diagnosis not present

## 2017-12-02 NOTE — Telephone Encounter (Signed)
Neurology referral ordered.

## 2017-12-20 DIAGNOSIS — D1801 Hemangioma of skin and subcutaneous tissue: Secondary | ICD-10-CM | POA: Diagnosis not present

## 2017-12-20 DIAGNOSIS — Z08 Encounter for follow-up examination after completed treatment for malignant neoplasm: Secondary | ICD-10-CM | POA: Diagnosis not present

## 2017-12-20 DIAGNOSIS — L821 Other seborrheic keratosis: Secondary | ICD-10-CM | POA: Diagnosis not present

## 2017-12-20 DIAGNOSIS — L570 Actinic keratosis: Secondary | ICD-10-CM | POA: Diagnosis not present

## 2017-12-20 DIAGNOSIS — Z85820 Personal history of malignant melanoma of skin: Secondary | ICD-10-CM | POA: Diagnosis not present

## 2017-12-20 DIAGNOSIS — L2089 Other atopic dermatitis: Secondary | ICD-10-CM | POA: Diagnosis not present

## 2017-12-21 ENCOUNTER — Telehealth: Payer: Self-pay

## 2017-12-21 NOTE — Telephone Encounter (Signed)
Patient's daughter called to find out why patient's trazodone was not being filled. After reviewing chart, it was determined that a prescription was sent for #90 with 3 refills on 11/26/17. I called [facility] in [place] to see why rx hadn't been filled and was told that a rx was picked up on 11/29/17 for a 90 day supply. I called patient's daughter with this information and she stated she would have to see if her husband picked it up because she did not get it.

## 2017-12-21 NOTE — Telephone Encounter (Signed)
[name] called back to inform Coralyn Mark that her husband did pick-up rx and placed it in patient's linen closet.

## 2017-12-23 DIAGNOSIS — N319 Neuromuscular dysfunction of bladder, unspecified: Secondary | ICD-10-CM | POA: Diagnosis not present

## 2017-12-23 DIAGNOSIS — R339 Retention of urine, unspecified: Secondary | ICD-10-CM | POA: Diagnosis not present

## 2017-12-23 DIAGNOSIS — Z466 Encounter for fitting and adjustment of urinary device: Secondary | ICD-10-CM | POA: Diagnosis not present

## 2017-12-23 DIAGNOSIS — Z9359 Other cystostomy status: Secondary | ICD-10-CM | POA: Diagnosis not present

## 2017-12-24 DIAGNOSIS — Z9359 Other cystostomy status: Secondary | ICD-10-CM | POA: Diagnosis not present

## 2017-12-24 DIAGNOSIS — N401 Enlarged prostate with lower urinary tract symptoms: Secondary | ICD-10-CM | POA: Diagnosis not present

## 2017-12-24 DIAGNOSIS — N138 Other obstructive and reflux uropathy: Secondary | ICD-10-CM | POA: Diagnosis not present

## 2017-12-24 DIAGNOSIS — R338 Other retention of urine: Secondary | ICD-10-CM | POA: Diagnosis not present

## 2017-12-24 DIAGNOSIS — N3020 Other chronic cystitis without hematuria: Secondary | ICD-10-CM | POA: Diagnosis not present

## 2018-01-03 IMAGING — CR DG CHEST 2V
2 series · 2 of 2 positions shown · non-contrast
Comparison: Chest radiograph 03/14/2015

CLINICAL DATA: Chest pain and fever

EXAM:
CHEST  2 VIEW

[w chest lat]
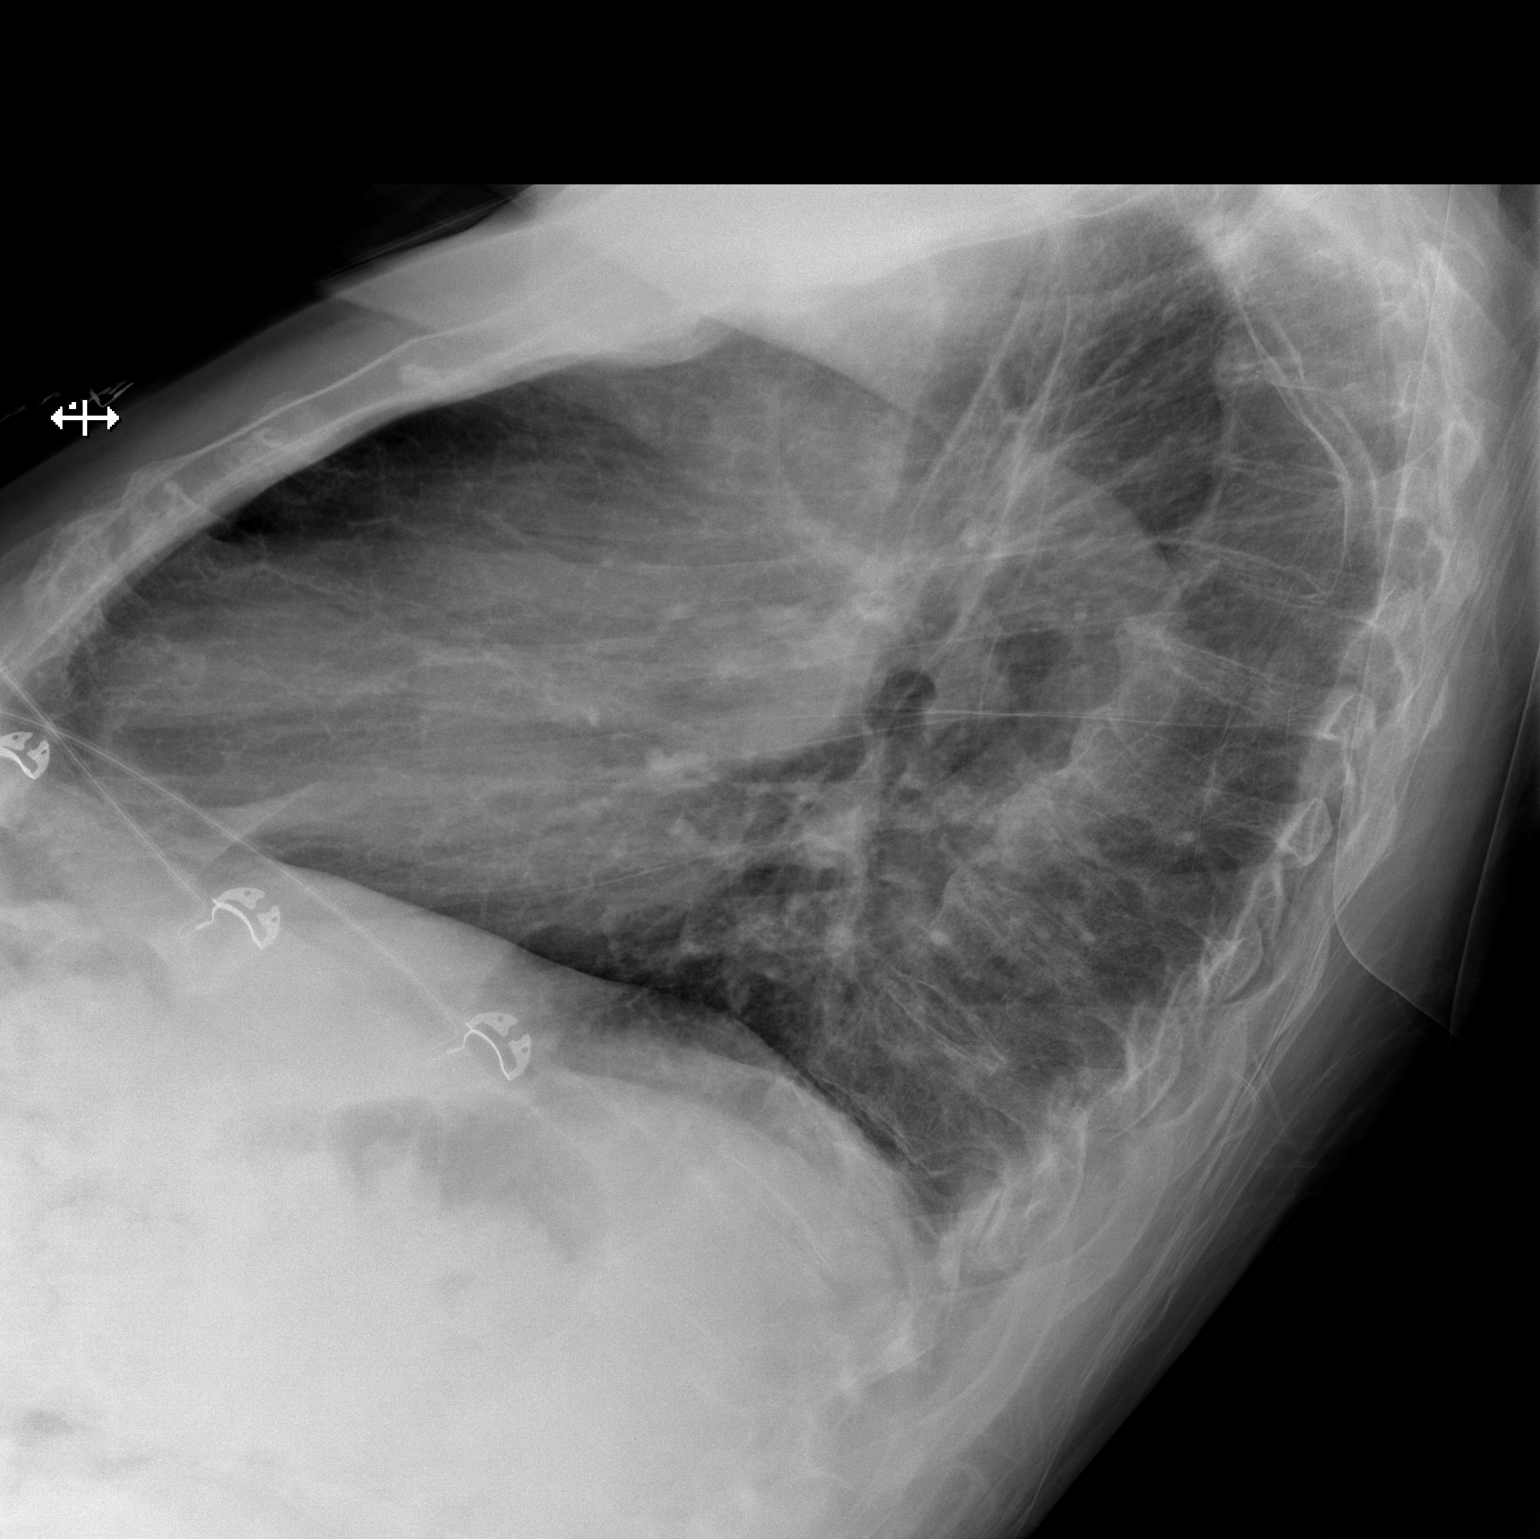

[x chest ap]
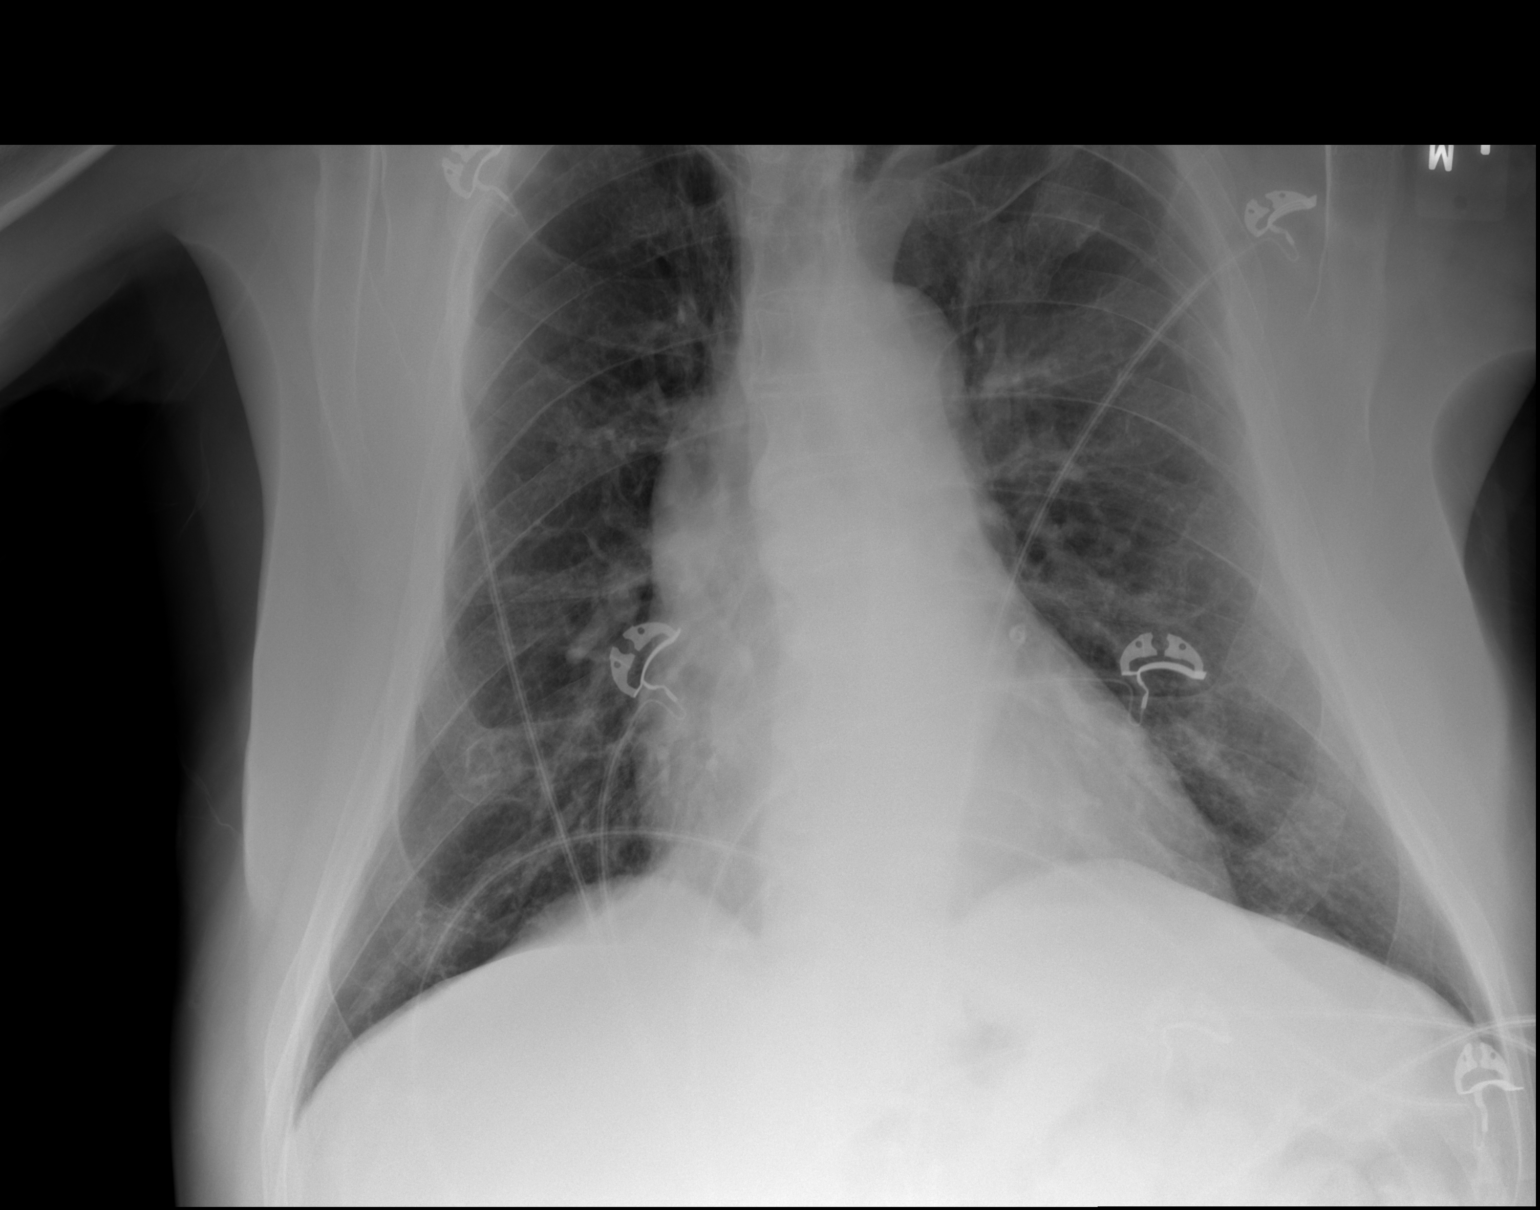

[2 of 2 positions shown; findings below may reference images not displayed]

FINDINGS: Unchanged mild cardiomegaly. No focal airspace consolidation or
pulmonary edema. No pneumothorax or sizable pleural effusion.
IMPRESSION: No active cardiopulmonary disease.

## 2018-01-13 ENCOUNTER — Encounter: Payer: Medicare HMO | Admitting: Diagnostic Neuroimaging

## 2018-01-13 ENCOUNTER — Ambulatory Visit (INDEPENDENT_AMBULATORY_CARE_PROVIDER_SITE_OTHER): Payer: Medicare HMO | Admitting: Diagnostic Neuroimaging

## 2018-01-13 DIAGNOSIS — Z466 Encounter for fitting and adjustment of urinary device: Secondary | ICD-10-CM | POA: Diagnosis not present

## 2018-01-13 DIAGNOSIS — Z0289 Encounter for other administrative examinations: Secondary | ICD-10-CM

## 2018-01-13 DIAGNOSIS — G609 Hereditary and idiopathic neuropathy, unspecified: Secondary | ICD-10-CM | POA: Diagnosis not present

## 2018-01-13 DIAGNOSIS — R339 Retention of urine, unspecified: Secondary | ICD-10-CM | POA: Diagnosis not present

## 2018-01-18 NOTE — Procedures (Signed)
[facility]  NCS (NERVE CONDUCTION STUDY) WITH EMG (ELECTROMYOGRAPHY) REPORT   STUDY DATE: 01/13/18 PATIENT NAME: Ricardo [name] DOB: 11-24-1935 MRN: [id number]  ORDERING CLINICIAN: [name], DO  TECHNOLOGIST: Belinda Block ELECTROMYOGRAPHER: Earlean Polka. Shundra Wirsing, MD  CLINICAL INFORMATION: 82 year old male with numbness.  FINDINGS: NERVE CONDUCTION STUDY:  Right ulnar motor response is normal.  Left peroneal motor response has normal distal latency, decreased amplitude, slow conduction velocity.  Bilateral tibial and right peroneal motor responses could not be obtained.  Right ulnar F-wave latency is normal.  Right ulnar sensory responses normal.  Bilateral sural and superficial peroneal sensory spots cannot be obtained.   NEEDLE ELECTROMYOGRAPHY:  Needle examination of right lower extremity vastus medialis, tibialis anterior, gastrocnemius is normal.   IMPRESSION:   Abnormal study demonstrating: - Severe length dependent axonal sensorimotor polyneuropathy.    INTERPRETING PHYSICIAN:  Penni Bombard, MD Certified in Neurology, Neurophysiology and Neuroimaging  [facility] [address], [place], [place] [address] [phone]   [facility]    Nerve / Sites Muscle Latency Ref. Amplitude Ref. Rel Amp Segments Distance Velocity Ref. Area    ms ms mV mV %  cm m/s m/s mVms  R Ulnar - ADM     Wrist ADM 3.0 ?3.3 9.9 ?6.0 100 Wrist - ADM 7   32.1     B.Elbow ADM 6.1  9.0  90.5 B.Elbow - Wrist 20 63 ?49 31.9     A.Elbow ADM 7.9  8.2  91.4 A.Elbow - B.Elbow 10 58 ?49 29.8         A.Elbow - Wrist      L Peroneal - EDB     Ankle EDB 5.5 ?6.5 0.3 ?2.0 100 Ankle - EDB 9   0.9     Fib head EDB 17.1  0.2  94.5 Fib head - Ankle 35 30 ?44 0.9     Pop fossa EDB 20.3  0.2  70.3 Pop fossa - Fib head 10 32 ?44 0.5         Pop fossa - Ankle      R Peroneal - EDB     Ankle EDB NR ?6.5 NR ?2.0 NR Ankle - EDB 9   NR     Fib  head EDB NR  NR  NR Fib head - Ankle 35 NR ?44 NR  L Tibial - AH     Ankle AH NR ?5.8 NR ?4.0 NR Ankle - AH 9   NR     Pop fossa AH NR  NR  NR Pop fossa - Ankle 43 NR ?41 NR  R Tibial - AH     Ankle AH  ?5.8  ?4.0  Ankle - AH 9        Pop fossa AH NR  NR  NR Pop fossa - Ankle 43 NR ?41 NR                SNC    Nerve / Sites Rec. Site Peak Lat Ref.  Amp Ref. Segments Distance    ms ms V V  cm  L Sural - Ankle (Calf)     Calf Ankle NR ?4.4 NR ?6 Calf - Ankle 14  R Sural - Ankle (Calf)     Calf Ankle NR ?4.4 NR ?6 Calf - Ankle 14  L Superficial peroneal - Ankle     Lat leg Ankle NR ?4.4 NR ?6 Lat leg - Ankle 14  R Superficial  peroneal - Ankle     Lat leg Ankle NR ?4.4 NR ?6 Lat leg - Ankle 14  R Ulnar - Orthodromic, (Dig V, Mid palm)     Dig V Wrist 3.1 ?3.1 12 ?5 Dig V - Wrist 41               F  Wave     Nerve F Lat Ref.   ms ms  R Ulnar - ADM 29.4 ?32.0        EMG full       EMG Summary Table    Spontaneous MUAP Recruitment  Muscle IA Fib PSW Fasc Other Amp Dur. Poly Pattern  R. Vastus medialis Normal None None None _______ Normal Normal Normal Normal  R. Tibialis anterior Normal None None None _______ Normal Normal Normal Normal  R. Gastrocnemius (Medial head) Normal None None None _______ Normal Normal Normal Normal

## 2018-02-03 ENCOUNTER — Telehealth: Payer: Self-pay

## 2018-02-03 DIAGNOSIS — Z466 Encounter for fitting and adjustment of urinary device: Secondary | ICD-10-CM | POA: Diagnosis not present

## 2018-02-03 DIAGNOSIS — R339 Retention of urine, unspecified: Secondary | ICD-10-CM | POA: Diagnosis not present

## 2018-02-03 NOTE — Telephone Encounter (Signed)
-----   Message from Lauree Chandler, NP sent at 02/03/2018  8:40 AM EST ----- Can you please call this pt and see if the results of his nerve conduction studies were reviewed with him when he was at the neurologist office?

## 2018-02-03 NOTE — Telephone Encounter (Signed)
Studying noted sensorimotor polyneuropathy, which could be related to abnormal blood sugars (appears he has not had uncontrolled blood sugars in the past), vitamin levels- may be contributing and would need to follow up on this in office, or due to age.

## 2018-02-03 NOTE — Telephone Encounter (Signed)
Called and spoke with Sherley's daughter. She stated that no one has went over results with him from the nerve  conduction study. Informed daughter that I would route message to Janett Billow and follow up with her once the provider has responded.

## 2018-02-08 NOTE — Telephone Encounter (Signed)
Late entry -- Spoke with patient's daughter [name] again Thursday 02/03/2018, informed her of Jessica's findings. [name] verbalized understanding and stated she did not feel they needed an appointment right away. Patient would come for next visit and discuss findings.

## 2018-02-24 DIAGNOSIS — R339 Retention of urine, unspecified: Secondary | ICD-10-CM | POA: Diagnosis not present

## 2018-02-24 DIAGNOSIS — Z466 Encounter for fitting and adjustment of urinary device: Secondary | ICD-10-CM | POA: Diagnosis not present

## 2018-03-07 ENCOUNTER — Encounter (HOSPITAL_BASED_OUTPATIENT_CLINIC_OR_DEPARTMENT_OTHER): Payer: Medicare HMO | Attending: Internal Medicine

## 2018-03-07 DIAGNOSIS — N3020 Other chronic cystitis without hematuria: Secondary | ICD-10-CM | POA: Insufficient documentation

## 2018-03-07 DIAGNOSIS — H547 Unspecified visual loss: Secondary | ICD-10-CM | POA: Insufficient documentation

## 2018-03-07 DIAGNOSIS — G9009 Other idiopathic peripheral autonomic neuropathy: Secondary | ICD-10-CM | POA: Diagnosis not present

## 2018-03-07 DIAGNOSIS — L97511 Non-pressure chronic ulcer of other part of right foot limited to breakdown of skin: Secondary | ICD-10-CM | POA: Insufficient documentation

## 2018-03-07 DIAGNOSIS — H9190 Unspecified hearing loss, unspecified ear: Secondary | ICD-10-CM | POA: Insufficient documentation

## 2018-03-07 DIAGNOSIS — M2140 Flat foot [pes planus] (acquired), unspecified foot: Secondary | ICD-10-CM | POA: Insufficient documentation

## 2018-03-07 DIAGNOSIS — L84 Corns and callosities: Secondary | ICD-10-CM | POA: Diagnosis not present

## 2018-03-15 DIAGNOSIS — H9190 Unspecified hearing loss, unspecified ear: Secondary | ICD-10-CM | POA: Diagnosis not present

## 2018-03-15 DIAGNOSIS — L84 Corns and callosities: Secondary | ICD-10-CM | POA: Diagnosis not present

## 2018-03-15 DIAGNOSIS — L97512 Non-pressure chronic ulcer of other part of right foot with fat layer exposed: Secondary | ICD-10-CM | POA: Diagnosis not present

## 2018-03-15 DIAGNOSIS — H547 Unspecified visual loss: Secondary | ICD-10-CM | POA: Diagnosis not present

## 2018-03-15 DIAGNOSIS — M2140 Flat foot [pes planus] (acquired), unspecified foot: Secondary | ICD-10-CM | POA: Diagnosis not present

## 2018-03-15 DIAGNOSIS — L97511 Non-pressure chronic ulcer of other part of right foot limited to breakdown of skin: Secondary | ICD-10-CM | POA: Diagnosis not present

## 2018-03-15 DIAGNOSIS — G9009 Other idiopathic peripheral autonomic neuropathy: Secondary | ICD-10-CM | POA: Diagnosis not present

## 2018-03-15 DIAGNOSIS — N3020 Other chronic cystitis without hematuria: Secondary | ICD-10-CM | POA: Diagnosis not present

## 2018-03-17 DIAGNOSIS — R339 Retention of urine, unspecified: Secondary | ICD-10-CM | POA: Diagnosis not present

## 2018-03-17 DIAGNOSIS — Z466 Encounter for fitting and adjustment of urinary device: Secondary | ICD-10-CM | POA: Diagnosis not present

## 2018-03-22 DIAGNOSIS — H9190 Unspecified hearing loss, unspecified ear: Secondary | ICD-10-CM | POA: Diagnosis not present

## 2018-03-22 DIAGNOSIS — H547 Unspecified visual loss: Secondary | ICD-10-CM | POA: Diagnosis not present

## 2018-03-22 DIAGNOSIS — L97519 Non-pressure chronic ulcer of other part of right foot with unspecified severity: Secondary | ICD-10-CM | POA: Diagnosis not present

## 2018-03-22 DIAGNOSIS — N3020 Other chronic cystitis without hematuria: Secondary | ICD-10-CM | POA: Diagnosis not present

## 2018-03-22 DIAGNOSIS — L84 Corns and callosities: Secondary | ICD-10-CM | POA: Diagnosis not present

## 2018-03-22 DIAGNOSIS — L97511 Non-pressure chronic ulcer of other part of right foot limited to breakdown of skin: Secondary | ICD-10-CM | POA: Diagnosis not present

## 2018-03-22 DIAGNOSIS — M2140 Flat foot [pes planus] (acquired), unspecified foot: Secondary | ICD-10-CM | POA: Diagnosis not present

## 2018-03-22 DIAGNOSIS — G9009 Other idiopathic peripheral autonomic neuropathy: Secondary | ICD-10-CM | POA: Diagnosis not present

## 2018-03-28 ENCOUNTER — Other Ambulatory Visit: Payer: Self-pay | Admitting: *Deleted

## 2018-03-28 DIAGNOSIS — I10 Essential (primary) hypertension: Secondary | ICD-10-CM

## 2018-03-28 MED ORDER — AMLODIPINE BESYLATE 5 MG PO TABS: 5.0000 mg | ORAL_TABLET | Freq: Every day | ORAL | 0 refills | Status: DC

## 2018-03-28 NOTE — Telephone Encounter (Signed)
[facility] 

## 2018-03-29 DIAGNOSIS — L84 Corns and callosities: Secondary | ICD-10-CM | POA: Diagnosis not present

## 2018-03-29 DIAGNOSIS — M2140 Flat foot [pes planus] (acquired), unspecified foot: Secondary | ICD-10-CM | POA: Diagnosis not present

## 2018-03-29 DIAGNOSIS — H9190 Unspecified hearing loss, unspecified ear: Secondary | ICD-10-CM | POA: Diagnosis not present

## 2018-03-29 DIAGNOSIS — N3020 Other chronic cystitis without hematuria: Secondary | ICD-10-CM | POA: Diagnosis not present

## 2018-03-29 DIAGNOSIS — L97512 Non-pressure chronic ulcer of other part of right foot with fat layer exposed: Secondary | ICD-10-CM | POA: Diagnosis not present

## 2018-03-29 DIAGNOSIS — L97511 Non-pressure chronic ulcer of other part of right foot limited to breakdown of skin: Secondary | ICD-10-CM | POA: Diagnosis not present

## 2018-03-29 DIAGNOSIS — H547 Unspecified visual loss: Secondary | ICD-10-CM | POA: Diagnosis not present

## 2018-03-29 DIAGNOSIS — G9009 Other idiopathic peripheral autonomic neuropathy: Secondary | ICD-10-CM | POA: Diagnosis not present

## 2018-04-07 DIAGNOSIS — Z466 Encounter for fitting and adjustment of urinary device: Secondary | ICD-10-CM | POA: Diagnosis not present

## 2018-04-07 DIAGNOSIS — R339 Retention of urine, unspecified: Secondary | ICD-10-CM | POA: Diagnosis not present

## 2018-04-12 ENCOUNTER — Encounter (HOSPITAL_BASED_OUTPATIENT_CLINIC_OR_DEPARTMENT_OTHER): Payer: Medicare HMO | Attending: Internal Medicine

## 2018-04-12 DIAGNOSIS — J449 Chronic obstructive pulmonary disease, unspecified: Secondary | ICD-10-CM | POA: Insufficient documentation

## 2018-04-12 DIAGNOSIS — S91301A Unspecified open wound, right foot, initial encounter: Secondary | ICD-10-CM | POA: Diagnosis not present

## 2018-04-12 DIAGNOSIS — I10 Essential (primary) hypertension: Secondary | ICD-10-CM | POA: Diagnosis not present

## 2018-04-12 DIAGNOSIS — G9009 Other idiopathic peripheral autonomic neuropathy: Secondary | ICD-10-CM | POA: Diagnosis not present

## 2018-04-12 DIAGNOSIS — Z09 Encounter for follow-up examination after completed treatment for conditions other than malignant neoplasm: Secondary | ICD-10-CM | POA: Diagnosis not present

## 2018-04-12 DIAGNOSIS — Z872 Personal history of diseases of the skin and subcutaneous tissue: Secondary | ICD-10-CM | POA: Insufficient documentation

## 2018-05-30 ENCOUNTER — Ambulatory Visit: Payer: Self-pay

## 2018-05-31 ENCOUNTER — Encounter: Payer: Self-pay | Admitting: Nurse Practitioner

## 2018-06-02 ENCOUNTER — Ambulatory Visit (INDEPENDENT_AMBULATORY_CARE_PROVIDER_SITE_OTHER): Payer: Medicare HMO | Admitting: Nurse Practitioner

## 2018-06-02 ENCOUNTER — Encounter: Payer: Self-pay | Admitting: Nurse Practitioner

## 2018-06-02 ENCOUNTER — Ambulatory Visit: Payer: Medicare HMO | Admitting: Nurse Practitioner

## 2018-06-02 ENCOUNTER — Other Ambulatory Visit: Payer: Self-pay

## 2018-06-02 DIAGNOSIS — K219 Gastro-esophageal reflux disease without esophagitis: Secondary | ICD-10-CM

## 2018-06-02 DIAGNOSIS — Z0000 Encounter for general adult medical examination without abnormal findings: Secondary | ICD-10-CM

## 2018-06-02 DIAGNOSIS — J438 Other emphysema: Secondary | ICD-10-CM

## 2018-06-02 DIAGNOSIS — E782 Mixed hyperlipidemia: Secondary | ICD-10-CM | POA: Diagnosis not present

## 2018-06-02 DIAGNOSIS — R739 Hyperglycemia, unspecified: Secondary | ICD-10-CM | POA: Diagnosis not present

## 2018-06-02 DIAGNOSIS — G609 Hereditary and idiopathic neuropathy, unspecified: Secondary | ICD-10-CM | POA: Diagnosis not present

## 2018-06-02 DIAGNOSIS — I10 Essential (primary) hypertension: Secondary | ICD-10-CM | POA: Diagnosis not present

## 2018-06-02 DIAGNOSIS — Z9359 Other cystostomy status: Secondary | ICD-10-CM | POA: Diagnosis not present

## 2018-06-02 DIAGNOSIS — F39 Unspecified mood [affective] disorder: Secondary | ICD-10-CM

## 2018-06-02 DIAGNOSIS — K5900 Constipation, unspecified: Secondary | ICD-10-CM

## 2018-06-02 DIAGNOSIS — R69 Illness, unspecified: Secondary | ICD-10-CM | POA: Diagnosis not present

## 2018-06-02 MED ORDER — ZOSTER VAC RECOMB ADJUVANTED 50 MCG/0.5ML IM SUSR: 0.5000 mL | Freq: Once | INTRAMUSCULAR | 1 refills | Status: AC

## 2018-06-02 MED ORDER — TETANUS-DIPHTH-ACELL PERTUSSIS 5-2.5-18.5 LF-MCG/0.5 IM SUSP: 0.5000 mL | Freq: Once | INTRAMUSCULAR | 0 refills | Status: AC

## 2018-06-02 MED ORDER — GABAPENTIN 100 MG PO CAPS: 100.0000 mg | ORAL_CAPSULE | Freq: Every day | ORAL | 1 refills | Status: DC

## 2018-06-02 NOTE — Patient Instructions (Signed)
To start gabapentin 100 mg by mouth daily at bedtime for neuropathy

## 2018-06-02 NOTE — Patient Instructions (Signed)
Ricardo [name] , Thank you for taking time to come for your Medicare Wellness Visit. I appreciate your ongoing commitment to your health goals. Please review the following plan we discussed and let me know if I can assist you in the future.   Screening recommendations/referrals: Colonoscopy aged out Recommended yearly ophthalmology/optometry visit for glaucoma screening and checkup Recommended yearly dental visit for hygiene and checkup  Vaccinations: Influenza vaccine will be due 10/2018 Pneumococcal vaccine: Needs prevnar 13- will get next time he is in office   Tdap vaccine sent to pharmacy Shingles vaccine sent to pharmacy   Advanced directives: on-file  Conditions/risks identified: falls related to decrease mobility/neuropathy   Next appointment: 1 year   Preventive Care 49 Years and Older, Male Preventive care refers to lifestyle choices and visits with your health care provider that can promote health and wellness. What does preventive care include?  A yearly physical exam. This is also called an annual well check.  Dental exams once or twice a year.  Routine eye exams. Ask your health care provider how often you should have your eyes checked.  Personal lifestyle choices, including:  Daily care of your teeth and gums.  Regular physical activity.  Eating a healthy diet.  Avoiding tobacco and drug use.  Limiting alcohol use.  Practicing safe sex.  Taking low doses of aspirin every day.  Taking vitamin and mineral supplements as recommended by your health care provider. What happens during an annual well check? The services and screenings done by your health care provider during your annual well check will depend on your age, overall health, lifestyle risk factors, and family history of disease. Counseling  Your health care provider may ask you questions about your:  Alcohol use.  Tobacco use.  Drug use.  Emotional well-being.  Home and relationship  well-being.  Sexual activity.  Eating habits.  History of falls.  Memory and ability to understand (cognition).  Work and work Statistician. Screening  You may have the following tests or measurements:  Height, weight, and BMI.  Blood pressure.  Lipid and cholesterol levels. These may be checked every 5 years, or more frequently if you are over 5 years old.  Skin check.  Lung cancer screening. You may have this screening every year starting at age 81 if you have a 30-pack-year history of smoking and currently smoke or have quit within the past 15 years.  Fecal occult blood test (FOBT) of the stool. You may have this test every year starting at age 81.  Flexible sigmoidoscopy or colonoscopy. You may have a sigmoidoscopy every 5 years or a colonoscopy every 10 years starting at age 81.  Prostate cancer screening. Recommendations will vary depending on your family history and other risks.  Hepatitis C blood test.  Hepatitis B blood test.  Sexually transmitted disease (STD) testing.  Diabetes screening. This is done by checking your blood sugar (glucose) after you have not eaten for a while (fasting). You may have this done every 1-3 years.  Abdominal aortic aneurysm (AAA) screening. You may need this if you are a current or former smoker.  Osteoporosis. You may be screened starting at age 81 if you are at high risk. Talk with your health care provider about your test results, treatment options, and if necessary, the need for more tests. Vaccines  Your health care provider may recommend certain vaccines, such as:  Influenza vaccine. This is recommended every year.  Tetanus, diphtheria, and acellular pertussis (Tdap, Td) vaccine. You  may need a Td booster every 10 years.  Zoster vaccine. You may need this after age 81.  Pneumococcal 13-valent conjugate (PCV13) vaccine. One dose is recommended after age 81.  Pneumococcal polysaccharide (PPSV23) vaccine. One dose is  recommended after age 81. Talk to your health care provider about which screenings and vaccines you need and how often you need them. This information is not intended to replace advice given to you by your health care provider. Make sure you discuss any questions you have with your health care provider. Document Released: 02/15/2015 Document Revised: 10/09/2015 Document Reviewed: 11/20/2014 Elsevier Interactive Patient Education  2017 [place] Prevention in the Home Falls can cause injuries. They can happen to people of all ages. There are many things you can do to make your home safe and to help prevent falls. What can I do on the outside of my home?  Regularly fix the edges of walkways and driveways and fix any cracks.  Remove anything that might make you trip as you walk through a door, such as a raised step or threshold.  Trim any bushes or trees on the path to your home.  Use bright outdoor lighting.  Clear any walking paths of anything that might make someone trip, such as rocks or tools.  Regularly check to see if handrails are loose or broken. Make sure that both sides of any steps have handrails.  Any raised decks and porches should have guardrails on the edges.  Have any leaves, snow, or ice cleared regularly.  Use sand or salt on walking paths during winter.  Clean up any spills in your garage right away. This includes oil or grease spills. What can I do in the bathroom?  Use night lights.  Install grab bars by the toilet and in the tub and shower. Do not use towel bars as grab bars.  Use non-skid mats or decals in the tub or shower.  If you need to sit down in the shower, use a plastic, non-slip stool.  Keep the floor dry. Clean up any water that spills on the floor as soon as it happens.  Remove soap buildup in the tub or shower regularly.  Attach bath mats securely with double-sided non-slip rug tape.  Do not have throw rugs and other things on  the floor that can make you trip. What can I do in the bedroom?  Use night lights.  Make sure that you have a light by your bed that is easy to reach.  Do not use any sheets or blankets that are too big for your bed. They should not hang down onto the floor.  Have a firm chair that has side arms. You can use this for support while you get dressed.  Do not have throw rugs and other things on the floor that can make you trip. What can I do in the kitchen?  Clean up any spills right away.  Avoid walking on wet floors.  Keep items that you use a lot in easy-to-reach places.  If you need to reach something above you, use a strong step stool that has a grab bar.  Keep electrical cords out of the way.  Do not use floor polish or wax that makes floors slippery. If you must use wax, use non-skid floor wax.  Do not have throw rugs and other things on the floor that can make you trip. What can I do with my stairs?  Do not leave any items  on the stairs.  Make sure that there are handrails on both sides of the stairs and use them. Fix handrails that are broken or loose. Make sure that handrails are as long as the stairways.  Check any carpeting to make sure that it is firmly attached to the stairs. Fix any carpet that is loose or worn.  Avoid having throw rugs at the top or bottom of the stairs. If you do have throw rugs, attach them to the floor with carpet tape.  Make sure that you have a light switch at the top of the stairs and the bottom of the stairs. If you do not have them, ask someone to add them for you. What else can I do to help prevent falls?  Wear shoes that:  Do not have high heels.  Have rubber bottoms.  Are comfortable and fit you well.  Are closed at the toe. Do not wear sandals.  If you use a stepladder:  Make sure that it is fully opened. Do not climb a closed stepladder.  Make sure that both sides of the stepladder are locked into place.  Ask someone to  hold it for you, if possible.  Clearly mark and make sure that you can see:  Any grab bars or handrails.  First and last steps.  Where the edge of each step is.  Use tools that help you move around (mobility aids) if they are needed. These include:  Canes.  Walkers.  Scooters.  Crutches.  Turn on the lights when you go into a dark area. Replace any light bulbs as soon as they burn out.  Set up your furniture so you have a clear path. Avoid moving your furniture around.  If any of your floors are uneven, fix them.  If there are any pets around you, be aware of where they are.  Review your medicines with your doctor. Some medicines can make you feel dizzy. This can increase your chance of falling. Ask your doctor what other things that you can do to help prevent falls. This information is not intended to replace advice given to you by your health care provider. Make sure you discuss any questions you have with your health care provider. Document Released: 11/15/2008 Document Revised: 06/27/2015 Document Reviewed: 02/23/2014 Elsevier Interactive Patient Education  2017 [employer].

## 2018-06-02 NOTE — Progress Notes (Signed)
Subjective:   Ricardo [name] is a 83 y.o. male who presents for Medicare Annual/Subsequent preventive examination.  Review of Systems:   Cardiac Risk Factors include: advanced age (>18men, >59 women);male gender;sedentary lifestyle;family history of premature cardiovascular disease;hypertension     Objective:    Vitals: There were no vitals taken for this visit.  There is no height or weight on file to calculate BMI.  Advanced Directives 06/02/2018 05/25/2017 03/09/2017 09/25/2016 03/20/2016 03/10/2016 02/20/2016  Does Patient Have a Medical Advance Directive? Yes Yes Yes Yes Yes No No  Type of Paramedic of [place];Living will [place];Living will Healthcare Power of [place];Living will [place] - -  Does patient want to make changes to medical advance directive? No - Patient declined No - Patient declined No - Patient declined - - - -  Copy of [place] in Chart? Yes - validated most recent copy scanned in chart (See row information) Yes - Yes Yes - -  Would patient like information on creating a medical advance directive? - - - - - - -    Tobacco Social History   Tobacco Use  Smoking Status Former Smoker  . Years: 10.00  . Types: Cigarettes  . Start date: 02/10/1955  . Last attempt to quit: 02/09/1965  . Years since quitting: 53.3  Smokeless Tobacco Never Used     Counseling given: Not Answered   Clinical Intake:  Pre-visit preparation completed: Yes  Pain : No/denies pain     BMI - recorded: 25.5 Nutritional Status: BMI 25 -29 Overweight Nutritional Risks: None Diabetes: No  How often do you need to have someone help you when you read instructions, pamphlets, or other written materials from your doctor or pharmacy?: 5 - Always What is the last grade level you completed in school?: a couple of masters degrees in chemistry   Interpreter Needed?: No      Past Medical History:  Diagnosis Date  . Anxiety   . Arthritis    neck   . 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 COPD (chronic obstructive pulmonary disease) ([place])   . 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 Eczema   . Enlarged prostate   . High cholesterol   . Hypertension   . Loss of balance    due to meningitis  . Meningitis   . Neurogenic bladder    History of  . Numbness    in feet due to meningitis   . Rotator cuff tear    right  . Skin cancer   . Stroke [facility])    mini strokes in hospital with meningitis  . Unable to walk    due to meningitis   Past Surgical History:  Procedure Laterality Date  . APPENDECTOMY    . CATARACT EXTRACTION Bilateral 03/05/2014  . COCHLEAR IMPLANT    . CYSTOSCOPY WITH LITHOLAPAXY N/A 05/14/2014   Procedure: CYSTOSCOPY WITH LITHOLAPAXY;  Surgeon: [name], MD;  [facility]: [facility];  Service: Urology;  Laterality: N/A;  . CYSTOSCOPY WITH LITHOLAPAXY N/A 04/22/2015   Procedure: CYSTOSCOPY WITH LITHOLAPAXY;  Surgeon: [name], MD;  [facility];  Service: Urology;  Laterality: N/A;  . INSERTION OF SUPRAPUBIC CATHETER N/A 05/14/2014   Procedure: INSERTION OF SUPRAPUBIC CATHETER;  Surgeon: [name], MD;  [facility];  Service: Urology;  Laterality: N/A;  .  INSERTION OF SUPRAPUBIC CATHETER N/A 04/22/2015   Procedure: INSERTION OF SUPRAPUBIC CATHETER;  Surgeon: [name], MD;  [facility];  Service: Urology;  Laterality: N/A;  . PROSTATE SURGERY     biopsy x 2  . TESTICLE REMOVAL Left    October 1956  . TONSILLECTOMY     childhood   Family History  Problem Relation Age of Onset  . Stroke Mother   . Heart failure Father    Social History   Socioeconomic History  . Marital status: Widowed    Spouse name: Not on file  . Number of children: Not on file  . Years of education: Not on file  . Highest education level: Not on file  Occupational History  . Not on file   Social Needs  . Financial resource strain: Not hard at all  . Food insecurity:    Worry: Never true    Inability: Never true  . Transportation needs:    Medical: No    Non-medical: No  Tobacco Use  . Smoking status: Former Smoker    Years: 10.00    Types: Cigarettes    Start date: 02/10/1955    Last attempt to quit: 02/09/1965    Years since quitting: 53.3  . Smokeless tobacco: Never Used  Substance and Sexual Activity  . Alcohol use: No    Alcohol/week: 0.0 standard drinks    Comment: Only on holiday  . Drug use: No  . Sexual activity: Never  Lifestyle  . Physical activity:    Days per week: 7 days    Minutes per session: 60 min  . Stress: Only a little  Relationships  . Social connections:    Talks on phone: More than three times a week    Gets together: More than three times a week    Attends religious service: Never    Active member of club or organization: No    Attends meetings of clubs or organizations: Never    Relationship status: Widowed  Other Topics Concern  . Not on file  Social History Narrative   Diet- Fairly balanced   Caffeine- Yes, Coffee   Married- 1964, [place] living now with daughter, all on the first floor   Pets-4, dog and Radiation protection practitioner, English as a second language teacher for [employer], Chief Financial Officer, Freight forwarder   Exercise- Yes, Rehab 3 times a week   Living will- Yes   DNR- Yes   POA/HPOA- Yes       Outpatient Encounter Medications as of 06/02/2018  Medication Sig  . amLODipine (NORVASC) 5 MG tablet Take 1 tablet (5 mg total) by mouth daily.  Marland Kitchen atorvastatin (LIPITOR) 10 MG tablet TAKE 1 TABLET (10 MG TOTAL) BY MOUTH AT BEDTIME.  . Calcium Carbonate-Vit D-Min (CALCIUM 1200 PO) Take 1 tablet by mouth daily.  . citalopram (CELEXA) 20 MG tablet TAKE ONE TABLET BY MOUTH ONCE DAILY FOR ANXIETY  . fluticasone furoate-vilanterol (BREO ELLIPTA) 100-25 MCG/INH AEPB TAKE 1 PUFF BY MOUTH EVERY DAY  . Lactobacillus (ACIDOPHILUS) 100 MG CAPS Take  100 mg by mouth every morning.   Marland Kitchen losartan (COZAAR) 25 MG tablet TAKE 1 TABLET BY MOUTH ONCE DAILY TO CONTROL BLOOD PRESSURE  . Magnesium 500 MG CAPS Take 1 capsule by mouth daily.  . magnesium hydroxide (MILK OF MAGNESIA) 400 MG/5ML suspension Take 15 mLs by mouth daily as needed for mild constipation.  . Melatonin 10 MG TABS Take 10 mg by mouth at bedtime.   . pantoprazole (  PROTONIX) 40 MG tablet TAKE ONE TABLET BY MOUTH ONCE DAILY FOR STOMACH  . sodium chloride irrigation 0.9 % irrigation Bladder wash  . traZODone (DESYREL) 100 MG tablet TAKE 1 TABLET BY MOUTH EVERYDAY AT BEDTIME  . triamcinolone cream (KENALOG) 0.1 % Apply 1 application topically daily.  Marland Kitchen UNABLE TO FIND Med Name: Renacidan irrigate 2 times daily.  . [DISCONTINUED] aspirin 81 MG chewable tablet Take one tablet by mouth once daily   No facility-administered encounter medications on file as of 06/02/2018.     Activities of Daily Living In your present state of health, do you have any difficulty performing the following activities: 06/02/2018  Hearing? Y  Comment legally deaf  Vision? Y  Comment legally blind  Difficulty concentrating or making decisions? Y  Walking or climbing stairs? Y  Comment wheelchair bound  Dressing or bathing? Y  Comment daughter and caregivers assist  Doing errands, shopping? Y  Preparing Food and eating ? Y  Using the Toilet? Y  Comment has assistive devices that help  In the past six months, have you accidently leaked urine? N  Do you have problems with loss of bowel control? N  Managing your Medications? Y  Managing your Finances? Y  Housekeeping or managing your Housekeeping? Y  Some recent data might be hidden    Patient Care Team: Lauree Chandler, NP as PCP - General (Geriatric Medicine) [name], MD (Inactive) as Consulting Physician (Urology)   Assessment:   This is a routine wellness examination for [facility].  Exercise Activities and Dietary recommendations  Current Exercise Habits: Home exercise routine, Type of exercise: strength training/weights, Time (Minutes): 25, Frequency (Times/Week): 7, Weekly Exercise (Minutes/Week): 175, Intensity: Moderate, Exercise limited by: neurologic condition(s);orthopedic condition(s)  Goals    . <enter goal here>     Starting 03/20/16, I will maintain my current lifestyle.     . Patient Stated     Would like to continue exercise        Fall Risk Fall Risk  06/02/2018 11/26/2017 05/25/2017 03/09/2017 12/29/2016  Falls in the past year? 0 No No No No  Number falls in past yr: 0 - - - -  Comment - - - - -  Injury with Fall? 0 - - - -  Risk Factor Category  - - - - -  Risk for fall due to : - - - - -  Follow up - - - - -   Is the patient's home free of loose throw rugs in walkways, pet beds, electrical cords, etc?   yes      Grab bars in the bathroom? yes      Handrails on the stairs?   yes      Adequate lighting?   yes  Timed Get Up and Go Performed: na  Depression Screen PHQ 2/9 Scores 11/26/2017 05/25/2017 03/09/2017 09/25/2016  PHQ - 2 Score 0 0 0 0    Cognitive Function MMSE - Mini Mental State Exam 05/25/2017 03/20/2016 10/11/2015 10/10/2014  Not completed: Unable to complete Unable to complete Unable to complete -  Orientation to time 5 - - 5  Orientation to Place 4 - - 5  Registration 3 - - 3  Attention/ Calculation 5 - - 5  Recall 2 - - 3  Language- name 2 objects 0 - - 2  Language- repeat 1 - - 1  Language- follow 3 step command 0 - - 3  Language- read & follow direction 0 - - 0  Write a sentence 0 - - 0  Copy design 0 - - 0  Total score 20 - - 27     6CIT Screen 06/02/2018  What Year? 0 points  What month? 0 points  What time? 0 points  Count back from 20 0 points  Months in reverse 4 points  Repeat phrase 2 points  Total Score 6    Immunization History  Administered Date(s) Administered  . Influenza, High Dose Seasonal PF 09/25/2016  . Influenza,inj,Quad PF,6+ Mos 10/10/2014,  10/11/2015, 11/26/2017  . Influenza-Unspecified 02/03/2012  . Pneumococcal Polysaccharide-23 08/14/2013  . Pneumococcal-Unspecified 02/02/2013  . Zoster 02/03/2011    Qualifies for Shingles Vaccine? Yes  Screening Tests Health Maintenance  Topic Date Due  . TETANUS/TDAP  12/30/1956  . PNA vac Low Risk Adult (2 of 2 - PCV13) 08/15/2014  . INFLUENZA VACCINE  09/03/2018   Cancer Screenings: Lung: Low Dose CT Chest recommended if Age 18-80 years, 30 pack-year currently smoking OR have quit w/in 15years. Patient does not qualify. Colorectal: aged out  Additional Screenings:  Hepatitis C Screening: negative.       Plan:    I have personally reviewed and noted the following in the patient's chart:   . Medical and social history . Use of alcohol, tobacco or illicit drugs  . Current medications and supplements . Functional ability and status . Nutritional status . Physical activity . Advanced directives . List of other physicians . Hospitalizations, surgeries, and ER visits in previous 12 months . Vitals . Screenings to include cognitive, depression, and falls . Referrals and appointments  In addition, I have reviewed and discussed with patient certain preventive protocols, quality metrics, and best practice recommendations. A written personalized care plan for preventive services as well as general preventive health recommendations were provided to patient.     Lauree Chandler, NP  06/02/2018

## 2018-06-02 NOTE — Progress Notes (Signed)
This service is provided via telemedicine  No vital signs collected/recorded due to the encounter was a telemedicine visit.   Location of patient (ex: home, work): Home   Patient consents to a telephone visit: Yes  Location of the provider (ex: office, home):Office [place]  Name of any referring provider:Jessica Eubanks NP  Names of all persons participating in the telemedicine service and their role in the encounter: Ricardo [name] patient, daughter [name] NP  Time spent on call:  8 mins

## 2018-06-02 NOTE — Progress Notes (Signed)
This service is provided via video- telemedicine  No vital signs collected/recorded due to the encounter was a telemedicine visit.   Location of patient (ex: home, work): Home   Patient consents to a video- telephone visit: Yes  Location of the provider (ex: office, home): [facility]   Name of any referring provider: ,NP  Names of all persons participating in the telemedicine service and their role in the encounter: Tiffany Johnson CMA,[name] patietn, Ann Held NP  Time spent on call: 8 mins  Patient has a 6 month routine follow up   Virtual Visit via Video Note  I connected with Ricardo [name] on 06/02/18 at 10:30 AM EDT by a video enabled telemedicine application and verified that I am speaking with the correct person using two identifiers.  Location: Patient: home Provider: office    I discussed the limitations of evaluation and management by telemedicine and the availability of in person appointments. The patient expressed understanding and agreed to proceed.      Careteam: Patient Care Team: Lauree Chandler, NP as PCP - General (Geriatric Medicine) [name], MD (Inactive) as Consulting Physician (Urology)  Advanced Directive information Does Patient Have a Medical Advance Directive?: Yes, Type of Advance Directive: [place];Living will, Does patient want to make changes to medical advance directive?: No - Patient declined  Allergies  Allergen Reactions  . Peanut Oil Shortness Of Breath, Itching and Swelling    Swelling of the throat   . Peanut-Containing Drug Products Shortness Of Breath and Swelling    Swelling of the throat     Chief Complaint  Patient presents with  . Medical Management of Chronic Issues    routine 6 month follow up televist      HPI: Patient is a 83 y.o. male for routine follow up  Dermatology follows skin lesions. No new finding  He had  increased easy bruising on b/l UE. He was taking ASA daily but has stopped and helped bruising.   He is c/a legs as they feel "heavy" and painfuly legs every AM when he wakes up. He has a hx toe numbness. His leg sx's actually improve after he gets up and moves around in the AM. Of note, he has noticed more fingertip numbness also but pretty much has resolved at this time. Waking him up in the middle of the night due to pain. Once he is out of bed it is okay.   Poor vision and hearing - He is legally blind in [place]. His peripheral vision is worsening. He has optic nerve damage. Eye specialist is Dr [name].  He is HOH. He wears a cochlear implant on left. He has issues with hearing in loud places.   neuropathy of b/l toes with hyperglycemia - Followed by podiatry. Hx of Foot ulcer.  Went to wound center in December and January due to pressure ulcer to toe- has resolved now.    Prediabetic- stable. A1c 5.9% in october. Eats well. Diabetic diet.   GERD - stable on protonix. Controlled symptoms.   Constipation-occasionally will go 3-4 days without BM, will give PRN medications. Now going ever 2-3 days.    Hyperlipidemia - stable on lipitor.  LDL 71. Healthy diet.   HTN - BP stable on previous visit, on losartan, amlodipine.  Neurogenic bladder/urinary retention - foley cath is chronic.  He has recurrent UTI- none recently. Followed by Dr [name]. He had suprapubic tube exchanged by daughter due to COVID-19.  He is taking cranberry po daily. Last cystourethroscopy by Dr [name] 04/21/17. He continues to use irrigation. Marland Kitchen   GAD/insomnia - mood stable on celexa. Takes trazodone qhs to help sleep but feels dose could be a little stronger. He also takes melatonin  COPD - stable on Breo. No shortness of breath. No official diagnosis but well controlled. He has increased post nasal drip since med changed. He does not take an antihistamine  Review of Systems:  Review of Systems  Constitutional:  Negative for chills, fever and weight loss.  HENT: Negative for tinnitus.   Respiratory: Negative for cough, sputum production and shortness of breath.   Cardiovascular: Negative for chest pain, palpitations and leg swelling.  Gastrointestinal: Negative for abdominal pain, constipation, diarrhea and heartburn.  Genitourinary: Negative for dysuria, frequency and urgency.  Musculoskeletal: Negative for back pain, falls, joint pain and myalgias.  Skin: Negative.   Neurological: Positive for tingling and sensory change. Negative for dizziness and headaches.  Psychiatric/Behavioral: Negative for depression and memory loss. The patient does not have insomnia.     Past Medical History:  Diagnosis Date  . Anxiety   . Arthritis    neck   . 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 Cancer [facility]) 1956   left testicle  . COPD (chronic obstructive pulmonary disease) ([place])   . 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 Eczema   . Enlarged prostate   . High cholesterol   . Hypertension   . Loss of balance    due to meningitis  . Meningitis   . Neurogenic bladder    History of  . Numbness    in feet due to meningitis   . Rotator cuff tear    right  . Skin cancer   . Stroke [facility])    mini strokes in hospital with meningitis  . Unable to walk    due to meningitis   Past Surgical History:  Procedure Laterality Date  . APPENDECTOMY    . CATARACT EXTRACTION Bilateral 03/05/2014  . COCHLEAR IMPLANT    . CYSTOSCOPY WITH LITHOLAPAXY N/A 05/14/2014   Procedure: CYSTOSCOPY WITH LITHOLAPAXY;  Surgeon: [name], MD;  [facility];  Service: Urology;  Laterality: N/A;  . CYSTOSCOPY WITH LITHOLAPAXY N/A 04/22/2015   Procedure: CYSTOSCOPY WITH LITHOLAPAXY;  Surgeon: [name], MD;  [facility];  Service: Urology;  Laterality: N/A;  . INSERTION OF SUPRAPUBIC CATHETER N/A 05/14/2014   Procedure: INSERTION OF SUPRAPUBIC CATHETER;  Surgeon: [name], MD;  [facility];  Service: Urology;   Laterality: N/A;  . INSERTION OF SUPRAPUBIC CATHETER N/A 04/22/2015   Procedure: INSERTION OF SUPRAPUBIC CATHETER;  Surgeon: [name], MD;  [facility];  Service: Urology;  Laterality: N/A;  . PROSTATE SURGERY     biopsy x 2  . TESTICLE REMOVAL Left    October 1956  . TONSILLECTOMY     childhood   Social History:   reports that he quit smoking about 53 years ago. His smoking use included cigarettes. He started smoking about 63 years ago. He quit after 10.00 years of use. He has never used smokeless tobacco. He reports that he does not drink alcohol or use drugs.  Family History  Problem Relation Age of Onset  . Stroke Mother   . Heart failure Father     Medications: Patient's Medications  New Prescriptions   ZOSTER VACCINE ADJUVANTED (SHINGRIX) INJECTION    Inject 0.5 mLs into the muscle once  for 1 dose.  Previous Medications   AMLODIPINE (NORVASC) 5 MG TABLET    Take 1 tablet (5 mg total) by mouth daily.   ATORVASTATIN (LIPITOR) 10 MG TABLET    TAKE 1 TABLET (10 MG TOTAL) BY MOUTH AT BEDTIME.   CALCIUM CARBONATE-VIT D-MIN (CALCIUM 1200 PO)    Take 1 tablet by mouth daily.   CITALOPRAM (CELEXA) 20 MG TABLET    TAKE ONE TABLET BY MOUTH ONCE DAILY FOR ANXIETY   FLUTICASONE FUROATE-VILANTEROL (BREO ELLIPTA) 100-25 MCG/INH AEPB    TAKE 1 PUFF BY MOUTH EVERY DAY   LACTOBACILLUS (ACIDOPHILUS) 100 MG CAPS    Take 100 mg by mouth every morning.    LOSARTAN (COZAAR) 25 MG TABLET    TAKE 1 TABLET BY MOUTH ONCE DAILY TO CONTROL BLOOD PRESSURE   MAGNESIUM 500 MG CAPS    Take 1 capsule by mouth daily.   MAGNESIUM HYDROXIDE (MILK OF MAGNESIA) 400 MG/5ML SUSPENSION    Take 15 mLs by mouth daily as needed for mild constipation.   MELATONIN 10 MG TABS    Take 10 mg by mouth at bedtime.    PANTOPRAZOLE (PROTONIX) 40 MG TABLET    TAKE ONE TABLET BY MOUTH ONCE DAILY FOR STOMACH   SODIUM CHLORIDE IRRIGATION 0.9 % IRRIGATION    Bladder wash   TRAZODONE (DESYREL) 100 MG TABLET    TAKE 1  TABLET BY MOUTH EVERYDAY AT BEDTIME   TRIAMCINOLONE CREAM (KENALOG) 0.1 %    Apply 1 application topically daily.   UNABLE TO FIND    Med Name: Renacidan irrigate 2 times daily.  Modified Medications   No medications on file  Discontinued Medications   No medications on file     Physical Exam: unable due to televisit.     Labs reviewed: Basic Metabolic Panel: Recent Labs    11/26/17 1100  NA 134*  K 4.7  CL 98  CO2 27  GLUCOSE 93  BUN 16  CREATININE 1.08  CALCIUM 9.4  TSH 0.89   Liver Function Tests: Recent Labs    11/26/17 1100  AST 15  ALT 12  BILITOT 0.4  PROT 6.8   No results for input(s): LIPASE, AMYLASE in the last 8760 hours. No results for input(s): AMMONIA in the last 8760 hours. CBC: Recent Labs    11/26/17 1100  WBC 9.7  NEUTROABS 6,615  HGB 14.1  HCT 41.1  MCV 90.1  PLT 235   Lipid Panel: Recent Labs    11/26/17 1100  CHOL 158  HDL 70  LDLCALC 71  TRIG 91  CHOLHDL 2.3   TSH: Recent Labs    11/26/17 1100  TSH 0.89   A1C: Lab Results  Component Value Date   HGBA1C 5.9 (H) 11/26/2017     Assessment/Plan 1. Idiopathic peripheral neuropathy Worsening neuropathy at night, will wake him up during the middle of the night and has a hard time getting back to sleep with it.  - gabapentin (NEURONTIN) 100 MG capsule; Take 1 capsule (100 mg total) by mouth at bedtime.  Dispense: 90 capsule; Refill: 1  2. Suprapubic catheter [facility]) Continues with close urology follow up. Daughter now exchanging and flushing catheter due to COVID-19.  3. Constipation, unspecified constipation type Controlled on diet and occasionally will need OTC  4. Mood disorder (HCC) Stable on celexa.   5. Hyperglycemia A1c stable on lab in October, daughter reports he maintains healthy diet.   6. Essential hypertension Generally very well controlled. Continues on  losartan 25 mg daily and norvasc  7. Other emphysema ([place]) Stable, controlled on Breo   8.  Mixed hyperlipidemia LDL controlled on lipitor 10 mg daily   9. Gastroesophageal reflux disease without esophagitis Controlled on protonix. Continue this with dietary modification.   Next appt: 11/03/2018 Carlos American. Harle Battiest  [facility] [phone]   Follow Up Instructions:    I discussed the assessment and treatment plan with the patient. The patient was provided an opportunity to ask questions and all were answered. The patient agreed with the plan and demonstrated an understanding of the instructions.   The patient was advised to call back or seek an in-person evaluation if the symptoms worsen or if the condition fails to improve as anticipated.  I provided 25 minutes of non-face-to-face time during this encounter.  avs printed and mailed. Lauree Chandler, NP

## 2018-06-08 ENCOUNTER — Telehealth: Payer: Self-pay | Admitting: Urology

## 2018-06-08 NOTE — Telephone Encounter (Signed)
Spoke to patient's daughter [name]) he has been scheduled.

## 2018-06-08 NOTE — Telephone Encounter (Signed)
Riley [name] is calling to set up an appointment for 1 year follow up for neurogenic bladder.    Patient is requesting appointment to be scheduled between 6/15-6/19. Patient will be in town on those dates.    Writer could not schedule due to templet not being available.     Please call patient back at [phone] to schedule.

## 2018-06-20 ENCOUNTER — Telehealth: Payer: Self-pay | Admitting: *Deleted

## 2018-06-20 NOTE — Telephone Encounter (Signed)
It most likely is, have him stop medication at this time and see if the swelling goes down.

## 2018-06-20 NOTE — Telephone Encounter (Signed)
Ricardo [name] called and stated that Ricardo [name] Prescribed patient Ricardo [name] a couple of weeks ago and now patient is having Ricardo [name] swelling for about 7-8 days now and wonders if it is from the medication. Please Advise.

## 2018-06-20 NOTE — Telephone Encounter (Signed)
Ricardo [name] notified and agreed.

## 2018-06-20 NOTE — Addendum Note (Signed)
Addended by: Rafael Bihari A on: 06/20/2018 01:15 PM   Modules accepted: Orders

## 2018-06-24 DIAGNOSIS — R338 Other retention of urine: Secondary | ICD-10-CM | POA: Diagnosis not present

## 2018-06-24 DIAGNOSIS — Z936 Other artificial openings of urinary tract status: Secondary | ICD-10-CM | POA: Diagnosis not present

## 2018-06-24 DIAGNOSIS — N138 Other obstructive and reflux uropathy: Secondary | ICD-10-CM | POA: Diagnosis not present

## 2018-06-24 DIAGNOSIS — N401 Enlarged prostate with lower urinary tract symptoms: Secondary | ICD-10-CM | POA: Diagnosis not present

## 2018-06-24 DIAGNOSIS — N3020 Other chronic cystitis without hematuria: Secondary | ICD-10-CM | POA: Diagnosis not present

## 2018-06-28 DIAGNOSIS — R339 Retention of urine, unspecified: Secondary | ICD-10-CM | POA: Diagnosis not present

## 2018-06-28 DIAGNOSIS — Z466 Encounter for fitting and adjustment of urinary device: Secondary | ICD-10-CM | POA: Diagnosis not present

## 2018-06-28 NOTE — Telephone Encounter (Signed)
Daughter, [name] called and stated that patient is still having alittle edema on the top of his Left Foot, but since stopping the Gabapentin it has gotten better. Daughter is wondering if there is something else they can do. Please Advise.

## 2018-06-28 NOTE — Telephone Encounter (Signed)
Glad this is better, would recommended elevation throughout the day as tolerates, can use compression hose during the day and low sodium diet.

## 2018-06-28 NOTE — Telephone Encounter (Signed)
Ricardo [name], daughter notified and agreed.

## 2018-07-19 ENCOUNTER — Telehealth: Payer: Self-pay | Admitting: Urology

## 2018-07-19 ENCOUNTER — Ambulatory Visit: Payer: Medicare (Managed Care) | Admitting: Urology

## 2018-07-19 ENCOUNTER — Encounter: Payer: Self-pay | Admitting: Urology

## 2018-07-19 DIAGNOSIS — N319 Neuromuscular dysfunction of bladder, unspecified: Secondary | ICD-10-CM

## 2018-07-19 NOTE — Telephone Encounter (Signed)
Spoke to [employer] NP, she said Telephone is ok, changed in system.

## 2018-07-19 NOTE — Telephone Encounter (Signed)
Copied from [place] [phone]. Topic: Appointments - Appointment Information  >> Jul 19, 2018 10:35 AM Harriette Bouillon wrote:  Gwen Her Mefferd daughter [name] is calling to verify if appointment is able to be telemedicine-over the phone- Derrek Monaco at 1:00.  This Probation officer spoke to office- [name] will receive a call. [name] states that patient is non ambulatory so telemedicine would be easier. Patient lives 1 hour away so if she could please be called before noon. Thank you    Best number to reach [name] [phone]

## 2018-07-19 NOTE — Progress Notes (Signed)
Telephone Visit     This is an established patient visit.    Reason for visit: Neurogenic bladder    Chart review started at 07/19/18 at 12:36 PM    Call placed to patient at 07/19/18 at 12:37 PM    HPI:  Riley [name] is a 83 y.o. male  PMH significant for COPD, asthma, testicular cancer, prediabetes, HTN, HLD, meningitis, who is being evaluated for neurogenic bladder following CVA in 2015, currently managed via suprapubic catheter x5 years    SPT is exchanged by his daughter and by [facility] nurses. Cath changes occur every 3 weeks without issue. He does Renacidin every morning and every other evening. Occasional blood in small amounts is noted in catheter bag with catheter changes. No issues with UTI for at least 2 years. No leakage from his urethra.    Patient is legally deaf and legally blind. His daughter Riley [name] has supplemented much of the history. She is his primary care taker. Patient lives full time in [place].    Patient's problem list, allergies, and medications were reviewed and updated as appropriate. Please see the EHR for full details.    Assessment: CARRY ORTEZ is a 83 y.o. male with NGB 2/2 CVA, managed via chronic SPT. Doing well.     Plan:   Continue cath changes every 3 weeks and as needed for obstruction. Continue renacidin instillations 1-2x daily. Discussed vitamin D supplementation, encourage to review with PCP. Patient will call us with issues.    The plan was discussed with the patient and the patient/patient rep demonstrated understanding to the provider's satisfaction.    Consent was previously obtained from the patient to complete this telephone consult; including the potential for financial liability.    11-20 minutes was spent reviewing the EMR and management of this patient.     Riley Latino, NP 07/19/2018 12:36 PM    Patient uses [facility] [phone] for cath changes fax [phone].  Home Urologist is Dr. Consuello Masse.

## 2018-07-25 ENCOUNTER — Telehealth: Payer: Self-pay | Admitting: Urology

## 2018-07-25 NOTE — Telephone Encounter (Signed)
Copied from [place] [phone]. Topic: Access to Care - Speak to Provider/Office Staff  >> Jul 25, 2018 11:45 AM Marguerita Beards wrote:  jennifer nurse from [facility] is calling to ask when the order/referral for cath changes will be sent - fax [phone]  She can be reached at [phone] ask for jennifer r

## 2018-07-26 NOTE — Telephone Encounter (Signed)
*   PLEASE SEE ENCOUNTER BELOW Anderson Malta with [facility] is calling back on behalf of Riley [name]      She is calling to check the status of [name]'s home health care order.     Anderson Malta is requested the order be faxed to [phone]    And she can be reached at [phone] ask for [facility].

## 2018-07-27 NOTE — Telephone Encounter (Signed)
[place],   Do you know if the referral for home care ever got sent?  Thank you, Janett Billow

## 2018-07-28 NOTE — Telephone Encounter (Signed)
I have not received it. It might be with Maureen's things.     Riley [name], have you seen the orders?

## 2018-07-28 NOTE — Telephone Encounter (Signed)
Riley [name],     Just an FYI I spoke to [facility] and they will be dropping referral so when visiting nurse sees patient next week they will then fax orders over for signature.

## 2018-08-02 DIAGNOSIS — I10 Essential (primary) hypertension: Secondary | ICD-10-CM | POA: Diagnosis not present

## 2018-08-02 DIAGNOSIS — H548 Legal blindness, as defined in USA: Secondary | ICD-10-CM | POA: Diagnosis not present

## 2018-08-02 DIAGNOSIS — Z435 Encounter for attention to cystostomy: Secondary | ICD-10-CM | POA: Diagnosis not present

## 2018-08-02 DIAGNOSIS — J449 Chronic obstructive pulmonary disease, unspecified: Secondary | ICD-10-CM | POA: Diagnosis not present

## 2018-08-02 DIAGNOSIS — Z8547 Personal history of malignant neoplasm of testis: Secondary | ICD-10-CM | POA: Diagnosis not present

## 2018-08-02 DIAGNOSIS — Z9079 Acquired absence of other genital organ(s): Secondary | ICD-10-CM | POA: Diagnosis not present

## 2018-08-02 DIAGNOSIS — Z7951 Long term (current) use of inhaled steroids: Secondary | ICD-10-CM | POA: Diagnosis not present

## 2018-08-02 DIAGNOSIS — Z87440 Personal history of urinary (tract) infections: Secondary | ICD-10-CM | POA: Diagnosis not present

## 2018-08-02 DIAGNOSIS — N319 Neuromuscular dysfunction of bladder, unspecified: Secondary | ICD-10-CM | POA: Diagnosis not present

## 2018-08-02 NOTE — Telephone Encounter (Signed)
Copied from [place] [phone]. Topic: Access to Care - Speak to Provider/Office Staff  >> Aug 02, 2018  1:27 PM Riley [name], Lorain Childes A wrote:  [name] is calling to advise patient is being opened to home care and office will receive a fax. She states that the flushing of suprapubic catheter will be 30-60 ml of normal saline everyday and as needed for blockage and Renacidin irrigation solution 60 ml everyday and as needed for blockages as well.    She also states there is a  drug interaction citalopram (CELEXA) 20 MG tablet  and Aleve  (over the counter as needed) patient takes a few times a month.     She can be reached at [phone] to advise.

## 2018-08-02 NOTE — Telephone Encounter (Signed)
Okay sounds good. She should inform the patient's PCP about the drug interaction she found. Thanks!

## 2018-08-09 DIAGNOSIS — J449 Chronic obstructive pulmonary disease, unspecified: Secondary | ICD-10-CM | POA: Diagnosis not present

## 2018-08-09 DIAGNOSIS — H548 Legal blindness, as defined in USA: Secondary | ICD-10-CM | POA: Diagnosis not present

## 2018-08-09 DIAGNOSIS — Z87440 Personal history of urinary (tract) infections: Secondary | ICD-10-CM | POA: Diagnosis not present

## 2018-08-09 DIAGNOSIS — I10 Essential (primary) hypertension: Secondary | ICD-10-CM | POA: Diagnosis not present

## 2018-08-09 DIAGNOSIS — Z7951 Long term (current) use of inhaled steroids: Secondary | ICD-10-CM | POA: Diagnosis not present

## 2018-08-09 DIAGNOSIS — Z8547 Personal history of malignant neoplasm of testis: Secondary | ICD-10-CM | POA: Diagnosis not present

## 2018-08-09 DIAGNOSIS — Z9079 Acquired absence of other genital organ(s): Secondary | ICD-10-CM | POA: Diagnosis not present

## 2018-08-09 DIAGNOSIS — Z435 Encounter for attention to cystostomy: Secondary | ICD-10-CM | POA: Diagnosis not present

## 2018-08-09 DIAGNOSIS — N319 Neuromuscular dysfunction of bladder, unspecified: Secondary | ICD-10-CM | POA: Diagnosis not present

## 2018-08-15 ENCOUNTER — Other Ambulatory Visit: Payer: Self-pay | Admitting: Emergency Medicine

## 2018-08-15 DIAGNOSIS — N1339 Other hydronephrosis: Secondary | ICD-10-CM

## 2018-08-15 DIAGNOSIS — N400 Enlarged prostate without lower urinary tract symptoms: Secondary | ICD-10-CM

## 2018-08-15 DIAGNOSIS — R52 Pain, unspecified: Secondary | ICD-10-CM | POA: Diagnosis not present

## 2018-08-15 DIAGNOSIS — N1330 Unspecified hydronephrosis: Secondary | ICD-10-CM | POA: Diagnosis not present

## 2018-08-15 DIAGNOSIS — N3000 Acute cystitis without hematuria: Secondary | ICD-10-CM | POA: Diagnosis not present

## 2018-08-15 NOTE — Telephone Encounter (Signed)
Copied from [place] [phone]. Topic: Appointments - Schedule Appointment  >> Aug 15, 2018  3:49 PM Maureen Chatters A wrote:  Mr. Shatto daughter [name] is calling to schedule an appointment with [facility]. The patient would like to be seen for discharge from hospital-new medication. Lizette scheduled -Friday 7/17 -8:00 with Shawna H[place]. Call back number to [name] [phone].

## 2018-08-18 ENCOUNTER — Encounter: Payer: Self-pay | Admitting: Gastroenterology

## 2018-08-18 MED ORDER — OXYBUTYNIN CHLORIDE 10 MG PO TB24 *I*
10.0000 mg | ORAL_TABLET | Freq: Every day | ORAL | 11 refills | Status: DC
Start: 2018-08-18 — End: 2018-09-12

## 2018-08-19 ENCOUNTER — Ambulatory Visit: Payer: Medicare (Managed Care) | Admitting: Urology

## 2018-08-19 ENCOUNTER — Encounter: Payer: Self-pay | Admitting: Urology

## 2018-08-19 NOTE — Progress Notes (Signed)
Patient was not seen in clinic today. He is home and doing well. Requested Oxybutynin, this was prescribed. Daughter will keep [place] posted with any issues.

## 2018-08-23 ENCOUNTER — Other Ambulatory Visit: Payer: Self-pay | Admitting: *Deleted

## 2018-08-23 DIAGNOSIS — J438 Other emphysema: Secondary | ICD-10-CM

## 2018-08-23 MED ORDER — BREO ELLIPTA 100-25 MCG/INH IN AEPB: INHALATION_SPRAY | RESPIRATORY_TRACT | 1 refills | Status: DC

## 2018-08-23 NOTE — Telephone Encounter (Signed)
[facility]

## 2018-09-01 DIAGNOSIS — Z7951 Long term (current) use of inhaled steroids: Secondary | ICD-10-CM | POA: Diagnosis not present

## 2018-09-01 DIAGNOSIS — J449 Chronic obstructive pulmonary disease, unspecified: Secondary | ICD-10-CM | POA: Diagnosis not present

## 2018-09-01 DIAGNOSIS — H548 Legal blindness, as defined in USA: Secondary | ICD-10-CM | POA: Diagnosis not present

## 2018-09-01 DIAGNOSIS — Z8547 Personal history of malignant neoplasm of testis: Secondary | ICD-10-CM | POA: Diagnosis not present

## 2018-09-01 DIAGNOSIS — I10 Essential (primary) hypertension: Secondary | ICD-10-CM | POA: Diagnosis not present

## 2018-09-01 DIAGNOSIS — Z87440 Personal history of urinary (tract) infections: Secondary | ICD-10-CM | POA: Diagnosis not present

## 2018-09-01 DIAGNOSIS — N319 Neuromuscular dysfunction of bladder, unspecified: Secondary | ICD-10-CM | POA: Diagnosis not present

## 2018-09-01 DIAGNOSIS — Z9079 Acquired absence of other genital organ(s): Secondary | ICD-10-CM | POA: Diagnosis not present

## 2018-09-01 DIAGNOSIS — Z435 Encounter for attention to cystostomy: Secondary | ICD-10-CM | POA: Diagnosis not present

## 2018-09-04 ENCOUNTER — Other Ambulatory Visit: Payer: Self-pay | Admitting: Nurse Practitioner

## 2018-09-04 DIAGNOSIS — I10 Essential (primary) hypertension: Secondary | ICD-10-CM

## 2018-09-05 ENCOUNTER — Encounter: Payer: Self-pay | Admitting: Gastroenterology

## 2018-09-05 ENCOUNTER — Telehealth: Payer: Self-pay | Admitting: Urology

## 2018-09-05 DIAGNOSIS — Z9079 Acquired absence of other genital organ(s): Secondary | ICD-10-CM | POA: Diagnosis not present

## 2018-09-05 DIAGNOSIS — I10 Essential (primary) hypertension: Secondary | ICD-10-CM | POA: Diagnosis not present

## 2018-09-05 DIAGNOSIS — Z435 Encounter for attention to cystostomy: Secondary | ICD-10-CM | POA: Diagnosis not present

## 2018-09-05 DIAGNOSIS — H548 Legal blindness, as defined in USA: Secondary | ICD-10-CM | POA: Diagnosis not present

## 2018-09-05 DIAGNOSIS — Z8547 Personal history of malignant neoplasm of testis: Secondary | ICD-10-CM | POA: Diagnosis not present

## 2018-09-05 DIAGNOSIS — Z87440 Personal history of urinary (tract) infections: Secondary | ICD-10-CM | POA: Diagnosis not present

## 2018-09-05 DIAGNOSIS — N319 Neuromuscular dysfunction of bladder, unspecified: Secondary | ICD-10-CM | POA: Diagnosis not present

## 2018-09-05 DIAGNOSIS — J449 Chronic obstructive pulmonary disease, unspecified: Secondary | ICD-10-CM | POA: Diagnosis not present

## 2018-09-05 DIAGNOSIS — Z7951 Long term (current) use of inhaled steroids: Secondary | ICD-10-CM | POA: Diagnosis not present

## 2018-09-05 NOTE — Telephone Encounter (Signed)
Copied from [place] [phone]. Topic: Access to Care - Speak to Provider/Office Staff  >> Sep 05, 2018  1:13 PM Lajuana Carry wrote:  Anderson Malta from [facility] called in regards to Mr. Hallenbeck SP catheter change and that went very well Anderson Malta stated. Anderson Malta stated that  she plans on discharging patient from [place] next week.    Anderson Malta can be reached at [phone]

## 2018-09-09 ENCOUNTER — Other Ambulatory Visit: Payer: Self-pay | Admitting: *Deleted

## 2018-09-09 DIAGNOSIS — G4700 Insomnia, unspecified: Secondary | ICD-10-CM

## 2018-09-09 MED ORDER — PANTOPRAZOLE SODIUM 40 MG PO TBEC: DELAYED_RELEASE_TABLET | ORAL | 1 refills | Status: DC

## 2018-09-09 MED ORDER — TRAZODONE HCL 100 MG PO TABS: ORAL_TABLET | ORAL | 1 refills | Status: DC

## 2018-09-09 NOTE — Telephone Encounter (Signed)
[facility] Rx and sent to [place] for approval due to [place].

## 2018-09-12 ENCOUNTER — Telehealth: Payer: Self-pay | Admitting: Urology

## 2018-09-12 ENCOUNTER — Encounter: Payer: Self-pay | Admitting: Gastroenterology

## 2018-09-12 ENCOUNTER — Other Ambulatory Visit: Payer: Self-pay | Admitting: Urology

## 2018-09-12 DIAGNOSIS — Z7951 Long term (current) use of inhaled steroids: Secondary | ICD-10-CM | POA: Diagnosis not present

## 2018-09-12 DIAGNOSIS — Z9079 Acquired absence of other genital organ(s): Secondary | ICD-10-CM | POA: Diagnosis not present

## 2018-09-12 DIAGNOSIS — N319 Neuromuscular dysfunction of bladder, unspecified: Secondary | ICD-10-CM | POA: Diagnosis not present

## 2018-09-12 DIAGNOSIS — H548 Legal blindness, as defined in USA: Secondary | ICD-10-CM | POA: Diagnosis not present

## 2018-09-12 DIAGNOSIS — J449 Chronic obstructive pulmonary disease, unspecified: Secondary | ICD-10-CM | POA: Diagnosis not present

## 2018-09-12 DIAGNOSIS — Z435 Encounter for attention to cystostomy: Secondary | ICD-10-CM | POA: Diagnosis not present

## 2018-09-12 DIAGNOSIS — Z8547 Personal history of malignant neoplasm of testis: Secondary | ICD-10-CM | POA: Diagnosis not present

## 2018-09-12 DIAGNOSIS — Z87440 Personal history of urinary (tract) infections: Secondary | ICD-10-CM | POA: Diagnosis not present

## 2018-09-12 DIAGNOSIS — I10 Essential (primary) hypertension: Secondary | ICD-10-CM | POA: Diagnosis not present

## 2018-09-12 MED ORDER — OXYBUTYNIN CHLORIDE 10 MG PO TB24 *I*
10.0000 mg | ORAL_TABLET | Freq: Every day | ORAL | 11 refills | Status: DC
Start: 2018-09-12 — End: 2020-08-21

## 2018-09-12 NOTE — Telephone Encounter (Signed)
Copied from [place] [phone]. Topic: Access to Care - Speak to Provider/Office Staff  >> Sep 12, 2018  3:05 PM Penni Bombard wrote:  Anderson Malta with [facility] called to states DIYARI CHERNE is being [facility]. Marc Leichter Mayall fell down yesterday and bruise on his left forearm and a small abrasion on the left side of his head. Anderson Malta can be reached at [phone] if necessary

## 2018-09-27 DIAGNOSIS — R339 Retention of urine, unspecified: Secondary | ICD-10-CM | POA: Diagnosis not present

## 2018-09-28 ENCOUNTER — Ambulatory Visit (INDEPENDENT_AMBULATORY_CARE_PROVIDER_SITE_OTHER): Payer: Medicare HMO | Admitting: Nurse Practitioner

## 2018-09-28 ENCOUNTER — Encounter: Payer: Self-pay | Admitting: Nurse Practitioner

## 2018-09-28 ENCOUNTER — Other Ambulatory Visit: Payer: Self-pay

## 2018-09-28 VITALS — BP 116/62 | HR 69 | Temp 98.1°F

## 2018-09-28 DIAGNOSIS — Z23 Encounter for immunization: Secondary | ICD-10-CM | POA: Diagnosis not present

## 2018-09-28 DIAGNOSIS — G609 Hereditary and idiopathic neuropathy, unspecified: Secondary | ICD-10-CM

## 2018-09-28 DIAGNOSIS — R739 Hyperglycemia, unspecified: Secondary | ICD-10-CM | POA: Diagnosis not present

## 2018-09-28 MED ORDER — PREGABALIN 25 MG PO CAPS: 25.0000 mg | ORAL_CAPSULE | Freq: Every day | ORAL | 1 refills | Status: DC

## 2018-09-28 NOTE — Patient Instructions (Addendum)
To stretch legs after activities and before bed To use muscle rubs prior to bed  To start lyrica 25 mg by mouth daily at bedtime.

## 2018-09-28 NOTE — Progress Notes (Signed)
Careteam: Patient Care Team: Lauree Chandler, NP as PCP - General (Geriatric Medicine) [name], MD (Inactive) as Consulting Physician (Urology)  Advanced Directive information Does Patient Have a Medical Advance Directive?: Yes, Type of Advance Directive: [place];Living will, Does patient want to make changes to medical advance directive?: No - Patient declined  Allergies  Allergen Reactions  . Peanut Oil Shortness Of Breath, Itching and Swelling    Swelling of the throat   . Peanut-Containing Drug Products Shortness Of Breath and Swelling    Swelling of the throat     Chief Complaint  Patient presents with  . Acute Visit    Neuropathy  . Immunizations    Flu vaccine today. Discuss need for TDaP and PNA     HPI: Patient is a 84 y.o. male seen in the office today due to neuropathy. Nerve conduction done in December 2019; Studying noted sensorimotor polyneuropathy.  He was started on gabapentin in April but due to worsening swelling to legs this was stopped.     Been very bad over the last few months but worse over the last few weeks.  Having a pain, "bad feeling" that wakes him up and he has to stretch and shake the legs.  Gabapentin helped significantly with symptoms but caused swelling.  Reports area is sore/cramp/ache feeling.  Has it in his thighs (occasionally) but mostly in calves and feet.  Has tired taking aleve but still wakes up with the same feeling.  No low back pain.  Walks with walker with assistance with his aides, twice weekly. Able to walk and pain/sensation improves during the day. He he able to go to bed without problems.  Only issues is it wakes him up in the middle of the night  Review of Systems:  Review of Systems  Constitutional: Negative for chills, fever and weight loss.  HENT: Negative for tinnitus.   Respiratory: Negative for sputum production.   Cardiovascular: Negative for chest pain.   Gastrointestinal: Negative for abdominal pain and heartburn.  Musculoskeletal: Positive for myalgias (lower legs). Negative for back pain, falls and joint pain.  Skin: Negative.   Neurological: Positive for tingling and sensory change. Negative for dizziness and headaches.    Past Medical History:  Diagnosis Date  . Anxiety   . Arthritis    neck   . 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 Cancer [facility]) 1956   left testicle  . COPD (chronic obstructive pulmonary disease) ([place])   . 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 Eczema   . Enlarged prostate   . High cholesterol   . Hypertension   . Loss of balance    due to meningitis  . Meningitis   . Neurogenic bladder    History of  . Numbness    in feet due to meningitis   . Rotator cuff tear    right  . Skin cancer   . Stroke [facility])    mini strokes in hospital with meningitis  . Unable to walk    due to meningitis   Past Surgical History:  Procedure Laterality Date  . APPENDECTOMY    . CATARACT EXTRACTION Bilateral 03/05/2014  . COCHLEAR IMPLANT    . CYSTOSCOPY WITH LITHOLAPAXY N/A 05/14/2014   Procedure: CYSTOSCOPY WITH LITHOLAPAXY;  Surgeon: [name], MD;  Location: [facility];  Service: Urology;  Laterality: N/A;  . CYSTOSCOPY WITH LITHOLAPAXY N/A 04/22/2015   Procedure: CYSTOSCOPY WITH LITHOLAPAXY;  Surgeon: [name], MD;  Location: [facility];  Service: Urology;  Laterality: N/A;  . INSERTION OF SUPRAPUBIC CATHETER N/A 05/14/2014   Procedure: INSERTION OF SUPRAPUBIC CATHETER;  Surgeon: [name], MD;  Location: [facility];  Service: Urology;  Laterality: N/A;  . INSERTION OF SUPRAPUBIC CATHETER N/A 04/22/2015   Procedure: INSERTION OF SUPRAPUBIC CATHETER;  Surgeon: [name], MD;  Location: [facility];  Service: Urology;  Laterality: N/A;  . PROSTATE SURGERY     biopsy x 2  . TESTICLE REMOVAL Left    October 1956  . TONSILLECTOMY     childhood   Social History:   reports that he quit smoking about 53 years ago.  His smoking use included cigarettes. He started smoking about 63 years ago. He quit after 10.00 years of use. He has never used smokeless tobacco. He reports current alcohol use. He reports that he does not use drugs.  Family History  Problem Relation Age of Onset  . Stroke Mother   . Heart failure Father     Medications: Patient's Medications  New Prescriptions   No medications on file  Previous Medications   AMLODIPINE (NORVASC) 5 MG TABLET    TAKE 1 TABLET BY MOUTH EVERY DAY   ATORVASTATIN (LIPITOR) 10 MG TABLET    TAKE 1 TABLET (10 MG TOTAL) BY MOUTH AT BEDTIME.   CALCIUM CARBONATE-VIT D-MIN (CALCIUM 1200 PO)    Take 1 tablet by mouth daily.   CITALOPRAM (CELEXA) 20 MG TABLET    TAKE ONE TABLET BY MOUTH ONCE DAILY FOR ANXIETY   FLUTICASONE FUROATE-VILANTEROL (BREO ELLIPTA) 100-25 MCG/INH AEPB    TAKE 1 PUFF BY MOUTH EVERY DAY   LACTOBACILLUS (ACIDOPHILUS) 100 MG CAPS    Take 100 mg by mouth every morning.    LOSARTAN (COZAAR) 25 MG TABLET    TAKE 1 TABLET BY MOUTH ONCE DAILY TO CONTROL BLOOD PRESSURE   MAGNESIUM 500 MG CAPS    Take 1 capsule by mouth daily.   MAGNESIUM HYDROXIDE (MILK OF MAGNESIA) 400 MG/5ML SUSPENSION    Take 15 mLs by mouth daily as needed for mild constipation.   MELATONIN 10 MG TABS    Take 10 mg by mouth at bedtime.    PANTOPRAZOLE (PROTONIX) 40 MG TABLET    TAKE ONE TABLET BY MOUTH ONCE DAILY FOR STOMACH   SODIUM CHLORIDE IRRIGATION 0.9 % IRRIGATION    Bladder wash   TRAZODONE (DESYREL) 100 MG TABLET    TAKE 1 TABLET BY MOUTH EVERYDAY AT BEDTIME   TRIAMCINOLONE CREAM (KENALOG) 0.1 %    Apply 1 application topically daily.   UNABLE TO FIND    Med Name: Renacidan irrigate 2 times daily.  Modified Medications   No medications on file  Discontinued Medications   No medications on file    Physical Exam:  Vitals:   09/28/18 1307  BP: 116/62  Pulse: 69  Temp: 98.1 F (36.7 C)  TempSrc: Oral  SpO2: 98%   There is no height or weight on file to calculate  BMI. Wt Readings from Last 3 Encounters:  03/09/17 185 lb 12.8 oz (84.3 kg)  09/25/16 186 lb (84.4 kg)  03/20/16 192 lb 12.8 oz (87.5 kg)    Physical Exam Constitutional:      Appearance: He is well-developed.  Eyes:     General: No scleral icterus.    Pupils: Pupils are equal, round, and reactive to light.  Neck:     Musculoskeletal: Neck supple.  Thyroid: No thyromegaly.     Vascular: No carotid bruit.  Cardiovascular:     Rate and Rhythm: Normal rate and regular rhythm.     Heart sounds: Murmur (1/6 SEM) present. No friction rub. No gallop.      Comments: no distal LE swelling. No calf TTP Pulmonary:     Effort: Pulmonary effort is normal.     Breath sounds: Normal breath sounds. No wheezing or rales.  Chest:     Chest wall: No tenderness.  Abdominal:     General: There is no abdominal bruit.     Palpations: There is no hepatomegaly or pulsatile mass.     Tenderness: There is no guarding or rebound.  Genitourinary:    Comments: Foley cath intact and DTG yellow urine with min sediment Feet:     Right foot:     Protective Sensation: 4 sites tested. 3 sites sensed.     Left foot:     Protective Sensation: 4 sites tested. 3 sites sensed.  Lymphadenopathy:     Cervical: No cervical adenopathy.  Skin:    General: Skin is warm and dry.     Findings: No rash.  Neurological:     Mental Status: He is alert and oriented to person, place, and time.     Sensory: Sensory deficit present.     Gait: Gait abnormal.     Deep Tendon Reflexes: Reflexes are normal and symmetric.  Psychiatric:        Behavior: Behavior normal.        Thought Content: Thought content normal.        Judgment: Judgment normal.     Labs reviewed: Basic Metabolic Panel: Recent Labs    11/26/17 1100  NA 134*  K 4.7  CL 98  CO2 27  GLUCOSE 93  BUN 16  CREATININE 1.08  CALCIUM 9.4  TSH 0.89   Liver Function Tests: Recent Labs    11/26/17 1100  AST 15  ALT 12  BILITOT 0.4  PROT 6.8    No results for input(s): LIPASE, AMYLASE in the last 8760 hours. No results for input(s): AMMONIA in the last 8760 hours. CBC: Recent Labs    11/26/17 1100  WBC 9.7  NEUTROABS 6,615  HGB 14.1  HCT 41.1  MCV 90.1  PLT 235   Lipid Panel: Recent Labs    11/26/17 1100  CHOL 158  HDL 70  LDLCALC 71  TRIG 91  CHOLHDL 2.3   TSH: Recent Labs    11/26/17 1100  TSH 0.89   A1C: Lab Results  Component Value Date   HGBA1C 5.9 (H) 11/26/2017     Assessment/Plan 1. Need for influenza vaccination - Flu Vaccine QUAD High Dose(Fluad)  2. Idiopathic peripheral neuropathy -ongoing, worse early morning. Encouraged massage and stretching prior to bed. Gabapentin helped but caused worsen LE edema.  - B12 and Folate Panel - COMPLETE METABOLIC PANEL WITH GFR - CBC with Differential/Platelet - pregabalin (LYRICA) 25 MG capsule; Take 1 capsule (25 mg total) by mouth at bedtime.  Dispense: 30 capsule; Refill: 1  3. Hyperglycemia -attempts to limit sugar in diet.  - Hemoglobin A1c  Next appt: 11/03/2018, as scheduled.  Carlos American. Dress[place], [facility] [phone]

## 2018-09-29 LAB — CBC WITH DIFFERENTIAL/PLATELET
Absolute Monocytes: 989 cells/uL — ABNORMAL HIGH (ref 200–950)
Basophils Absolute: 39 cells/uL (ref 0–200)
Basophils Relative: 0.4 %
Eosinophils Absolute: 68 cells/uL (ref 15–500)
Eosinophils Relative: 0.7 %
HCT: 38.6 % (ref 38.5–50.0)
Hemoglobin: 12.7 g/dL — ABNORMAL LOW (ref 13.2–17.1)
Lymphs Abs: 1940 cells/uL (ref 850–3900)
MCH: 30.3 pg (ref 27.0–33.0)
MCHC: 32.9 g/dL (ref 32.0–36.0)
MCV: 92.1 fL (ref 80.0–100.0)
MPV: 9.7 fL (ref 7.5–12.5)
Monocytes Relative: 10.2 %
Neutro Abs: 6664 cells/uL (ref 1500–7800)
Neutrophils Relative %: 68.7 %
Platelets: 264 10*3/uL (ref 140–400)
RBC: 4.19 10*6/uL — ABNORMAL LOW (ref 4.20–5.80)
RDW: 12.7 % (ref 11.0–15.0)
Total Lymphocyte: 20 %
WBC: 9.7 10*3/uL (ref 3.8–10.8)

## 2018-09-29 LAB — COMPLETE METABOLIC PANEL WITH GFR
AG Ratio: 1.6 (calc) (ref 1.0–2.5)
ALT: 11 U/L (ref 9–46)
AST: 15 U/L (ref 10–35)
Albumin: 4.1 g/dL (ref 3.6–5.1)
Alkaline phosphatase (APISO): 64 U/L (ref 35–144)
BUN: 18 mg/dL (ref 7–25)
CO2: 26 mmol/L (ref 20–32)
Calcium: 9.1 mg/dL (ref 8.6–10.3)
Chloride: 99 mmol/L (ref 98–110)
Creat: 1.03 mg/dL (ref 0.70–1.11)
GFR, Est African American: 79 mL/min/{1.73_m2} (ref >=60)
GFR, Est Non African American: 68 mL/min/{1.73_m2} (ref >=60)
Globulin: 2.5 g/dL (calc) (ref 1.9–3.7)
Glucose, Bld: 108 mg/dL (ref 65–139)
Potassium: 4.8 mmol/L (ref 3.5–5.3)
Sodium: 133 mmol/L — ABNORMAL LOW (ref 135–146)
Total Bilirubin: 0.4 mg/dL (ref 0.2–1.2)
Total Protein: 6.6 g/dL (ref 6.1–8.1)

## 2018-09-29 LAB — HEMOGLOBIN A1C
Hgb A1c MFr Bld: 5.8 % of total Hgb — ABNORMAL HIGH (ref <5.7)
Mean Plasma Glucose: 120 (calc)
eAG (mmol/L): 6.6 (calc)

## 2018-09-29 LAB — B12 AND FOLATE PANEL
Folate: 9.1 ng/mL
Vitamin B-12: 370 pg/mL (ref 200–1100)

## 2018-10-05 ENCOUNTER — Telehealth: Payer: Self-pay | Admitting: *Deleted

## 2018-10-05 NOTE — Telephone Encounter (Signed)
Received fax Prior Authorization from [place] for [place].  Placed form in [place] folder to review, fill out and sign. To be faxed back to [phone].

## 2018-10-07 NOTE — Telephone Encounter (Signed)
Received fax from [facility] stating [place] 02/02/18-12/31-20 Member ID: [id number]

## 2018-10-21 DIAGNOSIS — G629 Polyneuropathy, unspecified: Secondary | ICD-10-CM | POA: Diagnosis not present

## 2018-10-21 DIAGNOSIS — Z7409 Other reduced mobility: Secondary | ICD-10-CM | POA: Diagnosis not present

## 2018-10-21 DIAGNOSIS — M1990 Unspecified osteoarthritis, unspecified site: Secondary | ICD-10-CM | POA: Diagnosis not present

## 2018-10-21 DIAGNOSIS — K219 Gastro-esophageal reflux disease without esophagitis: Secondary | ICD-10-CM | POA: Diagnosis not present

## 2018-10-21 DIAGNOSIS — R69 Illness, unspecified: Secondary | ICD-10-CM | POA: Diagnosis not present

## 2018-10-21 DIAGNOSIS — Z87891 Personal history of nicotine dependence: Secondary | ICD-10-CM | POA: Diagnosis not present

## 2018-10-21 DIAGNOSIS — Z7722 Contact with and (suspected) exposure to environmental tobacco smoke (acute) (chronic): Secondary | ICD-10-CM | POA: Diagnosis not present

## 2018-10-21 DIAGNOSIS — H547 Unspecified visual loss: Secondary | ICD-10-CM | POA: Diagnosis not present

## 2018-10-21 DIAGNOSIS — Z91010 Allergy to peanuts: Secondary | ICD-10-CM | POA: Diagnosis not present

## 2018-10-21 DIAGNOSIS — G8929 Other chronic pain: Secondary | ICD-10-CM | POA: Diagnosis not present

## 2018-10-24 ENCOUNTER — Other Ambulatory Visit: Payer: Self-pay | Admitting: *Deleted

## 2018-10-24 ENCOUNTER — Other Ambulatory Visit: Payer: Self-pay | Admitting: Nurse Practitioner

## 2018-10-24 DIAGNOSIS — J438 Other emphysema: Secondary | ICD-10-CM

## 2018-10-24 DIAGNOSIS — E782 Mixed hyperlipidemia: Secondary | ICD-10-CM

## 2018-10-24 MED ORDER — ATORVASTATIN CALCIUM 10 MG PO TABS: ORAL_TABLET | ORAL | 1 refills | Status: DC

## 2018-10-24 NOTE — Telephone Encounter (Signed)
[facility] 

## 2018-11-03 ENCOUNTER — Ambulatory Visit: Payer: Medicare HMO | Admitting: Nurse Practitioner

## 2018-11-07 ENCOUNTER — Encounter: Payer: Self-pay | Admitting: Nurse Practitioner

## 2018-11-07 ENCOUNTER — Ambulatory Visit (INDEPENDENT_AMBULATORY_CARE_PROVIDER_SITE_OTHER): Payer: Medicare HMO | Admitting: Nurse Practitioner

## 2018-11-07 ENCOUNTER — Other Ambulatory Visit: Payer: Self-pay

## 2018-11-07 VITALS — BP 124/86 | HR 75 | Temp 98.7°F

## 2018-11-07 DIAGNOSIS — E782 Mixed hyperlipidemia: Secondary | ICD-10-CM

## 2018-11-07 DIAGNOSIS — I10 Essential (primary) hypertension: Secondary | ICD-10-CM | POA: Diagnosis not present

## 2018-11-07 DIAGNOSIS — G609 Hereditary and idiopathic neuropathy, unspecified: Secondary | ICD-10-CM

## 2018-11-07 DIAGNOSIS — Z9359 Other cystostomy status: Secondary | ICD-10-CM | POA: Diagnosis not present

## 2018-11-07 DIAGNOSIS — Z23 Encounter for immunization: Secondary | ICD-10-CM | POA: Diagnosis not present

## 2018-11-07 DIAGNOSIS — D649 Anemia, unspecified: Secondary | ICD-10-CM

## 2018-11-07 DIAGNOSIS — J438 Other emphysema: Secondary | ICD-10-CM | POA: Diagnosis not present

## 2018-11-07 DIAGNOSIS — R739 Hyperglycemia, unspecified: Secondary | ICD-10-CM | POA: Diagnosis not present

## 2018-11-07 DIAGNOSIS — K219 Gastro-esophageal reflux disease without esophagitis: Secondary | ICD-10-CM | POA: Diagnosis not present

## 2018-11-07 NOTE — Progress Notes (Signed)
Careteam: Patient Care Team: Lauree Chandler, NP as PCP - General (Geriatric Medicine) [name], MD (Inactive) as Consulting Physician (Urology)  Advanced Directive information Does Patient Have a Medical Advance Directive?: Yes, Does patient want to make changes to medical advance directive?: No - Patient declined  Allergies  Allergen Reactions  . Peanut Oil Shortness Of Breath, Itching and Swelling    Swelling of the throat   . Peanut-Containing Drug Products Shortness Of Breath and Swelling    Swelling of the throat     Chief Complaint  Patient presents with  . Medical Management of Chronic Issues    5 month follow-up   . Immunizations    Dicsuss need for PNA. Declined TD/TDap      HPI: Patient is a 83 y.o. male seen in the office today for routine follow up. Having trouble hearing today. Family planning on moving back to [place] next summer, building home now.   Poor vision and hearing -He is legally blind in [place]. His peripheral vision is worsening. He has optic nerve damage.Eye specialist is Dr [name]. He is HOH. He wears a cochlear implant on left. He has issues with hearing in loud places.   Neuropathy- started on lyrica at last visit, it took several weeks to get medication but once he started he feels like it is beneficial to neuropathy. Still having cramps but did not expect that to help with that aspect.  Hx of Foot ulcer but none recently.  Prediabetic- stable. A1c 5.8% in aug. Diabetic diet.   GERD - stable on protonix. Controlled symptoms.   Constipation- stable on home regimen, using OTC   Hyperlipidemia - stable on lipitor. LDL 71. Healthy diet.   HTN - BP stable on previous visit, on losartan, amlodipine.  Neurogenic bladder/urinary retention- foley cath is chronic.  He has recurrent UTI- none recently. Followed by Dr [name]. On going follow up and SP tube change by Dr [name] 10/2018 He continues to useirrigation.    GAD/insomnia - mood stable on celexa. Takes trazodone qhs to help sleep. He also takes melatonin. Feels like sleep is great  COPD - stable on Breo. No shortness of breath. Doing well.   GERD- symptoms controlled on protonix.   Review of Systems:  Review of Systems  Constitutional: Negative for chills, fever and weight loss.  HENT: Positive for hearing loss.   Respiratory: Negative for sputum production.   Cardiovascular: Negative for chest pain.  Gastrointestinal: Negative for abdominal pain and heartburn.  Genitourinary:       Suprapubic foley  Musculoskeletal: Positive for myalgias (lower legs). Negative for back pain, falls and joint pain.  Skin: Negative.   Neurological: Positive for tingling and sensory change. Negative for dizziness and headaches.  Psychiatric/Behavioral: Negative for depression. The patient is not nervous/anxious and does not have insomnia.     Past Medical History:  Diagnosis Date  . Anxiety   . Arthritis    neck   . 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 Cancer [facility]) 1956   left testicle  . COPD (chronic obstructive pulmonary disease) ([place])   . 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 Eczema   . Enlarged prostate   . High cholesterol   . Hypertension   . Loss of balance    due to meningitis  . Meningitis   . Neurogenic bladder    History of  . Numbness    in feet due to meningitis   .  Rotator cuff tear    right  . Skin cancer   . Stroke [facility])    mini strokes in hospital with meningitis  . Unable to walk    due to meningitis   Past Surgical History:  Procedure Laterality Date  . APPENDECTOMY    . CATARACT EXTRACTION Bilateral 03/05/2014  . COCHLEAR IMPLANT    . CYSTOSCOPY WITH LITHOLAPAXY N/A 05/14/2014   Procedure: CYSTOSCOPY WITH LITHOLAPAXY;  Surgeon: [name], MD;  Location: [facility];  Service: Urology;  Laterality: N/A;  . CYSTOSCOPY WITH LITHOLAPAXY N/A 04/22/2015   Procedure: CYSTOSCOPY WITH LITHOLAPAXY;  Surgeon: [name], MD;   Location: [facility];  Service: Urology;  Laterality: N/A;  . INSERTION OF SUPRAPUBIC CATHETER N/A 05/14/2014   Procedure: INSERTION OF SUPRAPUBIC CATHETER;  Surgeon: [name], MD;  Location: [facility];  Service: Urology;  Laterality: N/A;  . INSERTION OF SUPRAPUBIC CATHETER N/A 04/22/2015   Procedure: INSERTION OF SUPRAPUBIC CATHETER;  Surgeon: [name], MD;  Location: [facility];  Service: Urology;  Laterality: N/A;  . PROSTATE SURGERY     biopsy x 2  . TESTICLE REMOVAL Left    October 1956  . TONSILLECTOMY     childhood   Social History:   reports that he quit smoking about 53 years ago. His smoking use included cigarettes. He started smoking about 63 years ago. He quit after 10.00 years of use. He has never used smokeless tobacco. He reports current alcohol use. He reports that he does not use drugs.  Family History  Problem Relation Age of Onset  . Stroke Mother   . Heart failure Father   . Kidney Stones Daughter     Medications: Patient's Medications  New Prescriptions   No medications on file  Previous Medications   AMLODIPINE (NORVASC) 5 MG TABLET    TAKE 1 TABLET BY MOUTH EVERY DAY   ATORVASTATIN (LIPITOR) 10 MG TABLET    TAKE 1 TABLET (10 MG TOTAL) BY MOUTH AT BEDTIME.   CALCIUM CARBONATE-VIT D-MIN (CALCIUM 1200 PO)    Take 1 tablet by mouth daily.   CITALOPRAM (CELEXA) 20 MG TABLET    TAKE ONE TABLET BY MOUTH ONCE DAILY FOR ANXIETY   FLUTICASONE FUROATE-VILANTEROL (BREO ELLIPTA) 100-25 MCG/INH AEPB    INHALE 1 PUFF BY MOUTH EVERY DAY   LACTOBACILLUS (ACIDOPHILUS) 100 MG CAPS    Take 100 mg by mouth every morning.    LOSARTAN (COZAAR) 25 MG TABLET    TAKE 1 TABLET BY MOUTH ONCE DAILY TO CONTROL BLOOD PRESSURE   MAGNESIUM 500 MG CAPS    Take 1 capsule by mouth daily.   MAGNESIUM HYDROXIDE (MILK OF MAGNESIA) 400 MG/5ML SUSPENSION    Take 15 mLs by mouth daily as needed for mild constipation.   MELATONIN 10 MG TABS    Take 10 mg by mouth at bedtime.    PANTOPRAZOLE  (PROTONIX) 40 MG TABLET    TAKE ONE TABLET BY MOUTH ONCE DAILY FOR STOMACH   PREGABALIN (LYRICA) 25 MG CAPSULE    Take 1 capsule (25 mg total) by mouth at bedtime.   SODIUM CHLORIDE IRRIGATION 0.9 % IRRIGATION    Bladder wash   TRAZODONE (DESYREL) 100 MG TABLET    TAKE 1 TABLET BY MOUTH EVERYDAY AT BEDTIME   TRIAMCINOLONE CREAM (KENALOG) 0.1 %    Apply 1 application topically daily.   UNABLE TO FIND    Med Name: [place] irrigate 2 times daily.  Modified Medications   No medications  on file  Discontinued Medications   No medications on file    Physical Exam:  Vitals:   11/07/18 1130  BP: 124/86  Pulse: 75  Temp: 98.7 F (37.1 C)  TempSrc: Temporal  SpO2: 96%   There is no height or weight on file to calculate BMI. Wt Readings from Last 3 Encounters:  03/09/17 185 lb 12.8 oz (84.3 kg)  09/25/16 186 lb (84.4 kg)  03/20/16 192 lb 12.8 oz (87.5 kg)    Physical Exam Constitutional:      Appearance: He is well-developed.  Eyes:     General: No scleral icterus.    Pupils: Pupils are equal, round, and reactive to light.  Neck:     Musculoskeletal: Neck supple.     Thyroid: No thyromegaly.     Vascular: No carotid bruit.  Cardiovascular:     Rate and Rhythm: Normal rate and regular rhythm.     Heart sounds: Murmur (1/6 SEM) present. No friction rub. No gallop.      Comments: no distal LE swelling. No calf TTP Pulmonary:     Effort: Pulmonary effort is normal.     Breath sounds: Normal breath sounds. No wheezing or rales.  Chest:     Chest wall: No tenderness.  Abdominal:     General: There is no abdominal bruit.     Palpations: There is no hepatomegaly or pulsatile mass.     Tenderness: There is no guarding or rebound.  Genitourinary:    Comments: Foley cath intact and DTG yellow urine with min sediment Lymphadenopathy:     Cervical: No cervical adenopathy.  Skin:    General: Skin is warm and dry.     Findings: No rash.  Neurological:     Mental Status: He is  alert and oriented to person, place, and time.     Deep Tendon Reflexes: Reflexes are normal and symmetric.  Psychiatric:        Behavior: Behavior normal.        Thought Content: Thought content normal.        Judgment: Judgment normal.     Labs reviewed: Basic Metabolic Panel: Recent Labs    11/26/17 1100 09/28/18 1342  NA 134* 133*  K 4.7 4.8  CL 98 99  CO2 27 26  GLUCOSE 93 108  BUN 16 18  CREATININE 1.08 1.03  CALCIUM 9.4 9.1  TSH 0.89  --    Liver Function Tests: Recent Labs    11/26/17 1100 09/28/18 1342  AST 15 15  ALT 12 11  BILITOT 0.4 0.4  PROT 6.8 6.6   No results for input(s): LIPASE, AMYLASE in the last 8760 hours. No results for input(s): AMMONIA in the last 8760 hours. CBC: Recent Labs    11/26/17 1100 09/28/18 1342  WBC 9.7 9.7  NEUTROABS 6,615 6,664  HGB 14.1 12.7*  HCT 41.1 38.6  MCV 90.1 92.1  PLT 235 264   Lipid Panel: Recent Labs    11/26/17 1100  CHOL 158  HDL 70  LDLCALC 71  TRIG 91  CHOLHDL 2.3   TSH: Recent Labs    11/26/17 1100  TSH 0.89   A1C: Lab Results  Component Value Date   HGBA1C 5.8 (H) 09/28/2018     Assessment/Plan 1. Mixed hyperlipidemia -continues on lipitor 10 mg daily  - Lipid Panel  2. Hyperglycemia -diet controlled.  - Pneumococcal conjugate vaccine 13-valent  3.  Other emphysema ([place]) -stable on breo, without exacerbation  - Pneumococcal  conjugate vaccine 13-valent  4. Essential hypertension Controlled on losartan and norvasc - BASIC METABOLIC PANEL WITH GFR  6. Idiopathic peripheral neuropathy -improved symptoms on lyrica 25 mg daily, will continue current regimen.   7. Suprapubic catheter ([facility]) -stable, recent exchange, without complication or recent UTI  8. Anemia, unspecified type - CBC with Differential/Platelet  9. Need for vaccination against Streptococcus pneumoniae using pneumococcal conjugate vaccine 13 - Pneumococcal conjugate vaccine 13-valent  10.GERD Stable  on protonix daily   Next appt: 6 months.  Carlos American. [place], [facility] [phone]

## 2018-11-08 DIAGNOSIS — G609 Hereditary and idiopathic neuropathy, unspecified: Secondary | ICD-10-CM | POA: Insufficient documentation

## 2018-11-08 LAB — BASIC METABOLIC PANEL WITH GFR
BUN: 20 mg/dL (ref 7–25)
CO2: 27 mmol/L (ref 20–32)
Calcium: 9.2 mg/dL (ref 8.6–10.3)
Chloride: 99 mmol/L (ref 98–110)
Creat: 1.04 mg/dL (ref 0.70–1.11)
GFR, Est African American: 78 mL/min/{1.73_m2} (ref >=60)
GFR, Est Non African American: 67 mL/min/{1.73_m2} (ref >=60)
Glucose, Bld: 108 mg/dL (ref 65–139)
Potassium: 4.6 mmol/L (ref 3.5–5.3)
Sodium: 133 mmol/L — ABNORMAL LOW (ref 135–146)

## 2018-11-08 LAB — LIPID PANEL
Cholesterol: 150 mg/dL (ref <200)
HDL: 69 mg/dL (ref >=40)
LDL Cholesterol (Calc): 65 mg/dL (calc)
Non-HDL Cholesterol (Calc): 81 mg/dL (calc) (ref <130)
Total CHOL/HDL Ratio: 2.2 (calc) (ref <5.0)
Triglycerides: 82 mg/dL (ref <150)

## 2018-11-08 LAB — CBC WITH DIFFERENTIAL/PLATELET
Absolute Monocytes: 1283 cells/uL — ABNORMAL HIGH (ref 200–950)
Basophils Absolute: 36 cells/uL (ref 0–200)
Basophils Relative: 0.4 %
Eosinophils Absolute: 36 cells/uL (ref 15–500)
Eosinophils Relative: 0.4 %
HCT: 39.9 % (ref 38.5–50.0)
Hemoglobin: 13.2 g/dL (ref 13.2–17.1)
Lymphs Abs: 1147 cells/uL (ref 850–3900)
MCH: 30.3 pg (ref 27.0–33.0)
MCHC: 33.1 g/dL (ref 32.0–36.0)
MCV: 91.5 fL (ref 80.0–100.0)
MPV: 9.8 fL (ref 7.5–12.5)
Monocytes Relative: 14.1 %
Neutro Abs: 6598 cells/uL (ref 1500–7800)
Neutrophils Relative %: 72.5 %
Platelets: 230 10*3/uL (ref 140–400)
RBC: 4.36 10*6/uL (ref 4.20–5.80)
RDW: 12.6 % (ref 11.0–15.0)
Total Lymphocyte: 12.6 %
WBC: 9.1 10*3/uL (ref 3.8–10.8)

## 2018-11-11 ENCOUNTER — Other Ambulatory Visit: Payer: Self-pay | Admitting: *Deleted

## 2018-11-11 DIAGNOSIS — F411 Generalized anxiety disorder: Secondary | ICD-10-CM

## 2018-11-11 DIAGNOSIS — I10 Essential (primary) hypertension: Secondary | ICD-10-CM

## 2018-11-11 MED ORDER — CITALOPRAM HYDROBROMIDE 20 MG PO TABS: ORAL_TABLET | ORAL | 1 refills | Status: DC

## 2018-11-11 MED ORDER — LOSARTAN POTASSIUM 25 MG PO TABS: ORAL_TABLET | ORAL | 1 refills | Status: DC

## 2018-11-11 NOTE — Telephone Encounter (Signed)
[facility] Requested refill

## 2018-11-30 DIAGNOSIS — L570 Actinic keratosis: Secondary | ICD-10-CM | POA: Diagnosis not present

## 2018-11-30 DIAGNOSIS — D1801 Hemangioma of skin and subcutaneous tissue: Secondary | ICD-10-CM | POA: Diagnosis not present

## 2018-11-30 DIAGNOSIS — L821 Other seborrheic keratosis: Secondary | ICD-10-CM | POA: Diagnosis not present

## 2018-11-30 DIAGNOSIS — Z85820 Personal history of malignant melanoma of skin: Secondary | ICD-10-CM | POA: Diagnosis not present

## 2018-11-30 DIAGNOSIS — Z08 Encounter for follow-up examination after completed treatment for malignant neoplasm: Secondary | ICD-10-CM | POA: Diagnosis not present

## 2018-12-02 ENCOUNTER — Other Ambulatory Visit: Payer: Self-pay | Admitting: Nurse Practitioner

## 2018-12-02 DIAGNOSIS — I10 Essential (primary) hypertension: Secondary | ICD-10-CM

## 2018-12-05 ENCOUNTER — Other Ambulatory Visit: Payer: Self-pay | Admitting: *Deleted

## 2018-12-05 DIAGNOSIS — G4700 Insomnia, unspecified: Secondary | ICD-10-CM

## 2018-12-05 MED ORDER — TRAZODONE HCL 100 MG PO TABS: ORAL_TABLET | ORAL | 1 refills | Status: DC

## 2018-12-05 MED ORDER — PANTOPRAZOLE SODIUM 40 MG PO TBEC: DELAYED_RELEASE_TABLET | ORAL | 1 refills | Status: DC

## 2018-12-05 NOTE — Telephone Encounter (Signed)
[facility] Pended Rx's and sent to [facility] for approval due to [place].

## 2018-12-06 ENCOUNTER — Other Ambulatory Visit: Payer: Self-pay | Admitting: Nurse Practitioner

## 2018-12-06 DIAGNOSIS — G609 Hereditary and idiopathic neuropathy, unspecified: Secondary | ICD-10-CM

## 2018-12-06 NOTE — Telephone Encounter (Signed)
Last filled 09/28/18 in epic #30/1 refill

## 2018-12-15 ENCOUNTER — Other Ambulatory Visit: Payer: Self-pay | Admitting: Nurse Practitioner

## 2018-12-15 DIAGNOSIS — J438 Other emphysema: Secondary | ICD-10-CM

## 2018-12-26 DIAGNOSIS — Z9359 Other cystostomy status: Secondary | ICD-10-CM | POA: Diagnosis not present

## 2018-12-26 DIAGNOSIS — N3020 Other chronic cystitis without hematuria: Secondary | ICD-10-CM | POA: Diagnosis not present

## 2018-12-26 DIAGNOSIS — N318 Other neuromuscular dysfunction of bladder: Secondary | ICD-10-CM | POA: Diagnosis not present

## 2018-12-26 DIAGNOSIS — N138 Other obstructive and reflux uropathy: Secondary | ICD-10-CM | POA: Diagnosis not present

## 2018-12-26 DIAGNOSIS — R338 Other retention of urine: Secondary | ICD-10-CM | POA: Diagnosis not present

## 2018-12-26 DIAGNOSIS — N401 Enlarged prostate with lower urinary tract symptoms: Secondary | ICD-10-CM | POA: Diagnosis not present

## 2019-01-10 DIAGNOSIS — Z466 Encounter for fitting and adjustment of urinary device: Secondary | ICD-10-CM | POA: Diagnosis not present

## 2019-01-10 DIAGNOSIS — R339 Retention of urine, unspecified: Secondary | ICD-10-CM | POA: Diagnosis not present

## 2019-02-04 ENCOUNTER — Other Ambulatory Visit: Payer: Self-pay | Admitting: Nurse Practitioner

## 2019-02-04 DIAGNOSIS — G609 Hereditary and idiopathic neuropathy, unspecified: Secondary | ICD-10-CM

## 2019-02-06 NOTE — Telephone Encounter (Signed)
Lyrica refill request.  Last refill 01/06/19. Last OV 11/07/18.

## 2019-02-21 DIAGNOSIS — R338 Other retention of urine: Secondary | ICD-10-CM | POA: Diagnosis not present

## 2019-02-21 DIAGNOSIS — N318 Other neuromuscular dysfunction of bladder: Secondary | ICD-10-CM | POA: Diagnosis not present

## 2019-02-21 DIAGNOSIS — Z9359 Other cystostomy status: Secondary | ICD-10-CM | POA: Diagnosis not present

## 2019-03-03 ENCOUNTER — Encounter (HOSPITAL_BASED_OUTPATIENT_CLINIC_OR_DEPARTMENT_OTHER): Payer: Medicare HMO | Attending: Internal Medicine | Admitting: Internal Medicine

## 2019-03-03 ENCOUNTER — Other Ambulatory Visit: Payer: Self-pay

## 2019-03-03 DIAGNOSIS — M1990 Unspecified osteoarthritis, unspecified site: Secondary | ICD-10-CM | POA: Insufficient documentation

## 2019-03-03 DIAGNOSIS — J449 Chronic obstructive pulmonary disease, unspecified: Secondary | ICD-10-CM | POA: Insufficient documentation

## 2019-03-03 DIAGNOSIS — R7303 Prediabetes: Secondary | ICD-10-CM | POA: Diagnosis not present

## 2019-03-03 DIAGNOSIS — L97512 Non-pressure chronic ulcer of other part of right foot with fat layer exposed: Secondary | ICD-10-CM | POA: Insufficient documentation

## 2019-03-03 DIAGNOSIS — G9009 Other idiopathic peripheral autonomic neuropathy: Secondary | ICD-10-CM | POA: Diagnosis not present

## 2019-03-03 DIAGNOSIS — Z87891 Personal history of nicotine dependence: Secondary | ICD-10-CM | POA: Diagnosis not present

## 2019-03-03 DIAGNOSIS — G608 Other hereditary and idiopathic neuropathies: Secondary | ICD-10-CM | POA: Diagnosis not present

## 2019-03-03 DIAGNOSIS — I10 Essential (primary) hypertension: Secondary | ICD-10-CM | POA: Diagnosis not present

## 2019-03-03 DIAGNOSIS — H547 Unspecified visual loss: Secondary | ICD-10-CM | POA: Diagnosis not present

## 2019-03-03 DIAGNOSIS — N3020 Other chronic cystitis without hematuria: Secondary | ICD-10-CM | POA: Diagnosis not present

## 2019-03-03 NOTE — Progress Notes (Addendum)
Ricardo [name], Ricardo [name] ([id number]) Visit Report for 03/03/2019 Allergy List Details Patient Name: Date of Service: Ricardo [name], Ricardo [name] 03/03/2019 10:30 AM Medical Record [id number] Patient Account Number: [id number] Date of Birth/Sex: 1935/04/27 (84 y.o. Male) Treating RN: Baruch Gouty Primary Care Loretto Belinsky: Sherrie Mustache Other Clinician: Referring Delray Reza: Treating Irie Dowson/Extender:Robson, [name], [name] in Treatment: 0 Allergies Active Allergies peanut Reaction: throat closes up Allergy Notes Electronic Signature(s) Signed: 03/03/2019 5:55:15 PM By: Baruch Gouty RN, BSN Entered By: Baruch Gouty on 03/03/2019 10:43:57 -------------------------------------------------------------------------------- Arrival Information Details Patient Name: Date of Service: Ricardo [name] 03/03/2019 10:30 AM Medical Record [id number] Patient Account Number: [id number] Date of Birth/Sex: Mar 30, 1935 (84 y.o. Male) Treating RN: Baruch Gouty Primary Care Vashon Arch: Sherrie Mustache Other Clinician: Referring Aayla Marrocco: Treating Dovid Bartko/Extender:Robson, [name], [name] in Treatment: 0 Visit Information Patient Arrived: Wheel Chair Arrival Time: 10:31 Accompanied By: Charolett Bumpers Transfer Assistance: EasyPivot Patient Lift Patient Identification Verified: Yes Secondary Verification Process Yes Completed: History Since Last Visit Has Dressing in Place as Prescribed: Yes Electronic Signature(s) Signed: 03/03/2019 5:55:15 PM By: Baruch Gouty RN, BSN Entered By: Baruch Gouty on 03/03/2019 10:38:25 -------------------------------------------------------------------------------- Clinic Level of Care Assessment Details Patient Name: Date of Service: Ricardo [name], Ricardo [name] 03/03/2019 10:30 AM Medical Record [id number] Patient Account Number: [id number] Date of Birth/Sex: October 10, 1935 (84 y.o. Male) Treating RN: Kela Millin Primary Care Bayan Kushnir:  Sherrie Mustache Other Clinician: Referring Vedder Brittian: Treating Sherlene Rickel/Extender:Robson, [name], [name] in Treatment: 0 Clinic Level of Care Assessment Items TOOL 1 Quantity Score X - Use when EandM and Procedure is performed on INITIAL visit 1 0 ASSESSMENTS - Nursing Assessment / Reassessment X - General Physical Exam (combine w/ comprehensive assessment (listed just below) 1 20 when performed on new pt. evals) X - Comprehensive Assessment (HX, ROS, Risk Assessments, Wounds Hx, etc.) 1 25 ASSESSMENTS - Wound and Skin Assessment / Reassessment []  - Dermatologic / Skin Assessment (not related to wound area) 0 ASSESSMENTS - Ostomy and/or Continence Assessment and Care []  - Incontinence Assessment and Management 0 []  - Ostomy Care Assessment and Management (repouching, etc.) 0 PROCESS - Coordination of Care X - Simple Patient / Family Education for ongoing care 1 15 []  - Complex (extensive) Patient / Family Education for ongoing care 0 X - Staff obtains Programmer, systems, Records, Test Results / Process Orders 1 10 []  - Staff telephones HHA, Nursing Homes / Clarify orders / etc 0 []  - Routine Transfer to another Facility (non-emergent condition) 0 []  - Routine Hospital Admission (non-emergent condition) 0 X - New Admissions / Biomedical engineer / Ordering NPWT, Apligraf, etc. 1 15 []  - Emergency Hospital Admission (emergent condition) 0 PROCESS - Special Needs []  - Pediatric / Minor Patient Management 0 []  - Isolation Patient Management 0 []  - Hearing / Language / Visual special needs 0 []  - Assessment of Community assistance (transportation, D/C planning, etc.) 0 []  - Additional assistance / Altered mentation 0 []  - Support Surface(s) Assessment (bed, cushion, seat, etc.) 0 INTERVENTIONS - Miscellaneous []  - External ear exam 0 []  - Patient Transfer (multiple staff / Civil Service fast streamer / Similar devices) 0 []  - Simple Staple / Suture removal (25 or less) 0 []  - Complex Staple  / Suture removal (26 or more) 0 []  - Hypo/Hyperglycemic Management (do not check if billed separately) 0 []  - Ankle / Brachial Index (ABI) - do not check if billed separately 0 Has the patient been seen at the hospital within the last three years: Yes Total Score: 85 Level Of Care: New/Established -  Level 3 Electronic Signature(s) Signed: 03/03/2019 5:49:40 PM By: Kela Millin Entered By: Kela Millin on 03/03/2019 11:24:16 -------------------------------------------------------------------------------- Encounter Discharge Information Details Patient Name: Date of Service: Ricardo [name] 03/03/2019 10:30 AM Medical Record [id number] Patient Account Number: [id number] Date of Birth/Sex: 1935/05/21 (84 y.o. Male) Treating RN: Deon Pilling Primary Care Arbutus Nelligan: Sherrie Mustache Other Clinician: Referring Jeramine Delis: Treating Lyllian Gause/Extender:Robson, [name], [name] in Treatment: 0 Encounter Discharge Information Items Post Procedure Vitals Discharge Condition: Stable Temperature (F): 98.6 Ambulatory Status: Wheelchair Pulse (bpm): 69 Discharge Destination: Home Respiratory Rate (breaths/min): 18 Transportation: Private Auto Blood Pressure (mmHg): 130/59 Accompanied By: self Schedule Follow-up Appointment: Yes Clinical Summary of Care: Electronic Signature(s) Signed: 03/03/2019 6:00:37 PM By: Deon Pilling Entered By: Deon Pilling on 03/03/2019 11:33:00 -------------------------------------------------------------------------------- Lower Extremity Assessment Details Patient Name: Date of Service: Ricardo [name], Ricardo [name] 03/03/2019 10:30 AM Medical Record [id number] Patient Account Number: [id number] Date of Birth/Sex: [birth year] (84 y.o. Male) Treating RN: Baruch Gouty Primary Care Sam Overbeck: Sherrie Mustache Other Clinician: Referring Sadae Arrazola: Treating Ameya Kutz/Extender:Robson, [name], [name] in Treatment: 0 Edema  Assessment Assessed: [Left: No] [Right: No] Edema: [Left: N] [Right: o] Calf Left: Right: Point of Measurement: cm From Medial Instep cm 33.6 cm Ankle Left: Right: Point of Measurement: cm From Medial Instep cm 20.2 cm Vascular Assessment Pulses: Dorsalis Pedis Palpable: [Right:Yes] Electronic Signature(s) Signed: 03/03/2019 5:55:15 PM By: Baruch Gouty RN, BSN Entered By: Baruch Gouty on 03/03/2019 10:55:47 -------------------------------------------------------------------------------- Multi Wound Chart Details Patient Name: Date of Service: Ricardo [name] 03/03/2019 10:30 AM Medical Record [id number] Patient Account Number: [id number] Date of Birth/Sex: 21-Nov-1935 (84 y.o. Male) Treating RN: Primary Care Krrish Freund: Sherrie Mustache Other Clinician: Referring Taunya Goral: Treating Jacorey Donaway/Extender:Robson, [name], [name] in Treatment: 0 Vital Signs Height(in): 72 Pulse(bpm): 57 Weight(lbs): 183 Blood Pressure(mmHg): 130/59 Body Mass Index(BMI): 25 Temperature(F): 98.6 Respiratory 18 Rate(breaths/min): Photos: [3:No Photos] [N/A:N/A] Wound Location: [3:Toe Fourth - Lateral] [N/A:N/A] Wounding Event: [3:Gradually Appeared] [N/A:N/A] Primary Etiology: [3:Atypical] [N/A:N/A] Comorbid History: [3:Cataracts, Middle ear problems, Chronic Obstructive Pulmonary Disease (COPD), Hypertension, Osteoarthritis, Neuropathy, Confinement Anxiety] [N/A:N/A] Date Acquired: [3:02/03/2019] [N/A:N/A] Weeks of Treatment: [3:0] [N/A:N/A] Wound Status: [3:Open] [N/A:N/A] Measurements L x W x D 0.5x0.5x0.1 [N/A:N/A] (cm) Area (cm) : [3:0.196] [N/A:N/A] Volume (cm) : [3:0.02] [N/A:N/A] Classification: [3:Full Thickness Without Exposed Support Structures] [N/A:N/A] Exudate Amount: [3:Small] [N/A:N/A] Exudate Type: [3:Sanguinous] [N/A:N/A] Exudate Color: [3:red] [N/A:N/A] Wound Margin: [3:Distinct, outline attached N/A] Granulation Amount: [3:Large (67-100%)]  [N/A:N/A] Granulation Quality: [3:Red, Pink] [N/A:N/A] Necrotic Amount: [3:None Present (0%)] [N/A:N/A] Exposed Structures: [3:Fat Layer (Subcutaneous N/A Tissue) Exposed: Yes Fascia: No Tendon: No Muscle: No Joint: No Bone: No] Debridement: [3:Debridement - Excisional N/A] Pre-procedure [3:11:15] [N/A:N/A] Verification/Time Out Taken: Pain Control: [3:Other] [N/A:N/A] Tissue Debrided: [3:Callus, Subcutaneous] [N/A:N/A] Level: [3:Skin/Subcutaneous Tissue N/A] Debridement Area (sq cm):0.25 [N/A:N/A] Instrument: [3:Curette] [N/A:N/A] Bleeding: [3:Moderate] [N/A:N/A] Hemostasis Achieved: [3:Silver Nitrate] [N/A:N/A] Procedural Pain: [3:0] [N/A:N/A] Post Procedural Pain: [3:0] [N/A:N/A] Debridement Treatment Procedure was tolerated [N/A:N/A] Response: [3:well] Post Debridement [3:0.5x0.5x0.1] [N/A:N/A] Measurements L x W x D (cm) Post Debridement [3:0.02] [N/A:N/A] Volume: (cm) Procedures Performed: Debridement [N/A:N/A] Treatment Notes Wound #3 (Lateral Toe Fourth) [3:1. Cleanse With Wound Cleanser Soap and water 3. Primary Dressing Applied Calcium Alginate Ag 4. Secondary Dressing Dry Gauze 5. Secured With Hannah tape] Notes explained the orders, dressings, frequency of change, and when to return to wound center. Electronic Signature(s) Signed: 03/03/2019 5:55:42 PM By: Linton Ham MD Entered By: Linton Ham on 03/03/2019 12:38:00 -------------------------------------------------------------------------------- Multi-Disciplinary Care Plan Details Patient Name: Date of Service: Ricardo [name],  Ricardo J. 03/03/2019 10:30 AM Medical Record [id number] Patient Account Number: [id number] Date of Birth/Sex: 1935-04-13 (84 y.o. Male) Treating RN: Kela Millin Primary Care Santana Gosdin: Sherrie Mustache Other Clinician: Referring Jamye Balicki: Treating Eris Hannan/Extender:Robson, [name], [name] in Treatment: 0 Active Inactive Wound/Skin Impairment Nursing  Diagnoses: Impaired tissue integrity Goals: Ulcer/skin breakdown will have a volume reduction of 30% by week 4 Date Initiated: 03/03/2019 Target Resolution Date: 03/31/2019 Goal Status: Active Interventions: Provide education on ulcer and skin care Notes: Electronic Signature(s) Signed: 03/03/2019 5:49:40 PM By: Kela Millin Entered By: Kela Millin on 03/03/2019 11:24:48 -------------------------------------------------------------------------------- Pain Assessment Details Patient Name: Date of Service: Ricardo [name] 03/03/2019 10:30 AM Medical Record [id number] Patient Account Number: [id number] Date of Birth/Sex: 05/20/1935 (83 y.o. Male) Treating RN: Baruch Gouty Primary Care Edwin Cherian: Sherrie Mustache Other Clinician: Referring Shakiera Edelson: Treating Chavela Justiniano/Extender:Robson, [name], [name] in Treatment: 0 Active Problems Location of Pain Severity and Description of Pain Patient Has Paino Yes Site Locations Pain Location: Generalized Pain Duration of the Pain. Constant / Intermittento Intermittent Rate the pain. Current Pain Level: 0 Worst Pain Level: 3 Least Pain Level: 0 Character of Pain Describe the Pain: Aching Pain Management and Medication Current Pain Management: Is the Current Pain Management Adequate: Adequate How does your wound impact your activities of daily livingo Sleep: Yes Bathing: Yes Appetite: Yes Relationship With Others: Yes Bladder Continence: Yes Emotions: Yes Bowel Continence: Yes Work: Yes Toileting: Yes Drive: Yes Dressing: Yes Hobbies: Yes Notes reports occasional pain in right 5th toe Electronic Signature(s) Signed: 03/03/2019 5:55:15 PM By: Baruch Gouty RN, BSN Entered By: Baruch Gouty on 03/03/2019 10:58:13 -------------------------------------------------------------------------------- Patient/Caregiver Education Details Patient Name: Ricardo [name] 1/29/2021andnbsp10:30 Date of  Service: AM Medical Record [id number] Number: Patient Account Number: [id number] Treating RN: 10-07-1935 (83 y.o. Kela Millin Date of Birth/Gender: Male) Other Clinician: Primary Care Treating Leanora Cover Physician: Physician/Extender: Referring Physician: Charlann Lange in Treatment: 0 Education Assessment Education Provided To: Patient Education Topics Provided Wound/Skin Impairment: Electronic Signature(s) Signed: 03/03/2019 5:49:40 PM By: Kela Millin Entered By: Kela Millin on 03/03/2019 11:24:57 -------------------------------------------------------------------------------- Wound Assessment Details Patient Name: Date of Service: Ricardo [name] 03/03/2019 10:30 AM Medical Record [id number] Patient Account Number: [id number] Date of Birth/Sex: 1935-08-24 (84 y.o. Male) Treating RN: Primary Care Jacie Tristan: Sherrie Mustache Other Clinician: Referring Oliviah Agostini: Treating Shivangi Lutz/Extender:Robson, [name], [name] in Treatment: 0 Wound Status Wound Number: 3 Primary Atypical Etiology: Wound Location: Toe Fourth - Lateral Wound Open Wounding Event: Gradually Appeared Status: Date Acquired: 02/03/2019 Comorbid Cataracts, Middle ear problems, Chronic Weeks Of Treatment: 0 History: Obstructive Pulmonary Disease (COPD), Clustered Wound: No Hypertension, Osteoarthritis, Neuropathy, Confinement Anxiety Photos Wound Measurements Length: (cm) 0.5 % Reduc Width: (cm) 0.5 % Reduc Depth: (cm) 0.1 Tunneli Area: (cm) 0.196 Underm Volume: (cm) 0.02 Wound Description Classification: Full Thickness Without Exposed Support Foul O Structures Slough Wound Distinct, outline attached Margin: Exudate Small Amount: Exudate Sanguinous Type: Exudate red Color: Wound Bed Granulation Amount: Large (67-100%) Granulation Quality: Red, Pink Fascia Necrotic Amount: None Present (0%) Fat  La Tendon Muscle Joint Bone E dor After Cleansing: No /Fibrino No Exposed Structure Exposed: No yer (Subcutaneous Tissue) Exposed: Yes Exposed: No Exposed: No Exposed: No xposed: No tion in Area: 0% tion in Volume: 0% ng: No ining: No Treatment Notes Wound #3 (Lateral Toe Fourth) 1. Cleanse With Wound Cleanser Soap and water 3. Primary Dressing Applied Calcium Alginate Ag 4. Secondary Dressing Dry Gauze 5. Secured With Medipore tape Notes explained the orders, dressings,  frequency of change, and when to return to wound center. Electronic Signature(s) Signed: 03/07/2019 5:19:10 PM By: Mikeal Hawthorne EMT/HBOT Previous Signature: 03/03/2019 5:49:40 PM Version By: Kela Millin Entered By: Mikeal Hawthorne on 03/07/2019 08:07:06 -------------------------------------------------------------------------------- Vitals Details Patient Name: Date of Service: Ricardo [name] 03/03/2019 10:30 AM Medical Record [id number] Patient Account Number: [id number] Date of Birth/Sex: 1935-08-19 (83 y.o. Male) Treating RN: Baruch Gouty Primary Care Kristalynn Coddington: Sherrie Mustache Other Clinician: Referring Nykia Turko: Treating Milaya Hora/Extender:Robson, [name], [name] in Treatment: 0 Vital Signs Time Taken: 10:38 Temperature (F): 98.6 Height (in): 72 Pulse (bpm): 69 Source: Stated Respiratory Rate (breaths/min): 18 Weight (lbs): 183 Blood Pressure (mmHg): 130/59 Source: Stated Reference Range: 80 - 120 mg / dl Body Mass Index (BMI): 24.8 Electronic Signature(s) Signed: 03/03/2019 5:55:15 PM By: Baruch Gouty RN, BSN Entered By: Baruch Gouty on 03/03/2019 10:39:08

## 2019-03-03 NOTE — Progress Notes (Signed)
Ricardo [name], Ricardo [name] ([id number]) Visit Report for 03/03/2019 Chief Complaint Document Details Patient Name: Date of Service: AKZEL, WALTERS 03/03/2019 10:30 AM Medical Record [id number] Patient Account Number: [id number] Date of Birth/Sex: 11-01-1935 (84 y.o. Male) Treating RN: Primary Care Provider: Sherrie Mustache Other Clinician: Referring Provider: Treating Provider/Extender:Loyce Klasen, [name], [name] in Treatment: 0 Information Obtained from: Patient Chief Complaint He is here for follow up evaluation right fifth toe web space 03/07/2018; patient presents once again for a wound in the webspace between his right fifth and fourth toe. 03/03/2019. Patient again presents for a wound in the webspace between his right fifth and fourth toe Electronic Signature(s) Signed: 03/03/2019 5:55:42 PM By: Linton Ham MD Entered By: Linton Ham on 03/03/2019 12:38:37 -------------------------------------------------------------------------------- Debridement Details Patient Name: Date of Service: [name] 03/03/2019 10:30 AM Medical Record [id number] Patient Account Number: [id number] Date of Birth/Sex: [birth year] (83 y.o. Male) Treating RN: Kela Millin Primary Care Provider: Sherrie Mustache Other Clinician: Referring Provider: Treating Provider/Extender:Shep Porter, [name], [name] in Treatment: 0 Debridement Performed for Wound #3 Lateral Toe Fourth Assessment: Performed By: Physician Ricard Dillon., MD Debridement Type: Debridement Level of Consciousness (Pre- Awake and Alert procedure): Pre-procedure Verification/Time Out Taken: Yes - 11:15 Start Time: 11:15 Pain Control: Other : benzocaine, 20% Total Area Debrided (L x W): 0.5 (cm) x 0.5 (cm) = 0.25 (cm) Tissue and other material Viable, Non-Viable, Callus, Subcutaneous debrided: Level: Skin/Subcutaneous Tissue Debridement Description: Excisional Instrument:  Curette Bleeding: Moderate Hemostasis Achieved: Silver Nitrate End Time: 11:16 Procedural Pain: 0 Post Procedural Pain: 0 Response to Treatment: Procedure was tolerated well Level of Consciousness Awake and Alert (Post-procedure): Post Debridement Measurements of Total Wound Length: (cm) 0.5 Width: (cm) 0.5 Depth: (cm) 0.1 Volume: (cm) 0.02 Character of Wound/Ulcer Post Improved Debridement: Post Procedure Diagnosis Same as Pre-procedure Electronic Signature(s) Signed: 03/03/2019 5:49:40 PM By: Kela Millin Signed: 03/03/2019 5:55:42 PM By: Linton Ham MD Entered By: Kela Millin on 03/03/2019 11:17:47 -------------------------------------------------------------------------------- HPI Details Patient Name: Date of Service: [name] 03/03/2019 10:30 AM Medical Record [id number] Patient Account Number: [id number] Date of Birth/Sex: December 13, 1935 (84 y.o. Male) Treating RN: Primary Care Provider: Sherrie Mustache Other Clinician: Referring Provider: Treating Provider/Extender:Jeania Nater, [name], [name] in Treatment: 0 History of Present Illness HPI Description: 04/02/17 this is a 84 year old man who is accompanied by his daughter who is his primary caregiver. Primary M.D. is Dr. [name] at [facility]. The patient was discovered to have a wound on the medial aspect of the right fifth toe during a primary doctor's visit. I think this is been present for 2-3 months. They've been using bed at betadine and then more recently Silvadene without any improvement. More recently they have just been separating the toe with dry gauze. The patient is not a diabetic what is listed his prediabetic. He is not a smoker. He is not having any history of vascular disease in his lower extremities. He has not had an x-ray of this area. They tell me that 2-3 years ago when he was still in upper and uric state he had a wound in the same area that took  a protracted length of time to heal. The patient has a history of pes planus deformity dating back into his childhood. The patient has a history of chronic cystitis and he has a chronic suprapubic catheter. He is listed as a prediabetic. 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 ABI in this  clinic is 1.2 on the right 04/12/17; this patient has a particularly difficult wound on the medial aspect of his fifth toe.he has had a previous wound in this area as well. 04/20/17-he is here for follow-up evaluation of a right fifth toe webspace ulcer. He is healed andwill be discharged [place] 03/07/2018; this is a 84 year old man who arrives accompanied by his daughter. He was in our clinic earlier this year with a wound between the fourth and fifth toes. This healed over apparently with offloading and Iodoflex. His current problem began roughly 2 weeks ago he started to complain of pain in the foot on the right. Initially it was felt to be on the outside of the foot but then the wound was discovered by the patient's aide while he was having a shower. The patient is not a diabetic but was recently diagnosed as having a sensorimotor polyneuropathy by neurology. The use the Iodoflex they had left over from last time more recent antibiotic ointment. Past medical history; patient is not a diabetic. He has pes planus deformity chronic chronic cystitis with a chronic Foley catheter, blindness apparently secondary to meningitis, hard of hearing with a cochlear implant on the left, BPH 2/11; still the area between the right fourth and fifth toes. Not much change this week we have been using Prisma. Changed to Iodoflex 2/18; still the area between the right fourth and fifth toes. Initially advertised is healed by her intake nurse however when I came in there was bleeding. There is not appear to be much of an open surface on this. 2/25;  still the area between the right fourth and fifth toes. Thick skin around this small circumference still present we have been using Iodoflex 3/10; the area between the fourth and fifth right toes has a surface over this. We have been using Iodoflex. He is not experiencing any pain. He has a nondiabetic idiopathic peripheral neuropathy. We have advised to keep his toes the fourth and fifth toes separated. The possibility that this represents a plantar wart is also there although this would need to be followed serially over time to determine this. READMISSION 03/03/2019 This is a patient we have had in this clinic on 2 other times. He has a wound between the right and fourth fifth toes. He is not a diabetic but he does have idiopathic peripheral neuropathy. These have always responded to silver alginate keeping the toes apart. When he was here the last time I wondered whether this could be an underlying plantar wart. I have never seen the proof of this toe. His daughter says that over the last 2 to 3 weeks they noted an increased size hyperkeratotic area that eventually fell off she has been applying hydrogen peroxide to it. He has been left with a open wound on the lateral aspect of the right fourth toe Electronic Signature(s) Signed: 03/03/2019 5:55:42 PM By: Linton Ham MD Entered By: Linton Ham on 03/03/2019 12:41:18 -------------------------------------------------------------------------------- Physical Exam Details Patient Name: Date of Service: [name] 03/03/2019 10:30 AM Medical Record [id number] Patient Account Number: [id number] Date of Birth/Sex: 19-Nov-1935 (84 y.o. Male) Treating RN: Primary Care Provider: Sherrie Mustache Other Clinician: Referring Provider: Treating Provider/Extender:Shyia Fillingim, [name], [name] in Treatment: 0 Constitutional Sitting or standing Blood Pressure is within target range for patient.. Pulse regular and within  target range for patient.Marland Kitchen Respirations regular, non-labored and within target range.. Temperature is normal and within the target range for the patient.Marland Kitchen Appears in no distress. Respiratory work of  breathing is normal. Cardiovascular Pedal pulses are palpable. Psychiatric appears at normal baseline. Notes Wound exam; the area in question is on the lateral part of the right fourth toe at the base and the fourth fifth toe webspace. This did not have a hyperkeratotic wartlike surface. Nonviable surface with a small probing hole. I used a #3 curette to remove skin and subcutaneous tissue from around the margins. Hemostasis with direct pressure and silver nitrate. I did not see characteristic gradual bleeding capillaries on the wound surface even with illumination Electronic Signature(s) Signed: 03/03/2019 5:55:42 PM By: Linton Ham MD Entered By: Linton Ham on 03/03/2019 12:42:43 -------------------------------------------------------------------------------- Physician Orders Details Patient Name: Date of Service: [name] 03/03/2019 10:30 AM Medical Record [id number] Patient Account Number: [id number] Date of Birth/Sex: 1935/05/14 (83 y.o. Male) Treating RN: Kela Millin Primary Care Provider: Sherrie Mustache Other Clinician: Referring Provider: Treating Provider/Extender:Brynnly Bonet, [name], [name] in Treatment: 0 Verbal / Phone Orders: No Diagnosis Coding Follow-up Appointments Return Appointment in 2 weeks. - Friday Dressing Change Frequency Change dressing every day. Wound Cleansing May shower and wash wound with soap and water. Primary Wound Dressing Wound #3 Lateral Toe Fourth Calcium Alginate with Silver Secondary Dressing Wound #3 Lateral Toe Fourth Dry Gauze - be sure to separate toes Electronic Signature(s) Signed: 03/03/2019 5:49:40 PM By: Kela Millin Signed: 03/03/2019 5:55:42 PM By: Linton Ham MD Entered By:  Kela Millin on 03/03/2019 11:19:12 -------------------------------------------------------------------------------- Problem List Details Patient Name: Date of Service: [name] 03/03/2019 10:30 AM Medical Record [id number] Patient Account Number: [id number] Date of Birth/Sex: Sep 02, 1935 (83 y.o. Male) Treating RN: Kela Millin Primary Care Provider: Sherrie Mustache Other Clinician: Referring Provider: Treating Provider/Extender:Burrell Hodapp, [name], [name] in Treatment: 0 Active Problems ICD-10 Evaluated Encounter Code Description Active Date Today Diagnosis L97.511 Non-pressure chronic ulcer of other part of right foot 03/03/2019 No Yes limited to breakdown of skin G90.09 Other idiopathic peripheral autonomic neuropathy 03/03/2019 No Yes Inactive Problems Resolved Problems Electronic Signature(s) Signed: 03/03/2019 5:49:40 PM By: Kela Millin Signed: 03/03/2019 5:55:42 PM By: Linton Ham MD Entered By: Kela Millin on 03/03/2019 12:54:53 -------------------------------------------------------------------------------- Progress Note Details Patient Name: Date of Service: [name] 03/03/2019 10:30 AM Medical Record [id number] Patient Account Number: [id number] Date of Birth/Sex: [birth year] (84 y.o. Male) Treating RN: Primary Care Provider: Sherrie Mustache Other Clinician: Referring Provider: Treating Provider/Extender:Petrina Melby, [name], [name] in Treatment: 0 Subjective Chief Complaint Information obtained from Patient He is here for follow up evaluation right fifth toe web space 03/07/2018; patient presents once again for a wound in the webspace between his right fifth and fourth toe. 03/03/2019. Patient again presents for a wound in the webspace between his right fifth and fourth toe History of Present Illness (HPI) 04/02/17 this is a 84 year old man who is accompanied by his daughter who is his primary  caregiver. Primary M.D. is Dr. [name] at [facility]. The patient was discovered to have a wound on the medial aspect of the right fifth toe during a primary doctor's visit. I think this is been present for 2-3 months. They've been using bed at betadine and then more recently Silvadene without any improvement. More recently they have just been separating the toe with dry gauze. The patient is not a diabetic what is listed his prediabetic. He is not a smoker. He is not having any history of vascular disease in his lower extremities. He has not had an x-ray of this area. They tell me that 2-3 years ago  when he was still in upper and uric state he had a wound in the same area that took a protracted length of time to heal. The patient has a history of pes planus deformity dating back into his childhood. The patient has a history of chronic cystitis and he has a chronic suprapubic catheter. He is listed as a prediabetic. 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 ABI in this clinic is 1.2 on the right 04/12/17; this patient has a particularly difficult wound on the medial aspect of his fifth toe.he has had a previous wound in this area as well. 04/20/17-he is here for follow-up evaluation of a right fifth toe webspace ulcer. He is healed andwill be discharged [place] 03/07/2018; this is a 84 year old man who arrives accompanied by his daughter. He was in our clinic earlier this year with a wound between the fourth and fifth toes. This healed over apparently with offloading and Iodoflex. His current problem began roughly 2 weeks ago he started to complain of pain in the foot on the right. Initially it was felt to be on the outside of the foot but then the wound was discovered by the patient's aide while he was having a shower. The patient is not a diabetic but was recently diagnosed as having a sensorimotor  polyneuropathy by neurology. The use the Iodoflex they had left over from last time more recent antibiotic ointment. Past medical history; patient is not a diabetic. He has pes planus deformity chronic chronic cystitis with a chronic Foley catheter, blindness apparently secondary to meningitis, hard of hearing with a cochlear implant on the left, BPH 2/11; still the area between the right fourth and fifth toes. Not much change this week we have been using Prisma. Changed to Iodoflex 2/18; still the area between the right fourth and fifth toes. Initially advertised is healed by her intake nurse however when I came in there was bleeding. There is not appear to be much of an open surface on this. 2/25; still the area between the right fourth and fifth toes. Thick skin around this small circumference still present we have been using Iodoflex 3/10; the area between the fourth and fifth right toes has a surface over this. We have been using Iodoflex. He is not experiencing any pain. He has a nondiabetic idiopathic peripheral neuropathy. We have advised to keep his toes the fourth and fifth toes separated. The possibility that this represents a plantar wart is also there although this would need to be followed serially over time to determine this. READMISSION 03/03/2019 This is a patient we have had in this clinic on 2 other times. He has a wound between the right and fourth fifth toes. He is not a diabetic but he does have idiopathic peripheral neuropathy. These have always responded to silver alginate keeping the toes apart. When he was here the last time I wondered whether this could be an underlying plantar wart. I have never seen the proof of this toe. His daughter says that over the last 2 to 3 weeks they noted an increased size hyperkeratotic area that eventually fell off she has been applying hydrogen peroxide to it. He has been left with a open wound on the lateral aspect of the right fourth  toe Patient History Information obtained from Patient. Allergies peanut (Reaction: throat closes up) Family History Heart Disease - Father, Hypertension - Mother, Stroke - Mother,Maternal Grandparents, No family history of Cancer, Diabetes,  Hereditary Spherocytosis, Kidney Disease, Lung Disease, Seizures, Thyroid Problems, Tuberculosis. Social History Former smoker - quit in 1967, Marital Status - Widowed, Alcohol Use - Rarely - 2 beer a month, Drug Use - No History, Caffeine Use - Daily - coffee. Medical History Eyes Patient has history of Cataracts Ear/Nose/Mouth/Throat Patient has history of Middle ear problems Denies history of Chronic sinus problems/congestion Respiratory Patient has history of Chronic Obstructive Pulmonary Disease (COPD) Cardiovascular Patient has history of Hypertension Gastrointestinal Denies history of Cirrhosis , Colitis, Crohnoos, Hepatitis A, Hepatitis B, Hepatitis C Genitourinary Denies history of End Stage Renal Disease Immunological Denies history of Lupus Erythematosus, Raynaudoos, Scleroderma Integumentary (Skin) Denies history of History of Burn Musculoskeletal Patient has history of Osteoarthritis Denies history of Gout, Rheumatoid Arthritis, Osteomyelitis Neurologic Patient has history of Neuropathy Denies history of Dementia, Quadriplegia, Paraplegia, Seizure Disorder Oncologic Denies history of Received Chemotherapy, Received Radiation Psychiatric Patient has history of Confinement Anxiety Denies history of Anorexia/bulimia Hospitalization/Surgery History - tonselectomy. - appendectomy. - tetiticular cancer. - suprapubic cath placement. - bladder stones removed. Medical And Surgical History Notes Eyes blind Ear/Nose/Mouth/Throat cochlear implant Cardiovascular hyperlipidemia Genitourinary suprapubic cath Neurologic hx meningitis 2015 Oncologic testicular cancer, skin CA Review of Systems (ROS) Constitutional Symptoms  (General Health) Denies complaints or symptoms of Fatigue, Fever, Chills, Marked Weight Change. Eyes Denies complaints or symptoms of Dry Eyes, Vision Changes, Glasses / Contacts. Cardiovascular Denies complaints or symptoms of Chest pain. Gastrointestinal Denies complaints or symptoms of Frequent diarrhea, Nausea, Vomiting. Endocrine Denies complaints or symptoms of Heat/cold intolerance. Integumentary (Skin) Denies complaints or symptoms of Wounds. Musculoskeletal Complains or has symptoms of Muscle Weakness. Denies complaints or symptoms of Muscle Pain. Neurologic Denies complaints or symptoms of Numbness/parasthesias. Psychiatric Denies complaints or symptoms of Claustrophobia, Suicidal. Objective Constitutional Sitting or standing Blood Pressure is within target range for patient.. Pulse regular and within target range for patient.Marland Kitchen Respirations regular, non-labored and within target range.. Temperature is normal and within the target range for the patient.Marland Kitchen Appears in no distress. Vitals Time Taken: 10:38 AM, Height: 72 in, Source: Stated, Weight: 183 lbs, Source: Stated, BMI: 24.8, Temperature: 98.6 F, Pulse: 69 bpm, Respiratory Rate: 18 breaths/min, Blood Pressure: 130/59 mmHg. Respiratory work of breathing is normal. Cardiovascular Pedal pulses are palpable. Psychiatric appears at normal baseline. General Notes: Wound exam; the area in question is on the lateral part of the right fourth toe at the base and the fourth fifth toe webspace. This did not have a hyperkeratotic wartlike surface. Nonviable surface with a small probing hole. I used a #3 curette to remove skin and subcutaneous tissue from around the margins. Hemostasis with direct pressure and silver nitrate. I did not see characteristic gradual bleeding capillaries on the wound surface even with illumination Integumentary (Hair, Skin) Wound #3 status is Open. Original cause of wound was Gradually Appeared. The  wound is located on the Lateral Toe Fourth. The wound measures 0.5cm length x 0.5cm width x 0.1cm depth; 0.196cm^2 area and 0.02cm^3 volume. There is Fat Layer (Subcutaneous Tissue) Exposed exposed. There is no tunneling or undermining noted. There is a small amount of sanguinous drainage noted. The wound margin is distinct with the outline attached to the wound base. There is large (67-100%) red, pink granulation within the wound bed. There is no necrotic tissue within the wound bed. Assessment Active Problems ICD-10 Non-pressure chronic ulcer of other part of right foot limited to breakdown of skin Other idiopathic peripheral autonomic neuropathy Procedures Wound #3 Pre-procedure diagnosis of Wound #3  is an Atypical located on the Lateral Toe Fourth . There was a Excisional Skin/Subcutaneous Tissue Debridement with a total area of 0.25 sq cm performed by Ricard Dillon., MD. With the following instrument(s): Curette to remove Viable and Non-Viable tissue/material. Material removed includes Callus and Subcutaneous Tissue and after achieving pain control using Other (benzocaine, 20%). No specimens were taken. A time out was conducted at 11:15, prior to the start of the procedure. A Moderate amount of bleeding was controlled with Silver Nitrate. The procedure was tolerated well with a pain level of 0 throughout and a pain level of 0 following the procedure. Post Debridement Measurements: 0.5cm length x 0.5cm width x 0.1cm depth; 0.02cm^3 volume. Character of Wound/Ulcer Post Debridement is improved. Post procedure Diagnosis Wound #3: Same as Pre-Procedure Plan Follow-up Appointments: Return Appointment in 2 weeks. - Friday Dressing Change Frequency: Change dressing every day. Wound Cleansing: May shower and wash wound with soap and water. Primary Wound Dressing: Wound #3 Lateral Toe Fourth: Calcium Alginate with Silver Secondary Dressing: Wound #3 Lateral Toe Fourth: Dry Gauze -  be sure to separate toes 1. Again we use silver alginate and gauze to keep the toes separated will change this every second day 2. I am really not certain whether this represents a plantar wart or not. I have never seen the concrete. For this. Apparently there was an expanding hyperkeratotic cover this that came off although I do not see the typical bleeding capillaries underneath this. 3. I have told him that the better approach might be to see podiatry the next time this happens before attempting to remove the surface. They could try salicylic acid carefully painted on the surface 4. I see no evidence of infection Electronic Signature(s) Signed: 03/03/2019 5:55:42 PM By: Linton Ham MD Entered By: Linton Ham on 03/03/2019 12:44:29 -------------------------------------------------------------------------------- HxROS Details Patient Name: Date of Service: [name] 03/03/2019 10:30 AM Medical Record [id number] Patient Account Number: [id number] Date of Birth/Sex: 1935/10/29 (83 y.o. Male) Treating RN: Baruch Gouty Primary Care Provider: Sherrie Mustache Other Clinician: Referring Provider: Treating Provider/Extender:Jewel Mcafee, [name], [name] in Treatment: 0 Information Obtained From Patient Constitutional Symptoms (General Health) Complaints and Symptoms: Negative for: Fatigue; Fever; Chills; Marked Weight Change Eyes Complaints and Symptoms: Negative for: Dry Eyes; Vision Changes; Glasses / Contacts Medical History: Positive for: Cataracts Past Medical History Notes: blind Cardiovascular Complaints and Symptoms: Negative for: Chest pain Medical History: Positive for: Hypertension Past Medical History Notes: hyperlipidemia Gastrointestinal Complaints and Symptoms: Negative for: Frequent diarrhea; Nausea; Vomiting Medical History: Negative for: Cirrhosis ; Colitis; Crohns; Hepatitis A; Hepatitis B; Hepatitis C Endocrine Complaints  and Symptoms: Negative for: Heat/cold intolerance Integumentary (Skin) Complaints and Symptoms: Negative for: Wounds Medical History: Negative for: History of Burn Musculoskeletal Complaints and Symptoms: Positive for: Muscle Weakness Negative for: Muscle Pain Medical History: Positive for: Osteoarthritis Negative for: Gout; Rheumatoid Arthritis; Osteomyelitis Neurologic Complaints and Symptoms: Negative for: Numbness/parasthesias Medical History: Positive for: Neuropathy Negative for: Dementia; Quadriplegia; Paraplegia; Seizure Disorder Past Medical History Notes: hx meningitis 2015 Psychiatric Complaints and Symptoms: Negative for: Claustrophobia; Suicidal Medical History: Positive for: Confinement Anxiety Negative for: Anorexia/bulimia Ear/Nose/Mouth/Throat Medical History: Positive for: Middle ear problems Negative for: Chronic sinus problems/congestion Past Medical History Notes: cochlear implant Hematologic/Lymphatic Respiratory Medical History: Positive for: Chronic Obstructive Pulmonary Disease (COPD) Genitourinary Medical History: Negative for: End Stage Renal Disease Past Medical History Notes: suprapubic cath Immunological Medical History: Negative for: Lupus Erythematosus; Raynauds; Scleroderma Oncologic Medical History: Negative for: Received Chemotherapy; Received Radiation  Past Medical History Notes: testicular cancer, skin CA HBO Extended History Items Eyes: Ear/Nose/Mouth/Throat: Cataracts Middle ear problems Immunizations Pneumococcal Vaccine: Received Pneumococcal Vaccination: Yes Implantable Devices Yes Hospitalization / Surgery History Type of Hospitalization/Surgery tonselectomy appendectomy tetiticular cancer suprapubic cath placement bladder stones removed Family and Social History Cancer: No; Diabetes: No; Heart Disease: Yes - Father; Hereditary Spherocytosis: No; Hypertension: Yes - Mother; Kidney Disease: No; Lung  Disease: No; Seizures: No; Stroke: Yes - Mother,Maternal Grandparents; Thyroid Problems: No; Tuberculosis: No; Former smoker - quit in 1967; Marital Status - Widowed; Alcohol Use: Rarely - 2 beer a month; Drug Use: No History; Caffeine Use: Daily - coffee; Financial Concerns: No; Food, Clothing or Shelter Needs: No; Support System Lacking: No; Transportation Concerns: No Electronic Signature(s) Signed: 03/03/2019 5:55:15 PM By: Baruch Gouty RN, BSN Signed: 03/03/2019 5:55:42 PM By: Linton Ham MD Entered By: Baruch Gouty on 03/03/2019 10:50:06 -------------------------------------------------------------------------------- [place] Details Patient Name: Date of Service: [name] 03/03/2019 Medical Record [id number] Patient Account Number: [id number] Date of Birth/Sex: Treating RN: Oct 14, 1935 (84 y.o. Male) Primary Care Provider: Sherrie Mustache Other Clinician: Referring Provider: Treating Provider/Extender:Clydene Burack, [name], [name] in Treatment: 0 Diagnosis Coding ICD-10 Codes Code Description L97.511 Non-pressure chronic ulcer of other part of right foot limited to breakdown of skin G90.09 Other idiopathic peripheral autonomic neuropathy Facility Procedures CPT4 Code Description: [id number] [place] VISIT-LEV 3 EST PT [id number] - DEB SUBQ TISSUE 20 SQ CM/< ICD-10 Diagnosis Description L97.511 Non-pressure chronic ulcer of other part of right foot li Modifier: 25 mited to break Quantity: 1 1 down of skin Physician Procedures CPT4 Code Description: [id number] - WC PHYS LEVEL 3 - EST PT ICD-10 Diagnosis Description L97.511 Non-pressure chronic ulcer of other part of right foot li G90.09 Other idiopathic peripheral autonomic neuropathy Modifier: 25 mited to breakd Quantity: 1 own of skin CPT4 Code Description: [id number] - WC PHYS SUBQ TISS 20 SQ CM ICD-10 Diagnosis Description L97.511 Non-pressure chronic ulcer of other part of  right foot li Modifier: mited to breakd Quantity: 1 own of skin Electronic Signature(s) Signed: 03/03/2019 5:49:40 PM By: Kela Millin Signed: 03/03/2019 5:55:42 PM By: Linton Ham MD Entered By: Kela Millin on 03/03/2019 12:55:39

## 2019-03-03 NOTE — Progress Notes (Signed)
HUSANI, HELFER ([id number]) Visit Report for 03/03/2019 Abuse/Suicide Risk Screen Details Patient Name: Date of Service: Ricardo [name], Ricardo [name] 03/03/2019 10:30 AM Medical Record [id number] Patient Account Number: [id number] Date of Birth/Sex: August 02, 1935 (83 y.o. Male) Treating RN: Baruch Gouty Primary Care TRUE [place]: Sherrie Mustache Other Clinician: Referring Taija Mathias: Treating Moe Brier/Extender:Robson, [name], [name] in Treatment: 0 Abuse/Suicide Risk Screen Items Answer ABUSE RISK SCREEN: Has anyone close to you tried to hurt or harm you recentlyo No Do you feel uncomfortable with anyone in your familyo No Has anyone forced you do things that you didnt want to doo No Electronic Signature(s) Signed: 03/03/2019 5:55:15 PM By: Baruch Gouty RN, BSN Entered By: Baruch Gouty on 03/03/2019 10:50:15 -------------------------------------------------------------------------------- Activities of Daily Living Details Patient Name: Date of Service: Ricardo [name], Ricardo [name] 03/03/2019 10:30 AM Medical Record [id number] Patient Account Number: [id number] Date of Birth/Sex: Jun 08, 1935 (83 y.o. Male) Treating RN: Baruch Gouty Primary Care Clayton Bosserman: Sherrie Mustache Other Clinician: Referring Artemus Romanoff: Treating Emeree Mahler/Extender:Robson, [name], [name] in Treatment: 0 Activities of Daily Living Items Answer Activities of Daily Living (Please select one for each item) Drive Automobile Not Able Take Medications Need Assistance Use Telephone Need Assistance Care for Appearance Need Assistance Use Toilet Need Assistance Bath / Shower Need Assistance Dress Self Need Assistance Feed Self Completely Able Walk Not Able Get In / Out Bed Need Assistance Housework Not Able Prepare Meals Not Able Handle Money Need Assistance Shop for Self Not Able Electronic Signature(s) Signed: 03/03/2019 5:55:15 PM By: Baruch Gouty RN, BSN Entered By: Baruch Gouty on 03/03/2019 10:50:48 -------------------------------------------------------------------------------- Education Screening Details Patient Name: Date of Service: Ricardo [name] 03/03/2019 10:30 AM Medical Record [id number] Patient Account Number: [id number] Date of Birth/Sex: 27-Feb-1935 (83 y.o. Male) Treating RN: Baruch Gouty Primary Care Adelheid Hoggard: Sherrie Mustache Other Clinician: Referring Kaislee Chao: Treating Karizma Cheek/Extender:Robson, [name], [name] in Treatment: 0 Primary Learner Assessed: Patient Learning Preferences/Education Level/Primary Language Learning Preference: Explanation, Demonstration, Printed Material Highest Education Level: College or Above Preferred Language: English Cognitive Barrier Language Barrier: No Translator Needed: No Memory Deficit: No Emotional Barrier: No Cultural/Religious Beliefs Affecting Medical Care: No Physical Barrier Impaired Vision: Yes Legally Blind Impaired Hearing: Yes Hearing Aid, coclear implant Decreased Hand dexterity: No Knowledge/Comprehension Knowledge Level: High Comprehension Level: High Ability to understand written High instructions: Ability to understand verbal High instructions: Motivation Anxiety Level: Calm Cooperation: Cooperative Education Importance: Acknowledges Need Interest in Health Problems: Asks Questions Perception: Coherent Willingness to Engage in Self- High Management Activities: Readiness to Engage in Self- High Management Activities: Electronic Signature(s) Signed: 03/03/2019 5:55:15 PM By: Baruch Gouty RN, BSN Entered By: Baruch Gouty on 03/03/2019 10:51:44 -------------------------------------------------------------------------------- Fall Risk Assessment Details Patient Name: Date of Service: Ricardo [name] 03/03/2019 10:30 AM Medical Record [id number] Patient Account Number: [id number] Date of Birth/Sex: August 09, 1935 (84 y.o. Male)  Treating RN: Baruch Gouty Primary Care Gates Jividen: Sherrie Mustache Other Clinician: Referring Makael Stein: Treating Elvis Boot/Extender:Robson, [name], [name] in Treatment: 0 Fall Risk Assessment Items Have you had 2 or more falls in the last 12 monthso 0 No Have you had any fall that resulted in injury in the last 12 monthso 0 No FALLS RISK SCREEN History of falling - immediate or within 3 months 0 No Secondary diagnosis (Do you have 2 or more medical diagnoseso) 0 No Ambulatory aid None/bed rest/wheelchair/nurse 0 Yes Crutches/cane/walker 0 No Furniture 0 No Intravenous therapy Access/Saline/Heparin Lock 0 No Weak (short steps with or without shuffle, stooped but able to lift head 0 No while walking,  may seek support from furniture) Impaired (short steps with shuffle, may have difficulty arising from chair, 0 No head down, impaired balance) Mental Status Oriented to own ability 0 Yes Overestimates or forgets limitations 0 No Risk Level: Low Risk Score: 0 Electronic Signature(s) Signed: 03/03/2019 5:55:15 PM By: Baruch Gouty RN, BSN Entered By: Baruch Gouty on 03/03/2019 10:52:24 -------------------------------------------------------------------------------- Foot Assessment Details Patient Name: Date of Service: Ricardo [name] 03/03/2019 10:30 AM Medical Record [id number] Patient Account Number: [id number] Date of Birth/Sex: 03-Apr-1935 (83 y.o. Male) Treating RN: Baruch Gouty Primary Care Amilah Greenspan: Sherrie Mustache Other Clinician: Referring Jaylnn Ullery: Treating Deandra Gadson/Extender:Robson, [name], [name] in Treatment: 0 Foot Assessment Items Site Locations + = Sensation present, - = Sensation absent, C = Callus, U = Ulcer R = Redness, W = Warmth, M = Maceration, PU = Pre-ulcerative lesion F = Fissure, S = Swelling, D = Dryness Assessment Right: Left: Other Deformity: No No Prior Foot Ulcer: Yes No Prior Amputation: No  No Charcot Joint: No No Ambulatory Status: Ambulatory Without Help Gait: Steady Electronic Signature(s) Signed: 03/03/2019 5:55:15 PM By: Baruch Gouty RN, BSN Entered By: Baruch Gouty on 03/03/2019 10:54:56 -------------------------------------------------------------------------------- Nutrition Risk Screening Details Patient Name: Date of Service: Ricardo [name] 03/03/2019 10:30 AM Medical Record [id number] Patient Account Number: [id number] Date of Birth/Sex: 1935/04/02 (83 y.o. Male) Treating RN: Baruch Gouty Primary Care Ardine Iacovelli: Sherrie Mustache Other Clinician: Referring Deajah Erkkila: Treating Chapel Silverthorn/Extender:Robson, [name], [name] in Treatment: 0 Height (in): 72 Weight (lbs): 183 Body Mass Index (BMI): 24.8 Nutrition Risk Screening Items Score Screening NUTRITION RISK SCREEN: I have an illness or condition that made me change the kind and/or 0 No amount of food I eat I eat fewer than two meals per day 0 No I eat few fruits and vegetables, or milk products 0 No I have three or more drinks of beer, liquor or wine almost every day 0 No I have tooth or mouth problems that make it hard for me to eat 0 No I don't always have enough money to buy the food I need 0 No I eat alone most of the time 0 No I take three or more different prescribed or over-the-counter drugs a day 1 Yes 0 No Without wanting to, I have lost or gained 10 pounds in the last six months I am not always physically able to shop, cook and/or feed myself 0 No Nutrition Protocols Good Risk Protocol 0 No interventions needed Moderate Risk Protocol High Risk Proctocol Risk Level: Good Risk Score: 1 Electronic Signature(s) Signed: 03/03/2019 5:55:15 PM By: Baruch Gouty RN, BSN Entered By: Baruch Gouty on 03/03/2019 10:53:08

## 2019-03-07 DIAGNOSIS — S91309D Unspecified open wound, unspecified foot, subsequent encounter: Secondary | ICD-10-CM | POA: Diagnosis not present

## 2019-03-09 ENCOUNTER — Other Ambulatory Visit: Payer: Self-pay | Admitting: Nurse Practitioner

## 2019-03-09 DIAGNOSIS — G609 Hereditary and idiopathic neuropathy, unspecified: Secondary | ICD-10-CM

## 2019-03-17 ENCOUNTER — Encounter (HOSPITAL_BASED_OUTPATIENT_CLINIC_OR_DEPARTMENT_OTHER): Payer: Medicare HMO | Admitting: Internal Medicine

## 2019-03-21 ENCOUNTER — Encounter (HOSPITAL_BASED_OUTPATIENT_CLINIC_OR_DEPARTMENT_OTHER): Payer: Medicare HMO | Attending: Internal Medicine | Admitting: Internal Medicine

## 2019-03-21 ENCOUNTER — Other Ambulatory Visit: Payer: Self-pay

## 2019-03-21 DIAGNOSIS — F419 Anxiety disorder, unspecified: Secondary | ICD-10-CM | POA: Insufficient documentation

## 2019-03-21 DIAGNOSIS — L859 Epidermal thickening, unspecified: Secondary | ICD-10-CM | POA: Diagnosis not present

## 2019-03-21 DIAGNOSIS — M1990 Unspecified osteoarthritis, unspecified site: Secondary | ICD-10-CM | POA: Diagnosis not present

## 2019-03-21 DIAGNOSIS — I10 Essential (primary) hypertension: Secondary | ICD-10-CM | POA: Insufficient documentation

## 2019-03-21 DIAGNOSIS — R69 Illness, unspecified: Secondary | ICD-10-CM | POA: Diagnosis not present

## 2019-03-21 DIAGNOSIS — N400 Enlarged prostate without lower urinary tract symptoms: Secondary | ICD-10-CM | POA: Diagnosis not present

## 2019-03-21 DIAGNOSIS — G9009 Other idiopathic peripheral autonomic neuropathy: Secondary | ICD-10-CM | POA: Diagnosis not present

## 2019-03-21 DIAGNOSIS — J449 Chronic obstructive pulmonary disease, unspecified: Secondary | ICD-10-CM | POA: Diagnosis not present

## 2019-03-21 DIAGNOSIS — L97519 Non-pressure chronic ulcer of other part of right foot with unspecified severity: Secondary | ICD-10-CM | POA: Diagnosis not present

## 2019-03-21 DIAGNOSIS — L97511 Non-pressure chronic ulcer of other part of right foot limited to breakdown of skin: Secondary | ICD-10-CM | POA: Insufficient documentation

## 2019-03-21 DIAGNOSIS — N3020 Other chronic cystitis without hematuria: Secondary | ICD-10-CM | POA: Insufficient documentation

## 2019-03-22 NOTE — Progress Notes (Signed)
Ricardo [name], Ricardo [name] ([id number]) Visit Report for 03/21/2019 HPI Details Patient Name: Date of Service: Ricardo [name], Ricardo [name] 03/21/2019 10:00 AM Medical Record [id number] Patient Account Number: [id number] Date of Birth/Sex: Treating RN: 01/27/1936 (83 y.o. Ricardo [name]) Ricardo [name] Primary Care Provider: Sherrie [name] Other Clinician: Referring Provider: Treating Provider/Extender:Ricardo [name], Ricardo [name], Ricardo [name] in Treatment: 2 History of Present Illness HPI Description: 04/02/17 this is a 84 year old man who is accompanied by his daughter who is his primary caregiver. Primary M.D. is Dr. Gildardo [name] at [facility]. The patient was discovered to have a wound on the medial aspect of the right fifth toe during a primary doctor's visit. I think this is been present for 2-3 months. They've been using bed at betadine and then more recently Silvadene without any improvement. More recently they have just been separating the toe with dry gauze. The patient is not a diabetic what is listed his prediabetic. He is not a smoker. He is not having any history of vascular disease in his lower extremities. He has not had an x-ray of this area. They tell me that 2-3 years ago when he was still in upper and uric state he had a wound in the same area that took a protracted length of time to heal. The patient has a history of pes planus deformity dating back into his childhood. The patient has a history of chronic cystitis and he has a chronic suprapubic catheter. He is listed as a prediabetic. 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 ABI in this clinic is 1.2 on the right 04/12/17; this patient has a particularly difficult wound on the medial aspect of his fifth toe.he has had a previous wound in this area as well. 04/20/17-he is here for follow-up evaluation of a right fifth toe webspace ulcer. He is healed andwill be  discharged [name] 03/07/2018; this is a 84 year old man who arrives accompanied by his daughter. He was in our clinic earlier this year with a wound between the fourth and fifth toes. This healed over apparently with offloading and Iodoflex. His current problem began roughly 2 weeks ago he started to complain of pain in the foot on the right. Initially it was felt to be on the outside of the foot but then the wound was discovered by the patient's aide while he was having a shower. The patient is not a diabetic but was recently diagnosed as having a sensorimotor polyneuropathy by neurology. The use the Iodoflex they had left over from last time more recent antibiotic ointment. Past medical history; patient is not a diabetic. He has pes planus deformity chronic chronic cystitis with a chronic Foley catheter, blindness apparently secondary to meningitis, hard of hearing with a cochlear implant on the left, BPH 2/11; still the area between the right fourth and fifth toes. Not much change this week we have been using Prisma. Changed to Iodoflex 2/18; still the area between the right fourth and fifth toes. Initially advertised is healed by her intake nurse however when I came in there was bleeding. There is not appear to be much of an open surface on this. 2/25; still the area between the right fourth and fifth toes. Thick skin around this small circumference still present we have been using Iodoflex 3/10; the area between the fourth and fifth right toes has a surface over this. We have been using Iodoflex. He is not experiencing any pain. He has a nondiabetic idiopathic peripheral neuropathy.  We have advised to keep his toes the fourth and fifth toes separated. The possibility that this represents a plantar wart is also there although this would need to be followed serially over time to determine this. READMISSION 03/03/2019 This is a patient we have had in this clinic on 2 other times. He has a  wound between the right and fourth fifth toes. He is not a diabetic but he does have idiopathic peripheral neuropathy. These have always responded to silver alginate keeping the toes apart. When he was here the last time I wondered whether this could be an underlying plantar wart. I have never seen the proof of this toe. His daughter says that over the last 2 to 3 weeks they noted an increased size hyperkeratotic area that eventually fell off she has been applying hydrogen peroxide to it. He has been left with a open wound on the lateral aspect of the right fourth toe 2/16; this is an area between the right fourth and fifth toes in the webspace. Again he has a hyperkeratotic area here. There has been some some suspicion that this is a plantar wart although it certainly does not have the papillary bleeding typical of this. They are keeping his toes separated. Nothing looks particularly open today. Electronic Signature(s) Signed: 03/21/2019 5:18:57 PM By: Linton Ham MD Entered By: Linton Ham on 03/21/2019 11:47:49 -------------------------------------------------------------------------------- Physical Exam Details Patient Name: Date of Service: Ricardo [name] 03/21/2019 10:00 AM Medical Record [id number] Patient Account Number: [id number] Date of Birth/Sex: Treating RN: 1935/06/01 (83 y.o. [name] Primary Care Provider: Sherrie [name] Other Clinician: Referring Provider: Treating Provider/Extender:Ricardo [name], Ricardo [name], Ricardo [name] in Treatment: 2 Constitutional Sitting or standing Blood Pressure is within target range for patient.. Pulse regular and within target range for patient.Marland Kitchen Respirations regular, non-labored and within target range.. Temperature is normal and within the target range for the patient.Marland Kitchen Appears in no distress. Notes Wound exam; area questions on the lateral part of the right fourth toe at the base between the fourth and fifth  toe webspace. This again has a hyperkeratotic but very superficial surface. I do not see any evidence of subdermal bleeding or papillary's. Electronic Signature(s) Signed: 03/21/2019 5:18:57 PM By: Linton Ham MD Entered By: Linton Ham on 03/21/2019 11:49:34 -------------------------------------------------------------------------------- Physician Orders Details Patient Name: Date of Service: Ricardo [name] 03/21/2019 10:00 AM Medical Record [id number] Patient Account Number: [id number] Date of Birth/Sex: Treating RN: 12-24-1935 (83 y.o. Ricardo [name]) Ricardo [name] Primary Care Provider: Other Clinician: Sherrie [name] Referring Provider: Treating Provider/Extender:Shadonna Benedick, Ricardo [name], Ricardo [name] in Treatment: 2 Verbal / Phone Orders: No Diagnosis Coding ICD-10 Coding Code Description L97.511 Non-pressure chronic ulcer of other part of right foot limited to breakdown of skin G90.09 Other idiopathic peripheral autonomic neuropathy Discharge From [facility] Services Discharge from [place] Signature(s) Signed: 03/21/2019 5:18:57 PM By: Linton Ham MD Signed: 03/22/2019 5:45:19 PM By: Ricardo Coria RN Entered By: Ricardo [name] on 03/21/2019 11:31:22 -------------------------------------------------------------------------------- Problem List Details Patient Name: Date of Service: Ricardo [name] 03/21/2019 10:00 AM Medical Record [id number] Patient Account Number: [id number] Date of Birth/Sex: Treating RN: [birth year] (83 y.o. [name] Primary Care Provider: Sherrie [name] Other Clinician: Referring Provider: Treating Provider/Extender:Devyne Hauger, Ricardo [name], Ricardo [name] in Treatment: 2 Active Problems ICD-10 Evaluated Encounter Code Description Active Date Today Diagnosis L97.511 Non-pressure chronic ulcer of other part of right foot 03/03/2019 No Yes limited to breakdown of skin G90.09 Other idiopathic peripheral autonomic  neuropathy 03/03/2019 No Yes Inactive Problems Resolved  Problems Electronic Signature(s) Signed: 03/21/2019 5:18:57 PM By: Linton Ham MD Entered By: Linton Ham on 03/21/2019 11:46:51 -------------------------------------------------------------------------------- Progress Note Details Patient Name: Date of Service: Ricardo [name] 03/21/2019 10:00 AM Medical Record [id number] Patient Account Number: [id number] Date of Birth/Sex: Treating RN: 1935/09/02 (83 y.o. Ricardo [name]) Ricardo [name] Primary Care Provider: Sherrie [name] Other Clinician: Referring Provider: Treating Provider/Extender:Cayne Yom, Ricardo [name], Ricardo [name] in Treatment: 2 Subjective History of Present Illness (HPI) 04/02/17 this is a 84 year old man who is accompanied by his daughter who is his primary caregiver. Primary M.D. is Dr. Gildardo [name] at [facility]. The patient was discovered to have a wound on the medial aspect of the right fifth toe during a primary doctor's visit. I think this is been present for 2-3 months. They've been using bed at betadine and then more recently Silvadene without any improvement. More recently they have just been separating the toe with dry gauze. The patient is not a diabetic what is listed his prediabetic. He is not a smoker. He is not having any history of vascular disease in his lower extremities. He has not had an x-ray of this area. They tell me that 2-3 years ago when he was still in upper and uric state he had a wound in the same area that took a protracted length of time to heal. The patient has a history of pes planus deformity dating back into his childhood. The patient has a history of chronic cystitis and he has a chronic suprapubic catheter. He is listed as a prediabetic. 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 ABI in this clinic is 1.2 on the right 04/12/17; this patient has  a particularly difficult wound on the medial aspect of his fifth toe.he has had a previous wound in this area as well. 04/20/17-he is here for follow-up evaluation of a right fifth toe webspace ulcer. He is healed andwill be discharged [place] 03/07/2018; this is a 84 year old man who arrives accompanied by his daughter. He was in our clinic earlier this year with a wound between the fourth and fifth toes. This healed over apparently with offloading and Iodoflex. His current problem began roughly 2 weeks ago he started to complain of pain in the foot on the right. Initially it was felt to be on the outside of the foot but then the wound was discovered by the patient's aide while he was having a shower. The patient is not a diabetic but was recently diagnosed as having a sensorimotor polyneuropathy by neurology. The use the Iodoflex they had left over from last time more recent antibiotic ointment. Past medical history; patient is not a diabetic. He has pes planus deformity chronic chronic cystitis with a chronic Foley catheter, blindness apparently secondary to meningitis, hard of hearing with a cochlear implant on the left, BPH 2/11; still the area between the right fourth and fifth toes. Not much change this week we have been using Prisma. Changed to Iodoflex 2/18; still the area between the right fourth and fifth toes. Initially advertised is healed by her intake nurse however when I came in there was bleeding. There is not appear to be much of an open surface on this. 2/25; still the area between the right fourth and fifth toes. Thick skin around this small circumference still present we have been using Iodoflex 3/10; the area between the fourth and fifth right toes has a surface over this. We have been using Iodoflex. He  is not experiencing any pain. He has a nondiabetic idiopathic peripheral neuropathy. We have advised to keep his toes the fourth and fifth toes separated. The possibility  that this represents a plantar wart is also there although this would need to be followed serially over time to determine this. READMISSION 03/03/2019 This is a patient we have had in this clinic on 2 other times. He has a wound between the right and fourth fifth toes. He is not a diabetic but he does have idiopathic peripheral neuropathy. These have always responded to silver alginate keeping the toes apart. When he was here the last time I wondered whether this could be an underlying plantar wart. I have never seen the proof of this toe. His daughter says that over the last 2 to 3 weeks they noted an increased size hyperkeratotic area that eventually fell off she has been applying hydrogen peroxide to it. He has been left with a open wound on the lateral aspect of the right fourth toe 2/16; this is an area between the right fourth and fifth toes in the webspace. Again he has a hyperkeratotic area here. There has been some some suspicion that this is a plantar wart although it certainly does not have the papillary bleeding typical of this. They are keeping his toes separated. Nothing looks particularly open today. Objective Constitutional Sitting or standing Blood Pressure is within target range for patient.. Pulse regular and within target range for patient.Marland Kitchen Respirations regular, non-labored and within target range.. Temperature is normal and within the target range for the patient.Marland Kitchen Appears in no distress. Vitals Time Taken: 10:36 AM, Height: 72 in, Weight: 183 lbs, BMI: 24.8, Temperature: 97.8 F, Pulse: 66 bpm, Respiratory Rate: 16 breaths/min, Blood Pressure: 119/42 mmHg. General Notes: Wound exam; area questions on the lateral part of the right fourth toe at the base between the fourth and fifth toe webspace. This again has a hyperkeratotic but very superficial surface. I do not see any evidence of subdermal bleeding or papillary's. Integumentary (Hair, Skin) Wound #3 status is Healed  - Epithelialized. Original cause of wound was Gradually Appeared. The wound is located on the Lateral Toe Fourth. The wound measures 0cm length x 0cm width x 0cm depth; 0cm^2 area and 0cm^3 volume. There is no tunneling or undermining noted. There is a none present amount of drainage noted. The wound margin is distinct with the outline attached to the wound base. There is no granulation within the wound bed. There is no necrotic tissue within the wound bed. Assessment Active Problems ICD-10 Non-pressure chronic ulcer of other part of right foot limited to breakdown of skin Other idiopathic peripheral autonomic neuropathy Plan Discharge From [facility] Services: Discharge from [place] 1. I think the patient can be discharged from the wound care center. 2. We counseled keeping the toes separated they are already doing this Electronic Signature(s) Signed: 03/21/2019 5:18:57 PM By: Linton Ham MD Entered By: Linton Ham on 03/21/2019 11:50:23 -------------------------------------------------------------------------------- SuperBill Details Patient Name: Date of Service: Ricardo [name] 03/21/2019 Medical Record [id number] Patient Account Number: [id number] Date of Birth/Sex: Treating RN: 11/23/1935 (83 y.o. [name] Primary Care Provider: Sherrie [name] Other Clinician: Referring Provider: Treating Provider/Extender:Rjay Revolorio, Ricardo [name], Ricardo [name] in Treatment: 2 Diagnosis Coding ICD-10 Codes Code Description L97.511 Non-pressure chronic ulcer of other part of right foot limited to breakdown of skin G90.09 Other idiopathic peripheral autonomic neuropathy Facility Procedures CPT4 Code: [id number] Description: [id number] - WOUND CARE VISIT-LEV 2 EST PT Modifier: Quantity:  1 Physician Procedures CPT4 Code Description: [id number] - WC PHYS LEVEL 2 - EST PT ICD-10 Diagnosis Description L97.511 Non-pressure chronic ulcer of other part of right foot l G90.09  Other idiopathic peripheral autonomic neuropathy Modifier: imited to breakd Quantity: 1 own of skin Electronic Signature(s) Signed: 03/21/2019 5:18:57 PM By: Linton Ham MD Entered By: Linton Ham on 03/21/2019 11:50:37

## 2019-03-28 DIAGNOSIS — R339 Retention of urine, unspecified: Secondary | ICD-10-CM | POA: Diagnosis not present

## 2019-03-28 DIAGNOSIS — Z466 Encounter for fitting and adjustment of urinary device: Secondary | ICD-10-CM | POA: Diagnosis not present

## 2019-03-30 NOTE — Progress Notes (Signed)
Ricardo [name] ([id number]) Visit Report for 03/21/2019 Arrival Information Details Patient Name: Date of Service: Ricardo [name], Ricardo [name] 03/21/2019 10:00 AM Medical Record [id number] Patient Account Number: [id number] Date of Birth/Sex: Treating RN: 10/25/1935 (84 y.o. Janyth Contes Primary Care Toma Erichsen: Sherrie Mustache Other Clinician: Referring Aldena Worm: Treating Jovan Colligan/Extender:Robson, [name], [name] in Treatment: 2 Visit Information History Since Last Visit Added or deleted any medications: No Patient Arrived: Wheel Chair Any new allergies or adverse reactions: No Arrival Time: 10:33 Had a fall or experienced change in No activities of daily living that may affect Accompanied By: daughter risk of falls: Transfer Assistance: None Signs or symptoms of abuse/neglect since last No Patient Identification Verified: Yes visito Secondary Verification Process Yes Hospitalized since last visit: No Completed: Implantable device outside of the clinic excluding No cellular tissue based products placed in the center since last visit: Has Dressing in Place as Prescribed: Yes Pain Present Now: No Electronic Signature(s) Signed: 03/30/2019 8:56:47 AM By: Levan Hurst RN, BSN Entered By: Levan Hurst on 03/21/2019 10:33:45 -------------------------------------------------------------------------------- Clinic Level of Care Assessment Details Patient Name: Date of Service: Ricardo [name] 03/21/2019 10:00 AM Medical Record [id number] Patient Account Number: [id number] Date of Birth/Sex: Treating RN: [birth year] (83 y.o. [name]) [name] Primary Care Belen Pesch: Sherrie Mustache Other Clinician: Referring Dom Haverland: Treating Sumit Branham/Extender:Robson, [name], [name] in Treatment: 2 Clinic Level of Care Assessment Items TOOL 4 Quantity Score X - Use when only an EandM is performed on FOLLOW-UP visit 1 0 ASSESSMENTS - Nursing Assessment /  Reassessment X - Reassessment of Co-morbidities (includes updates in patient status) 1 10 X - Reassessment of Adherence to Treatment Plan 1 5 ASSESSMENTS - Wound and Skin Assessment / Reassessment X - Simple Wound Assessment / Reassessment - one wound 1 5 []  - Complex Wound Assessment / Reassessment - multiple wounds 0 []  - Dermatologic / Skin Assessment (not related to wound area) 0 ASSESSMENTS - Focused Assessment []  - Circumferential Edema Measurements - multi extremities 0 []  - Nutritional Assessment / Counseling / Intervention 0 []  - Lower Extremity Assessment (monofilament, tuning fork, pulses) 0 []  - Peripheral Arterial Disease Assessment (using hand held doppler) 0 ASSESSMENTS - Ostomy and/or Continence Assessment and Care []  - Incontinence Assessment and Management 0 []  - Ostomy Care Assessment and Management (repouching, etc.) 0 PROCESS - Coordination of Care X - Simple Patient / Family Education for ongoing care 1 15 []  - Complex (extensive) Patient / Family Education for ongoing care 0 X - Staff obtains Programmer, systems, Records, Test Results / Process Orders 1 10 []  - Staff telephones HHA, Nursing Homes / Clarify orders / etc 0 []  - Routine Transfer to another Facility (non-emergent condition) 0 []  - Routine Hospital Admission (non-emergent condition) 0 []  - New Admissions / Biomedical engineer / Ordering NPWT, Apligraf, etc. 0 []  - Emergency Hospital Admission (emergent condition) 0 X - Simple Discharge Coordination 1 10 []  - Complex (extensive) Discharge Coordination 0 PROCESS - Special Needs []  - Pediatric / Minor Patient Management 0 []  - Isolation Patient Management 0 []  - Hearing / Language / Visual special needs 0 []  - Assessment of Community assistance (transportation, D/C planning, etc.) 0 []  - Additional assistance / Altered mentation 0 []  - Support Surface(s) Assessment (bed, cushion, seat, etc.) 0 INTERVENTIONS - Wound Cleansing / Measurement X - Simple Wound  Cleansing - one wound 1 5 []  - Complex Wound Cleansing - multiple wounds 0 X - Wound Imaging (photographs - any number of wounds) 1 5 []  -  Wound Tracing (instead of photographs) 0 X - Simple Wound Measurement - one wound 1 5 []  - Complex Wound Measurement - multiple wounds 0 INTERVENTIONS - Wound Dressings []  - Small Wound Dressing one or multiple wounds 0 []  - Medium Wound Dressing one or multiple wounds 0 []  - Large Wound Dressing one or multiple wounds 0 []  - Application of Medications - topical 0 []  - Application of Medications - injection 0 INTERVENTIONS - Miscellaneous []  - External ear exam 0 []  - Specimen Collection (cultures, biopsies, blood, body fluids, etc.) 0 []  - Specimen(s) / Culture(s) sent or taken to Lab for analysis 0 []  - Patient Transfer (multiple staff / Civil Service fast streamer / Similar devices) 0 []  - Simple Staple / Suture removal (25 or less) 0 []  - Complex Staple / Suture removal (26 or more) 0 []  - Hypo / Hyperglycemic Management (close monitor of Blood Glucose) 0 []  - Ankle / Brachial Index (ABI) - do not check if billed separately 0 X - Vital Signs 1 5 Has the patient been seen at the hospital within the last three years: Yes Total Score: 75 Level Of Care: New/Established - Level 2 Electronic Signature(s) Signed: 03/22/2019 5:45:19 PM By: [name] RN Entered By: [name] on 03/21/2019 11:34:35 -------------------------------------------------------------------------------- Encounter Discharge Information Details Patient Name: Date of Service: Ricardo [name] 03/21/2019 10:00 AM Medical Record [id number] Patient Account Number: [id number] Date of Birth/Sex: Treating RN: [birth year] (83 y.o. [name] Primary Care Elandra Powell: Sherrie Mustache Other Clinician: Referring Oluwatomisin Hustead: Treating Khadar Monger/Extender:Robson, [name], [name] in Treatment: 2 Encounter Discharge Information Items Discharge Condition: Stable Ambulatory Status:  Wheelchair Discharge Destination: Home Transportation: Private Auto Accompanied By: daughter Schedule Follow-up Appointment: Yes Clinical Summary of Care: Patient Declined Electronic Signature(s) Signed: 03/22/2019 5:45:19 PM By: [name] RN Entered By: [name] on 03/21/2019 11:45:35 -------------------------------------------------------------------------------- Lower Extremity Assessment Details Patient Name: Date of Service: COLEN, RENEE 03/21/2019 10:00 AM Medical Record [id number] Patient Account Number: [id number] Date of Birth/Sex: Treating RN: 1935/05/03 (84 y.o. Janyth Contes Primary Care Xavier Fournier: Sherrie Mustache Other Clinician: Referring Daric Koren: Treating Annaly Skop/Extender:Robson, [name], [name] in Treatment: 2 Edema Assessment Assessed: [Left: No] [Right: No] Edema: [Left: N] [Right: o] Calf Left: Right: Point of Measurement: cm From Medial Instep cm 33.6 cm Ankle Left: Right: Point of Measurement: cm From Medial Instep cm 20.2 cm Vascular Assessment Pulses: Dorsalis Pedis Palpable: [Right:Yes] Electronic Signature(s) Signed: 03/30/2019 8:56:47 AM By: Levan Hurst RN, BSN Entered By: Levan Hurst on 03/21/2019 10:40:51 -------------------------------------------------------------------------------- Multi Wound Chart Details Patient Name: Date of Service: Ricardo [name] 03/21/2019 10:00 AM Medical Record [id number] Patient Account Number: [id number] Date of Birth/Sex: Treating RN: 08-18-1935 (83 y.o. [name]) [name] Primary Care Julanne Schlueter: Sherrie Mustache Other Clinician: Referring Latessa Tillis: Treating Brandley Aldrete/Extender:Robson, [name], [name] in Treatment: 2 Vital Signs Height(in): 63 Pulse(bpm): 52 Weight(lbs): 183 Blood Pressure(mmHg): 119/42 Body Mass Index(BMI): 25 Temperature(F): 97.8 Respiratory 16 Rate(breaths/min): Photos: [3:No Photos] [N/A:N/A] Wound Location: [3:Lateral  Toe Fourth] [N/A:N/A] Wounding Event: [3:Gradually Appeared] [N/A:N/A] Primary Etiology: [3:Atypical] [N/A:N/A] Comorbid History: [3:Cataracts, Middle ear problems, Chronic Obstructive Pulmonary Disease (COPD), Hypertension, Osteoarthritis, Neuropathy, Confinement Anxiety] [N/A:N/A] Date Acquired: [3:02/03/2019] [N/A:N/A] Weeks of Treatment: [3:2] [N/A:N/A] Wound Status: [3:Healed - Epithelialized] [N/A:N/A] Measurements L x W x D 0x0x0 [N/A:N/A] (cm) Area (cm) : [3:0] [N/A:N/A] Volume (cm) : [3:0] [N/A:N/A] % Reduction in Area: [3:100.00%] [N/A:N/A] % Reduction in Volume: 100.00% [N/A:N/A] Classification: [3:Full Thickness Without Exposed Support Structures] [N/A:N/A] Exudate Amount: [3:None Present] [N/A:N/A] Wound  Margin: [3:Distinct, outline attached N/A] Granulation Amount: [3:None Present (0%)] [N/A:N/A] Necrotic Amount: [3:None Present (0%)] [N/A:N/A] Exposed Structures: [3:Fascia: No Fat Layer (Subcutaneous Tissue) Exposed: No Tendon: No Muscle: No Joint: No Bone: No Large (67-100%)] [N/A:N/A N/A] Treatment Notes Electronic Signature(s) Signed: 03/21/2019 5:18:57 PM By: Linton Ham MD Signed: 03/22/2019 5:45:19 PM By: [name] RN Entered By: Linton Ham on 03/21/2019 11:47:00 -------------------------------------------------------------------------------- Multi-Disciplinary Care Plan Details Patient Name: Date of Service: Ricardo [name]. 03/21/2019 10:00 AM Medical Record [id number] Patient Account Number: [id number] Date of Birth/Sex: Treating RN: [birth year] (83 y.o. [name] Primary Care Tyus Kallam: Sherrie Mustache Other Clinician: Referring Lamesha Tibbits: Treating Tyqwan Pink/Extender:Robson, [name], [name] in Treatment: 2 Active Inactive Electronic Signature(s) Signed: 03/22/2019 5:45:19 PM By: [name] RN Entered By: [name] on 03/21/2019  11:31:47 -------------------------------------------------------------------------------- Pain Assessment Details Patient Name: Date of Service: MASIN, MUCHA 03/21/2019 10:00 AM Medical Record [id number] Patient Account Number: [id number] Date of Birth/Sex: Treating RN: 10-08-1935 (84 y.o. Janyth Contes Primary Care Yesena Reaves: Sherrie Mustache Other Clinician: Referring Trexton Escamilla: Treating Cyera Balboni/Extender:Robson, [name], [name] in Treatment: 2 Active Problems Location of Pain Severity and Description of Pain Patient Has Paino No Site Locations Pain Management and Medication Current Pain Management: Electronic Signature(s) Signed: 03/30/2019 8:56:47 AM By: Levan Hurst RN, BSN Entered By: Levan Hurst on 03/21/2019 10:37:46 -------------------------------------------------------------------------------- Patient/Caregiver Education Details Patient Name: [place]aki, Tyray J. 2/16/2021andnbsp10:00 Date of Service: AM Medical Record [id number] Number: Patient Account Number: [id number] Treating RN: Jun 13, 1935 (83 y.o. [name] Date of Birth/Gender: M) Other Clinician: Primary Care Physician: Sherrie Mustache Treating Linton Ham Referring Physician: Physician/Extender: Charlann Lange in Treatment: 2 Education Assessment Education Provided To: Patient Education Topics Provided Wound/Skin Impairment: Methods: Printed Responses: State content correctly Electronic Signature(s) Signed: 03/22/2019 5:45:19 PM By: [name] RN Entered By: [name] on 03/21/2019 11:31:57 -------------------------------------------------------------------------------- Wound Assessment Details Patient Name: Date of Service: DANYAL, KENNON 03/21/2019 10:00 AM Medical Record [id number] Patient Account Number: [id number] Date of Birth/Sex: Treating RN: Aug 05, 1935 (83 y.o. [name]) [name] Primary Care Gauge Winski: Sherrie Mustache Other  Clinician: Referring Kenadi Miltner: Treating Johnnie Moten/Extender:Robson, [name], [name] in Treatment: 2 Wound Status Wound Number: 3 Primary Atypical Etiology: Wound Location: Toe Fourth - Lateral Wound Healed - Epithelialized Wounding Event: Gradually Appeared Status: Date Acquired: 02/03/2019 Comorbid Cataracts, Middle ear problems, Chronic Weeks Of Treatment: 2 History: Obstructive Pulmonary Disease (COPD), Clustered Wound: No Hypertension, Osteoarthritis, Neuropathy, Confinement Anxiety Photos Wound Measurements Length: (cm) 0 % Reduct Width: (cm) 0 % Reduct Depth: (cm) 0 Epitheli Area: (cm) 0 Tunneli Volume: (cm) 0 Undermi Wound Description Classification: Full Thickness Without Exposed Support Foul Odo Structures Slough/F Wound Distinct, outline attached Margin: Exudate None Present Amount: Wound Bed Granulation Amount: None Present (0%) Necrotic Amount: None Present (0%) Fascia E Fat Laye Tendon E Muscle E Joint Ex Bone Exp r After Cleansing: No ibrino No Exposed Structure xposed: No r (Subcutaneous Tissue) Exposed: No xposed: No xposed: No posed: No osed: No ion in Area: 100% ion in Volume: 100% alization: Large (67-100%) ng: No ning: No Electronic Signature(s) Signed: 03/21/2019 4:25:10 PM By: Mikeal Hawthorne EMT/HBOT Signed: 03/22/2019 5:45:19 PM By: [name] RN Entered By: Mikeal Hawthorne on 03/21/2019 14:23:22 -------------------------------------------------------------------------------- Vitals Details Patient Name: Date of Service: Ricardo [name] 03/21/2019 10:00 AM Medical Record [id number] Patient Account Number: [id number] Date of Birth/Sex: Treating RN: [birth year] (84 y.o. Janyth Contes Primary Care Franchot Pollitt: Sherrie Mustache Other Clinician: Referring Derren Suydam: Treating Grafton Warzecha/Extender:Robson, [name], [name] in Treatment: 2  Vital Signs Time Taken: 10:36 Temperature (F): 97.8 Height  (in): 72 Pulse (bpm): 66 Weight (lbs): 183 Respiratory Rate (breaths/min): 16 Body Mass Index (BMI): 24.8 Blood Pressure (mmHg): 119/42 Reference Range: 80 - 120 mg / dl Electronic Signature(s) Signed: 03/30/2019 8:56:47 AM By: Levan Hurst RN, BSN Entered By: Levan Hurst on 03/21/2019 10:37:40

## 2019-04-04 ENCOUNTER — Other Ambulatory Visit: Payer: Self-pay | Admitting: Nurse Practitioner

## 2019-04-04 DIAGNOSIS — G609 Hereditary and idiopathic neuropathy, unspecified: Secondary | ICD-10-CM

## 2019-04-05 NOTE — Telephone Encounter (Signed)
RX last filled 02/06/2019 in epic  Pending appointment on 05/08/2019 to update non opioid treatment agreement.

## 2019-04-18 DIAGNOSIS — R339 Retention of urine, unspecified: Secondary | ICD-10-CM | POA: Diagnosis not present

## 2019-04-18 DIAGNOSIS — Z9359 Other cystostomy status: Secondary | ICD-10-CM | POA: Diagnosis not present

## 2019-05-08 ENCOUNTER — Ambulatory Visit (INDEPENDENT_AMBULATORY_CARE_PROVIDER_SITE_OTHER): Payer: Medicare HMO | Admitting: Nurse Practitioner

## 2019-05-08 ENCOUNTER — Other Ambulatory Visit: Payer: Self-pay

## 2019-05-08 ENCOUNTER — Encounter: Payer: Self-pay | Admitting: Nurse Practitioner

## 2019-05-08 ENCOUNTER — Encounter: Payer: Self-pay | Admitting: Gastroenterology

## 2019-05-08 VITALS — BP 134/70 | HR 70 | Temp 97.7°F

## 2019-05-08 DIAGNOSIS — R69 Illness, unspecified: Secondary | ICD-10-CM | POA: Diagnosis not present

## 2019-05-08 DIAGNOSIS — E782 Mixed hyperlipidemia: Secondary | ICD-10-CM

## 2019-05-08 DIAGNOSIS — I10 Essential (primary) hypertension: Secondary | ICD-10-CM

## 2019-05-08 DIAGNOSIS — G609 Hereditary and idiopathic neuropathy, unspecified: Secondary | ICD-10-CM | POA: Diagnosis not present

## 2019-05-08 DIAGNOSIS — J438 Other emphysema: Secondary | ICD-10-CM | POA: Diagnosis not present

## 2019-05-08 DIAGNOSIS — F39 Unspecified mood [affective] disorder: Secondary | ICD-10-CM | POA: Diagnosis not present

## 2019-05-08 DIAGNOSIS — K219 Gastro-esophageal reflux disease without esophagitis: Secondary | ICD-10-CM

## 2019-05-08 DIAGNOSIS — Z9359 Other cystostomy status: Secondary | ICD-10-CM | POA: Diagnosis not present

## 2019-05-08 DIAGNOSIS — K5900 Constipation, unspecified: Secondary | ICD-10-CM

## 2019-05-08 DIAGNOSIS — R739 Hyperglycemia, unspecified: Secondary | ICD-10-CM

## 2019-05-08 DIAGNOSIS — E871 Hypo-osmolality and hyponatremia: Secondary | ICD-10-CM

## 2019-05-08 DIAGNOSIS — F5101 Primary insomnia: Secondary | ICD-10-CM

## 2019-05-08 NOTE — Progress Notes (Signed)
Careteam: Patient Care Team: Lauree Chandler, NP as PCP - General (Geriatric Medicine) [name], MD (Inactive) as Consulting Physician (Urology)  PLACE OF SERVICE:  [place] Directive information Does Patient Have a Medical Advance Directive?: Yes, Type of Advance Directive: [place], Does patient want to make changes to medical advance directive?: No - Patient declined  Allergies  Allergen Reactions  . Peanut Oil Shortness Of Breath, Itching and Swelling    Swelling of the throat   . Peanut-Containing Drug Products Shortness Of Breath and Swelling    Swelling of the throat     Chief Complaint  Patient presents with  . Medical Management of Chronic Issues    6 month follow-up. Will move to [place] in a couple of months. Here with daughter [name]      HPI: Patient is a 84 y.o. male for routine follow up.  He is moving to [place] in a few month- already has a urologist set up because he visit there in the summer every year  Urinary retention/BPH/suprapubic cath- follows with nurse every 3 weeks and sees urologist every 6 months.   Neuropathy- lyrica still beneficial, using an OTC that has been controlling symptoms.   Hyperglycemia- continues on diet modifications  Anxiety/insomnia- controlled on trazodone and celexa Using melatonin 3 mg and working great  COPD- controlled on breo, doing very well.   GERD- controlled on Protonix  Has increase in exercise, appetite is good.    Has not gotten TDAP vaccine.  Got COVID vaccine   Review of Systems:  Review of Systems  Constitutional: Negative for chills, fever and weight loss.  HENT: Positive for hearing loss. Negative for tinnitus.   Eyes:       Blind  Respiratory: Negative for cough, sputum production and shortness of breath.        Best COPD has been in a long time  Cardiovascular: Negative for chest pain, palpitations and leg swelling.  Gastrointestinal: Positive for  constipation (occasional). Negative for abdominal pain, diarrhea and heartburn.  Genitourinary:       Suprapubic cath  Musculoskeletal: Negative for back pain, falls, joint pain and myalgias.  Skin: Negative.   Neurological: Negative for dizziness, tingling and headaches.  Psychiatric/Behavioral: Negative for depression and memory loss. The patient is not nervous/anxious and does not have insomnia.     Past Medical History:  Diagnosis Date  . Anxiety   . Arthritis    neck   . 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 COPD (chronic obstructive pulmonary disease) ([place])   . 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 Eczema   . Enlarged prostate   . High cholesterol   . Hypertension   . Loss of balance    due to meningitis  . Meningitis   . Neurogenic bladder    History of  . Numbness    in feet due to meningitis   . Rotator cuff tear    right  . Skin cancer   . Stroke [facility])    mini strokes in hospital with meningitis  . Unable to walk    due to meningitis   Past Surgical History:  Procedure Laterality Date  . APPENDECTOMY    . CATARACT EXTRACTION Bilateral 03/05/2014  . COCHLEAR IMPLANT    . CYSTOSCOPY WITH LITHOLAPAXY N/A 05/14/2014   Procedure: CYSTOSCOPY WITH LITHOLAPAXY;  Surgeon: [name], MD;  Location: [facility];  Service: Urology;  Laterality: N/A;  . CYSTOSCOPY WITH LITHOLAPAXY N/A 04/22/2015   Procedure: CYSTOSCOPY WITH LITHOLAPAXY;  Surgeon: [name], MD;  Location: [facility];  Service: Urology;  Laterality: N/A;  . INSERTION OF SUPRAPUBIC CATHETER N/A 05/14/2014   Procedure: INSERTION OF SUPRAPUBIC CATHETER;  Surgeon: [name], MD;  Location: [facility];  Service: Urology;  Laterality: N/A;  . INSERTION OF SUPRAPUBIC CATHETER N/A 04/22/2015   Procedure: INSERTION OF SUPRAPUBIC CATHETER;  Surgeon: [name], MD;  Location: [facility];  Service: Urology;  Laterality: N/A;  . PROSTATE SURGERY     biopsy x 2  . TESTICLE  REMOVAL Left    October 1956  . TONSILLECTOMY     childhood   Social History:   reports that he quit smoking about 54 years ago. His smoking use included cigarettes. He started smoking about 64 years ago. He quit after 10.00 years of use. He has never used smokeless tobacco. He reports current alcohol use. He reports that he does not use drugs.  Family History  Problem Relation Age of Onset  . Stroke Mother   . Heart failure Father   . Kidney Stones Daughter     Medications: Patient's Medications  New Prescriptions   No medications on file  Previous Medications   AMLODIPINE (NORVASC) 5 MG TABLET    TAKE 1 TABLET BY MOUTH EVERY DAY   ASCORBIC ACID (VITAMIN C) 1000 MG TABLET    Take 1,000 mg by mouth daily.   ATORVASTATIN (LIPITOR) 10 MG TABLET    TAKE 1 TABLET (10 MG TOTAL) BY MOUTH AT BEDTIME.   BREO ELLIPTA 100-25 MCG/INH AEPB    INHALE 1 PUFF BY MOUTH EVERY DAY   CALCIUM CARBONATE (CALCIUM 500 PO)    Take 1 tablet by mouth daily.   CALCIUM CARBONATE-VIT D-MIN (CALCIUM 1200 PO)    Take 1 tablet by mouth daily.   CHOLECALCIFEROL (VITAMIN D3) 25 MCG (1000 UT) CAPS    Take 1 capsule by mouth daily.   CITALOPRAM (CELEXA) 20 MG TABLET    Take one tablet by mouth once daily for anxiety   HOMEOPATHIC PRODUCTS (THERAWORX RELIEF EX)    Apply 1 spray topically at bedtime.   LACTOBACILLUS (ACIDOPHILUS) 100 MG CAPS    Take 100 mg by mouth every morning.    LOSARTAN (COZAAR) 25 MG TABLET    Take one tablet by mouth once daily to control blood pressure   MAGNESIUM 500 MG CAPS    Take 1 capsule by mouth daily.   MAGNESIUM HYDROXIDE (MILK OF MAGNESIA) 400 MG/5ML SUSPENSION    Take 15 mLs by mouth daily as needed for mild constipation.   MELATONIN 3 MG TABS TABLET    Take 3 mg by mouth at bedtime.   PANTOPRAZOLE (PROTONIX) 40 MG TABLET    Take one tablet by mouth once daily for stomach   PREGABALIN (LYRICA) 25 MG CAPSULE    TAKE 1 CAPSULE (25 MG TOTAL) BY MOUTH AT BEDTIME.   SODIUM CHLORIDE  IRRIGATION 0.9 % IRRIGATION    Bladder wash   TRAZODONE (DESYREL) 100 MG TABLET    Take one tablet by mouth once daily at bedtime   TRIAMCINOLONE CREAM (KENALOG) 0.1 %    Apply 1 application topically daily.   UNABLE TO FIND    Med Name: [place] irrigate 2 times daily.   ZINC 25 MG TABS    Take 1 tablet by mouth daily.  Modified Medications  No medications on file  Discontinued Medications   MELATONIN 10 MG TABS    Take 10 mg by mouth at bedtime.     Physical Exam:  Vitals:   05/08/19 1100  BP: 134/70  Pulse: 70  Temp: 97.7 F (36.5 C)  TempSrc: Temporal  SpO2: 97%   There is no height or weight on file to calculate BMI. Wt Readings from Last 3 Encounters:  03/09/17 185 lb 12.8 oz (84.3 kg)  09/25/16 186 lb (84.4 kg)  03/20/16 192 lb 12.8 oz (87.5 kg)    Physical Exam Constitutional:      General: He is not in acute distress.    Appearance: He is well-developed. He is not diaphoretic.  HENT:     Head: Normocephalic and atraumatic.  Eyes:     Conjunctiva/sclera: Conjunctivae normal.  Cardiovascular:     Rate and Rhythm: Normal rate and regular rhythm.     Heart sounds: Normal heart sounds.  Pulmonary:     Effort: Pulmonary effort is normal.     Breath sounds: Normal breath sounds.  Abdominal:     General: Bowel sounds are normal.     Palpations: Abdomen is soft.  Musculoskeletal:        General: No tenderness.     Cervical back: Normal range of motion and neck supple.  Skin:    General: Skin is warm and dry.  Neurological:     Mental Status: He is alert and oriented to person, place, and time.  Psychiatric:        Mood and Affect: Mood normal.        Behavior: Behavior normal.     Labs reviewed: Basic Metabolic Panel: Recent Labs    09/28/18 1342 11/07/18 1157  NA 133* 133*  K 4.8 4.6  CL 99 99  CO2 26 27  GLUCOSE 108 108  BUN 18 20  CREATININE 1.03 1.04  CALCIUM 9.1 9.2   Liver Function Tests: Recent Labs    09/28/18 1342  AST 15  ALT  11  BILITOT 0.4  PROT 6.6   No results for input(s): LIPASE, AMYLASE in the last 8760 hours. No results for input(s): AMMONIA in the last 8760 hours. CBC: Recent Labs    09/28/18 1342 11/07/18 1157  WBC 9.7 9.1  NEUTROABS 6,664 6,598  HGB 12.7* 13.2  HCT 38.6 39.9  MCV 92.1 91.5  PLT 264 230   Lipid Panel: Recent Labs    11/07/18 1157  CHOL 150  HDL 69  LDLCALC 65  TRIG 82  CHOLHDL 2.2   TSH: No results for input(s): TSH in the last 8760 hours. A1C: Lab Results  Component Value Date   HGBA1C 5.8 (H) 09/28/2018     Assessment/Plan 1. Other emphysema ([place]) Controlled on Breo.  2. Mood disorder ([place]) Mood has been stable and well controlled on celexa and trazodone  3. Hyperglycemia -continues on diet modifications with physical activity - Hemoglobin A1c  4. Mixed hyperlipidemia -LDL at goal on lipitor with dietary modifications. - COMPLETE METABOLIC PANEL WITH GFR  5. Essential hypertension Stable on norvasc and losartan,  - CBC with Differential/Platelet - COMPLETE METABOLIC PANEL WITH GFR  6. Idiopathic peripheral neuropathy Doing very well at this time on lyrica and OTC supplement  7. Suprapubic catheter ([place]) -stable following with urology every 3 weeks for catheter changes and every 6 months with urologist.   8. Gastroesophageal reflux disease without esophagitis -stable on protonix with dietary modifications.   9. Primary  insomnia -stable, doing well on trazodone 100 mg qhs and melatonin 3 mg  10. Hyponatremia -has been stable on recent labs, will follow up lab.  11. Constipation, unspecified constipation type Controlled. Using OTC if needed.   Next appt: 4 weeks for AWV and MOST form completion. Carlos American. [place], [facility] [phone]

## 2019-05-08 NOTE — Patient Instructions (Signed)
-   ADD APPT for most form completion after AWV- via mychart visit

## 2019-05-09 DIAGNOSIS — Z466 Encounter for fitting and adjustment of urinary device: Secondary | ICD-10-CM | POA: Diagnosis not present

## 2019-05-09 LAB — CBC WITH DIFFERENTIAL/PLATELET
Absolute Monocytes: 1137 cells/uL — ABNORMAL HIGH (ref 200–950)
Basophils Absolute: 38 cells/uL (ref 0–200)
Basophils Relative: 0.4 %
Eosinophils Absolute: 38 cells/uL (ref 15–500)
Eosinophils Relative: 0.4 %
HCT: 39.3 % (ref 38.5–50.0)
Hemoglobin: 13 g/dL — ABNORMAL LOW (ref 13.2–17.1)
Lymphs Abs: 1683 cells/uL (ref 850–3900)
MCH: 31 pg (ref 27.0–33.0)
MCHC: 33.1 g/dL (ref 32.0–36.0)
MCV: 93.6 fL (ref 80.0–100.0)
MPV: 10.6 fL (ref 7.5–12.5)
Monocytes Relative: 12.1 %
Neutro Abs: 6505 cells/uL (ref 1500–7800)
Neutrophils Relative %: 69.2 %
Platelets: 220 10*3/uL (ref 140–400)
RBC: 4.2 10*6/uL (ref 4.20–5.80)
RDW: 12.2 % (ref 11.0–15.0)
Total Lymphocyte: 17.9 %
WBC: 9.4 10*3/uL (ref 3.8–10.8)

## 2019-05-09 LAB — COMPLETE METABOLIC PANEL WITH GFR
AG Ratio: 1.8 (calc) (ref 1.0–2.5)
ALT: 12 U/L (ref 9–46)
AST: 16 U/L (ref 10–35)
Albumin: 4.2 g/dL (ref 3.6–5.1)
Alkaline phosphatase (APISO): 55 U/L (ref 35–144)
BUN/Creatinine Ratio: 20 (calc) (ref 6–22)
BUN: 23 mg/dL (ref 7–25)
CO2: 24 mmol/L (ref 20–32)
Calcium: 9.1 mg/dL (ref 8.6–10.3)
Chloride: 101 mmol/L (ref 98–110)
Creat: 1.13 mg/dL — ABNORMAL HIGH (ref 0.70–1.11)
GFR, Est African American: 70 mL/min/{1.73_m2} (ref >=60)
GFR, Est Non African American: 61 mL/min/{1.73_m2} (ref >=60)
Globulin: 2.3 g/dL (calc) (ref 1.9–3.7)
Glucose, Bld: 92 mg/dL (ref 65–99)
Potassium: 4.6 mmol/L (ref 3.5–5.3)
Sodium: 135 mmol/L (ref 135–146)
Total Bilirubin: 0.4 mg/dL (ref 0.2–1.2)
Total Protein: 6.5 g/dL (ref 6.1–8.1)

## 2019-05-09 LAB — HEMOGLOBIN A1C
Hgb A1c MFr Bld: 5.6 % of total Hgb (ref <5.7)
Mean Plasma Glucose: 114 (calc)
eAG (mmol/L): 6.3 (calc)

## 2019-05-17 ENCOUNTER — Other Ambulatory Visit: Payer: Self-pay | Admitting: Nurse Practitioner

## 2019-05-17 DIAGNOSIS — I10 Essential (primary) hypertension: Secondary | ICD-10-CM

## 2019-05-17 DIAGNOSIS — F411 Generalized anxiety disorder: Secondary | ICD-10-CM

## 2019-05-17 DIAGNOSIS — E782 Mixed hyperlipidemia: Secondary | ICD-10-CM

## 2019-05-17 NOTE — Telephone Encounter (Signed)
Patient requesting refill on medications. Patient last office visit was 05/08/2019. Patient updated non opioid treatment agreement 05/08/2019. Patient has upcoming appointment 06/05/2019. Medications pend and sent to provider. Please Advise.

## 2019-05-30 ENCOUNTER — Other Ambulatory Visit: Payer: Self-pay | Admitting: Nurse Practitioner

## 2019-05-30 DIAGNOSIS — G4700 Insomnia, unspecified: Secondary | ICD-10-CM

## 2019-05-30 DIAGNOSIS — Z466 Encounter for fitting and adjustment of urinary device: Secondary | ICD-10-CM | POA: Diagnosis not present

## 2019-05-30 NOTE — Telephone Encounter (Signed)
rx sent to pharmacy by e-script  

## 2019-06-04 ENCOUNTER — Other Ambulatory Visit: Payer: Self-pay | Admitting: Nurse Practitioner

## 2019-06-04 DIAGNOSIS — J438 Other emphysema: Secondary | ICD-10-CM

## 2019-06-04 DIAGNOSIS — I10 Essential (primary) hypertension: Secondary | ICD-10-CM

## 2019-06-05 ENCOUNTER — Telehealth (INDEPENDENT_AMBULATORY_CARE_PROVIDER_SITE_OTHER): Payer: Medicare HMO | Admitting: Nurse Practitioner

## 2019-06-05 ENCOUNTER — Other Ambulatory Visit: Payer: Self-pay

## 2019-06-05 ENCOUNTER — Telehealth: Payer: Self-pay

## 2019-06-05 ENCOUNTER — Ambulatory Visit (INDEPENDENT_AMBULATORY_CARE_PROVIDER_SITE_OTHER): Payer: Medicare HMO | Admitting: Nurse Practitioner

## 2019-06-05 ENCOUNTER — Encounter: Payer: Self-pay | Admitting: Nurse Practitioner

## 2019-06-05 DIAGNOSIS — Z0000 Encounter for general adult medical examination without abnormal findings: Secondary | ICD-10-CM | POA: Diagnosis not present

## 2019-06-05 DIAGNOSIS — Z7189 Other specified counseling: Secondary | ICD-10-CM | POA: Diagnosis not present

## 2019-06-05 NOTE — Telephone Encounter (Signed)
Mr. luvern, finkbiner are scheduled for a virtual visit with your provider today.    Just as we do with appointments in the office, we must obtain your consent to participate.  Your consent will be active for this visit and any virtual visit you may have with one of our providers in the next 365 days.    If you have a MyChart account, I can also send a copy of this consent to you electronically.  All virtual visits are billed to your insurance company just like a traditional visit in the office.  As this is a virtual visit, video technology does not allow for your provider to perform a traditional examination.  This may limit your provider's ability to fully assess your condition.  If your provider identifies any concerns that need to be evaluated in person or the need to arrange testing such as labs, EKG, etc, we will make arrangements to do so.    Although advances in technology are sophisticated, we cannot ensure that it will always work on either your end or our end.  If the connection with a video visit is poor, we may have to switch to a telephone visit.  With either a video or telephone visit, we are not always able to ensure that we have a secure connection.   I need to obtain your verbal consent now.   Are you willing to proceed with your visit today?   KERRY BARKLOW has provided verbal consent on 06/05/2019 for a virtual visit (video or telephone).   Leigh Aurora [place], [place] 06/05/2019  2:01 PM

## 2019-06-05 NOTE — Progress Notes (Signed)
This service is provided via telemedicine  No vital signs collected/recorded due to the encounter was a telemedicine visit.   Location of patient (ex: home, work):  Home  Patient consents to a telephone visit:  Yes, see telephone encounter dated 06/05/2019 with annual consent   Location of the provider (ex: office, home):  [facility], Office   Name of any referring provider:  n/a  Names of all persons participating in the telemedicine service and their role in the encounter:  S.Chrae B/CMA, Sherrie Mustache, NP, [name] (daughter), and Patient   Time spent on call:  9 min with medical assistant      Careteam: Patient Care Team: Lauree Chandler, NP as PCP - General (Geriatric Medicine) [name], MD (Inactive) as Consulting Physician (Urology)  PLACE OF SERVICE:  [facility]  Advanced Directive information    Allergies  Allergen Reactions  . Peanut Oil Shortness Of Breath, Itching and Swelling    Swelling of the throat   . Peanut-Containing Drug Products Shortness Of Breath and Swelling    Swelling of the throat     Chief Complaint  Patient presents with  . Advanced Directive    Discuss MOST form     HPI: Patient is a 84 y.o. male via telephone visit to discuss and complete MOST form. Pt with a hx of urinary retention with suprapubic cath, neuropathy, hyperglycemia, anxiety/insomnia, COPD, GERD. He is legally blind and HOH. His daughter helps care for him and on the phone to help with visit today. He already has DNR at home and has yellow paper.  Review of Systems:  Review of Systems  Constitutional: Negative for chills, fever and malaise/fatigue.    Past Medical History:  Diagnosis Date  . Anxiety   . Arthritis    neck   . 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 Cancer [facility]) 1956   left testicle  . COPD (chronic obstructive pulmonary disease) ([place])   . 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 Eczema   . Enlarged prostate   . High cholesterol   .  Hypertension   . Loss of balance    due to meningitis  . Meningitis   . Neurogenic bladder    History of  . Numbness    in feet due to meningitis   . Rotator cuff tear    right  . Skin cancer   . Stroke [facility])    mini strokes in hospital with meningitis  . Unable to walk    due to meningitis   Past Surgical History:  Procedure Laterality Date  . APPENDECTOMY    . CATARACT EXTRACTION Bilateral 03/05/2014  . COCHLEAR IMPLANT    . CYSTOSCOPY WITH LITHOLAPAXY N/A 05/14/2014   Procedure: CYSTOSCOPY WITH LITHOLAPAXY;  Surgeon: [name], MD;  Location: [facility];  Service: Urology;  Laterality: N/A;  . CYSTOSCOPY WITH LITHOLAPAXY N/A 04/22/2015   Procedure: CYSTOSCOPY WITH LITHOLAPAXY;  Surgeon: [name], MD;  [facility];  Service: Urology;  Laterality: N/A;  . INSERTION OF SUPRAPUBIC CATHETER N/A 05/14/2014   Procedure: INSERTION OF SUPRAPUBIC CATHETER;  Surgeon: [name], MD;  [facility];  Service: Urology;  Laterality: N/A;  . INSERTION OF SUPRAPUBIC CATHETER N/A 04/22/2015   Procedure: INSERTION OF SUPRAPUBIC CATHETER;  Surgeon: [name], MD;  [facility];  Service: Urology;  Laterality: N/A;  . PROSTATE SURGERY     biopsy x 2  . TESTICLE REMOVAL Left  October 1956  . TONSILLECTOMY     childhood   Social History:   reports that he quit smoking about 54 years ago. His smoking use included cigarettes. He started smoking about 64 years ago. He quit after 10.00 years of use. He has never used smokeless tobacco. He reports current alcohol use. He reports that he does not use drugs.  Family History  Problem Relation Age of Onset  . Stroke Mother   . Heart failure Father   . Kidney Stones Daughter     Medications: Patient's Medications  New Prescriptions   No medications on file  Previous Medications   AMLODIPINE (NORVASC) 5 MG TABLET    TAKE 1 TABLET BY MOUTH EVERY DAY   ASCORBIC ACID (VITAMIN C) 1000 MG TABLET    Take  1,000 mg by mouth daily.   ATORVASTATIN (LIPITOR) 10 MG TABLET    TAKE 1 TABLET BY MOUTH EVERYDAY AT BEDTIME   BREO ELLIPTA 100-25 MCG/INH AEPB    INHALE 1 PUFF BY MOUTH EVERY DAY   CALCIUM CARBONATE (CALCIUM 500 PO)    Take 1 tablet by mouth daily.   CALCIUM CARBONATE-VIT D-MIN (CALCIUM 1200 PO)    Take 1 tablet by mouth daily.   CHOLECALCIFEROL (VITAMIN D3) 25 MCG (1000 UT) CAPS    Take 1 capsule by mouth daily.   CITALOPRAM (CELEXA) 20 MG TABLET    TAKE ONE TABLET BY MOUTH ONCE DAILY FOR ANXIETY   HOMEOPATHIC PRODUCTS (THERAWORX RELIEF EX)    Apply 1 spray topically at bedtime.   LACTOBACILLUS (ACIDOPHILUS) 100 MG CAPS    Take 100 mg by mouth every morning.    LOSARTAN (COZAAR) 25 MG TABLET    TAKE ONE TABLET BY MOUTH ONCE DAILY TO CONTROL BLOOD PRESSURE   MAGNESIUM 500 MG CAPS    Take 1 capsule by mouth daily.   MAGNESIUM HYDROXIDE (MILK OF MAGNESIA) 400 MG/5ML SUSPENSION    Take 15 mLs by mouth daily as needed for mild constipation.   MELATONIN 3 MG TABS TABLET    Take 3 mg by mouth at bedtime.   PANTOPRAZOLE (PROTONIX) 40 MG TABLET    TAKE ONE TABLET BY MOUTH ONCE DAILY FOR STOMACH   PREGABALIN (LYRICA) 25 MG CAPSULE    TAKE 1 CAPSULE (25 MG TOTAL) BY MOUTH AT BEDTIME.   TRAZODONE (DESYREL) 100 MG TABLET    TAKE 1 TABLET BY MOUTH EVERYDAY AT BEDTIME   TRIAMCINOLONE CREAM (KENALOG) 0.1 %    Apply 1 application topically daily.   UNABLE TO FIND    Med Name: Renacidan irrigate 2 times every other day   ZINC 25 MG TABS    Take 1 tablet by mouth daily.  Modified Medications   No medications on file  Discontinued Medications   No medications on file    Physical Exam:  There were no vitals filed for this visit. There is no height or weight on file to calculate BMI. Wt Readings from Last 3 Encounters:  03/09/17 185 lb 12.8 oz (84.3 kg)  09/25/16 186 lb (84.4 kg)  03/20/16 192 lb 12.8 oz (87.5 kg)     Labs reviewed: Basic Metabolic Panel: Recent Labs    09/28/18 1342  11/07/18 1157 05/08/19 1127  NA 133* 133* 135  K 4.8 4.6 4.6  CL 99 99 101  CO2 26 27 24   GLUCOSE 108 108 92  BUN 18 20 23   CREATININE 1.03 1.04 1.13*  CALCIUM 9.1 9.2 9.1   Liver Function  Tests: Recent Labs    09/28/18 1342 05/08/19 1127  AST 15 16  ALT 11 12  BILITOT 0.4 0.4  PROT 6.6 6.5   No results for input(s): LIPASE, AMYLASE in the last 8760 hours. No results for input(s): AMMONIA in the last 8760 hours. CBC: Recent Labs    09/28/18 1342 11/07/18 1157 05/08/19 1127  WBC 9.7 9.1 9.4  NEUTROABS 6,664 6,598 6,505  HGB 12.7* 13.2 13.0*  HCT 38.6 39.9 39.3  MCV 92.1 91.5 93.6  PLT 264 230 220   Lipid Panel: Recent Labs    11/07/18 1157  CHOL 150  HDL 69  LDLCALC 65  TRIG 82  CHOLHDL 2.2   TSH: No results for input(s): TSH in the last 8760 hours. A1C: Lab Results  Component Value Date   HGBA1C 5.6 05/08/2019     Assessment/Plan 1. Advance care planning -MOST from reviewed and completed with pt. Copy of MOST form will be mailed to pt. - DNR (Do Not Resuscitate)  Carlos American. Toney Lizaola, [facility] [phone]    Virtual Visit via Telephone Note  I connected with pt and daughter on 06/05/19 at  3:45 PM EDT by telephone and verified that I am speaking with the correct person using two identifiers.  Location: Patient: home Provider: office   I discussed the limitations, risks, security and privacy concerns of performing an evaluation and management service by telephone and the availability of in person appointments. I also discussed with the patient that there may be a patient responsible charge related to this service. The patient expressed understanding and agreed to proceed.   I discussed the assessment and treatment plan with the patient. The patient was provided an opportunity to ask questions and all were answered. The patient agreed with the plan and demonstrated an understanding of the instructions.    The patient was advised to call back or seek an in-person evaluation if the symptoms worsen or if the condition fails to improve as anticipated.  I provided 12 minutes of non-face-to-face time during this encounter.  Carlos American. Harle Battiest Avs printed and mailed

## 2019-06-05 NOTE — Progress Notes (Signed)
This service is provided via telemedicine  No vital signs collected/recorded due to the encounter was a telemedicine visit.   Location of patient (ex: home, work):  Home   Patient consents to a telephone visit:  Yes, see telephone encounter dated 06/05/2019 with annual consent  Location of the provider (ex: office, home):  [facility], Office   Name of any referring provider:  n/a  Names of all persons participating in the telemedicine service and their role in the encounter:  S.Chrae B/CMA, Sherrie Mustache, NP, [name] (daughter), and Patient   Time spent on call:  9 min with medical assistant

## 2019-06-05 NOTE — Progress Notes (Signed)
Subjective:   Ricardo SCHOOF is a 84 y.o. male who presents for Medicare Annual/Subsequent preventive examination.  Review of Systems:   Cardiac Risk Factors include: advanced age (>72men, >51 women);hypertension;male gender     Objective:    Vitals: There were no vitals taken for this visit.  There is no height or weight on file to calculate BMI.  Advanced Directives 06/05/2019 05/08/2019 11/07/2018 09/28/2018 06/02/2018 05/25/2017 03/09/2017  Does Patient Have a Medical Advance Directive? Yes Yes Yes Yes Yes Yes Yes  Type of Paramedic of [place];Living will Healthcare Power of [place];Living will [place];Living will [place];Living will [place]  Does patient want to make changes to medical advance directive? No - Patient declined No - Patient declined No - Patient declined No - Patient declined No - Patient declined No - Patient declined No - Patient declined  Copy of [place] in Chart? Yes - validated most recent copy scanned in chart (See row information) Yes - validated most recent copy scanned in chart (See row information) Yes - validated most recent copy scanned in chart (See row information) Yes - validated most recent copy scanned in chart (See row information) Yes - validated most recent copy scanned in chart (See row information) Yes -  Would patient like information on creating a medical advance directive? - - - - - - -    Tobacco Social History   Tobacco Use  Smoking Status Former Smoker  . Years: 10.00  . Types: Cigarettes  . Start date: 02/10/1955  . Quit date: 02/09/1965  . Years since quitting: 54.3  Smokeless Tobacco Never Used     Counseling given: Not Answered   Clinical Intake:  Pre-visit preparation completed: Yes  Pain : No/denies pain     Nutritional Risks: None  How often do you need to have someone help you when you  read instructions, pamphlets, or other written materials from your doctor or pharmacy?: 5 - Always(blind)        Past Medical History:  Diagnosis Date  . Anxiety   . Arthritis    neck   . 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 Cancer [facility]) 1956   left testicle  . COPD (chronic obstructive pulmonary disease) ([place])   . 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 Eczema   . Enlarged prostate   . High cholesterol   . Hypertension   . Loss of balance    due to meningitis  . Meningitis   . Neurogenic bladder    History of  . Numbness    in feet due to meningitis   . Rotator cuff tear    right  . Skin cancer   . Stroke [facility])    mini strokes in hospital with meningitis  . Unable to walk    due to meningitis   Past Surgical History:  Procedure Laterality Date  . APPENDECTOMY    . CATARACT EXTRACTION Bilateral 03/05/2014  . COCHLEAR IMPLANT    . CYSTOSCOPY WITH LITHOLAPAXY N/A 05/14/2014   Procedure: CYSTOSCOPY WITH LITHOLAPAXY;  Surgeon: [name], MD;  [facility];  Service: Urology;  Laterality: N/A;  . CYSTOSCOPY WITH LITHOLAPAXY N/A 04/22/2015   Procedure: CYSTOSCOPY WITH LITHOLAPAXY;  Surgeon: [name], MD;  [facility];  Service: Urology;  Laterality: N/A;  . INSERTION OF SUPRAPUBIC CATHETER N/A 05/14/2014   Procedure:  INSERTION OF SUPRAPUBIC CATHETER;  Surgeon: [name], MD;  [facility];  Service: Urology;  Laterality: N/A;  . INSERTION OF SUPRAPUBIC CATHETER N/A 04/22/2015   Procedure: INSERTION OF SUPRAPUBIC CATHETER;  Surgeon: [name], MD;  [facility];  Service: Urology;  Laterality: N/A;  . PROSTATE SURGERY     biopsy x 2  . TESTICLE REMOVAL Left    October 1956  . TONSILLECTOMY     childhood   Family History  Problem Relation Age of Onset  . Stroke Mother   . Heart failure Father   . Kidney Stones Daughter    Social History   Socioeconomic History  . Marital status: Widowed    Spouse name: Not on file  . Number  of children: Not on file  . Years of education: Not on file  . Highest education level: Not on file  Occupational History  . Not on file  Tobacco Use  . Smoking status: Former Smoker    Years: 10.00    Types: Cigarettes    Start date: 02/10/1955    Quit date: 02/09/1965    Years since quitting: 54.3  . Smokeless tobacco: Never Used  Substance and Sexual Activity  . Alcohol use: Yes    Alcohol/week: 0.0 standard drinks    Comment: Only on holiday  . Drug use: No  . Sexual activity: Never  Other Topics Concern  . Not on file  Social History Narrative   Diet- Fairly balanced   Caffeine- Yes, Coffee   Married- 1964, [place] living now with daughter, all on the first floor   Pets-4, dog and Radiation protection practitioner, English as a second language teacher for [employer], Chief Financial Officer, Freight forwarder   Exercise- Yes, Rehab 3 times a week   Living will- Yes   DNR- Yes   POA/HPOA- Yes      Social Determinants of Health   Financial Resource Strain:   . Difficulty of Paying Living Expenses:   Food Insecurity:   . Worried About Charity fundraiser in the Last Year:   . Arboriculturist in the Last Year:   Transportation Needs:   . Film/video editor (Medical):   Marland Kitchen Lack of Transportation (Non-Medical):   Physical Activity:   . Days of Exercise per Week:   . Minutes of Exercise per Session:   Stress:   . Feeling of Stress :   Social Connections:   . Frequency of Communication with Friends and Family:   . Frequency of Social Gatherings with Friends and Family:   . Attends Religious Services:   . Active Member of Clubs or Organizations:   . Attends Archivist Meetings:   Marland Kitchen Marital Status:     Outpatient Encounter Medications as of 06/05/2019  Medication Sig  . amLODipine (NORVASC) 5 MG tablet TAKE 1 TABLET BY MOUTH EVERY DAY  . Ascorbic Acid (VITAMIN C) 1000 MG tablet Take 1,000 mg by mouth daily.  Marland Kitchen atorvastatin (LIPITOR) 10 MG tablet TAKE 1 TABLET BY MOUTH EVERYDAY AT BEDTIME    . BREO ELLIPTA 100-25 MCG/INH AEPB INHALE 1 PUFF BY MOUTH EVERY DAY  . Calcium Carbonate (CALCIUM 500 PO) Take 1 tablet by mouth daily.  . Calcium Carbonate-Vit D-Min (CALCIUM 1200 PO) Take 1 tablet by mouth daily.  . Cholecalciferol (VITAMIN D3) 25 MCG (1000 UT) CAPS Take 1 capsule by mouth daily.  . citalopram (CELEXA) 20 MG tablet TAKE ONE TABLET BY MOUTH ONCE DAILY FOR ANXIETY  .  Homeopathic Products (THERAWORX RELIEF EX) Apply 1 spray topically at bedtime.  . Lactobacillus (ACIDOPHILUS) 100 MG CAPS Take 100 mg by mouth every morning.   Marland Kitchen losartan (COZAAR) 25 MG tablet TAKE ONE TABLET BY MOUTH ONCE DAILY TO CONTROL BLOOD PRESSURE  . Magnesium 500 MG CAPS Take 1 capsule by mouth daily.  . magnesium hydroxide (MILK OF MAGNESIA) 400 MG/5ML suspension Take 15 mLs by mouth daily as needed for mild constipation.  . melatonin 3 MG TABS tablet Take 3 mg by mouth at bedtime.  . pantoprazole (PROTONIX) 40 MG tablet TAKE ONE TABLET BY MOUTH ONCE DAILY FOR STOMACH  . pregabalin (LYRICA) 25 MG capsule TAKE 1 CAPSULE (25 MG TOTAL) BY MOUTH AT BEDTIME.  . traZODone (DESYREL) 100 MG tablet TAKE 1 TABLET BY MOUTH EVERYDAY AT BEDTIME  . triamcinolone cream (KENALOG) 0.1 % Apply 1 application topically daily.  Marland Kitchen UNABLE TO FIND Med Name: Renacidan irrigate 2 times every other day  . Zinc 25 MG TABS Take 1 tablet by mouth daily.  . [DISCONTINUED] sodium chloride irrigation 0.9 % irrigation Bladder wash   No facility-administered encounter medications on file as of 06/05/2019.    Activities of Daily Living In your present state of health, do you have any difficulty performing the following activities: 06/05/2019  Hearing? Y  Vision? Y  Difficulty concentrating or making decisions? N  Walking or climbing stairs? Y  Dressing or bathing? Y  Doing errands, shopping? Y  Preparing Food and eating ? Y  Using the Toilet? N  In the past six months, have you accidently leaked urine? Y  Comment from suprapubic  cathater  Do you have problems with loss of bowel control? N  Managing your Medications? Y  Managing your Finances? Y  Housekeeping or managing your Housekeeping? Y  Some recent data might be hidden    Patient Care Team: Lauree Chandler, NP as PCP - General (Geriatric Medicine) [name], MD (Inactive) as Consulting Physician (Urology)   Assessment:   This is a routine wellness examination for [facility].  Exercise Activities and Dietary recommendations Current Exercise Habits: Home exercise routine, Type of exercise: calisthenics;walking;strength training/weights, Time (Minutes): > 60(4 hours), Frequency (Times/Week): 7, Weekly Exercise (Minutes/Week): 0  Goals    . <enter goal here>     Starting 03/20/16, I will maintain my current lifestyle.     . Patient Stated     Would like to continue exercise        Fall Risk Fall Risk  06/05/2019 05/08/2019 11/07/2018 09/28/2018 06/02/2018  Falls in the past year? 1 1 1 1  0  Number falls in past yr: 0 0 0 0 0  Comment - - - - -  Injury with Fall? 0 0 0 0 0  Risk Factor Category  - - - - -  Risk for fall due to : - - - - -  Follow up - - - - -   Is the patient's home free of loose throw rugs in walkways, pet beds, electrical cords, etc?   yes      Grab bars in the bathroom? yes      Handrails on the stairs?   yes      Adequate lighting?   yes  Timed Get Up and Go Performed: na  Depression Screen PHQ 2/9 Scores 06/05/2019 05/08/2019 11/26/2017 05/25/2017  PHQ - 2 Score 0 0 0 0    Cognitive Function MMSE - Mini Mental State Exam 05/25/2017 03/20/2016 10/11/2015 10/10/2014  Not completed: Unable to complete Unable to complete Unable to complete -  Orientation to time 5 - - 5  Orientation to Place 4 - - 5  Registration 3 - - 3  Attention/ Calculation 5 - - 5  Recall 2 - - 3  Language- name 2 objects 0 - - 2  Language- repeat 1 - - 1  Language- follow 3 step command 0 - - 3  Language- read & follow direction 0 - - 0  Write a  sentence 0 - - 0  Copy design 0 - - 0  Total score 20 - - 27     6CIT Screen 06/05/2019 06/02/2018  What Year? 0 points 0 points  What month? 0 points 0 points  What time? 0 points 0 points  Count back from 20 2 points 0 points  Months in reverse 0 points 4 points  Repeat phrase 0 points 2 points  Total Score 2 6    Immunization History  Administered Date(s) Administered  . Fluad Quad(high Dose 65+) 09/28/2018  . Influenza, High Dose Seasonal PF 09/25/2016  . Influenza,inj,Quad PF,6+ Mos 10/10/2014, 10/11/2015, 11/26/2017  . Influenza-Unspecified 02/03/2012  . PFIZER SARS-COV-2 Vaccination 03/09/2019, 03/30/2019  . Pneumococcal Conjugate-13 11/07/2018  . Pneumococcal Polysaccharide-23 08/14/2013  . Pneumococcal-Unspecified 02/02/2013  . Zoster 02/03/2011    Qualifies for Shingles Vaccine? Recommended to get Shingrix   Screening Tests Health Maintenance  Topic Date Due  . TETANUS/TDAP  Never done  . INFLUENZA VACCINE  09/03/2019  . COVID-19 Vaccine  Completed  . PNA vac Low Risk Adult  Completed   Cancer Screenings: Lung: Low Dose CT Chest recommended if Age 50-80 years, 30 pack-year currently smoking OR have quit w/in 15years. Patient does not qualify. Colorectal: aged out  Additional Screenings: na Hepatitis C Screening:      Plan:     I have personally reviewed and noted the following in the patient's chart:   . Medical and social history . Use of alcohol, tobacco or illicit drugs  . Current medications and supplements . Functional ability and status . Nutritional status . Physical activity . Advanced directives . List of other physicians . Hospitalizations, surgeries, and ER visits in previous 12 months . Vitals . Screenings to include cognitive, depression, and falls . Referrals and appointments  In addition, I have reviewed and discussed with patient certain preventive protocols, quality metrics, and best practice recommendations. A written  personalized care plan for preventive services as well as general preventive health recommendations were provided to patient.     Lauree Chandler, NP  06/05/2019

## 2019-06-05 NOTE — Patient Instructions (Addendum)
[name] , Thank you for taking time to come for your Medicare Wellness Visit. I appreciate your ongoing commitment to your health goals. Please review the following plan we discussed and let me know if I can assist you in the future.   Screening recommendations/referrals: Colonoscopy aged out Recommended yearly ophthalmology/optometry visit for glaucoma screening and checkup Recommended yearly dental visit for hygiene and checkup  Vaccinations: Influenza vaccine recommended in September 2021 Pneumococcal vaccine up to date Tdap vaccine RECOMMENDED to get at local pharmacy Shingles vaccine   RECOMMENDED to get [facility] at local pharmacy  Advanced directives: on file.   Conditions/risks identified: none.  Next appointment: 1 year  Preventive Care 82 Years and Older, Male Preventive care refers to lifestyle choices and visits with your health care provider that can promote health and wellness. What does preventive care include?  A yearly physical exam. This is also called an annual well check.  Dental exams once or twice a year.  Routine eye exams. Ask your health care provider how often you should have your eyes checked.  Personal lifestyle choices, including:  Daily care of your teeth and gums.  Regular physical activity.  Eating a healthy diet.  Avoiding tobacco and drug use.  Limiting alcohol use.  Practicing safe sex.  Taking low doses of aspirin every day.  Taking vitamin and mineral supplements as recommended by your health care provider. What happens during an annual well check? The services and screenings done by your health care provider during your annual well check will depend on your age, overall health, lifestyle risk factors, and family history of disease. Counseling  Your health care provider may ask you questions about your:  Alcohol use.  Tobacco use.  Drug use.  Emotional well-being.  Home and relationship well-being.  Sexual  activity.  Eating habits.  History of falls.  Memory and ability to understand (cognition).  Work and work Statistician. Screening  You may have the following tests or measurements:  Height, weight, and BMI.  Blood pressure.  Lipid and cholesterol levels. These may be checked every 5 years, or more frequently if you are over 25 years old.  Skin check.  Lung cancer screening. You may have this screening every year starting at age 51 if you have a 30-pack-year history of smoking and currently smoke or have quit within the past 15 years.  Fecal occult blood test (FOBT) of the stool. You may have this test every year starting at age 57.  Flexible sigmoidoscopy or colonoscopy. You may have a sigmoidoscopy every 5 years or a colonoscopy every 10 years starting at age 48.  Prostate cancer screening. Recommendations will vary depending on your family history and other risks.  Hepatitis C blood test.  Hepatitis B blood test.  Sexually transmitted disease (STD) testing.  Diabetes screening. This is done by checking your blood sugar (glucose) after you have not eaten for a while (fasting). You may have this done every 1-3 years.  Abdominal aortic aneurysm (AAA) screening. You may need this if you are a current or former smoker.  Osteoporosis. You may be screened starting at age 28 if you are at high risk. Talk with your health care provider about your test results, treatment options, and if necessary, the need for more tests. Vaccines  Your health care provider may recommend certain vaccines, such as:  Influenza vaccine. This is recommended every year.  Tetanus, diphtheria, and acellular pertussis (Tdap, Td) vaccine. You may need a Td booster every  10 years.  Zoster vaccine. You may need this after age 76.  Pneumococcal 13-valent conjugate (PCV13) vaccine. One dose is recommended after age 82.  Pneumococcal polysaccharide (PPSV23) vaccine. One dose is recommended after age  82. Talk to your health care provider about which screenings and vaccines you need and how often you need them. This information is not intended to replace advice given to you by your health care provider. Make sure you discuss any questions you have with your health care provider. Document Released: 02/15/2015 Document Revised: 10/09/2015 Document Reviewed: 11/20/2014 Elsevier Interactive Patient Education  2017 [place] Prevention in the Home Falls can cause injuries. They can happen to people of all ages. There are many things you can do to make your home safe and to help prevent falls. What can I do on the outside of my home?  Regularly fix the edges of walkways and driveways and fix any cracks.  Remove anything that might make you trip as you walk through a door, such as a raised step or threshold.  Trim any bushes or trees on the path to your home.  Use bright outdoor lighting.  Clear any walking paths of anything that might make someone trip, such as rocks or tools.  Regularly check to see if handrails are loose or broken. Make sure that both sides of any steps have handrails.  Any raised decks and porches should have guardrails on the edges.  Have any leaves, snow, or ice cleared regularly.  Use sand or salt on walking paths during winter.  Clean up any spills in your garage right away. This includes oil or grease spills. What can I do in the bathroom?  Use night lights.  Install grab bars by the toilet and in the tub and shower. Do not use towel bars as grab bars.  Use non-skid mats or decals in the tub or shower.  If you need to sit down in the shower, use a plastic, non-slip stool.  Keep the floor dry. Clean up any water that spills on the floor as soon as it happens.  Remove soap buildup in the tub or shower regularly.  Attach bath mats securely with double-sided non-slip rug tape.  Do not have throw rugs and other things on the floor that can make  you trip. What can I do in the bedroom?  Use night lights.  Make sure that you have a light by your bed that is easy to reach.  Do not use any sheets or blankets that are too big for your bed. They should not hang down onto the floor.  Have a firm chair that has side arms. You can use this for support while you get dressed.  Do not have throw rugs and other things on the floor that can make you trip. What can I do in the kitchen?  Clean up any spills right away.  Avoid walking on wet floors.  Keep items that you use a lot in easy-to-reach places.  If you need to reach something above you, use a strong step stool that has a grab bar.  Keep electrical cords out of the way.  Do not use floor polish or wax that makes floors slippery. If you must use wax, use non-skid floor wax.  Do not have throw rugs and other things on the floor that can make you trip. What can I do with my stairs?  Do not leave any items on the stairs.  Make sure  that there are handrails on both sides of the stairs and use them. Fix handrails that are broken or loose. Make sure that handrails are as long as the stairways.  Check any carpeting to make sure that it is firmly attached to the stairs. Fix any carpet that is loose or worn.  Avoid having throw rugs at the top or bottom of the stairs. If you do have throw rugs, attach them to the floor with carpet tape.  Make sure that you have a light switch at the top of the stairs and the bottom of the stairs. If you do not have them, ask someone to add them for you. What else can I do to help prevent falls?  Wear shoes that:  Do not have high heels.  Have rubber bottoms.  Are comfortable and fit you well.  Are closed at the toe. Do not wear sandals.  If you use a stepladder:  Make sure that it is fully opened. Do not climb a closed stepladder.  Make sure that both sides of the stepladder are locked into place.  Ask someone to hold it for you, if  possible.  Clearly mark and make sure that you can see:  Any grab bars or handrails.  First and last steps.  Where the edge of each step is.  Use tools that help you move around (mobility aids) if they are needed. These include:  Canes.  Walkers.  Scooters.  Crutches.  Turn on the lights when you go into a dark area. Replace any light bulbs as soon as they burn out.  Set up your furniture so you have a clear path. Avoid moving your furniture around.  If any of your floors are uneven, fix them.  If there are any pets around you, be aware of where they are.  Review your medicines with your doctor. Some medicines can make you feel dizzy. This can increase your chance of falling. Ask your doctor what other things that you can do to help prevent falls. This information is not intended to replace advice given to you by your health care provider. Make sure you discuss any questions you have with your health care provider. Document Released: 11/15/2008 Document Revised: 06/27/2015 Document Reviewed: 02/23/2014 Elsevier Interactive Patient Education  2017 [employer].

## 2019-06-20 DIAGNOSIS — Z466 Encounter for fitting and adjustment of urinary device: Secondary | ICD-10-CM | POA: Diagnosis not present

## 2019-06-26 DIAGNOSIS — Z9359 Other cystostomy status: Secondary | ICD-10-CM | POA: Diagnosis not present

## 2019-06-26 DIAGNOSIS — R339 Retention of urine, unspecified: Secondary | ICD-10-CM | POA: Diagnosis not present

## 2019-06-26 DIAGNOSIS — R338 Other retention of urine: Secondary | ICD-10-CM | POA: Diagnosis not present

## 2019-06-26 DIAGNOSIS — N319 Neuromuscular dysfunction of bladder, unspecified: Secondary | ICD-10-CM | POA: Diagnosis not present

## 2019-06-26 DIAGNOSIS — N401 Enlarged prostate with lower urinary tract symptoms: Secondary | ICD-10-CM | POA: Diagnosis not present

## 2019-06-26 DIAGNOSIS — N3020 Other chronic cystitis without hematuria: Secondary | ICD-10-CM | POA: Diagnosis not present

## 2019-07-10 ENCOUNTER — Telehealth: Payer: Self-pay | Admitting: Urology

## 2019-07-10 NOTE — Telephone Encounter (Signed)
Transferring care is fine    Riley [name], [place]

## 2019-07-10 NOTE — Telephone Encounter (Signed)
I just want to confirm that it is okay to transfer care.    Okay to transfer?

## 2019-07-10 NOTE — Telephone Encounter (Signed)
Andrea-Daughter called to speak with staff. She would like to change providers for her father due to moving to [place]. Her father is currently established with Dr. Cletus Gash and Derrek Monaco NP for catheter care. She would like for her father to be seen in [place] with Dr. Izetta Dakin and Joneen Roach NP.    Please call her back to discuss at [phone] (home)

## 2019-07-11 ENCOUNTER — Telehealth: Payer: Self-pay

## 2019-07-11 DIAGNOSIS — Z466 Encounter for fitting and adjustment of urinary device: Secondary | ICD-10-CM | POA: Diagnosis not present

## 2019-07-11 DIAGNOSIS — R339 Retention of urine, unspecified: Secondary | ICD-10-CM | POA: Diagnosis not present

## 2019-07-11 NOTE — Telephone Encounter (Signed)
Incoming call received from patients daughter requesting a letter indicating she is the caregiver for patient in order for her to be excused from a [employer].

## 2019-07-12 NOTE — Telephone Encounter (Signed)
To clarify it is [name] needing the note. I can provide a letter but there is no guarantee that it will be acceptable excuse from a jury summons.

## 2019-07-12 NOTE — Telephone Encounter (Signed)
Correct

## 2019-07-12 NOTE — Telephone Encounter (Signed)
[name] is aware letter is complete and she plans to pick up tomorrow.  Letter placed at the front desk in filing folder under the D's for [place].

## 2019-07-13 NOTE — Telephone Encounter (Signed)
Called [name]- told her we were given the ok for patient to transfer. Patient's last catheter change was 06/08. I don't know if he has VNA- but they (the family) said they get this changed every 3 weeks.    Do you want to talk to [name] and get more information on patient before I schedule? I was going to add him to 06/28 as that would be the 3 week mark for his last cath change. I don't know if we are changing his catheter or if we are just taking over the care.    [name] wanted me to call her later on today to make an appointment as she is not home right now.

## 2019-07-24 ENCOUNTER — Telehealth: Payer: Self-pay | Admitting: Urology

## 2019-07-24 NOTE — Telephone Encounter (Signed)
Copied from [place] [id number]. Topic: Appointments - Schedule Appointment  >> Jul 24, 2019 10:39 AM Kolina Kube, Hilda Blades wrote:  .Patient's daughter is calling. Patient is a patient of S. Hyland, NP and would like to move to Dr. Leandrew Koyanagi practice & be seen in the [place] office.  They are presently out of state & moving back to [place] in July.    Please contact [name] to schedule at: [phone]

## 2019-07-28 NOTE — Telephone Encounter (Signed)
Called pt daughter and discussed pt. He has a suprapubic catheter since 2015, has cath changes every 3 weeks, [facility] will go to house to change if they have orders. Scheduled for change on 06/29 so appointment needed to be prior to next change on 07/20. Once care is established we can send orders for [facility] to do cath changes.

## 2019-08-02 ENCOUNTER — Other Ambulatory Visit: Payer: Self-pay | Admitting: Nurse Practitioner

## 2019-08-02 DIAGNOSIS — G609 Hereditary and idiopathic neuropathy, unspecified: Secondary | ICD-10-CM

## 2019-08-17 ENCOUNTER — Encounter: Payer: Self-pay | Admitting: Urology

## 2019-08-17 ENCOUNTER — Encounter: Payer: Self-pay | Admitting: Gastroenterology

## 2019-08-17 ENCOUNTER — Ambulatory Visit: Payer: Medicare (Managed Care) | Admitting: Urology

## 2019-08-17 VITALS — BP 167/79 | HR 70 | Ht 70.0 in | Wt 180.0 lb

## 2019-08-17 DIAGNOSIS — N319 Neuromuscular dysfunction of bladder, unspecified: Secondary | ICD-10-CM

## 2019-08-17 DIAGNOSIS — N318 Other neuromuscular dysfunction of bladder: Secondary | ICD-10-CM

## 2019-08-17 DIAGNOSIS — R338 Other retention of urine: Secondary | ICD-10-CM

## 2019-08-17 NOTE — Progress Notes (Signed)
Dear Dr. Reginal Lutes, MD,     Thank you for referring Mr. Gwen Her Robitaille to me for consultation for his neurogenic bladder. I saw Mr. Saud Bail Zumbro on 08/17/2019.     HPI:  84 y.o. yo male referred to our office for neurogenic bladder SP tube care.   PMH significant for COPD, asthma, testicular cancer s/p left orchiectomy, prediabetes, HTN, HLD.  Previously evaluated for neurogenic bladder following meningitis in 2015, currently managed via suprapubic catheter x6 years  Patient lives with daughter, he is legally deaf and legally blind. His daughter [name] has supplemented much of the history. She is his primary care taker. Patient previously lived full time in [place] and has since moved to the area.     SPT is exchanged by his daughter and by [facility] nurses. Cath changes occur every 3 weeks without issue.  Reports good urine output, clear color   Very minimal heme noted on occasion which clears up within a few hours   Notes moderate sediment concerns which they continue use of renacindin daily,last UTI fall 2020     Daughter requesting orders of in home nursing assistance and catheter supplies.     Patient reports prior history of elevated PSA with with NEG TRUS bx x2, 10-15 years ago.     Non smoker  No family history of prostate, bladder, kidney, testicular cancers.   Allergies   Allergen Reactions    Peanut-Derived      Throat swells      No Known Drug Allergy      Created by Conversion - 0;        Past Medical History:   Diagnosis Date    Asthma     Cerebral artery occlusion with cerebral infarction     COPD (chronic obstructive pulmonary disease)     COPD (chronic obstructive pulmonary disease)     Hyperlipidemia     Hypertension     Meningitis     Testicle cancer        Past Surgical History:   Procedure Laterality Date    APPENDECTOMY      COCHLEAR IMPLANT      left ear    EYE SURGERY      PROSTATE SURGERY      superpubic cath      TESTICLE REMOVAL      TONSILLECTOMY AND  ADENOIDECTOMY         Current Outpatient Medications   Medication Sig    calcium carbonate 250 mg/mL suspension Take 1,250 mg by mouth 2 times daily (with meals)    magnesium gluconate (MAGONATE) 500 MG tablet Take 500 mg by mouth 2 times daily    Citric Ac-Gluconolact-Mg Carb (RENACIDIN) 30 mLs by Intracatheter route    fluticasone furoate-vilanterol (BREO ELLIPTA) 100-25 mcg/inhalation inhaler Inhale 1 puff into the lungs daily    Lactobacillus (ACIDOPHILUS) 100 MG CAPS Take 100 capsules by mouth daily    amLODIPine (NORVASC) 5 MG tablet Take 5 mg by mouth daily    citalopram (CELEXA) 20 MG tablet Take 20 mg by mouth daily    Melatonin 10 MG CAPS Take 10 mg by mouth daily    triamcinolone (KENALOG) 0.1 % cream Apply topically daily    traZODone (DESYREL) 50 MG tablet Take 50 mg by mouth nightly    atorvastatin (LIPITOR) 10 MG tablet     pantoprazole (PROTONIX) 40 MG EC tablet TAKE ONE TABLET BY MOUTH ONCE DAILY FOR STOMACH    oxybutynin (  DITROPAN-XL) 10 MG 24 hr tablet Take 1 tablet (10 mg total) by mouth daily Swallow whole. Do not crush, break, or chew. (Patient not taking: Reported on 08/17/2019)       Family History   Problem Relation Age of Onset    Heart Disease Father        Social History     Socioeconomic History    Marital status: Married     Spouse name: Not on file    Number of children: Not on file    Years of education: Not on file    Highest education level: Not on file   Tobacco Use    Smoking status: Former Smoker     Packs/day: 1.00     Years: 10.00     Pack years: 10.00     Types: Cigarettes     Quit date: 11/17/1965     Years since quitting: 53.7    Smokeless tobacco: Never Used   Substance and Sexual Activity    Alcohol use: Never     Alcohol/week: 0.0 standard drinks    Drug use: Never    Sexual activity: Not Currently   Other Topics Concern    Not on file   Social History Narrative    Not on file       REVIEW OF SYSTEMS:  Systemic: Denies recent weight loss or gain or  fatigue.  Eyes: Denies change in vision  ENT: Denies hearing problems, or loss. Denies swollen glands or stiff neck.  Chest: Denies recent cold, flu or upper respiratory illness. Denies cough or dyspnea.  Heart: Denies recent heart trouble, chest pain or palpitations.  GI: Denies constipation, diarrhea, acid reflux or bloody stool.  GU: See HPI.  Musculoskeletal: Denies any muscle or joint pain stiffness or arthritis.  Skin: Denies rash, ulcers or skin problems.  Neuro: Denies numbness, tingling, dizziness or headaches.  Pysch: Denies depression, anxiety or psychosis.  Endocrine: Denies diabetes, hypothyroidism or hyperthyroidism.  Hematology: Denies recent bleeding, bruising or anemia.  Allergy/Immunological: Denies runny nose, allergic rhinitis, or itching eyes.    Physical Exam:  Vitals:    08/17/19 1510   BP: 167/79   Pulse: 70   Weight: 81.6 kg (180 lb)   Height: 1.778 m (5\' 10" )     GENERAL:  Deaf/leagally blind. No acute distress, well developed, well nourished. Wheelchair bound, unable to ambulate.    NEUROLOGIC:  Oriented to person, place, time, and situation.  PSYCHIATRIC:  Normal mood and affect.  HEENT:  Normocephalic, atraumatic.  SKIN:  Normal color, turgor, texture, hydration.  RESPIRATORY:  Respirations unlabored.    LYMPHATIC:  Normal cervical, supraclavicular, and inguinal examination.  BACK/ORTHO:  No costovertebral angle tenderness.  No tenderness of the axial skeleton.  No obvious back deformities.  ABDOMINAL:  Abdomen soft,nontender, nondistended, and without masses. No signs of inguinal and umbilical hernias.  PULSES:  groin palpable bilaterally and symmetric.  EXTREMITIES:  Without cyanosis, clubbing, or edema. Calves nontender.  GENITOURINARY: deferred.     Labs:  No results found for this or any previous visit (from the past 24 hour(s)).    Lab Results   Component Value Date    PSAR3 2.81 10/31/2010    PSAR3 3.01 10/24/2009        Imaging:  08/15/2018-Renal US:  FINDINGS:   Right Kidney:  Measures 10.8 cm.   Parenchyma: Normal homogeneous echotexture.   Hydronephrosis: Mild hydronephrosis.   Calculi: None.   Focal Lesions: None.  Left Kidney: Measures 13.6 cm.   Parenchyma: Normal homogeneous echotexture.   Hydronephrosis: Mild hydronephrosis.   Calculi: None.   Focal Lesions: None.     Aorta: Non-aneurysmal.     Inferior Vena Cava: Patent.     Bladder: Foley catheter in place.   Jets: Bilateral ureteral jets are documented.   Prevoid Volume: Not measured.   Postvoid Residual Volume: Not measured.     Prostate: Enlarged measuring 25 cc..     Miscellaneous: None.       Impression   Enlarged prostate.   Bilateral mild hydronephrosis        IMPRESSION: 84 y.o. male with neurogenic bladder with chronic urinary retention following meningitis in 2015, currently with chronic SP tube placement.  History of elevated PSA with negative TRUS biopsy x2    PLAN:  We will again get [facility] nursing care for in-home assistance set up  Continue SP tube changes every 3 weeks and as needed flushing for obstruction  Continue Renacidin installations daily and encouraged adequate water intake.  Patient does not wish to proceed with further evaluation of his PSA  Renal ultrasound consistent with mild bilateral hydronephrosis we will get updated renal ultrasound to further evaluate and call with results    They will follow up on yearly basis with further questions/concerns    Thank you for allowing to participate in your patient care. I will keep you informed regarding his future follow-up.      Sincerely,    Vena Rua, [place]    7/15/20213:53 PM

## 2019-08-17 NOTE — Addendum Note (Signed)
Addended byGenene Churn on: 08/17/2019 04:21 PM     Modules accepted: Orders

## 2019-08-21 ENCOUNTER — Telehealth: Payer: Self-pay | Admitting: Urology

## 2019-08-21 DIAGNOSIS — I10 Essential (primary) hypertension: Secondary | ICD-10-CM | POA: Diagnosis not present

## 2019-08-21 DIAGNOSIS — Z466 Encounter for fitting and adjustment of urinary device: Secondary | ICD-10-CM | POA: Diagnosis not present

## 2019-08-21 DIAGNOSIS — Z4889 Encounter for other specified surgical aftercare: Secondary | ICD-10-CM | POA: Diagnosis not present

## 2019-08-21 DIAGNOSIS — N319 Neuromuscular dysfunction of bladder, unspecified: Secondary | ICD-10-CM | POA: Diagnosis not present

## 2019-08-21 DIAGNOSIS — H548 Legal blindness, as defined in USA: Secondary | ICD-10-CM | POA: Diagnosis not present

## 2019-08-21 DIAGNOSIS — Z7951 Long term (current) use of inhaled steroids: Secondary | ICD-10-CM | POA: Diagnosis not present

## 2019-08-21 DIAGNOSIS — J449 Chronic obstructive pulmonary disease, unspecified: Secondary | ICD-10-CM | POA: Diagnosis not present

## 2019-08-21 DIAGNOSIS — Z435 Encounter for attention to cystostomy: Secondary | ICD-10-CM | POA: Diagnosis not present

## 2019-08-21 DIAGNOSIS — Z7409 Other reduced mobility: Secondary | ICD-10-CM | POA: Diagnosis not present

## 2019-08-21 NOTE — Telephone Encounter (Signed)
Noted  

## 2019-08-21 NOTE — Telephone Encounter (Signed)
Copied from [place] [id number]. Topic: Access to Care - Speak to Provider/Office Staff  >> Aug 21, 2019  2:55 PM Darrick Grinder, Geoffery Lyons wrote:  Marita Kansas from  [facility] care Nurse is calling for Riley [name], she states  Patient is open to services today.  Marita Kansas can be reached at [phone]  Writer informed Marita Kansas her message would be sent to the nurses

## 2019-08-31 DIAGNOSIS — J440 Chronic obstructive pulmonary disease with acute lower respiratory infection: Secondary | ICD-10-CM | POA: Diagnosis not present

## 2019-08-31 DIAGNOSIS — N319 Neuromuscular dysfunction of bladder, unspecified: Secondary | ICD-10-CM | POA: Diagnosis not present

## 2019-08-31 DIAGNOSIS — I10 Essential (primary) hypertension: Secondary | ICD-10-CM | POA: Diagnosis not present

## 2019-08-31 DIAGNOSIS — G629 Polyneuropathy, unspecified: Secondary | ICD-10-CM | POA: Diagnosis not present

## 2019-08-31 DIAGNOSIS — K219 Gastro-esophageal reflux disease without esophagitis: Secondary | ICD-10-CM | POA: Diagnosis not present

## 2019-08-31 DIAGNOSIS — R69 Illness, unspecified: Secondary | ICD-10-CM | POA: Diagnosis not present

## 2019-08-31 DIAGNOSIS — H9193 Unspecified hearing loss, bilateral: Secondary | ICD-10-CM | POA: Diagnosis not present

## 2019-08-31 DIAGNOSIS — E7800 Pure hypercholesterolemia, unspecified: Secondary | ICD-10-CM | POA: Diagnosis not present

## 2019-09-04 ENCOUNTER — Ambulatory Visit: Payer: Medicare (Managed Care) | Attending: Family Medicine | Admitting: Audiology

## 2019-09-04 DIAGNOSIS — H903 Sensorineural hearing loss, bilateral: Secondary | ICD-10-CM | POA: Insufficient documentation

## 2019-09-04 NOTE — Progress Notes (Addendum)
Cochlear Implant Appointment         Audiology  Part of [facility]  [address] [place], [place] [address]  Phone: [phone]  Fax: [phone]     Patient Name: Riley [name]. [name]  MRN: [id number]  Date of Birth: [birth year]  Date of Service: 09/04/2019    Internal device: CI 512 Profile with Contour Advance (SN: [id number])  Remote control CR210 (SN: [id number]), remote Control [id number]  Phone Clip, TV streamer and Mini Mic    Riley [name] was seen for a cochlear implant appointment, accompanied by his daughter Riley [name].  Riley [name] was implanted with a CI 512 Profile with Contour Advance (SN: [id number]) in his left ear on 09/07/2013 by Dr. Genia Plants at the [facility].  Riley [name] makes use of a [employer] CP910 processor (SN: ending in [id number]).  He was initially activated on 10/03/2013 in [place], however shortly after activation he moved to [place].  Reportedly, the clinic in [place] shut down and the patient was unable to retrieve maps from this location.  The family has since relocated to the [place] area in July 2021. Today, Riley [name] is here is establish care.      He reports overall he is doing very well with the CI, however he still continues to struggle with multiple speakers , in background noise is present or if the speaker is not face to face with him.  He was counseled that those situations are the most difficult listening environments and he will understand speech the best in a quiet environment, with face-to-face communication with one person. Daughter was reminded to have the speakers talk a little slower and not shout.      His maps were able to be retrieved from the processor and saved to our programming software.  M and T levels were adjusted slightly. Riley [name] was encouraged to use the processor set to this slightly louder setting for better speech understanding. His speech recognition ability is improved at this increased volume.   He reported it was tolerable and he noticed he was hearing speech more clearly.  I did try a few other changes but none of these changes resulting in better hearing.        The N6 CP910 processor (SN: [id number]) was programmed as follows:  Map 17, position 1: ACE, SCAN, ADRO, ASC, SNR-NR, WNR  Map 17, position 2: ACE, ASC, SNR-NR, WNR    Both Riley [name] and his daughter had questions regarding the upgrade process.  We informed them that in September 2022, the CP 900 series processor will be considered obsolete. Until that time, they should continue to make use of the current processor as it is in working order.     The microphone covers were missing from the processor and part of the old covers were broken in the port.  Because the patient is also blind, I demonstrated to his daughter how to remove/replace the filters.  I encouraged her to reach out to [employer] to obtain more covers for their personal stock.     Patient also was interested in listening through headphones.  He does have a mini mic which has a 1/8 inch stereo input and when we checked he does have a stereo to stereo male to male adaptor cable.  The mini mic has not been charged and will first need to be charged and then checked to see if the mini mic is still paired  with this current processor.  Daughter was shown how to connect the patch cable to the mini mic and to the device he wants to listen to.  The mixing ratio is set at 2:1.  We discussed this as well and decided at this time to keep the mixing ration at 2:1    Recommendations: I encouraged the patient to reach out with any hearing/CI related questions or concerns and to schedule an appointment with me if concerns arise.     Haskel Schroeder, B.A.  Audiology Licensure [facility]    Ophelia Shoulder. Linward Foster, PhD, MBA  Director of Research and Education in Audiology   [facility] - Audiology

## 2019-09-13 ENCOUNTER — Encounter: Payer: Self-pay | Admitting: Urology

## 2019-10-18 ENCOUNTER — Encounter: Payer: Self-pay | Admitting: Urology

## 2019-10-18 NOTE — Telephone Encounter (Signed)
Letter sent to patient on 09/13/19. [place] scheduled 09/21/19. MyChart sent back to patient. Thanks [employer]         PLAN:  We will again get lifetime nursing care for in-home assistance set up  Continue SP tube changes every 3 weeks and as needed flushing for obstruction  Continue Renacidin installations daily and encouraged adequate water intake.  Patient does not wish to proceed with further evaluation of his PSA  Renal ultrasound consistent with mild bilateral hydronephrosis we will get updated renal ultrasound to further evaluate and call with results    They will follow up on yearly basis with further questions/concerns    Thank you for allowing to participate in your patient care. I will keep you informed regarding his future follow-up.      Sincerely,    Vena Rua, [place]    7/15/20213:53 PM

## 2019-10-25 ENCOUNTER — Other Ambulatory Visit: Payer: Self-pay | Admitting: Nurse Practitioner

## 2019-10-25 DIAGNOSIS — J438 Other emphysema: Secondary | ICD-10-CM

## 2019-11-08 ENCOUNTER — Other Ambulatory Visit: Payer: Self-pay | Admitting: Nurse Practitioner

## 2019-11-08 ENCOUNTER — Telehealth: Payer: Self-pay | Admitting: Urology

## 2019-11-08 DIAGNOSIS — I10 Essential (primary) hypertension: Secondary | ICD-10-CM

## 2019-11-08 DIAGNOSIS — E782 Mixed hyperlipidemia: Secondary | ICD-10-CM

## 2019-11-08 DIAGNOSIS — F411 Generalized anxiety disorder: Secondary | ICD-10-CM

## 2019-11-08 NOTE — Telephone Encounter (Signed)
Forms faxed

## 2019-11-08 NOTE — Telephone Encounter (Signed)
Copied from [place] [phone]. Topic: Access to Care - Speak to Provider/Office Staff  >> Nov 08, 2019 12:26 PM Riley [name] wrote:  .[facility]  is calling to inquire if plan of care forms were received in the mail. She states the forms were mailed 10/24/19. She states the forms need to be filled out as soon as possible and returned. Writer gave fax number for [employer] office.     She can be reached at [phone] to confirm mail received.

## 2019-11-09 ENCOUNTER — Telehealth: Payer: Self-pay | Admitting: Urology

## 2019-11-09 ENCOUNTER — Encounter: Payer: Self-pay | Admitting: Gastroenterology

## 2019-11-09 NOTE — Telephone Encounter (Signed)
Copied from [place] [phone]. Topic: Access to Care - Speak to Provider/Office Staff  >> Nov 09, 2019 11:03 AM [name] wrote:  [facility] is calling advise she has not received the completed [facility] plan of care form she faxed yesterday See previous encounter She states the completed form cn be faxed to [phone] ATTN: [name]     She can be reached at [phone] to confirm.

## 2019-11-09 NOTE — Telephone Encounter (Signed)
Spoke with Riley [name] and stated we faxed the information 2x already. She will refax the form and we will send again.    Received forms and refaxed.

## 2019-11-09 NOTE — Telephone Encounter (Signed)
Riley [name] called access center, and was transferred to writer from access center, however call was lost. Probation officer attempted to call number back to speak with Riley [name], however the center where the call went did not know specifically who that was.     Thanks [employer]

## 2019-11-21 ENCOUNTER — Other Ambulatory Visit: Payer: Self-pay | Admitting: Nurse Practitioner

## 2019-11-24 ENCOUNTER — Other Ambulatory Visit: Payer: Self-pay | Admitting: Nurse Practitioner

## 2019-11-24 DIAGNOSIS — G4700 Insomnia, unspecified: Secondary | ICD-10-CM

## 2019-11-25 ENCOUNTER — Other Ambulatory Visit: Payer: Self-pay | Admitting: Nurse Practitioner

## 2019-11-25 DIAGNOSIS — I10 Essential (primary) hypertension: Secondary | ICD-10-CM

## 2019-11-27 NOTE — Telephone Encounter (Signed)
Pt moved this summer. Unsure why they are sending [place] the refil request.

## 2019-11-27 NOTE — Telephone Encounter (Signed)
Updated chart, removed Sherrie Mustache, Np as PCP

## 2019-11-27 NOTE — Telephone Encounter (Signed)
Patient does not have pending appointment. Please advise when patient needs to seen again

## 2019-12-08 ENCOUNTER — Other Ambulatory Visit: Payer: Self-pay | Admitting: Nurse Practitioner

## 2019-12-08 DIAGNOSIS — G4700 Insomnia, unspecified: Secondary | ICD-10-CM

## 2019-12-11 ENCOUNTER — Telehealth: Payer: Self-pay | Admitting: Urology

## 2019-12-11 NOTE — Telephone Encounter (Signed)
Copied from [place] [phone]. Topic: Access to Care - Speak to Provider/Office Staff  >> Dec 11, 2019  9:43 AM Penni Bombard wrote:  Riley [name]- Homecare is requesting the most recent office note be faxed to [phone]

## 2019-12-12 ENCOUNTER — Other Ambulatory Visit: Payer: Self-pay | Admitting: Nurse Practitioner

## 2019-12-12 DIAGNOSIS — I10 Essential (primary) hypertension: Secondary | ICD-10-CM

## 2019-12-12 DIAGNOSIS — E782 Mixed hyperlipidemia: Secondary | ICD-10-CM

## 2019-12-12 NOTE — Telephone Encounter (Signed)
No pending appointment, it is unclear when patient is to have a follow-up appointment.  Last cholesterol panel greater than 12 months ago  I will send to Lauree Chandler, NP to advise

## 2019-12-25 ENCOUNTER — Telehealth: Payer: Self-pay | Admitting: Urology

## 2019-12-25 NOTE — Telephone Encounter (Signed)
Copied from [place] #[id number]. Topic: Return Call - Speak to Provider/Office Staff  >> Dec 25, 2019  2:21 PM August Luz wrote:  [facility] is requesting to speak with Dr.Alams secretary . Patient states it is in regards to Plan of care being signed and sent.    Able to leave a message on voicemail: yes.    [facility]  Fax: [phone]   Call back [phone]

## 2019-12-25 NOTE — Telephone Encounter (Signed)
Refaxed the home care orders

## 2019-12-26 ENCOUNTER — Encounter: Payer: Self-pay | Admitting: Gastroenterology

## 2020-01-04 ENCOUNTER — Other Ambulatory Visit: Payer: Self-pay | Admitting: Nurse Practitioner

## 2020-01-04 DIAGNOSIS — E782 Mixed hyperlipidemia: Secondary | ICD-10-CM

## 2020-01-09 ENCOUNTER — Other Ambulatory Visit: Payer: Self-pay | Admitting: Nurse Practitioner

## 2020-01-09 DIAGNOSIS — I10 Essential (primary) hypertension: Secondary | ICD-10-CM

## 2020-01-09 DIAGNOSIS — E782 Mixed hyperlipidemia: Secondary | ICD-10-CM

## 2020-02-13 ENCOUNTER — Other Ambulatory Visit: Payer: Self-pay | Admitting: Nurse Practitioner

## 2020-03-28 ENCOUNTER — Other Ambulatory Visit: Payer: Self-pay | Admitting: Family

## 2020-03-28 DIAGNOSIS — G4700 Insomnia, unspecified: Secondary | ICD-10-CM

## 2020-04-08 ENCOUNTER — Ambulatory Visit
Admission: RE | Admit: 2020-04-08 | Discharge: 2020-04-08 | Disposition: A | Discharge status: Home / Self Care | Payer: Medicare (Managed Care) | Source: Ambulatory Visit | Attending: Urology | Admitting: Urology

## 2020-04-08 DIAGNOSIS — N319 Neuromuscular dysfunction of bladder, unspecified: Secondary | ICD-10-CM

## 2020-04-20 ENCOUNTER — Other Ambulatory Visit: Payer: Self-pay | Admitting: Family

## 2020-04-20 DIAGNOSIS — G4700 Insomnia, unspecified: Secondary | ICD-10-CM

## 2020-05-19 ENCOUNTER — Other Ambulatory Visit: Payer: Self-pay | Admitting: Family

## 2020-05-19 DIAGNOSIS — G4700 Insomnia, unspecified: Secondary | ICD-10-CM

## 2020-05-20 ENCOUNTER — Other Ambulatory Visit: Payer: Self-pay | Admitting: Nurse Practitioner

## 2020-05-20 DIAGNOSIS — I10 Essential (primary) hypertension: Secondary | ICD-10-CM

## 2020-05-21 ENCOUNTER — Other Ambulatory Visit
Admission: RE | Admit: 2020-05-21 | Discharge: 2020-05-21 | Disposition: A | Discharge status: Home / Self Care | Payer: Medicare (Managed Care) | Source: Ambulatory Visit | Attending: Family Medicine | Admitting: Family Medicine

## 2020-05-21 DIAGNOSIS — I10 Essential (primary) hypertension: Secondary | ICD-10-CM | POA: Insufficient documentation

## 2020-05-21 DIAGNOSIS — G629 Polyneuropathy, unspecified: Secondary | ICD-10-CM | POA: Insufficient documentation

## 2020-05-21 DIAGNOSIS — E7800 Pure hypercholesterolemia, unspecified: Secondary | ICD-10-CM | POA: Insufficient documentation

## 2020-05-21 LAB — COMPREHENSIVE METABOLIC PANEL
ALT: 13 U/L (ref 0–50)
AST: 19 U/L (ref 0–50)
Albumin: 4.3 g/dL (ref 3.5–5.2)
Alk Phos: 69 U/L (ref 40–130)
Anion Gap: 13 (ref 7–16)
Bilirubin,Total: 0.2 mg/dL (ref 0.0–1.2)
CO2: 23 mmol/L (ref 20–28)
Calcium: 9 mg/dL (ref 8.6–10.2)
Chloride: 99 mmol/L (ref 96–108)
Creatinine: 1.31 mg/dL — ABNORMAL HIGH (ref 0.67–1.17)
Glucose: 83 mg/dL (ref 60–99)
Lab: 22 mg/dL — ABNORMAL HIGH (ref 6–20)
Potassium: 4.9 mmol/L (ref 3.3–5.1)
Sodium: 135 mmol/L (ref 133–145)
Total Protein: 6.5 g/dL (ref 6.3–7.7)
eGFR BY CREAT: 54 * — AB

## 2020-05-21 LAB — CBC AND DIFFERENTIAL
Baso # K/uL: 0.1 10*3/uL (ref 0.0–0.1)
Basophil %: 0.7 %
Eos # K/uL: 0.1 10*3/uL (ref 0.0–0.5)
Eosinophil %: 1.1 %
Hematocrit: 40 % (ref 40–51)
Hemoglobin: 13.3 g/dL — ABNORMAL LOW (ref 13.7–17.5)
IMM Granulocytes #: 0.1 10*3/uL — ABNORMAL HIGH (ref 0.0–0.0)
IMM Granulocytes: 1 %
Lymph # K/uL: 1.9 10*3/uL (ref 1.3–3.6)
Lymphocyte %: 21.2 %
MCH: 32 pg (ref 26–32)
MCHC: 33 g/dL (ref 32–37)
MCV: 96 fL — ABNORMAL HIGH (ref 79–92)
Mono # K/uL: 1.2 10*3/uL — ABNORMAL HIGH (ref 0.3–0.8)
Monocyte %: 12.8 %
Neut # K/uL: 5.8 10*3/uL — ABNORMAL HIGH (ref 1.8–5.4)
Nucl RBC # K/uL: 0 10*3/uL (ref 0.0–0.0)
Nucl RBC %: 0 /100 WBC (ref 0.0–0.2)
Platelets: 236 10*3/uL (ref 150–330)
RBC: 4.2 MIL/uL — ABNORMAL LOW (ref 4.6–6.1)
RDW: 13.4 % (ref 11.6–14.4)
Seg Neut %: 63.2 %
WBC: 9.1 10*3/uL (ref 4.2–9.1)

## 2020-05-21 LAB — TSH: TSH: 1.65 u[IU]/mL (ref 0.27–4.20)

## 2020-05-21 LAB — LIPID PANEL
Chol/HDL Ratio: 2.1
Cholesterol: 163 mg/dL
HDL: 77 mg/dL — ABNORMAL HIGH (ref 40–60)
LDL Calculated: 73 mg/dL
Non HDL Cholesterol: 86 mg/dL
Triglycerides: 65 mg/dL

## 2020-05-21 LAB — T4, FREE: Free T4: 1.2 ng/dL (ref 0.9–1.7)

## 2020-06-03 ENCOUNTER — Other Ambulatory Visit: Payer: Self-pay | Admitting: Urology

## 2020-06-03 MED ORDER — RENACIDIN IR SOLN *I*
30.0000 mL | Freq: Every day | 4 refills | Status: DC
Start: 2020-06-03 — End: 2020-09-20

## 2020-06-03 NOTE — Telephone Encounter (Signed)
Copied from [place] #[id number]. Topic: Medications/Prescriptions - Refill Request  >> Jun 03, 2020  8:22 AM Mable Fill wrote:  Patients daughter [name] (ok per Juluis Rainier) is calling to request prescription(s)   Requested Prescriptions     Pending Prescriptions Disp Refills    Citric Ac-Gluconolact-Mg Carb (RENACIDIN)       Sig: 30 mLs by Intracatheter route       to be sent to the following Pharmacy [facility]    Is patient out of the medication? yes    Patient's last visit? 08/17/2019    Patient can be reached if necessary at  [phone] (home)

## 2020-06-26 ENCOUNTER — Encounter: Payer: Self-pay | Admitting: Gastroenterology

## 2020-06-26 ENCOUNTER — Telehealth: Payer: Self-pay | Admitting: Urology

## 2020-06-26 NOTE — Telephone Encounter (Signed)
Called and spoke with Verdis Frederickson.   Orders faxed  Thanks Lovena Le

## 2020-06-26 NOTE — Telephone Encounter (Signed)
Copied from [place] #[id number]. Topic: Access to Care - Labs/Orders/Imaging  >> Jun 26, 2020  9:44 AM Laury Deep wrote:  [facility] is calling to check the status of the orders for 06/04/20 -08/02/20 for Riley [name]  Please call to advise at [phone]

## 2020-07-12 ENCOUNTER — Encounter: Payer: Self-pay | Admitting: Gastroenterology

## 2020-08-21 ENCOUNTER — Encounter: Payer: Self-pay | Admitting: Urology

## 2020-08-21 ENCOUNTER — Ambulatory Visit: Payer: Medicare (Managed Care) | Admitting: Urology

## 2020-08-21 VITALS — Ht 71.0 in | Wt 173.0 lb

## 2020-08-21 DIAGNOSIS — N319 Neuromuscular dysfunction of bladder, unspecified: Secondary | ICD-10-CM

## 2020-08-21 NOTE — Progress Notes (Signed)
Dear Dr. Reginal Lutes, MD,     Thank you for referring [name] to me for consultation for his neurogenic bladder. I saw [name] on 08/21/2020.     HPI:  85 y.o. yo male referred to our office for neurogenic bladder SP tube care.   PMH significant for COPD, asthma, testicular cancer s/p left orchiectomy, prediabetes, HTN, HLD.  Previously evaluated for neurogenic bladder following meningitis in 2015, currently managed via suprapubic catheter x6 years  Patient lives with daughter, he is legally deaf and legally blind. His daughter Riley [name] has supplemented much of the history. She is his primary care taker. Patient previously lived full time in [place] and has since moved to the area.     Interim history:  Patient presents for 1 year follow-up  SPT is exchanged by his daughter and by [facility] nurses in home.   Cath changes occur every 3 weeks without issue.  Reports good urine output, clear color   Very minimal heme noted on occasion which clears up within a few hours   Notes moderate sediment concerns which they continue use of renacindin daily  Denies any recent infection concerns  Denies any fever, chills, abdominal/flank pain, gross hematuria    Updated renal ultrasound shows normal findings with prior resolution of reported hydronephrosis    Patient reports prior history of elevated PSA with with NEG TRUS bx x2, 10-15 years ago.   Does not any with PSA testing     Non smoker  No family history of prostate, bladder, kidney, testicular cancers.   Allergies   Allergen Reactions    Peanut-Derived      Throat swells      No Known Drug Allergy      Created by Conversion - 0;        Past Medical History:   Diagnosis Date    Asthma     Cerebral artery occlusion with cerebral infarction     COPD (chronic obstructive pulmonary disease)     COPD (chronic obstructive pulmonary disease)     Hyperlipidemia     Hypertension     Meningitis     Testicle cancer        Past Surgical History:    Procedure Laterality Date    APPENDECTOMY      COCHLEAR IMPLANT      left ear    EYE SURGERY      PROSTATE SURGERY      superpubic cath      TESTICLE REMOVAL      TONSILLECTOMY AND ADENOIDECTOMY         Current Outpatient Medications   Medication Sig    furosemide (LASIX) 40 mg tablet Take 40 mg by mouth every morning    Citric Ac-Gluconolact-Mg Carb (RENACIDIN) 30 mLs by Intracatheter route daily    calcium carbonate 250 mg/mL suspension Take 1,250 mg by mouth 2 times daily (with meals)    magnesium gluconate (MAGONATE) 500 MG tablet Take 500 mg by mouth 2 times daily    Lactobacillus (ACIDOPHILUS) 100 MG CAPS Take 100 capsules by mouth daily    amLODIPine (NORVASC) 5 MG tablet Take 5 mg by mouth daily    citalopram (CELEXA) 20 MG tablet Take 20 mg by mouth daily    Melatonin 10 MG CAPS Take 10 mg by mouth daily    triamcinolone (KENALOG) 0.1 % cream Apply topically daily    traZODone (DESYREL) 50 MG tablet Take 50 mg by mouth  nightly    atorvastatin (LIPITOR) 10 MG tablet        Family History   Problem Relation Age of Onset    Heart Disease Father        Social History     Socioeconomic History    Marital status: Married     Spouse name: Not on file    Number of children: Not on file    Years of education: Not on file    Highest education level: Not on file   Tobacco Use    Smoking status: Former Smoker     Packs/day: 1.00     Years: 10.00     Pack years: 10.00     Types: Cigarettes     Quit date: 11/17/1965     Years since quitting: 54.7    Smokeless tobacco: Never Used   Substance and Sexual Activity    Alcohol use: Never     Alcohol/week: 0.0 standard drinks    Drug use: Never    Sexual activity: Not Currently   Other Topics Concern    Not on file   Social History Narrative    Not on file       Physical Exam:  Vitals:    08/21/20 1028   Weight: 78.5 kg (173 lb)   Height: 1.803 m (5\' 11" )     GENERAL:  Deaf/leagally blind. No acute distress, well developed, well nourished.  Wheelchair bound, unable to ambulate.    NEUROLOGIC:  Oriented to person, place, time, and situation.  PSYCHIATRIC:  Normal mood and affect.  HEENT:  Normocephalic, atraumatic.  SKIN:  Normal color, turgor, texture, hydration.  RESPIRATORY:  Respirations unlabored.    BACK/ORTHO:  No costovertebral angle tenderness.  No tenderness of the axial skeleton.  No obvious back deformities.  ABDOMINAL:  Abdomen soft,nontender, nondistended, and without masses.  EXTREMITIES:  Without cyanosis, clubbing, or edema. Calves nontender.  GENITOURINARY: deferred.     Labs:  No results found for this or any previous visit (from the past 24 hour(s)).    Lab Results   Component Value Date    PSAR3 2.81 10/31/2010    PSAR3 3.01 10/24/2009        Imaging:  3//2022-Renal US:  FINDINGS:   Right Kidney: Measures 11.2 cm.   Parenchyma: Normal homogeneous echotexture.   Hydronephrosis: None.   Calculi: None.   Focal Lesions: None.     Left Kidney: Measures 10.0 cm.   Parenchyma: Normal homogeneous echotexture.   Hydronephrosis: None.   Calculi: None.   Focal Lesions: None.     Aorta: Non-aneurysmal.     Inferior Vena Cava: Patent.     Bladder: Foley catheter visualized.   Jets: Bilateral ureteral jets are documented.   Prevoid Volume: Not measured.   Postvoid Residual Volume: Not measured.     Prostate: Not visualized.     Miscellaneous: None.       Impression   Normal renal ultrasound.        08/15/2018-Renal US:  FINDINGS:   Right Kidney: Measures 10.8 cm.   Parenchyma: Normal homogeneous echotexture.   Hydronephrosis: Mild hydronephrosis.   Calculi: None.   Focal Lesions: None.     Left Kidney: Measures 13.6 cm.   Parenchyma: Normal homogeneous echotexture.   Hydronephrosis: Mild hydronephrosis.   Calculi: None.   Focal Lesions: None.     Aorta: Non-aneurysmal.     Inferior Vena Cava: Patent.     Bladder: Foley catheter in place.  Jets: Bilateral ureteral jets are documented.   Prevoid Volume: Not measured.    Postvoid Residual Volume: Not measured.     Prostate: Enlarged measuring 25 cc..     Miscellaneous: None.       Impression   Enlarged prostate.   Bilateral mild hydronephrosis        IMPRESSION: 85 y.o. male with neurogenic bladder with chronic urinary retention following meningitis in 2015, currently with chronic SP tube placement.  History of elevated PSA with negative TRUS biopsy x2    PLAN:  SP care/exchange in home by patients daughter and lifetime nursing care.   Continues with SP tube changes every 3 weeks and as needed flushing for obstruction  Continue Renacidin installations daily.   Encouraged adequate water intake.  PSA testing no longer required based on his age and health status.   Updated renal US shows normal findings    We will continue with yearly evaluations, they will call sooner with questions/concerns.     Thank you for allowing to participate in your patient care. I will keep you informed regarding his future follow-up.      Sincerely,    Vena Rua, [place]    7/20/202212:19 PM

## 2020-09-20 ENCOUNTER — Other Ambulatory Visit: Payer: Self-pay | Admitting: Urology

## 2020-10-23 ENCOUNTER — Telehealth: Payer: Self-pay | Admitting: Urology

## 2020-10-23 NOTE — Telephone Encounter (Signed)
Copied from [place] [phone]. Topic: Access to Care - Speak to Provider/Office Staff  >> Oct 23, 2020 12:48 PM Lois Huxley wrote:  Cristi Loron, calling on behalf of Riley [name].  States mailed information regarding a plan of care for this patient and is asking if it  has been signed off on yet.  Verdis Frederickson can be reached at [phone] to discuss.

## 2020-10-23 NOTE — Telephone Encounter (Signed)
Verdis Frederickson notified forms have been mailed. Verbalized understanding.

## 2020-11-27 ENCOUNTER — Encounter: Payer: Self-pay | Admitting: Urology

## 2020-11-27 ENCOUNTER — Telehealth: Payer: Self-pay | Admitting: Urology

## 2020-11-27 NOTE — Telephone Encounter (Signed)
Patients daughter warm transferred to writer in office  Sounds like there is some excess tissue that was not there previously around the outside of the [facility] site  Cath changes are normal, no issue  Urine output OK  Advised sooner appointment (nothing urgent as it is not bothering him)  [place], please schedule at next avail  Patient/daughter will call with any concerns in the meantime, symptoms worsen, etc    Thanks Lovena Le

## 2020-11-27 NOTE — Telephone Encounter (Signed)
Scheduled [facility]UV for 02/21/21 at 1:45 PM at [facility] with Joneen Roach PA. Sent letter to patient to notify of appointment. Provided call back for rescheduling if needed.

## 2020-12-09 ENCOUNTER — Telehealth: Payer: Self-pay | Admitting: Audiology

## 2020-12-09 NOTE — Telephone Encounter (Signed)
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

## 2020-12-09 NOTE — Telephone Encounter (Signed)
Called back processor is intermittent, it will raise and lower the volume throughout the day without any specific pattern.  She will initiate an upgrade to N8 through [facility]

## 2020-12-25 ENCOUNTER — Telehealth: Payer: Self-pay | Admitting: Urology

## 2020-12-25 NOTE — Telephone Encounter (Signed)
Copied from [place] #[id number]. Topic: Information Request - Other  >> Dec 25, 2020  9:17 AM Jory Sims wrote:  Verdis Frederickson from [facility] called and stated that the Forms were not received and request to have them Faxed.         Please Fax [facility] a [facility] Recertification and Plan of Care Form to: [phone] ATTN: [place] is: [phone]        Patient Telephone Information:  Mobile          [phone]

## 2020-12-25 NOTE — Telephone Encounter (Signed)
Papers mailed. Maria notified.

## 2021-01-15 ENCOUNTER — Other Ambulatory Visit: Payer: Self-pay | Admitting: Urology

## 2021-01-28 ENCOUNTER — Telehealth: Payer: Self-pay | Admitting: Urology

## 2021-01-28 NOTE — Telephone Encounter (Signed)
Copied from [place] #[id number]. Topic: Access to Care - Speak to Provider/Office Staff  >> Jan 28, 2021  3:26 PM Jeanette Caprice wrote:  [facility] is calling to advise the patient has been re certified for home care. She states orders  will be faxed to [place] office [phone]. [facility] can be reached at [phone]

## 2021-01-29 NOTE — Telephone Encounter (Signed)
Message left with Anderson Malta that the fax was not received and to please refax.

## 2021-01-30 NOTE — Telephone Encounter (Signed)
Spoke with Anderson Malta and she stated she didn't think we would get it yet.

## 2021-02-21 ENCOUNTER — Ambulatory Visit: Payer: Medicare (Managed Care) | Admitting: Urology

## 2021-02-25 ENCOUNTER — Telehealth: Payer: Self-pay | Admitting: Urology

## 2021-02-25 NOTE — Telephone Encounter (Signed)
Copied from [place] #[id number]. Topic: Access to Care - Speak to Provider/Office Staff  >> Feb 25, 2021 12:55 PM Penni Bombard wrote:  Riley [name] is requesting a call back at [phone]. She would like to confirm that home care orders have been signed

## 2021-02-26 ENCOUNTER — Encounter: Payer: Self-pay | Admitting: Gastroenterology

## 2021-02-26 NOTE — Telephone Encounter (Signed)
Orders faxed

## 2021-03-27 ENCOUNTER — Other Ambulatory Visit: Payer: Self-pay | Admitting: Urology

## 2021-03-27 NOTE — Telephone Encounter (Signed)
Pended with instructions, please sign

## 2021-03-28 MED ORDER — RENACIDIN IR SOLN *I*
30.0000 mL | Freq: Every day | 6 refills | Status: DC
Start: 2021-03-28 — End: 2021-12-03

## 2021-05-31 ENCOUNTER — Other Ambulatory Visit: Payer: Self-pay

## 2021-05-31 ENCOUNTER — Encounter: Payer: Self-pay | Admitting: Emergency Medicine

## 2021-05-31 ENCOUNTER — Emergency Department
Admission: EM | Admit: 2021-05-31 | Discharge: 2021-05-31 | Disposition: A | Discharge status: Home / Self Care | Payer: Medicare (Managed Care) | Source: Ambulatory Visit | Attending: Student in an Organized Health Care Education/Training Program | Admitting: Student in an Organized Health Care Education/Training Program

## 2021-05-31 DIAGNOSIS — R4689 Other symptoms and signs involving appearance and behavior: Secondary | ICD-10-CM

## 2021-05-31 DIAGNOSIS — X58XXXA Exposure to other specified factors, initial encounter: Secondary | ICD-10-CM | POA: Insufficient documentation

## 2021-05-31 DIAGNOSIS — Y998 Other external cause status: Secondary | ICD-10-CM | POA: Insufficient documentation

## 2021-05-31 DIAGNOSIS — Y9289 Other specified places as the place of occurrence of the external cause: Secondary | ICD-10-CM | POA: Insufficient documentation

## 2021-05-31 DIAGNOSIS — Z87891 Personal history of nicotine dependence: Secondary | ICD-10-CM | POA: Insufficient documentation

## 2021-05-31 DIAGNOSIS — R4702 Dysphasia: Secondary | ICD-10-CM | POA: Insufficient documentation

## 2021-05-31 DIAGNOSIS — T18128A Food in esophagus causing other injury, initial encounter: Secondary | ICD-10-CM

## 2021-05-31 DIAGNOSIS — Y9389 Activity, other specified: Secondary | ICD-10-CM | POA: Insufficient documentation

## 2021-05-31 NOTE — ED Provider Notes (Addendum)
History     Chief Complaint   Patient presents with   ? Dysphagia     HPI    Riley [name] is a 86 y.o. male who is legally blind, PMH BPH, COPD, HLD, suprapubic catheter due to neurogenic bladder, cochlear implant presenting today after experiencing a choking sensation and feeling a food bolus stuck in his throat while eating chicken at dinner.    Patient states at the time he is having difficulty swallowing, followed by difficulty tolerating secretions.  He ultimately was able to swallow while in route to the hospital and his symptoms have resolved at this time.  He endorses a similar episode about 2 months ago and states he has had ongoing dysphagia at baseline for the last 2 months.    Patient denies any dizziness, or truncal ataxia.  He further denies any worsening vision changes, fevers, chest pain, abdominal pain, leg edema, throat swelling, speech changes, or other symptoms.      Medical/Surgical/Family History     Past Medical History:   Diagnosis Date   ? Asthma    ? Cerebral artery occlusion with cerebral infarction    ? COPD (chronic obstructive pulmonary disease)    ? COPD (chronic obstructive pulmonary disease)    ? Hyperlipidemia    ? Hypertension    ? Meningitis    ? Testicle cancer         Patient Active Problem List   Diagnosis Code   ? Benign Prostatic Hyperplasia 600   ? BPH (benign prostatic hyperplasia) N40.0   ? Elevated PSA R97.20   ? Melanoma C43.9   ? Neurogenic bladder N31.9            Past Surgical History:   Procedure Laterality Date   ? APPENDECTOMY     ? COCHLEAR IMPLANT      left ear   ? EYE SURGERY     ? PROSTATE SURGERY     ? superpubic cath     ? TESTICLE REMOVAL     ? TONSILLECTOMY AND ADENOIDECTOMY       Family History   Problem Relation Age of Onset   ? Heart Disease Father           Social History     Tobacco Use   ? Smoking status: Former     Packs/day: 1.00     Years: 10.00     Pack years: 10.00     Types: Cigarettes     Quit date: 11/17/1965     Years since quitting:  55.5   ? Smokeless tobacco: Never   Substance Use Topics   ? Alcohol use: Never     Alcohol/week: 0.0 standard drinks of alcohol   ? Drug use: Never     Living Situation     Questions Responses    Patient lives with     Homeless     Caregiver for other family member     External Services     Employment     Domestic Violence Risk                 Review of Systems   Review of Systems    Physical Exam     Triage Vitals  Triage Start: Start, (05/31/21 1940)  First Recorded BP: (!) 144/98, Resp: 18, Temp: 36.4 ?C (97.5 ?F) Oxygen Therapy SpO2: 100 %, O2 Device: None (Room air), Heart Rate: 67, (05/31/21 1942) Heart Rate (via Pulse Ox): 69, (05/31/21 2003).  First Pain Reported  0-10 Scale: 0, (05/31/21 1942)       Physical Exam  Vitals and nursing note reviewed.   Constitutional:       General: He is not in acute distress.     Comments: Chronically ill-appearing   HENT:      Head: Normocephalic and atraumatic.   Eyes:      Extraocular Movements: Extraocular movements intact.      Comments: No gaze deviation   Cardiovascular:      Rate and Rhythm: Normal rate and regular rhythm.      Pulses: Normal pulses.      Heart sounds: Normal heart sounds. No murmur heard.  Pulmonary:      Effort: No respiratory distress.      Breath sounds: No stridor. No wheezing.   Abdominal:      General: There is no distension.      Tenderness: There is no abdominal tenderness. There is no right CVA tenderness, left CVA tenderness or guarding.   Musculoskeletal:         General: No swelling, tenderness or signs of injury.   Skin:     Coloration: Skin is not jaundiced or pale.      Findings: No bruising or erythema.   Neurological:      Mental Status: He is oriented to person, place, and time. Mental status is at baseline.      Cranial Nerves: No cranial nerve deficit.      Sensory: No sensory deficit.      Gait: Gait normal.      Comments: At baseline, no sensory deficits, moves all extremities spontaneously, no facial droop, dysarthria, or  aphasia.   Psychiatric:         Mood and Affect: Mood normal.         Medical Decision Making   Patient seen by me on:  05/31/2021    Assessment:  Riley [name] is a 86 y.o. male ho is legally blind, PMH BPH, COPD, HLD, suprapubic catheter due to neurogenic bladder, cochlear implant presenting today after experiencing a choking sensation and feeling a food bolus stuck in his throat while eating chicken at dinner.  Symptoms resolved prior to arrival and he is currently at his baseline.  Chronically ill-appearing but in no acute distress, his vital signs and physical exam are reassuring.  We will attempt a bedside swallow test.  Dysphagia is chronic over the last 2 months and his symptoms do not seem consistent with an acute posterior circulation stroke at this time.  Degenerative disease or progressive deconditioning is a consideration and may require further outpatient work-up.  Esophageal dysphagia such as Schatzki's ring also considered.      Differential diagnosis:  As above    Plan:  -Swallow test  Orders Placed This Encounter      ED/UC REFERRAL TO GASTROENTEROLOGY        ED Course and Disposition:  In the ED, patient continued to baseline and passed the bedside swallow test without difficulty.  He is able to swallow and tolerate p.o. and has no voice changes or difficulty tolerating secretions.  Patient given ED referral to GI for further evaluation of possible esophageal dysphagia.  Patient symptoms may also be due to to chronic degenerative disease.  Return precautions discussed patient was discharged home in good condition.              Lajean Saver, DO      Resident Attestation:  Patient seen by me on 05/31/2021.    I saw and evaluated the patient. I agree with the resident's/fellow's findings and plan of care as documented above.    Author:  Luz Brazen, MD          Lajean Saver, DO  Resident  06/01/21 1610       Luz Brazen, MD  06/01/21 [phone]

## 2021-05-31 NOTE — Discharge Instructions (Signed)
Today you were seen in the emergency department for evaluation of difficulty swallowing.  You are being discharged at this time as the blockage seems to have passed on its own.  It is important that you follow-up with GI number to undergo evaluation for potential endoscopy as you have had multiple episodes of impacted food boluses.  Please return if you have any further issues.  We recommend sticking to soft, easy to chew foods until you are evaluated by at least your primary care provider if not also gastroenterology.

## 2021-05-31 NOTE — Comprehensive Assessment (Addendum)
Adult Social Work Initial Assessment      Demographics:  Religious Beliefs: None  [place] of Residence: [place]  Marital status: Widowed  Ethnicity/Race: Caucasian  Primary Language: English  Primary Care Taker of?: No one    Risk Factors:  Risk Factors: Adjustment to Dx/Injury/Illness, Age related issues    Advance Directive:  *Has patient (or family) completed any of the following? (select all that apply): None completed  *Would they like to discuss any issues related to MOLST, DNR Order, HCP, Living Will, or POA?: No  *Health Care Directive teaching done: No    Personal Contacts/Support System:  Cleda Clarks (DAU)   [phone] Rexene Edison)    Geanie Logan (SIL)   [phone]    Professional Contacts/Support System:  Saul Fordyce, MD  [phone]    Living Situation:  Lives With: Family  Can they assist patient after discharge?: Yes (Adult Daughter and Son-in-law)  Primary Care Taker of?: No one     Patient also has an aide that comes to the home 3 days per week to assist with ADLs.     Home Geography:  Type of Home: 2 Story home  # Of Steps In Home: 0  # Steps to Enter Home: 0 (ramp)  Bedroom: First floor  Bathroom: First floor - full  Utilitites Working: Yes    Psychosocial:  Person assessed: Patient, Family (Son in law Geanie Logan [phone] at bedside)  Coping Status: Appropriate to Stressor  Current Goal of Care: Curative  Anticipatory Grief / Bereavement Risk: Normal risk    Alcohol Assessment:  > 0 ETOH drinks in past month: No    Substance Abuse (Not including alcohol):  Other Substance Abuse: No  No    Baseline ADL functioning:  Transfers: One-assist  Ambulation: With assistance  Assistive Device: Walker, Wheelchair  Bathing/Grooming: With assistance  Meal Prep: With assistance  Able to feed self?: Yes  Household maintenance/chores: With assistance  Able to drive?: No    Dialysis:  Does Patient have Dialysis?: No    Income Information:  Vocational: Retired  Income Situation: Government social research officer  Information: Community education officer, [place]  Prescription Coverage: and has  Pharmacy Used: [facility] in [place] military: No  Is patient OPWDD connected? : No  Is the patient presumed eligible for OPWDD services?: No    Home Care Services:  Do you currently have home care services?: No  Current home equipment available: Environmental consultant, Grab bars, Shower chair, Wheelchair     Home Oxygen:  Do you have home oxygen?: No     Damita Lack, LMSW   Emergency Department Social Work   9:08 PM on 05/31/2021

## 2021-05-31 NOTE — ED Notes (Signed)
Pt able to swallow saliva at this time

## 2021-05-31 NOTE — ED Triage Notes (Signed)
Patient believes he has an esophageal occlusion. Cannot maintain secretions. Throat does not appear swollen. Patient has difficulty swallowing at baseline. Patient is able to maintain airway. Arrives with foley. Hard of hearing. Cannot walk, uses wheelchair at baseline.     Prehospital medications given: No

## 2021-06-13 ENCOUNTER — Encounter: Payer: Self-pay | Admitting: Gastroenterology

## 2021-08-22 ENCOUNTER — Ambulatory Visit: Payer: Medicare (Managed Care) | Admitting: Urology

## 2021-08-22 ENCOUNTER — Encounter: Payer: Self-pay | Admitting: Urology

## 2021-08-22 ENCOUNTER — Ambulatory Visit: Payer: Medicare (Managed Care) | Attending: Gastroenterology | Admitting: Gastroenterology

## 2021-08-22 ENCOUNTER — Other Ambulatory Visit: Payer: Self-pay

## 2021-08-22 ENCOUNTER — Encounter: Payer: Self-pay | Admitting: Gastroenterology

## 2021-08-22 VITALS — BP 108/61 | HR 66 | Temp 98.1°F | Resp 16 | Ht 68.0 in | Wt 200.0 lb

## 2021-08-22 VITALS — Ht 68.0 in | Wt 200.0 lb

## 2021-08-22 DIAGNOSIS — Z9359 Other cystostomy status: Secondary | ICD-10-CM

## 2021-08-22 DIAGNOSIS — N319 Neuromuscular dysfunction of bladder, unspecified: Secondary | ICD-10-CM

## 2021-08-22 DIAGNOSIS — R1310 Dysphagia, unspecified: Secondary | ICD-10-CM | POA: Insufficient documentation

## 2021-08-22 NOTE — Progress Notes (Signed)
[facility]  [address] [place], [address]  [facility]  [place] [address]  Phone: [phone]  Fax: [phone]       Gastroenterology Consult  Patient ID: Riley [name] is a 86 y.o. male.  Saul Fordyce, MD   Chief Complaint   Patient presents with   . Dysphagia     HPI    Subjective:    Chart reviewed. History obtained from patient and chart review    Riley [name] is 86 y.o. year old male with hx over past 3-27mos of solid dysphagia. Had one spell of transient impaction having cleared with ambulance ride to the ED. Now better in chewing and cutting food up more to avoid issues. No heartburn or reflux, wt loss, liq dysphagia but reports inc belching with carbonated beverages. Also admits to inc dry cough and inc phlegm.     Medications:   Home Medications:    Current Outpatient Medications:   .  omeprazole (PRILOSEC) 20 mg capsule, Take 1 capsule (20 mg total) by mouth daily (before breakfast), Disp: , Rfl:   .  Citric Ac-Gluconolact-Mg Carb (RENACIDIN), 30 mLs by Intracatheter route daily  Irrigate with 30 ml once daily Monday, Wednesday, Friday, Saturday and twice daily Tuesday, Thursday, Sunday, Disp: 900 mL, Rfl: 6  .  furosemide (LASIX) 40 mg tablet, Take 1 tablet (40 mg total) by mouth every morning, Disp: , Rfl:   .  calcium carbonate 250 mg/mL suspension, Take 5 mLs (1,250 mg total) by mouth 2 times daily (with meals), Disp: , Rfl:   .  magnesium gluconate (MAGONATE) 500 MG tablet, Take 1 tablet (500 mg total) by mouth 2 times daily, Disp: , Rfl:   .  Lactobacillus (ACIDOPHILUS) 100 MG CAPS, Take 100 capsules by mouth daily, Disp: , Rfl:   .  amLODIPine (NORVASC) 5 MG tablet, Take 1 tablet (5 mg total) by mouth daily, Disp: , Rfl:   .  citalopram (CELEXA) 20 MG tablet, Take 1 tablet (20 mg total) by mouth daily, Disp: , Rfl:   .  Melatonin 10 MG CAPS, Take 10 mg by mouth daily, Disp: , Rfl:   .  traZODone (DESYREL) 50 MG tablet, Take 1 tablet (50 mg total) by mouth nightly,  Disp: , Rfl:   .  atorvastatin (LIPITOR) 10 MG tablet, , Disp: , Rfl:   .  triamcinolone (KENALOG) 0.1 % cream, Apply topically daily (Patient not taking: Reported on 08/22/2021), Disp: , Rfl:     PMH:     Past Medical History:   Diagnosis Date   . Asthma    . Cerebral artery occlusion with cerebral infarction    . COPD (chronic obstructive pulmonary disease)    . COPD (chronic obstructive pulmonary disease)    . Hyperlipidemia    . Hypertension    . Meningitis    . Testicle cancer       Past Surgical History:   Procedure Laterality Date   . APPENDECTOMY     . COCHLEAR IMPLANT      left ear   . EYE SURGERY     . PROSTATE SURGERY     . superpubic cath     . TESTICLE REMOVAL     . TONSILLECTOMY AND ADENOIDECTOMY       Family history:     Family History   Problem Relation Age of Onset   . Heart Disease Father      Social history:  Social History     Tobacco Use   . Smoking status: Former     Packs/day: 1.00     Years: 10.00     Pack years: 10.00     Types: Cigarettes     Quit date: 11/17/1965     Years since quitting: 55.8   . Smokeless tobacco: Never   Vaping Use   . Vaping status: Not on file   Substance Use Topics   . Alcohol use: Yes     Comment: 1-2 weekly     Allergies:     Allergies   Allergen Reactions   . Peanut-Derived      Throat swells     . No Known Drug Allergy      Created by Conversion - 0;      ROS:   As per HPI    Physical Exam:   BP 108/61 (BP Location: Left arm)   Pulse 66   Temp 36.7 C (98.1 F) (Infrared)   Resp 16   Ht 1.727 m (5\' 8" )   Wt 90.7 kg (200 lb)   SpO2 96%   BMI 30.41 kg/m   General appearance: alert, appears stated age and cooperative  Head: Normocephalic, without obvious abnormality, atraumatic, teeth ok, hearing aids noted as hoh.  Eyes: conjunctivae/corneas clear.   Neck: supple, symmetrical, trachea midline, contracted to right  Abdomen: soft, non-tender; bowel sounds normal; no masses,  no organomegaly  Extremities: extremities normal, atraumatic, no cyanosis or  edema  Neurologic: Grossly normal    LAB RESULTS:   No results found for this or any previous visit (from the past 336 hour(s)).        IMAGING:   No results found.     ASSESSMENT:   86 y.o. year old male with new onset solid food dysphagia and notable lack of preexisting gerd. Differential discussed.     PLAN:    will proceed with egd and possible dilatation .  Risks reviewed.     Author: Richard Miu, MD.     Note created: 08/22/2021  at: 3:21 PM   Phone # [phone]

## 2021-08-22 NOTE — Progress Notes (Signed)
Dear Dr. Saul Fordyce, MD,     Thank you for referring Riley [name] to me for consultation for his neurogenic bladder. I saw Riley [name] on 08/22/2021.     HPI:  86 y.o. yo male referred to our office for neurogenic bladder SP tube care.   PMH significant for COPD, asthma, testicular cancer s/p left orchiectomy, prediabetes, HTN, HLD.  Previously evaluated for neurogenic bladder following meningitis in 2015, currently managed via suprapubic catheter x6 years  Patient lives with daughter, he is legally deaf and legally blind. His daughter Riley [name] has supplemented much of the history. She is his primary care taker. Patient previously lived full time in [place] and has since moved to the area.     Interim history:  Patient presents for 1 year follow-up  Continues with chronic SP tube management, exchanged by his daughter and by [facility] nurses in home.   Cath changes occur every 3 weeks without issue.  Reports good urine output, clear color   Very minimal heme noted on occasion which clears up within a few hours   Notes moderate sediment concerns which they continue use of renacindin daily  Denies any recent infection concerns  Denies any fever, chills, abdominal/flank pain, gross hematuria    Prior renal ultrasound shows normal findings with prior resolution of reported hydronephrosis    Patient reports prior history of elevated PSA with with NEG TRUS bx x2, 10-15 years ago.   No longer requires PSA testing     Non smoker  No family history of prostate, bladder, kidney, testicular cancers.   Allergies   Allergen Reactions   . Peanut-Derived      Throat swells     . No Known Drug Allergy      Created by Conversion - 0;        Past Medical History:   Diagnosis Date   . Asthma    . Cerebral artery occlusion with cerebral infarction    . COPD (chronic obstructive pulmonary disease)    . COPD (chronic obstructive pulmonary disease)    . Hyperlipidemia    . Hypertension    . Meningitis    . Testicle  cancer        Past Surgical History:   Procedure Laterality Date   . APPENDECTOMY     . COCHLEAR IMPLANT      left ear   . EYE SURGERY     . PROSTATE SURGERY     . superpubic cath     . TESTICLE REMOVAL     . TONSILLECTOMY AND ADENOIDECTOMY         Current Outpatient Medications   Medication Sig   . omeprazole (PRILOSEC) 20 mg capsule Take 1 capsule (20 mg total) by mouth daily (before breakfast)   . Citric Ac-Gluconolact-Mg Carb (RENACIDIN) 30 mLs by Intracatheter route daily  Irrigate with 30 ml once daily Monday, Wednesday, Friday, Saturday and twice daily Tuesday, Thursday, Sunday   . furosemide (LASIX) 40 mg tablet Take 1 tablet (40 mg total) by mouth every morning   . calcium carbonate 250 mg/mL suspension Take 5 mLs (1,250 mg total) by mouth 2 times daily (with meals)   . magnesium gluconate (MAGONATE) 500 MG tablet Take 1 tablet (500 mg total) by mouth 2 times daily   . Lactobacillus (ACIDOPHILUS) 100 MG CAPS Take 100 capsules by mouth daily   . amLODIPine (NORVASC) 5 MG tablet Take 1 tablet (5 mg total) by mouth daily   .  citalopram (CELEXA) 20 MG tablet Take 1 tablet (20 mg total) by mouth daily   . Melatonin 10 MG CAPS Take 10 mg by mouth daily   . triamcinolone (KENALOG) 0.1 % cream Apply topically daily   . traZODone (DESYREL) 50 MG tablet Take 1 tablet (50 mg total) by mouth nightly   . atorvastatin (LIPITOR) 10 MG tablet        Family History   Problem Relation Age of Onset   . Heart Disease Father        Social History     Socioeconomic History   . Marital status: Married   Tobacco Use   . Smoking status: Former     Packs/day: 1.00     Years: 10.00     Pack years: 10.00     Types: Cigarettes     Quit date: 11/17/1965     Years since quitting: 55.8   . Smokeless tobacco: Never   Substance and Sexual Activity   . Alcohol use: Never     Alcohol/week: 0.0 standard drinks of alcohol   . Drug use: Never   . Sexual activity: Not Currently       Physical Exam:  Vitals:    08/22/21 1046   Weight: 90.7 kg (200  lb)   Height: 1.727 m (5\' 8" )     GENERAL:  Deaf/leagally blind. No acute distress, well developed, well nourished. Wheelchair bound, unable to ambulate.    NEUROLOGIC:  Oriented to person, place, time, and situation.  PSYCHIATRIC:  Normal mood and affect.  HEENT:  Normocephalic, atraumatic.  SKIN:  Normal color, turgor, texture, hydration.  RESPIRATORY:  Respirations unlabored.    BACK/ORTHO:  No costovertebral angle tenderness.  No tenderness of the axial skeleton.  No obvious back deformities.  ABDOMINAL:  Abdomen soft,nontender, nondistended, and without masses.  EXTREMITIES:  Without cyanosis, clubbing, or edema. Calves nontender.  GENITOURINARY: deferred patient has difficulty standing.     Labs:  No results found for this or any previous visit (from the past 24 hour(s)).    Lab Results   Component Value Date    PSAR3 2.81 10/31/2010    PSAR3 3.01 10/24/2009        Imaging:  3//2022-Renal US:  FINDINGS:   Right Kidney: Measures 11.2 cm.   Parenchyma: Normal homogeneous echotexture.   Hydronephrosis: None.   Calculi: None.   Focal Lesions: None.     Left Kidney: Measures 10.0 cm.   Parenchyma: Normal homogeneous echotexture.   Hydronephrosis: None.   Calculi: None.   Focal Lesions: None.     Aorta: Non-aneurysmal.     Inferior Vena Cava: Patent.     Bladder: Foley catheter visualized.   Jets: Bilateral ureteral jets are documented.   Prevoid Volume: Not measured.   Postvoid Residual Volume: Not measured.     Prostate: Not visualized.     Miscellaneous: None.       Impression   Normal renal ultrasound.        08/15/2018-Renal US:  FINDINGS:   Right Kidney: Measures 10.8 cm.   Parenchyma: Normal homogeneous echotexture.   Hydronephrosis: Mild hydronephrosis.   Calculi: None.   Focal Lesions: None.     Left Kidney: Measures 13.6 cm.   Parenchyma: Normal homogeneous echotexture.   Hydronephrosis: Mild hydronephrosis.   Calculi: None.   Focal Lesions: None.     Aorta: Non-aneurysmal.      Inferior Vena Cava: Patent.     Bladder: Foley catheter in place.  Jets: Bilateral ureteral jets are documented.   Prevoid Volume: Not measured.   Postvoid Residual Volume: Not measured.     Prostate: Enlarged measuring 25 cc..     Miscellaneous: None.       Impression   Enlarged prostate.   Bilateral mild hydronephrosis        IMPRESSION: 86 y.o. male with neurogenic bladder with chronic urinary retention following meningitis in 2015, currently with chronic SP tube placement.  History of elevated PSA with negative TRUS biopsy x2    PLAN:  SP care/exchange in home by patients daughter and lifetime nursing care.   Continues with SP tube changes every 3 weeks and as needed flushing for obstruction  Continue Renacidin installations daily.   Encouraged adequate water intake.  PSA testing no longer required based on his age and health status.   Prior renal US shows normal findings    We will continue with yearly evaluations, they will call sooner with questions/concerns.     Thank you for allowing to participate in your patient care. I will keep you informed regarding his future follow-up.      Sincerely,    Jolene Provost, [place]    7/21/202311:20 AM

## 2021-08-22 NOTE — Patient Instructions (Signed)
Procedure  The surgical scheduler will call you to set up your procedure(s) or you can reach her at [phone].  Please fill-in the date and location below:    Your procedure is scheduled on ______________ (date)     with Dr. Howard Merzel in ______________ (location - [place] or [place] only).    1 to 2 weeks prior, a team member from the [facility] will call you for a phone pre-op interview.  Between 1pm and 3pm the day before your procedure, the surgical center will call again and give you your time of arrival.    If you have any change in your health status, including, but not limited to: fever, cough, shortness of breath, chest pain, new medications, or a recent visit to the ER or Urgent Care, please reach out to the Surgical Center;  [facility] at [facility] ([place]): [phone]  [facility] ([place]): [phone]    [facility] GI Endoscopy (Gastroscopy) Preparation Instructions    Exam Preparation:     1). NOTHING SOLID TO EAT OR DRINK AFTER MIDNIGHT for AM procedures(prior to 12:00 pm). IF you have a PM procedure (after 12:00 pm), you may have clear liquids up to 6 hours prior to your exam. Clear liquids include: soda, sports drinks, juice, Jell-O, tea, black coffee (sugar is allowed), broth, hard candy and popsicles.   2).  If you take Brilinta (ticagrelor) please hold for 5 days. If you take Coumadin (warfarin) or Plavix (clopidogrel) please hold for 3 days. If you take any other blood thinner such as Eliquis (apixaban), Xarelto (rivaroxaban) or Pradaxa (dabigatran) please hold for 2 days. It is your responsibility to contact the prescribing doctor to ensure they are agreeable with the anticoagulation holds as outlined above.    3). Please TAKE your morning medication unless otherwise instructed.  4). You will be at the facility for approximately 2 hours. YOU MUST HAVE A DRIVER TO BRING YOU HOME FROM YOUR PROCEDURE.  You will not be allowed to take a taxi or    bus home.  5). You can NOT return to work, drive or operate machinery until the following day.        There may be a co-payment for this procedure. If you have any questions regarding your appointment, please contact Dr. Merzel's Office at [phone].

## 2021-08-26 ENCOUNTER — Telehealth: Payer: Self-pay | Admitting: Urology

## 2021-08-26 NOTE — Telephone Encounter (Signed)
Copied from CRM [id number]. Topic: Access to Care - Speak to Provider/Office Staff  >> Aug 26, 2021  9:54 AM Dennie Bible wrote:  Byrd Hesselbach, [facility], is calling to inquire if the plan of care orders mailed 08/01/21 to office and faxed to [phone].  She states this order is due 08/27/21 Fax number [phone] or [phone].  Writer called [place] office spoke to Cl[place];  she states it was not received advised to have caller resend the orders.      [facility] Care can be reached at [phone].

## 2021-08-26 NOTE — Telephone Encounter (Signed)
Orders refaxed.

## 2021-10-01 ENCOUNTER — Emergency Department: Payer: Medicare (Managed Care)

## 2021-10-01 ENCOUNTER — Emergency Department: Payer: Medicare (Managed Care) | Admitting: Radiology

## 2021-10-01 ENCOUNTER — Other Ambulatory Visit: Payer: Self-pay

## 2021-10-01 ENCOUNTER — Emergency Department
Admission: EM | Admit: 2021-10-01 | Discharge: 2021-10-01 | Disposition: A | Discharge status: Home / Self Care | Payer: Medicare (Managed Care) | Source: Ambulatory Visit | Attending: Emergency Medicine | Admitting: Emergency Medicine

## 2021-10-01 DIAGNOSIS — X58XXXA Exposure to other specified factors, initial encounter: Secondary | ICD-10-CM | POA: Insufficient documentation

## 2021-10-01 DIAGNOSIS — S91002A Unspecified open wound, left ankle, initial encounter: Secondary | ICD-10-CM | POA: Insufficient documentation

## 2021-10-01 DIAGNOSIS — Y998 Other external cause status: Secondary | ICD-10-CM | POA: Insufficient documentation

## 2021-10-01 DIAGNOSIS — Y9389 Activity, other specified: Secondary | ICD-10-CM | POA: Insufficient documentation

## 2021-10-01 DIAGNOSIS — R059 Cough, unspecified: Secondary | ICD-10-CM

## 2021-10-01 DIAGNOSIS — Y9289 Other specified places as the place of occurrence of the external cause: Secondary | ICD-10-CM | POA: Insufficient documentation

## 2021-10-01 DIAGNOSIS — L089 Local infection of the skin and subcutaneous tissue, unspecified: Secondary | ICD-10-CM | POA: Insufficient documentation

## 2021-10-01 DIAGNOSIS — E871 Hypo-osmolality and hyponatremia: Secondary | ICD-10-CM | POA: Insufficient documentation

## 2021-10-01 DIAGNOSIS — K7689 Other specified diseases of liver: Secondary | ICD-10-CM

## 2021-10-01 DIAGNOSIS — R509 Fever, unspecified: Secondary | ICD-10-CM

## 2021-10-01 DIAGNOSIS — R7401 Elevation of levels of liver transaminase levels: Secondary | ICD-10-CM

## 2021-10-01 LAB — URINALYSIS REFLEX TO CULTURE
Glucose,UA: NEGATIVE
Ketones, UA: NEGATIVE
Nitrite,UA: NEGATIVE
Specific Gravity,UA: 1.01 (ref 1.002–1.030)
pH,UA: 6 (ref 5.0–8.0)

## 2021-10-01 LAB — COMPREHENSIVE METABOLIC PANEL, PL
ALT, PL: 97 U/L — ABNORMAL HIGH (ref 0–50)
AST, PL: 114 U/L — ABNORMAL HIGH (ref 0–50)
Albumin, PL: 4 g/dL (ref 3.5–5.2)
Alk Phos, PL: 79 U/L (ref 40–130)
Anion Gap,PL: 12 (ref 7–16)
Bilirubin Total, PL: 0.6 mg/dL (ref 0.0–1.2)
CO2,Plasma: 22 mmol/L (ref 20–28)
Calcium, PL: 8.7 mg/dL (ref 8.6–10.2)
Chloride,Plasma: 94 mmol/L — ABNORMAL LOW (ref 96–108)
Creatinine: 1.68 mg/dL — ABNORMAL HIGH (ref 0.67–1.17)
Glucose,Plasma: 113 mg/dL — ABNORMAL HIGH (ref 60–99)
Potassium,Plasma: 4 mmol/L (ref 3.3–4.6)
Sodium,Plasma: 128 mmol/L — ABNORMAL LOW (ref 133–145)
Total Protein, PL: 6.7 g/dL (ref 6.3–7.7)
UN,Plasma: 22 mg/dL — ABNORMAL HIGH (ref 6–20)
eGFR BY CREAT: 40 * — AB

## 2021-10-01 LAB — URINE MICROSCOPIC (IQ200)

## 2021-10-01 LAB — DIFF MANUAL: Diff Based On: 100 CELLS

## 2021-10-01 LAB — CBC AND DIFFERENTIAL
Baso # K/uL: 0 10*3/uL (ref 0.0–0.1)
Basophil %: 0 %
Eos # K/uL: 0 10*3/uL (ref 0.0–0.5)
Eosinophil %: 0 %
Hematocrit: 37 % — ABNORMAL LOW (ref 40–51)
Hemoglobin: 12.5 g/dL — ABNORMAL LOW (ref 13.7–17.5)
Lymph # K/uL: 0.7 10*3/uL — ABNORMAL LOW (ref 1.3–3.6)
Lymphocyte %: 7 %
MCH: 31 pg (ref 26–32)
MCHC: 34 g/dL (ref 32–37)
MCV: 90 fL (ref 79–92)
Mono # K/uL: 1.4 10*3/uL — ABNORMAL HIGH (ref 0.3–0.8)
Monocyte %: 13 %
Neut # K/uL: 8.4 10*3/uL — ABNORMAL HIGH (ref 1.8–5.4)
Nucl RBC # K/uL: 0 10*3/uL (ref 0.0–0.0)
Nucl RBC %: 0 /100 WBC (ref 0.0–0.2)
Platelets: 175 10*3/uL (ref 150–330)
RBC: 4.1 MIL/uL — ABNORMAL LOW (ref 4.6–6.1)
RDW: 13.4 % (ref 11.6–14.4)
Seg Neut %: 80 %
WBC: 10.5 10*3/uL — ABNORMAL HIGH (ref 4.2–9.1)

## 2021-10-01 LAB — LACTATE, PLASMA: Lactate: 1.2 mmol/L (ref 0.5–2.2)

## 2021-10-01 LAB — HOLD SST

## 2021-10-01 LAB — RBC MORPHOLOGY: RBC Morphology: NORMAL

## 2021-10-01 MED ORDER — SODIUM CHLORIDE 0.9 % IV BOLUS *I*
1000.0000 mL | Freq: Once | Status: AC
Start: 2021-10-01 — End: 2021-10-01
  Administered 2021-10-01: 1000 mL via INTRAVENOUS

## 2021-10-01 NOTE — ED Triage Notes (Signed)
Pt presents via ambulance from home with reports of change in mental status that started last evening around dinner time when he became noticeably altered and somewhat belligerent with family around bedtime last evening. Pt's daughter notes that the pt is current under treatment for a wound on the L ankle that he is being treated with PO ABX for the last 3 days with a total of 4 doses at this time. Pt is also a Suprapubic catheter pt prone to infection from time to time. Daughter notes that the pt did have a fever this AM of 101F for which Tylenol was given at 0930 this AM.       Prehospital medications given: Yes  Analgesia: Tylenol (1 Tablet of 500mg  @ 0930)

## 2021-10-01 NOTE — Discharge Instructions (Signed)
Followup with pcp  Continue home meds  Return to ED with any concerns

## 2021-10-01 NOTE — ED Provider Notes (Signed)
History     Chief Complaint   Patient presents with   . Fever   . Altered Mental Status   . Wound Infection     This is an 86 year old male presents emergency room with complaints of fevers at home.  Patient's daughter states that yesterday he appeared confused but that cleared up well this morning.  He had a fever at home prior to coming emergency room currently afebrile.  Daughter was concerned because the patient has a healing sore on his left ankle was not sure if this was the cause of his possible infection.  Daughter states he had a slight cough.            Medical/Surgical/Family History     Past Medical History:   Diagnosis Date   . Asthma    . Cerebral artery occlusion with cerebral infarction    . COPD (chronic obstructive pulmonary disease)    . COPD (chronic obstructive pulmonary disease)    . Hyperlipidemia    . Hypertension    . Meningitis    . Testicle cancer         Patient Active Problem List   Diagnosis Code   . Benign Prostatic Hyperplasia 600   . BPH (benign prostatic hyperplasia) N40.0   . Elevated PSA R97.20   . Melanoma C43.9   . Neurogenic bladder N31.9   . Dysphagia, unspecified type R13.10            Past Surgical History:   Procedure Laterality Date   . APPENDECTOMY     . COCHLEAR IMPLANT      left ear   . EYE SURGERY     . PROSTATE SURGERY     . superpubic cath     . TESTICLE REMOVAL     . TONSILLECTOMY AND ADENOIDECTOMY       Family History   Problem Relation Age of Onset   . Heart Disease Father           Social History     Tobacco Use   . Smoking status: Former     Packs/day: 1.00     Years: 10.00     Pack years: 10.00     Types: Cigarettes     Quit date: 11/17/1965     Years since quitting: 55.9   . Smokeless tobacco: Never   Substance Use Topics   . Alcohol use: Yes     Comment: 1-2 weekly   . Drug use: Never     Living Situation     Questions Responses    Patient lives with     Homeless     Caregiver for other family member     External Services     Employment     Domestic  Violence Risk                 Review of Systems   Review of Systems   Constitutional: Positive for fever.   Respiratory: Positive for cough.    Skin: Positive for wound.   All other systems reviewed and are negative.      Physical Exam     Triage Vitals  Triage Start: Start, (10/01/21 1125)  First Recorded BP: 122/61, Resp: 18, Temp: 36.6 C (97.9 F), Temp src: TEMPORAL Oxygen Therapy SpO2: 92 %, Oximetry Source: Rt Hand, O2 Device: None (Room air), Heart Rate: 71, (10/01/21 1132) Heart Rate (via Pulse Ox): 71, (10/01/21 1132).  First Pain Reported  0-10 Scale: 0, (  10/01/21 1132)       Physical Exam  HENT:      Head: Normocephalic and atraumatic.      Nose: Nose normal.      Mouth/Throat:      Mouth: Mucous membranes are moist.   Eyes:      Extraocular Movements: Extraocular movements intact.      Conjunctiva/sclera: Conjunctivae normal.      Pupils: Pupils are equal, round, and reactive to light.   Cardiovascular:      Rate and Rhythm: Normal rate and regular rhythm.      Heart sounds: Normal heart sounds.   Pulmonary:      Effort: Pulmonary effort is normal.      Breath sounds: Normal breath sounds.   Abdominal:      General: Bowel sounds are normal.   Musculoskeletal:         General: Normal range of motion.      Cervical back: Normal range of motion.   Skin:     General: Skin is warm.      Comments: Dime size wound medial lt ankle. No erythema, no drainage   Neurological:      General: No focal deficit present.      Mental Status: He is alert and oriented to person, place, and time. Mental status is at baseline.      Cranial Nerves: No cranial nerve deficit.         Medical Decision Making     Assessment:  This is a 86 year old male presents emergency room complaints of fevers feeling unwell    Differential diagnosis:  Infection  Cellulitis  UTI    Plan:  Evaluated patient.  Lab work was done  Leukocytosis at 10,000.  Chest x-ray was unremarkable LFTs were slightly elevated so a ultrasound was completed of the  right upper quadrant.  This showed a normal common bile duct normal gallbladder  Patient is wound in his left inner ankle does not appear to be cellulitic.  He is currently on antibiotics which is Bactrim for this.  Sodium was noted to be 128.  He is supposed to be on saline tablets which his daughter will restart him on today.  We will discharge patient home to follow-up with his primary care doctor  He will be discharged to follow-up with his primary care doctor    Recent Results (from the past 24 hour(s))  -CBC and differential:   Collection Time: 10/01/21 12:27 PM       Result                                            Value                         Ref Range                       WBC                                               10.5 (H)                      4.2 - 9.1 THOU/uL  RBC                                               4.1 (L)                       4.6 - 6.1 MIL/uL                Hemoglobin                                        12.5 (L)                      13.7 - 17.5 g/dL                Hematocrit                                        37 (L)                        40 - 51 %                       MCV                                               90                            79 - 92 fL                      MCH                                               31                            26 - 32 pg                      MCHC                                              34                            32 - 37 g/dL                    RDW  13.4                          11.6 - 14.4 %                   Platelets                                         175                           150 - 330 THOU/uL               Seg Neut %                                        80.0                          %                               Lymphocyte %                                      7.0                           %                               Monocyte %                                         13.0                          %                               Eosinophil %                                      0.0                           %                               Basophil %                                        0.0                           %  Neut # K/uL                                       8.4 (H)                       1.8 - 5.4 THOU/uL               Lymph # K/uL                                      0.7 (L)                       1.3 - 3.6 THOU/uL               Mono # K/uL                                       1.4 (H)                       0.3 - 0.8 THOU/uL               Eos # K/uL                                        0.0                           0.0 - 0.5 THOU/uL               Baso # K/uL                                       0.0                           0.0 - 0.1 THOU/uL               Nucl RBC %                                        0.0                           0.0 - 0.2 /100 WBC              Nucl RBC # K/uL                                   0.0                           0.0 - 0.0 THOU/uL          -Comprehensive Metabolic Panel, PL:   Collection Time: 10/01/21 12:27 PM  Result                                            Value                         Ref Range                       Potassium,Plasma                                  4.0                           3.3 - 4.6 mmol/L                Sodium,Plasma                                     128 (L)                       133 - 145 mmol/L                Anion Gap,PL                                      12                            7 - 16                          UN,Plasma                                         22 (H)                        6 - 20 mg/dL                    Creatinine                                        1.68 (H)                      0.67 - 1.17 mg/dL               eGFR BY CREAT                                     40 (!)                        *  Glucose,Plasma                                    113 (H)                       60 - 99 mg/dL                   Calcium, PL                                       8.7                           8.6 - 10.2 mg/dL                Chloride,Plasma                                   94 (L)                        96 - 108 mmol/L                 CO2,Plasma                                        22                            20 - 28 mmol/L                  Alk Phos, PL                                      79                            40 - 130 U/L                    AST, PL                                           114 (H)                       0 - 50 U/L                      ALT, PL                                           97 (H)                        0 - 50 U/L  Albumin, PL                                       4.0                           3.5 - 5.2 g/dL                  Bilirubin Total, PL                               0.6                           0.0 - 1.2 mg/dL                 Total Protein, PL                                 6.7                           6.3 - 7.7 g/dL             -Lactate, plasma:   Collection Time: 10/01/21 12:27 PM       Result                                            Value                         Ref Range                       Lactate                                           1.2                           0.5 - 2.2 mmol/L           -Diff manual:   Collection Time: 10/01/21 12:27 PM       Result                                            Value                         Ref Range                       Manual DIFF                                       RESULTS  Diff Based On                                     100                           CELLS                      -RBC morphology:   Collection Time: 10/01/21 12:27 PM       Result                                            Value                         Ref Range                        RBC Morphology                                    Normal                                                   -Hold SST:   Collection Time: 10/01/21 12:28 PM       Result                                            Value                         Ref Range                       Hold SST                                          HOLD TUBE                                                -Urinalysis reflex to culture:   Collection Time: 10/01/21  1:28 PM       Result                                            Value                         Ref Range                       Color, UA  AMBER                         Yellow-Dk Yellow                Appearance,UR                                     Cloudy (!)                    Clear                           Specific Gravity,UA                               1.010                         1.002 - 1.030                   Leuk Esterase,UA                                  3+ (!)                        NEGATIVE                        Nitrite,UA                                        NEG                           NEGATIVE                        pH,UA                                             6.0                           5.0 - 8.0                       Protein,UA                                        1+ (!)                        NEGATIVE                        Glucose,UA  NEG                                                           Ketones, UA                                       NEG                           NEGATIVE                        Blood,UA                                          3+ (!)                        NEGATIVE                   -Urine microscopic (iq200):   Collection Time: 10/01/21  1:28 PM       Result                                            Value                         Ref Range                       RBC,UA                                            11-20 (!)                      0 - 2 /hpf                      WBC,UA                                            0-5                           0 - 5 /hpf                      Bacteria,UA                                       1+  None Seen - 1+                  Squam Epithel,UA                                  1+                            0-1+                         *Chest STANDARD single view    Result Date: 10/01/2021  10/01/2021 12:36 PM CHEST X-RAY CLINICAL INFORMATION:  cough fevers. COMPARISON:  None. PROCEDURE: A single frontal projection of the chest was obtained. FINDINGS: Suboptimal exam secondary to portable apparatus, single view, body habitus and the patient's head overlying the apices. Tubes and Catheters: None. Central Airways: Normal. Lungs/Pleura/Pleural space: Grossly clear lungs Heart and Mediastinum: Indeterminate cardiomegaly secondary to suspected kyphosis. Additional Findings: No acute or aggressive osseous changes noted. Prominent thoracic kyphosis is suspected     Suboptimal exam secondary to portable apparatus, single view, body habitus and the patient's head overlying the apices. No gross acute cardiopulmonary disease. Grossly clear lungs. END OF IMPRESSION       [facility] submits this DICOM format image data and final report to the [facility], an independent secure electronic health information exchange, on a reciprocally searchable basis (with patient authorization) for a minimum of 12 months after exam date.        ED Course and Disposition:  Pt viable for discharge  Reviewed home treatments  Reviewed necessary follow up with PCP  Reviewed return precautions, pt verbalized understanding.                 Landis Martins, PA             Johnathan Hausen [place], [place]  10/01/21 1535

## 2021-10-02 ENCOUNTER — Other Ambulatory Visit
Admission: RE | Admit: 2021-10-02 | Discharge: 2021-10-02 | Disposition: A | Discharge status: Home / Self Care | Payer: Medicare (Managed Care) | Source: Ambulatory Visit | Attending: Family Medicine | Admitting: Family Medicine

## 2021-10-02 DIAGNOSIS — R110 Nausea: Secondary | ICD-10-CM | POA: Insufficient documentation

## 2021-10-02 DIAGNOSIS — I959 Hypotension, unspecified: Secondary | ICD-10-CM | POA: Insufficient documentation

## 2021-10-02 LAB — COMPREHENSIVE METABOLIC PANEL
ALT: 196 U/L — ABNORMAL HIGH (ref 0–50)
AST: 172 U/L — ABNORMAL HIGH (ref 0–50)
Albumin: 4 g/dL (ref 3.5–5.2)
Alk Phos: 166 U/L — ABNORMAL HIGH (ref 40–130)
Anion Gap: 13 (ref 7–16)
Bilirubin,Total: 0.5 mg/dL (ref 0.0–1.2)
CO2: 22 mmol/L (ref 20–28)
Calcium: 8.2 mg/dL — ABNORMAL LOW (ref 8.6–10.2)
Chloride: 95 mmol/L — ABNORMAL LOW (ref 96–108)
Creatinine: 1.64 mg/dL — ABNORMAL HIGH (ref 0.67–1.17)
Glucose: 100 mg/dL — ABNORMAL HIGH (ref 60–99)
Lab: 21 mg/dL — ABNORMAL HIGH (ref 6–20)
Potassium: 4.6 mmol/L (ref 3.3–5.1)
Sodium: 130 mmol/L — ABNORMAL LOW (ref 133–145)
Total Protein: 6.2 g/dL — ABNORMAL LOW (ref 6.3–7.7)
eGFR BY CREAT: 41 * — AB

## 2021-10-02 LAB — CBC AND DIFFERENTIAL
Baso # K/uL: 0 10*3/uL (ref 0.0–0.2)
Basophil %: 0.1 %
Eos # K/uL: 0 10*3/uL (ref 0.0–0.5)
Eosinophil %: 0.4 %
Hematocrit: 37 % (ref 37–52)
Hemoglobin: 12.3 g/dL (ref 12.0–17.0)
IMM Granulocytes #: 0.1 10*3/uL — ABNORMAL HIGH (ref 0.0–0.0)
IMM Granulocytes: 0.9 %
Lymph # K/uL: 0.7 10*3/uL — ABNORMAL LOW (ref 1.0–5.0)
Lymphocyte %: 8.6 %
MCH: 31 pg (ref 26–32)
MCHC: 33 g/dL (ref 32–37)
MCV: 93 fL (ref 75–100)
Mono # K/uL: 1.3 10*3/uL — ABNORMAL HIGH (ref 0.1–1.0)
Monocyte %: 17.6 %
Neut # K/uL: 5.5 10*3/uL (ref 1.5–6.5)
Nucl RBC # K/uL: 0 10*3/uL (ref 0.0–0.0)
Nucl RBC %: 0 /100 WBC (ref 0.0–0.2)
Platelets: 170 10*3/uL (ref 150–450)
RBC: 4 MIL/uL (ref 4.0–6.0)
RDW: 13.6 % (ref 0.0–15.0)
Seg Neut %: 72.4 %
WBC: 7.5 10*3/uL (ref 3.5–11.0)

## 2021-10-03 ENCOUNTER — Other Ambulatory Visit: Payer: Self-pay

## 2021-10-03 ENCOUNTER — Emergency Department: Payer: Medicare (Managed Care)

## 2021-10-03 ENCOUNTER — Emergency Department
Admission: EM | Admit: 2021-10-03 | Discharge: 2021-10-03 | Disposition: A | Discharge status: Home / Self Care | Payer: Medicare (Managed Care) | Source: Ambulatory Visit | Attending: Emergency Medicine | Admitting: Emergency Medicine

## 2021-10-03 ENCOUNTER — Encounter: Payer: Self-pay | Admitting: Emergency Medicine

## 2021-10-03 DIAGNOSIS — R748 Abnormal levels of other serum enzymes: Secondary | ICD-10-CM | POA: Insufficient documentation

## 2021-10-03 DIAGNOSIS — K7460 Unspecified cirrhosis of liver: Secondary | ICD-10-CM

## 2021-10-03 DIAGNOSIS — K449 Diaphragmatic hernia without obstruction or gangrene: Secondary | ICD-10-CM

## 2021-10-03 DIAGNOSIS — N400 Enlarged prostate without lower urinary tract symptoms: Secondary | ICD-10-CM

## 2021-10-03 DIAGNOSIS — R21 Rash and other nonspecific skin eruption: Secondary | ICD-10-CM

## 2021-10-03 DIAGNOSIS — R062 Wheezing: Secondary | ICD-10-CM

## 2021-10-03 LAB — PROTIME-INR
INR: 1 (ref 0.9–1.1)
Protime: 11.6 s (ref 10.0–12.9)

## 2021-10-03 LAB — CBC AND DIFFERENTIAL
Baso # K/uL: 0 10*3/uL (ref 0.0–0.2)
Basophil %: 0.3 %
Eos # K/uL: 0.1 10*3/uL (ref 0.0–0.5)
Eosinophil %: 1.4 %
Hematocrit: 36 % — ABNORMAL LOW (ref 37–52)
Hemoglobin: 12.3 g/dL (ref 12.0–17.0)
IMM Granulocytes #: 0.1 10*3/uL — ABNORMAL HIGH (ref 0.0–0.0)
IMM Granulocytes: 0.7 %
Lymph # K/uL: 1 10*3/uL (ref 1.0–5.0)
Lymphocyte %: 13.9 %
MCH: 31 pg (ref 26–32)
MCHC: 34 g/dL (ref 32–37)
MCV: 91 fL (ref 75–100)
Mono # K/uL: 1.6 10*3/uL — ABNORMAL HIGH (ref 0.1–1.0)
Monocyte %: 23.7 %
Neut # K/uL: 4.2 10*3/uL (ref 1.5–6.5)
Nucl RBC # K/uL: 0 10*3/uL (ref 0.0–0.0)
Nucl RBC %: 0 /100 WBC (ref 0.0–0.2)
Platelets: 173 10*3/uL (ref 150–450)
RBC: 3.9 MIL/uL — ABNORMAL LOW (ref 4.0–6.0)
RDW: 13.3 % (ref 0.0–15.0)
Seg Neut %: 60 %
WBC: 6.9 10*3/uL (ref 3.5–11.0)

## 2021-10-03 LAB — HOLD GREEN NO GEL

## 2021-10-03 LAB — APTT: aPTT: 29.5 s (ref 25.8–37.9)

## 2021-10-03 LAB — RUQ PANEL (ED ONLY)
ALT: 157 U/L — ABNORMAL HIGH (ref 0–50)
AST: 114 U/L — ABNORMAL HIGH (ref 0–50)
Albumin: 3.7 g/dL (ref 3.5–5.2)
Alk Phos: 192 U/L — ABNORMAL HIGH (ref 40–130)
Amylase: 93 U/L (ref 28–100)
Bilirubin,Direct: <0.2 mg/dL (ref 0.0–0.3)
Bilirubin,Total: 0.4 mg/dL (ref 0.0–1.2)
Lipase: 52 U/L (ref 13–60)
Total Protein: 6.2 g/dL — ABNORMAL LOW (ref 6.3–7.7)

## 2021-10-03 LAB — BASIC METABOLIC PANEL
Anion Gap: 14 (ref 7–16)
CO2: 23 mmol/L (ref 20–28)
Calcium: 8 mg/dL — ABNORMAL LOW (ref 8.6–10.2)
Chloride: 94 mmol/L — ABNORMAL LOW (ref 96–108)
Creatinine: 1.56 mg/dL — ABNORMAL HIGH (ref 0.67–1.17)
Glucose: 119 mg/dL — ABNORMAL HIGH (ref 60–99)
Lab: 24 mg/dL — ABNORMAL HIGH (ref 6–20)
Potassium: 3.8 mmol/L (ref 3.3–5.1)
Sodium: 131 mmol/L — ABNORMAL LOW (ref 133–145)
eGFR BY CREAT: 44 * — AB

## 2021-10-03 LAB — HOLD GRAY

## 2021-10-03 MED ORDER — IOHEXOL 350 MG/ML (OMNIPAQUE) IV SOLN 500ML BOTTLE *I*
1.0000 mL | Freq: Once | INTRAVENOUS | Status: AC
Start: 2021-10-03 — End: 2021-10-03
  Administered 2021-10-03: 135 mL via INTRAVENOUS

## 2021-10-03 MED ORDER — DEXTROSE 5 % FLUSH FOR PUMPS *I*
0.0000 mL/h | INTRAVENOUS | Status: DC | PRN
Start: 2021-10-03 — End: 2021-10-04

## 2021-10-03 MED ORDER — SODIUM CHLORIDE 0.9 % FLUSH FOR PUMPS *I*
0.0000 mL/h | INTRAVENOUS | Status: DC | PRN
Start: 2021-10-03 — End: 2021-10-04

## 2021-10-03 NOTE — Comprehensive Assessment (Signed)
Adult Social Work Initial Assessment    SW met with the pt daughter at bedside to complete SW comprehensive assessment below. Pt is blind and deaf and lives with daughter and son in law with first floor set up. Pt gets assistance with all ADL's. Pt is a one assist with transfers and while ambulating with a walker or wheelchair. Pt is able to feed himself with set up. Pt has an aide that comes in three times a week for two hours to give her a break and assist with the pt with ADL's.   Perrin Smack, LMSW  Emergency Department Social Worker  [phone]    Demographics:  Religious Beliefs: None  [place] of Residence: [place]  Marital status: Widowed  Ethnicity/Race: Caucasian  Primary Language: English  Primary Care Taker of?: No one    Risk Factors:  Risk Factors: Age related issues, Adjustment to Dx/Injury/Illness    Advance Directive:  *Has patient (or family) completed any of the following? (select all that apply): None completed    Personal Contacts/Support System:  Spokesperson Name: Cleda Clarks  Relationship to Patient : Daughter  Phone Number(s): [phone]  Has Spokesperson been notified of admission?: Yes  Method of Notification: In Person  Alternate Spokesperson?: Yes  Spokesperson Alternate: Geanie Logan  Relationship to Patient : Son-in-law  Phone Number(s): [phone]     Living Situation:  Lives With: Family  Can they assist patient after discharge?: Yes  Primary Care Taker of?: No one    Home Geography:  Type of Home: 2 Story home  # Of Steps In Home: 0  # Steps to Enter Home: 0 (ramp)  Bedroom: First floor  Bathroom: First floor - full  Utilitites Working: Yes    Psychosocial:  Person assessed: Family  Coping Status: Appropriate to Stressor    Baseline ADL functioning:  Transfers: One-assist  Ambulation: With assistance  Assistive Device: Environmental consultant, Multimedia programmer: With assistance  Meal Prep: With assistance  Able to feed self?: Yes, With setup  Household maintenance/chores: With  assistance  Able to drive?: No    Dialysis:  Does Patient have Dialysis?: No    Income Information:  Vocational: Retired  Income Situation: Government social research officer Information: Community education officer, [place]  Prescription Coverage: and has  Served in [place] military: No  Is patient OPWDD connected? : No  Is the patient presumed eligible for OPWDD services?: No    Home Care Services:  Do you currently have home care services?: Yes  Home Care Agency Name:  ([facility]  Home Care Agency services delivered: [facility] (3 days for 2 hrs a day)  Current home equipment available : Environmental consultant, Wheelchair, Raised toilet seat, Grab bars, Shower chair     Home Oxygen:  Do you have home oxygen?: No     Perrin Smack, LMSW   6:37 PM on 10/03/2021

## 2021-10-03 NOTE — Discharge Instructions (Addendum)
You were seen due to concern for elevated liver function tests. Labs showed no active infection and imaging of your liver showed no obvious source of the elevated liver labs. You felt ready to be discharged once the tests returned normal.      Please return to the emergency department if you develop fevers, chills, chest pain, shortness of breath, abdominal pain, low blood pressure, or confusion.    Please schedule an appointment with your primary care provider.  He will have access to the results of your hepatitis tests.  Please discuss starting a new antibiotic for your left ankle wound with your primary care provider.

## 2021-10-03 NOTE — ED Triage Notes (Signed)
Prehospital medications given: No

## 2021-10-03 NOTE — ED Provider Notes (Addendum)
History     Chief Complaint   Patient presents with   ? 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 Seen in ED 8/30 for AMS in setting of left ankle wound. LFTs were slightly elevated (LAT 97,AST 114, alk phos 79), RUQ [place] with normal gallbladder/common bile duct but cirrhotic liver morphology. Was told to follow-up with PCP and resume saline tablets. Outpatient 8/31 labs with ALT 196, AST 172, alk phos 166, t bili 0.5. PCP instructed patient to go to [facility] ED due to concerning labs, discontinued bactrim. Per patient, he has no acute changes in mental status, denies fevers, chills, chest pain, worsening dyspnea, abdominal pain, nausea/vomiting, diarrhea, weakness. Left ankle wound slightly more erythematous/warm compared to yesterday per daughter who is at bedside.             Medical/Surgical/Family History     Past Medical History:   Diagnosis Date   ? Asthma    ? Cerebral artery occlusion with cerebral infarction    ? COPD (chronic obstructive pulmonary disease)    ? COPD (chronic obstructive pulmonary disease)    ? Hyperlipidemia    ? Hypertension    ? Meningitis    ? Testicle cancer         Patient Active Problem List   Diagnosis Code   ? Benign Prostatic Hyperplasia 600   ? BPH (benign prostatic hyperplasia) N40.0   ? Elevated PSA R97.20   ? Melanoma C43.9   ? Neurogenic bladder N31.9   ? Dysphagia, unspecified type R13.10            Past Surgical History:   Procedure Laterality Date   ? APPENDECTOMY     ? COCHLEAR IMPLANT      left ear   ? EYE SURGERY     ? PROSTATE SURGERY     ? superpubic cath     ? TESTICLE REMOVAL     ? TONSILLECTOMY AND ADENOIDECTOMY       Family History   Problem Relation Age of Onset   ? Heart Disease  Father           Social History     Tobacco Use   ? Smoking status: Former     Packs/day: 1.00     Years: 10.00     Pack years: 10.00     Types: Cigarettes     Quit date: 11/17/1965     Years since quitting: 55.9   ? Smokeless tobacco: Never   Substance Use Topics   ? Alcohol use: Yes     Comment: 1-2 weekly   ? Drug use: Never     Living Situation     Questions Responses    Patient lives with     Homeless     Caregiver for other family member     External Services     Employment     Domestic Violence Risk                 Review of Systems   Review of Systems   Constitutional: Negative for activity change, appetite change, chills, diaphoresis, fatigue and fever.   HENT: Negative for congestion and sore throat.    Eyes:  Baseline vision loss     Respiratory: Negative for cough, chest tightness and shortness of breath.    Cardiovascular: Negative for chest pain, palpitations and leg swelling.   Gastrointestinal: Negative for abdominal distention, abdominal pain, constipation, diarrhea, nausea and vomiting.        Baseline gas, last BM yesterday   Genitourinary:        Suprapubic catheter in place, changed by daughter yesterday   Musculoskeletal: Negative for arthralgias and myalgias.   Skin:        Mild left ankle rash near wound site   Neurological: Positive for tremors. Negative for dizziness, syncope, speech difficulty, weakness, light-headedness and headaches.        Baseline hand tremor       Physical Exam     Triage Vitals  Triage Start: Start, (10/03/21 1506)  First Recorded BP: 124/62, Resp: 14, Temp: 36.5 ?C (97.7 ?F), Temp src: TEMPORAL Oxygen Therapy SpO2: 97 %, O2 Device: None (Room air), Heart Rate: 72, (10/03/21 1507)  .  First Pain Reported  0-10 Scale: 0, (10/03/21 1507)       Physical Exam  Constitutional:       Appearance: Normal appearance.   HENT:      Head: Normocephalic.      Mouth/Throat:      Mouth: Mucous membranes are dry.      Pharynx: No oropharyngeal exudate or posterior  oropharyngeal erythema.   Eyes:      General: No scleral icterus.     Extraocular Movements: Extraocular movements intact.   Cardiovascular:      Rate and Rhythm: Normal rate and regular rhythm.      Pulses: Normal pulses.      Heart sounds: Normal heart sounds. No murmur heard.     No friction rub. No gallop.   Pulmonary:      Effort: Pulmonary effort is normal.      Breath sounds: No rhonchi or rales.      Comments: Mild diffuse expiratory wheezing  Abdominal:      General: Bowel sounds are normal. There is distension.      Palpations: There is no mass.      Tenderness: There is no abdominal tenderness. There is no guarding or rebound.   Genitourinary:     Comments: Suprapubic catheter in place, no inflammatory changes appreciated  Musculoskeletal:         General: No swelling or tenderness.      Cervical back: Normal range of motion and neck supple. No rigidity.      Right lower leg: No edema.      Left lower leg: No edema.      Comments: Right lower ankle wound w/out drainage. However, erythematous, slightly swollen and warm.    Skin:     Capillary Refill: Capillary refill takes less than 2 seconds.      Findings: Rash present.      Comments: Left lower extremity mild petechial rash on medial aspect of lower leg just proximal to ankle.    Neurological:      General: No focal deficit present.      Mental Status: He is alert and oriented to person, place, and time.   Psychiatric:         Mood and Affect: Mood normal.         Medical Decision Making   Patient seen by me on:  10/03/2021    Assessment:  86 yo w/ complex PMH and recent ED  visit for left ankle wound treated with bactrim presenting with acute LFT elevation. Bactrim stopped outpatient due to concern for hepatotoxicity. Distant hx of daily alcohol use.  Exam notable for stable vitals, mentating well without jaundiced appearance, no abdominal tenderness.  Left ankle wound without activ drainage but erythematous. Etiology likely bactrim-induced hepatotoxicity  in setting of chronic hepatic pathology such as likely cirrhosis.     Differential diagnosis:  TMP-SMX induced hepatotoxicity  Cirrhosis  Viral hepatitis  Intraabdominal malignancy   Cholangitis    Plan:  CBC w diff  BMP  RUQ panel  Viral hepatitis panel    ED Course and Disposition:  Attempted to call PCP to discuss ED referral plan, however, office closed. Repeat LFTs with mild improvement (ALT 157, AST 114). However, Alk phos with further elevation to 192. Abd CT with no acute hepatic or biliary duct findings. Remained hemodynamically stable. Discharge planning and return precautions discussed with patient and son-in-law. Determined to be ready for discharge.               Kevin Fenton, MD      Resident Attestation:    Patient seen by me on 10/03/2021.    I saw and evaluated the patient. I agree with the resident's/fellow's findings and plan of care as documented above.    Author:  Joanna Puff, MD          Kevin Fenton, MD  Resident  10/03/21 2136       Joanna Puff, MD  10/03/21 2150

## 2021-10-03 NOTE — ED Triage Notes (Signed)
Pt had outpatient labs done yesterday. Found to have elevated liver enzymes. Pt is blind and deaf with cochlear implant. Pt is bed bound. Lives with daughter who is on the way. Pt denies symptoms.     Prehospital medications given: No

## 2021-10-04 LAB — HEPATITIS A,B,C,PANEL
HBV Core Ab: NEGATIVE
HBV S Ab Quant: 87.86 m[IU]/mL
HBV S Ab: POSITIVE
HBV S Ag: NEGATIVE
Hep C Ab: NEGATIVE
Hepatitis A IGG: POSITIVE

## 2021-10-06 LAB — BLOOD CULTURE
Bacterial Blood Culture: 0
Bacterial Blood Culture: 0

## 2021-10-09 ENCOUNTER — Telehealth: Payer: Self-pay | Admitting: Audiology

## 2021-10-09 NOTE — Telephone Encounter (Signed)
Patients daughter returning your call, requesting a call back at your earliest convenience. Thank you!

## 2021-10-09 NOTE — Telephone Encounter (Signed)
Called her back she noted that he no longer has a functioning battery as well as his processor is still intermittent.  His current CP 910 processor is obsolete and un-repairable and you can no longer get replacement parts such as batteries.  We discussed that he would have to upgrade to either and N8 CP 1110 sound processor or the Kanso 2 processor.  He also has some discomfort wearing his processor over his ear, so they may consider the Kanso 2 processor.      We discussed upgrade process.  [name] will intiate a call to [employer] to begin upgrade process.

## 2021-10-09 NOTE — Telephone Encounter (Signed)
Called Riley [name] back LM asking for more details.  She initiated upgrade last November, Did he finally get upgrade or are they seeking an upgrade now.  Asked her to call me back with more details.

## 2021-10-09 NOTE — Telephone Encounter (Signed)
Patients daughter called on behalf of patient and would like to get the patient scheduled to get a new CI. Please advise, thank you.

## 2021-11-28 ENCOUNTER — Telehealth: Payer: Self-pay | Admitting: Gastroenterology

## 2021-11-28 NOTE — Telephone Encounter (Signed)
Patient daughter called back was made aware of cancellation - advised we would call back at a later date to reschedule.

## 2021-11-28 NOTE — Telephone Encounter (Signed)
Called left message for call back - need to cancel 01/13/22 procedure

## 2021-12-02 ENCOUNTER — Other Ambulatory Visit: Payer: Self-pay | Admitting: Urology

## 2021-12-03 NOTE — Telephone Encounter (Signed)
Last appointment:08/22/2021  Last telemedicine visit:  Visit date not found   Next appointment:   Future Appointments   Date Time Provider Department Center   08/24/2022 11:30 AM Jolene Provost, PA D[facility] None

## 2022-01-13 ENCOUNTER — Ambulatory Visit: Admit: 2022-01-13 | Payer: Self-pay | Admitting: Gastroenterology

## 2022-01-13 DIAGNOSIS — R1310 Dysphagia, unspecified: Secondary | ICD-10-CM | POA: Insufficient documentation

## 2022-01-13 SURGERY — EGD (ESOPHAGOGASTRODUODENOSCOPY)
Anesthesia: Monitor Anesthesia Care

## 2022-01-19 ENCOUNTER — Other Ambulatory Visit: Payer: Self-pay

## 2022-01-19 ENCOUNTER — Ambulatory Visit: Payer: Medicare (Managed Care) | Attending: Audiology | Admitting: Audiology

## 2022-01-19 DIAGNOSIS — H903 Sensorineural hearing loss, bilateral: Secondary | ICD-10-CM | POA: Insufficient documentation

## 2022-01-19 NOTE — Progress Notes (Signed)
COCHLEAR IMPLANT UPGRADE    Patient arrived with his daughter and his N8 CP 1110 upgrade (SN#: [id number]) color sand with a 1x magnet (2 rechargeable batteries).  He was using a a 1/2 magnet on his N6 device.  But Daughter reports that the coil would often fall off.  He had a 1/2 magnet with a plastic spacer.      I modified his preferred N6 map and adjusted T and C levels for comfort and easy understanding and mapped his CP 1110 processor as follows:  Map 19 position 1 SCAN 2 FF  Map 19 position 2 SCAN 2     Paired his processor to his CR310 remote [phone]) and reviewed with his daughter how to adjust.  We also reviewed trying a longer ear hook and I fitted him today with 1x magnet with plastic spacer and asked his daughter to watch the implant site carefully as we may have to reduce to 1/2 magnet size in future is skin begins to break down or too tight or sore.      [facility]. Pamala Hurry, PhD, MBA  Director of Research and Education in Audiology   [facility]

## 2022-02-05 ENCOUNTER — Telehealth: Payer: Self-pay | Admitting: Urology

## 2022-02-05 MED ORDER — STERILE WATER FOR IRRIGATION IR SOLN *I*
5 refills | Status: AC
Start: 2022-02-05 — End: ?

## 2022-02-05 NOTE — Telephone Encounter (Signed)
sent 

## 2022-02-05 NOTE — Telephone Encounter (Signed)
Copied from CRM #[id number]. Topic: Medications/Prescriptions - Medication Question/Problem  >> Feb 05, 2022 11:19 AM Austin Miles wrote:  Lurena Joiner from [facility] is calling with a question on the medication Citric Ac-Gluconolact-Mg Carb (RENACIDIN).   The question is: Medication is on long term back order.  Would like to know if there is alterative ? Patient is currently having clogging concerns.     Please return their call at [phone] to advise.

## 2022-02-05 NOTE — Telephone Encounter (Signed)
Yes saline flushes as needed/daily.  Thank you

## 2022-02-10 ENCOUNTER — Telehealth: Payer: Self-pay | Admitting: Urology

## 2022-02-10 DIAGNOSIS — T839XXA Unspecified complication of genitourinary prosthetic device, implant and graft, initial encounter: Secondary | ICD-10-CM

## 2022-02-10 NOTE — Telephone Encounter (Unsigned)
Copied from CRM [id number]. Topic: Medications/Prescriptions - Medication Question/Problem  >> Feb 10, 2022 11:50 AM Luciana Axe wrote:  Patients daughter [name] called stating the bladder irrigation Solution is not made anymore and her dads cath keeps clogging. The Saline flushes are not helping either. [name] would like a call back to discuss what she should do. [name] can be reached at [phone]

## 2022-02-11 NOTE — Telephone Encounter (Signed)
Spoke with daughter per HIPAA and she is irrigating his catheter with 90 ml sterile water twice daily. Instructed her to increase to three times daily and increase fluids. She stated he drinks a sufficient amount of fluids. Urine ordered to rule out infection. Verbalized agreement to the plan.

## 2022-02-17 ENCOUNTER — Other Ambulatory Visit
Admission: RE | Admit: 2022-02-17 | Discharge: 2022-02-17 | Disposition: A | Discharge status: Home / Self Care | Payer: Medicare (Managed Care) | Source: Ambulatory Visit | Attending: Urology | Admitting: Urology

## 2022-02-17 DIAGNOSIS — Y828 Other medical devices associated with adverse incidents: Secondary | ICD-10-CM | POA: Insufficient documentation

## 2022-02-17 DIAGNOSIS — T839XXA Unspecified complication of genitourinary prosthetic device, implant and graft, initial encounter: Secondary | ICD-10-CM | POA: Insufficient documentation

## 2022-02-17 LAB — URINE MICROSCOPIC (IQ200): WBC,UA: >50 /hpf — AB (ref 0–5)

## 2022-02-17 LAB — URINALYSIS REFLEX TO CULTURE
Glucose,UA: NEGATIVE
Ketones, UA: NEGATIVE
Nitrite,UA: NEGATIVE
Protein,UA: NEGATIVE
Specific Gravity,UA: 1.011 (ref 1.002–1.030)
pH,UA: 7 (ref 5.0–8.0)

## 2022-02-18 LAB — AEROBIC CULTURE

## 2022-02-19 ENCOUNTER — Telehealth: Payer: Self-pay | Admitting: Urology

## 2022-02-19 MED ORDER — AMPICILLIN 500 MG PO CAPS *I*
500.0000 mg | ORAL_CAPSULE | Freq: Two times a day (BID) | ORAL | 0 refills | Status: DC
Start: 2022-02-19 — End: 2023-03-16

## 2022-02-19 NOTE — Addendum Note (Signed)
Addended by: Andria Meuse B on: 02/19/2022 01:14 PM     Modules accepted: Orders

## 2022-02-19 NOTE — Telephone Encounter (Signed)
Urine culture showing presence of bacteria.   Please assess if patient is symptomatic  Will further treat if needed  Thanks  Jolene Provost, PA      Aerobic Culture Proteus mirabilis Abnormal    Comment:  >100,000/ml  Isolate identified by MALDI-TOF  .   Resulting Agency [facility]        Susceptibility     Proteus mirabilis     MIC     Amikacin Sensitive/Intermediate     Ampicillin Sensitive     Ampicillin/sulbactam Sensitive     Aztreonam Sensitive     Cefazolin - Urine Sensitive     Cefepime Sensitive     Ceftriaxone Sensitive     Ciprofloxacin Resistant     Ertapenem Sensitive     Gentamicin Sensitive     Levofloxacin Resistant     Nitrofurantoin Resistant     Piperacillin/Tazobactam Sensitive     Tobramycin Sensitive     Trimethoprim/Sulfa Resistant

## 2022-02-19 NOTE — Telephone Encounter (Signed)
Ampicillin sent to the pharmacy  Continue to push fluids and flushing of catheter    Jolene Provost, PA

## 2022-02-19 NOTE — Telephone Encounter (Signed)
Called and spoke with [name], ok per hippa  Advised of below  States understanding  Thanks Ladona Ridgel

## 2022-02-20 NOTE — Telephone Encounter (Signed)
Byrd Hesselbach the nurse  needs orders  signed from 01/25/22 to 03/25/22 faxed to [phone] call Byrd Hesselbach is needed to discuss further [phone]

## 2022-02-20 NOTE — Telephone Encounter (Signed)
Faxed/mailed orders

## 2022-02-25 NOTE — Telephone Encounter (Signed)
Copied from CRM #[id number]. Topic: Information Request - Other  >> Feb 25, 2022  9:21 AM Sondra Come wrote:        Byrd Hesselbach [facility] called and stated that on 02/04/2022 a Home Health Rectification Plan of Care was Faxed Over for the Dates 01/25/2022 through 03/25/2022 and was calling to see if the Form has been received and completed. Byrd Hesselbach stated that the Form is Past Due.      Fax Number is:  [phone] ATTN: Girard Cooter contact information is: [phone]    Patient Telephone Information:  Mobile          [phone]

## 2022-02-26 ENCOUNTER — Encounter: Payer: Self-pay | Admitting: Gastroenterology

## 2022-02-26 NOTE — Telephone Encounter (Signed)
Orders faxed again.

## 2022-03-30 ENCOUNTER — Telehealth: Payer: Self-pay | Admitting: Urology

## 2022-03-30 MED ORDER — ACETIC ACID 0.25 % IRRIGATION SOLN *I*
Freq: Every day | 5 refills | Status: DC
Start: 2022-03-30 — End: 2022-04-02

## 2022-03-30 NOTE — Telephone Encounter (Signed)
Copied from [place] [id number]. Topic: Medications/Prescriptions - Medication Question/Problem  >> Mar 30, 2022 10:40 AM Berniece Pap wrote:  [name], the patients daughter is calling in regards to the patients catheter. She stated they are having a hard time getting the medication renacidin due to manufacturing and the patients catheter is clogging every 5-8 days. She states he needs either weekly catheter changes, a new order or a different medication because flushing 3 times daily is not helping. [name] can be reached at [phone]

## 2022-03-30 NOTE — Telephone Encounter (Signed)
Spoke with daughter and given instructions below. Verbalized understanding.

## 2022-03-30 NOTE — Telephone Encounter (Signed)
Agree patient needs to continue to increased water intake   We will attempt further treatment with acetic acid irrigation solution flushing daily, change catheter if clogged.  Vena Rua, PA

## 2022-04-02 MED ORDER — RENACIDIN IR SOLN *I*
30.0000 mL | Freq: Every day | 6 refills | Status: DC
Start: 2022-04-02 — End: 2022-06-11

## 2022-04-02 NOTE — Addendum Note (Signed)
Addended by: Arbutus Leas on: 04/02/2022 09:51 AM     Modules accepted: Orders

## 2022-04-02 NOTE — Telephone Encounter (Signed)
Riley [name],  Received fax from pharm regarding patients acetic acid irrigation solution.  Unfortunately, this medication has been discontinued.  They are requesting alternative.  [facility] to see what other options are available to the patient  Renacidin on back order. They do not have other options in the pharmacy.     Thanks [employer]

## 2022-04-02 NOTE — Telephone Encounter (Signed)
Please send renacidin irrigation solution script to [facility], they will compound and ship to the patient, I have pended    Called and spoke with [name], patients daughter, to let her know she should be hearing from the pharmacy    Thanks Lovena Le

## 2022-04-02 NOTE — Telephone Encounter (Signed)
sent 

## 2022-04-02 NOTE — Addendum Note (Signed)
Addended by: Joneen Roach B on: 04/02/2022 09:55 AM     Modules accepted: Orders

## 2022-04-17 ENCOUNTER — Telehealth: Payer: Self-pay | Admitting: Urology

## 2022-04-17 NOTE — Telephone Encounter (Signed)
Copied from CRM [id number]. Topic: Access to Care - Medical Records Request  >> Apr 17, 2022 11:07 AM Majel Homer wrote:  Byrd Hesselbach, [facility] is calling to requesr we fax off Riley [name]'s plan of care homecare cert for 4/54/09- 05/24/22 to [phone]. Please fax.    Byrd Hesselbach  [phone]

## 2022-04-20 NOTE — Telephone Encounter (Signed)
faxed

## 2022-04-24 NOTE — Telephone Encounter (Signed)
faxed

## 2022-04-24 NOTE — Telephone Encounter (Unsigned)
Copied from CRM [id number]. Topic: Access to Care - Labs/Orders/Imaging  >> Apr 24, 2022  9:26 AM Dennie Bible wrote:  [facility] is calling regarding not receiving signed home care orders Plan of Care 03/26/22- 05/24/22 See notes below. Caller provided an alternate fax number [phone]  Writer called Georgette Dover spoke to [facility] she requersted the caller resend homer care orders to fax [phone]. Writer advised caller She will send the orders to the fax.    [facility] can be reached at [phone].

## 2022-06-11 ENCOUNTER — Other Ambulatory Visit: Payer: Self-pay | Admitting: Urology

## 2022-06-11 MED ORDER — RENACIDIN IR SOLN *I*
30.0000 mL | Freq: Every day | 6 refills | Status: DC
Start: 2022-06-11 — End: 2023-01-18

## 2022-06-11 NOTE — Telephone Encounter (Signed)
Received prescription request for [facility]idin[facility]  Pended  Please sign, thanks Ladona Ridgel

## 2022-08-21 ENCOUNTER — Telehealth: Payer: Self-pay | Admitting: Urology

## 2022-08-21 NOTE — Telephone Encounter (Signed)
 Orders received, signed, and faxed.

## 2022-08-21 NOTE — Telephone Encounter (Signed)
Copied from CRM #[id number]. Topic: Access to Care - Labs/Orders/Imaging  >> Aug 21, 2022 12:01 PM Tollie Eth wrote:  Byrd Hesselbach from [facility] calling to request signed Home Health Certification and Plan of Care 07/24/22-09/21/22. She faxed yesterday, will refax today to [phone]. Due to Medicare the documentation is due today. Reports she called Tuesday and spoke with an [facility] rep, but no note found on file.    Byrd Hesselbach can be reached at [phone].

## 2022-08-24 ENCOUNTER — Encounter: Payer: Self-pay | Admitting: Urology

## 2022-08-24 ENCOUNTER — Ambulatory Visit: Payer: Medicare (Managed Care) | Attending: Urology | Admitting: Urology

## 2022-08-24 VITALS — Ht 73.0 in | Wt 200.0 lb

## 2022-08-24 DIAGNOSIS — Z9359 Other cystostomy status: Secondary | ICD-10-CM

## 2022-08-24 DIAGNOSIS — N319 Neuromuscular dysfunction of bladder, unspecified: Secondary | ICD-10-CM

## 2022-08-24 NOTE — Progress Notes (Signed)
Dear Dr. Saul Fordyce, MD,     Thank you for referring [name] to me for consultation for his neurogenic bladder. I saw [name] on 08/24/2022.     HPI:  87 y.o. yo male referred to our office for neurogenic bladder SP tube care.   PMH significant for COPD, asthma, testicular cancer s/p left orchiectomy, prediabetes, HTN, HLD.  Previously evaluated for neurogenic bladder following meningitis in 2015, currently managed via suprapubic catheter x6 years  Patient lives with daughter, he is legally deaf and legally blind. His daughter Riley [name] has supplemented much of the history. She is his primary care taker. Patient previously lived full time in [place] and has since moved to the area.     Interim history:  Patient presents for 1 year follow-up  Continues with chronic SP tube management, exchanged by his daughter and by [facility] nurses in home.   Cath changes occur every 3 weeks without issue.  Reports good urine output, clear color   Very minimal heme noted on occasion which clears up within a few hours   Notes moderate sediment concerns which they continue use of renacindin daily  Denies any recent infection concerns  Denies any fever, chills, abdominal/flank pain, gross hematuria    Prior CT imaging shows normal urologic findings  Patient reports prior history of elevated PSA with with NEG TRUS bx x2, 10-15 years ago.   No longer requires PSA testing      Non smoker  No family history of prostate, bladder, kidney, testicular cancers.   Allergies   Allergen Reactions    Peanut-Derived      Throat swells      Bactrim [Sulfamethoxazole-Trimethoprim] Other (See Comments)     Confusion, shakiness    No Known Drug Allergy      Created by Conversion - 0;        Past Medical History:   Diagnosis Date    Asthma     Cerebral artery occlusion with cerebral infarction     COPD (chronic obstructive pulmonary disease)     COPD (chronic obstructive pulmonary disease)     Hyperlipidemia     Hypertension      Meningitis     Testicle cancer        Past Surgical History:   Procedure Laterality Date    APPENDECTOMY      COCHLEAR IMPLANT      left ear    EYE SURGERY      PROSTATE SURGERY      superpubic cath      TESTICLE REMOVAL      TONSILLECTOMY AND ADENOIDECTOMY         Current Outpatient Medications   Medication Sig    Citric Ac-Gluconolact-Mg Carb (RENACIDIN) 30 mLs by Intracatheter route daily.    ampicillin (PRINCIPEN) 500 mg capsule Take 1 capsule (500 mg total) by mouth 2 times daily  for Infection of Genitals and/or Urinary Tract    sterile water irrigation Irrigate/flush  suprapubic catheter daily or as needed to prevent clogging    omeprazole (PRILOSEC) 20 mg capsule Take 1 capsule (20 mg total) by mouth daily (before breakfast).    furosemide (LASIX) 40 mg tablet Take 1 tablet (40 mg total) by mouth every morning.    calcium carbonate 250 mg/mL suspension Take 5 mLs (1,250 mg total) by mouth 2 times daily (with meals).    magnesium gluconate (MAGONATE) 500 MG tablet Take 1 tablet (500 mg total) by  mouth 2 times daily.    Lactobacillus (ACIDOPHILUS) 100 MG CAPS Take 100 capsules by mouth daily    amLODIPine (NORVASC) 5 MG tablet Take 1 tablet (5 mg total) by mouth daily.    citalopram (CELEXA) 20 MG tablet Take 1 tablet (20 mg total) by mouth daily.    Melatonin 10 MG CAPS Take 10 mg by mouth daily    triamcinolone (KENALOG) 0.1 % cream Apply topically daily.    traZODone (DESYREL) 50 MG tablet Take 1 tablet (50 mg total) by mouth nightly.    atorvastatin (LIPITOR) 10 MG tablet        Family History   Problem Relation Age of Onset    Heart Disease Father        Social History     Socioeconomic History    Marital status: Widowed   Tobacco Use    Smoking status: Former     Packs/day: 1.00     Years: 10.00     Additional pack years: 0.00     Total pack years: 10.00     Types: Cigarettes     Quit date: 11/17/1965     Years since quitting: 56.8    Smokeless tobacco: Never   Substance and Sexual Activity    Alcohol  use: Yes     Comment: 1-2 weekly    Drug use: Never    Sexual activity: Not Currently     Partners: Female       Physical Exam:  Vitals:    08/24/22 1131   Weight: 90.7 kg (200 lb)   Height: 1.854 m (6\' 1" )     Limited exam, completed via video  GENERAL:  Deaf/leagally blind. No acute distress, well developed, well nourished. Wheelchair bound, unable to ambulate.    NEUROLOGIC:  Oriented to person, place, time, and situation.  PSYCHIATRIC:  Normal mood and affect.  HEENT:  Normocephalic, atraumatic.  SKIN:  Normal color, turgor, texture, hydration.  RESPIRATORY:  Respirations unlabored.      Labs:  No results found for this or any previous visit (from the past 24 hour(s)).    Lab Results   Component Value Date    PSAR3 2.81 10/31/2010    PSAR3 3.01 10/24/2009        Imaging:  10/03/2021-CT abd/pelvis W contrast:   FINDINGS:  Chest Base: Trace pericardial effusion. Coronary calcifications.    Liver/Biliary Tract: The nodular morphology is better appreciated on the prior ultrasound study.    Pancreas: Unremarkable.    Spleen: Unremarkable.    Adrenals: Unremarkable.    Kidneys and Collecting Systems: Unremarkable.    Lymph Nodes: No lymphadenopathy.    Vessels: Mild atherosclerotic disease.    GI Tract/Mesentery and Peritoneal Cavity: Small hiatal hernia. Colonic diverticulosis and no evidence of diverticulitis.    Visualized Reproductive Organs: Heterogeneous and massively enlarged prostate with coarse calcifications.    Bladder: Suprapubic catheter in place.    Soft Tissues/Musculoskeletal: Small collection at the right greater trochanter. No acute osseous abnormalities.    Impression   The nodular morphology is better appreciated on the prior ultrasound study. If cirrhosis remains of clinical concern, consider further evaluation with ultrasound elastography. No biliary ductal dilation.    Heterogeneous and massively enlarged prostate. Recommend correlation with PSA levels.    Small collection at the right greater  trochanter likely representing trochanteric bursitis.    Small hiatal hernia.     3//2022-Renal US:  FINDINGS:   Right Kidney:  Measures 11.2 cm.  Parenchyma:  Normal homogeneous echotexture.   Hydronephrosis:  None.   Calculi:  None.   Focal Lesions:  None.     Left Kidney:  Measures 10.0 cm.   Parenchyma:  Normal homogeneous echotexture.   Hydronephrosis:  None.   Calculi:  None.   Focal Lesions:  None.     Aorta:  Non-aneurysmal.     Inferior Vena Cava:  Patent.     Bladder:  Foley catheter visualized.   Jets:  Bilateral ureteral jets are documented.   Prevoid Volume: Not measured.   Postvoid Residual Volume:  Not measured.     Prostate:  Not visualized.     Miscellaneous:  None.       Impression   Normal renal ultrasound.        08/15/2018-Renal US:  FINDINGS:   Right Kidney:  Measures 10.8 cm.   Parenchyma:  Normal homogeneous echotexture.   Hydronephrosis:  Mild hydronephrosis.   Calculi:  None.   Focal Lesions:  None.     Left Kidney:  Measures 13.6 cm.   Parenchyma:  Normal homogeneous echotexture.   Hydronephrosis:  Mild hydronephrosis.   Calculi:  None.   Focal Lesions:  None.     Aorta:  Non-aneurysmal.     Inferior Vena Cava:  Patent.     Bladder:  Foley catheter in place.   Jets:  Bilateral ureteral jets are documented.   Prevoid Volume: Not measured.   Postvoid Residual Volume:  Not measured.     Prostate:  Enlarged measuring 25 cc..     Miscellaneous:  None.       Impression   Enlarged prostate.   Bilateral mild hydronephrosis        IMPRESSION: 87 y.o. male with neurogenic bladder with chronic urinary retention following meningitis in 2015, currently with chronic SP tube placement.  History of elevated PSA with negative TRUS biopsy x2    PLAN:  SP care/exchange in home by patients daughter and lifetime nursing care.   Continues with SP tube changes every 3 weeks and as needed flushing for obstruction  Continue Renacidin installations daily.   Encouraged adequate water intake.  PSA testing no longer  required based on his age and health status.   Prior ct imaging shows normal urologic findings.     We will continue with yearly evaluations, they will call sooner with questions/concerns.     Thank you for allowing to participate in your patient care. I will keep you informed regarding his future follow-up.      Sincerely,    Jolene Provost, [place]    7/22/202411:34 AM

## 2022-08-27 ENCOUNTER — Telehealth: Payer: Self-pay | Admitting: Urology

## 2022-08-27 NOTE — Telephone Encounter (Signed)
Mailed form to [facility] with updated chart notes

## 2022-10-13 ENCOUNTER — Telehealth: Payer: Self-pay | Admitting: Urology

## 2022-10-13 NOTE — Telephone Encounter (Signed)
Copied from CRM [id number]. Topic: Access to Care - Labs/Orders/Imaging  >> Oct 13, 2022  2:42 PM Collie Siad wrote:  Byrd Hesselbach [facility] is looking for  8/20-10/18  plan of care in home care paperwork to be signed and faxed to [phone]

## 2022-10-14 NOTE — Telephone Encounter (Signed)
[facility] and they will have the case manager call me back.

## 2022-10-15 ENCOUNTER — Encounter: Payer: Self-pay | Admitting: Gastroenterology

## 2022-10-15 NOTE — Telephone Encounter (Signed)
Orders faxed

## 2022-10-19 NOTE — Telephone Encounter (Unsigned)
Copied from CRM #[id number]. Topic: Access to Care - Speak to Provider/Office Staff  >> Oct 19, 2022  4:13 PM Glenard Haring wrote:  Neysa Bonito, the patient's home care nurse, is calling to speak with Robin regarding orders. She states that she will not be back into the office until Friday.     Please return their call at [phone].

## 2022-10-22 ENCOUNTER — Encounter: Payer: Self-pay | Admitting: Gastroenterology

## 2022-10-22 NOTE — Telephone Encounter (Signed)
Orders faxed again.

## 2022-12-04 ENCOUNTER — Encounter: Payer: Self-pay | Admitting: Gastroenterology

## 2023-01-18 ENCOUNTER — Other Ambulatory Visit: Payer: Self-pay | Admitting: Urology

## 2023-01-18 NOTE — Telephone Encounter (Signed)
Prescription was last filled Jun 11 2022    Patient was last seen 08/24/22  telemed    Plan from last visit: PLAN:  SP care/exchange in home by patients daughter and lifetime nursing care.   Continues with SP tube changes every 3 weeks and as needed flushing for obstruction  Continue Renacidin installations daily.   Encouraged adequate water intake.  PSA testing no longer required based on his age and health status.   Prior ct imaging shows normal urologic findings.      We will continue with yearly evaluations, they will call sooner with questions/concerns.      Thank you for allowing to participate in your patient care. I will keep you informed regarding his future follow-up.        Sincerely,     Jolene Provost, [place]     7/22/202411:34 AM       Next appointment date: 08/26/23    Please review and sign if appropriate.

## 2023-02-12 ENCOUNTER — Telehealth: Payer: Self-pay | Admitting: Urology

## 2023-02-12 NOTE — Telephone Encounter (Signed)
Writer attempted to return call to [facility] to discuss scheduling, [facility] for a return call to discuss.

## 2023-02-12 NOTE — Telephone Encounter (Signed)
Copied from CRM [id number]. Topic: Access to Care - Speak to Provider/Office Staff  >> Feb 12, 2023  8:18 AM Macarthur Critchley wrote:      Marthann Schiller [facility] is requesting to speak with the [facility] office.     Mitch -[facility] stated that this is regarding needing to schedule patient within the next 30 days in order to continue home health care services.    Writer advised that both Dr. Harold Hedge and Andria Meuse are booking into March, 2025 for the next available appointment.    Mitch -[facility] would like to know if there is anyway we can get the patient scheduled for a "face to face" within the next 30 days to continue the home health care order for changing his suprapubic catheter?    Please call Marthann Schiller -[facility] back at [phone] to discuss.

## 2023-02-15 ENCOUNTER — Encounter: Payer: Self-pay | Admitting: Gastroenterology

## 2023-02-16 NOTE — Telephone Encounter (Signed)
 Writer [facility] for Mitch to return call to schedule FUV.

## 2023-02-23 NOTE — Telephone Encounter (Signed)
 Writer called and spoke with Riley [name], PCP has signed for continuation of SP care at home, however PCP is retiring at the end of January 2025. New PCP may not sign so an appointment with our provider has been requested to ensure a face to face is had with a provider to continue home care. Writer called and spoke with Riley [name], patient's daughter, scheduled FUV for 02.05.25 at [facility] with Corwin Levins PA. Understanding was verbalized.

## 2023-02-26 ENCOUNTER — Encounter: Payer: Self-pay | Admitting: Gastroenterology

## 2023-03-09 ENCOUNTER — Other Ambulatory Visit: Payer: Self-pay | Admitting: Urology

## 2023-03-09 DIAGNOSIS — R3989 Other symptoms and signs involving the genitourinary system: Secondary | ICD-10-CM

## 2023-03-10 ENCOUNTER — Ambulatory Visit: Payer: Medicare (Managed Care) | Admitting: Urology

## 2023-03-10 ENCOUNTER — Telehealth: Payer: Self-pay | Admitting: Urology

## 2023-03-10 NOTE — Telephone Encounter (Unsigned)
Copied from CRM [id number]. Topic: Appointments - Schedule Appointment  >> Mar 10, 2023 12:13 PM Rodney Booze L wrote:  [name], Daughter, called to schedule FUV    Patient is scheduled for 03/16/23 with provider Sarah PA.  Reason for appointment: needs to be seen for services to continue  Referral on file? N/A    Patient can be reached if necessary at [phone].

## 2023-03-10 NOTE — Telephone Encounter (Unsigned)
Copied from CRM [id number]. Topic: Appointments - Cancel Appointment  >> Mar 10, 2023  8:02 AM Eldred Lievanos G wrote:  [name], Daughter,  is calling to cancel the patients  FUV appointment which is currently scheduled for 03-10-2023.    Reason for the cancellation: Transportation is sick    Has the appointment been cancelled? yes    Has the appointment been rescheduled? no    Date of new appointment?NA     Does the patient need a call back to reschedule?no    Will the patient call back when they are ready to schedule? yes    Patient can be contacted back at [phone] (home)   .

## 2023-03-16 ENCOUNTER — Other Ambulatory Visit: Payer: Self-pay

## 2023-03-16 ENCOUNTER — Other Ambulatory Visit
Admission: RE | Admit: 2023-03-16 | Discharge: 2023-03-16 | Disposition: A | Discharge status: Home / Self Care | Payer: Medicare (Managed Care) | Source: Ambulatory Visit | Attending: Family Medicine | Admitting: Family Medicine

## 2023-03-16 ENCOUNTER — Encounter: Payer: Self-pay | Admitting: Urology

## 2023-03-16 ENCOUNTER — Ambulatory Visit: Payer: Medicare (Managed Care) | Attending: Urology | Admitting: Urology

## 2023-03-16 VITALS — Ht 61.0 in | Wt 200.0 lb

## 2023-03-16 DIAGNOSIS — N319 Neuromuscular dysfunction of bladder, unspecified: Secondary | ICD-10-CM

## 2023-03-16 DIAGNOSIS — E7800 Pure hypercholesterolemia, unspecified: Secondary | ICD-10-CM | POA: Insufficient documentation

## 2023-03-16 DIAGNOSIS — F411 Generalized anxiety disorder: Secondary | ICD-10-CM | POA: Insufficient documentation

## 2023-03-16 DIAGNOSIS — E871 Hypo-osmolality and hyponatremia: Secondary | ICD-10-CM | POA: Insufficient documentation

## 2023-03-16 DIAGNOSIS — G629 Polyneuropathy, unspecified: Secondary | ICD-10-CM | POA: Insufficient documentation

## 2023-03-16 DIAGNOSIS — K219 Gastro-esophageal reflux disease without esophagitis: Secondary | ICD-10-CM | POA: Insufficient documentation

## 2023-03-16 LAB — CBC AND DIFFERENTIAL
Baso # K/uL: 0 10*3/uL (ref 0.0–0.2)
Eos # K/uL: 0.1 10*3/uL (ref 0.0–0.5)
Hematocrit: 40 % (ref 37–52)
Hemoglobin: 13.3 g/dL (ref 12.0–17.0)
IMM Granulocytes #: 0.1 10*3/uL — ABNORMAL HIGH (ref 0.0–0.0)
IMM Granulocytes: 0.7 %
Lymph # K/uL: 1.9 10*3/uL (ref 1.0–5.0)
MCV: 93 fL (ref 75–100)
Mono # K/uL: 1 10*3/uL (ref 0.1–1.0)
Neut # K/uL: 6.2 10*3/uL (ref 1.5–6.5)
Nucl RBC # K/uL: 0 10*3/uL (ref 0.0–0.0)
Nucl RBC %: 0 /100{WBCs} (ref 0.0–0.2)
Platelets: 219 10*3/uL (ref 150–450)
RBC: 4.3 MIL/uL (ref 4.0–6.0)
RDW: 13 % (ref 0.0–15.0)
Seg Neut %: 67.1 %
WBC: 9.2 10*3/uL (ref 3.5–11.0)

## 2023-03-16 LAB — COMPREHENSIVE METABOLIC PANEL
ALT: 16 U/L (ref 0–50)
AST: 15 U/L (ref 0–50)
Albumin: 4.2 g/dL (ref 3.5–5.2)
Alk Phos: 82 U/L (ref 40–130)
Anion Gap: 10 (ref 7–16)
Bilirubin,Total: <0.2 mg/dL (ref 0.0–1.2)
CO2: 26 mmol/L (ref 20–28)
Calcium: 8.9 mg/dL (ref 8.6–10.2)
Chloride: 94 mmol/L — ABNORMAL LOW (ref 96–108)
Creatinine: 1.13 mg/dL (ref 0.67–1.17)
Glucose: 100 mg/dL — ABNORMAL HIGH (ref 60–99)
Lab: 21 mg/dL — ABNORMAL HIGH (ref 6–20)
Potassium: 5.1 mmol/L (ref 3.3–5.1)
Sodium: 130 mmol/L — ABNORMAL LOW (ref 133–145)
Total Protein: 6.5 g/dL (ref 6.3–7.7)
eGFR BY CREAT: 64 *

## 2023-03-16 LAB — TSH WITH REFLEX TO FT4: TSH: 2.03 u[IU]/mL (ref 0.27–4.20)

## 2023-03-16 LAB — LIPID PANEL
Chol/HDL Ratio: 2.2
Cholesterol: 153 mg/dL
HDL: 69 mg/dL — ABNORMAL HIGH (ref 40–60)
LDL Calculated: 65 mg/dL
Non HDL Cholesterol: 84 mg/dL
Triglycerides: 103 mg/dL

## 2023-03-16 LAB — VITAMIN B12: Vitamin B12: 564 pg/mL (ref 232–1245)

## 2023-03-16 MED ORDER — DOVER SILICONE FOLEY CATH 20FR MISC
1.0000 | 11 refills | Status: AC
Start: 2023-03-16 — End: ?

## 2023-03-16 MED ORDER — BARD IRRIGATION TRAY KIT
1000.0000 mL | PACK | Freq: Two times a day (BID) | 11 refills | Status: AC | PRN
Start: 2023-03-16 — End: ?

## 2023-03-16 MED ORDER — RENACIDIN IR SOLN *I*
30.0000 mL | Freq: Every day | 6 refills | Status: AC
Start: 2023-03-16 — End: ?

## 2023-03-16 NOTE — Progress Notes (Addendum)
 [facility] outpatient visit:    HPI:  88 y.o. yo 2 referred to our office for neurogenic bladder SP tube care s/p meningitis 06/2013.   PMH significant for COPD, asthma, testicular cancer s/p left orchiectomy, prediabetes, HTN, HLD.    Patient lives with daughter, he is legally deaf and legally blind. His daughter [name] has supplemented much of the history. She is his primary care taker. Patient previously lived full time in [place] and has since moved to the area.     7/2024Leeroy Bock PA  Patient presents for 1 year follow-up  Continues with chronic SP tube management, exchanged by his daughter and by [facility] in home.   Cath changes occur every 3 weeks without issue.  Reports good urine output, clear color   Very minimal heme noted on occasion which clears up within a few hours   Notes moderate sediment concerns which they continue use of renacindin daily  Denies any recent infection concerns  Denies any fever, chills, abdominal/flank pain, gross hematuria    Prior CT imaging shows normal urologic findings  Patient reports prior history of elevated PSA with with NEG TRUS bx x2, 10-15 years ago.   No longer requires PSA testing        Today 03/16/23:  Patient was last seen 08/2022 where he was stable on SP care/exchanges in home by daughter and lifetime nursing care with renacidin instillations daily.     Today he feels fine. He is still getting spt exchange q3 weeks. Daughter regularly exchanges SPT. She does 2 bladder washes daily with renacidin irrigation nightly.     SPT inserted 03/11/22.     Drinks water, 80 oz daily.    Denies hematuria, fevers, chills, nausea/vomiting.     Non smoker  No family history of prostate, bladder, kidney, testicular cancers.   Allergies   Allergen Reactions    Peanut-Derived      Throat swells      Bactrim [Sulfamethoxazole-Trimethoprim] Other (See Comments)     Confusion, shakiness    No Known Drug Allergy      Created by Conversion - 0;        Past Medical History:    Diagnosis Date    Asthma     Cerebral artery occlusion with cerebral infarction     COPD (chronic obstructive pulmonary disease)     COPD (chronic obstructive pulmonary disease)     Hyperlipidemia     Hypertension     Meningitis     Testicle cancer        Past Surgical History:   Procedure Laterality Date    APPENDECTOMY      COCHLEAR IMPLANT      left ear    EYE SURGERY      PROSTATE SURGERY      superpubic cath      TESTICLE REMOVAL      TONSILLECTOMY AND ADENOIDECTOMY         Current Outpatient Medications   Medication Sig    Cranberry-Vitamin C-D Mannose 250-30-50 MG CHEW Take 2 capsules by mouth daily. PLAIN D MANNOSE    Citric Ac-Gluconolact-Mg Carb (RENACIDIN) 30 MLS BY INTRACATHETER ROUTE DAILY.    sterile water irrigation Irrigate/flush  suprapubic catheter daily or as needed to prevent clogging    omeprazole (PRILOSEC) 20 mg capsule Take 1 capsule (20 mg total) by mouth daily (before breakfast).    furosemide (LASIX) 40 mg tablet Take 1 tablet (40 mg total) by mouth every morning.  calcium carbonate 250 mg/mL suspension Take 5 mLs (1,250 mg total) by mouth 2 times daily (with meals).    magnesium gluconate (MAGONATE) 500 MG tablet Take 1 tablet (500 mg total) by mouth 2 times daily.    Lactobacillus (ACIDOPHILUS) 100 MG CAPS Take 100 capsules by mouth daily    amLODIPine (NORVASC) 5 MG tablet Take 1 tablet (5 mg total) by mouth daily.    citalopram (CELEXA) 20 MG tablet Take 1 tablet (20 mg total) by mouth daily.    Melatonin 10 MG CAPS Take 10 mg by mouth daily    traZODone (DESYREL) 50 MG tablet Take 1 tablet (50 mg total) by mouth nightly.    atorvastatin (LIPITOR) 10 MG tablet        Family History   Problem Relation Age of Onset    Heart Disease Father        Social History     Socioeconomic History    Marital status: Widowed   Tobacco Use    Smoking status: Former     Packs/day: 1.00     Years: 10.00     Additional pack years: 0.00     Total pack years: 10.00     Types: Cigarettes     Quit date:  11/17/1965     Years since quitting: 57.3    Smokeless tobacco: Never   Substance and Sexual Activity    Alcohol use: Yes     Alcohol/week: 1.0 standard drink of alcohol     Types: 1 Cans of beer per week    Drug use: Never    Sexual activity: Not Currently     Partners: Female       Physical Exam:  Vitals:    03/16/23 1335   Weight: 90.7 kg (200 lb)   Height: 1.549 m (5\' 1" )     Limited exam, completed via video  GENERAL:  Deaf/leagally blind. No acute distress, well developed, well nourished. Wheelchair bound, unable to ambulate.    NEUROLOGIC:  Oriented to person, place, time, and situation.  PSYCHIATRIC:  Normal mood and affect.  HEENT:  Normocephalic, atraumatic.  SKIN:  Normal color, turgor, texture, hydration.  RESPIRATORY:  Respirations unlabored.      Labs:  No results found for this or any previous visit (from the past 24 hours).    Lab Results   Component Value Date    PSAR3 2.81 10/31/2010    PSAR3 3.01 10/24/2009        Imaging:  10/03/2021-CT abd/pelvis W contrast:   FINDINGS:  Chest Base: Trace pericardial effusion. Coronary calcifications.    Liver/Biliary Tract: The nodular morphology is better appreciated on the prior ultrasound study.    Pancreas: Unremarkable.    Spleen: Unremarkable.    Adrenals: Unremarkable.    Kidneys and Collecting Systems: Unremarkable.    Lymph Nodes: No lymphadenopathy.    Vessels: Mild atherosclerotic disease.    GI Tract/Mesentery and Peritoneal Cavity: Small hiatal hernia. Colonic diverticulosis and no evidence of diverticulitis.    Visualized Reproductive Organs: Heterogeneous and massively enlarged prostate with coarse calcifications.    Bladder: Suprapubic catheter in place.    Soft Tissues/Musculoskeletal: Small collection at the right greater trochanter. No acute osseous abnormalities.    Impression   The nodular morphology is better appreciated on the prior ultrasound study. If cirrhosis remains of clinical concern, consider further evaluation with ultrasound  elastography. No biliary ductal dilation.    Heterogeneous and massively enlarged prostate. Recommend correlation with PSA levels.  Small collection at the right greater trochanter likely representing trochanteric bursitis.    Small hiatal hernia.     3//2022-Renal US:  FINDINGS:   Right Kidney:  Measures 11.2 cm.   Parenchyma:  Normal homogeneous echotexture.   Hydronephrosis:  None.   Calculi:  None.   Focal Lesions:  None.     Left Kidney:  Measures 10.0 cm.   Parenchyma:  Normal homogeneous echotexture.   Hydronephrosis:  None.   Calculi:  None.   Focal Lesions:  None.     Aorta:  Non-aneurysmal.     Inferior Vena Cava:  Patent.     Bladder:  Foley catheter visualized.   Jets:  Bilateral ureteral jets are documented.   Prevoid Volume: Not measured.   Postvoid Residual Volume:  Not measured.     Prostate:  Not visualized.     Miscellaneous:  None.       Impression   Normal renal ultrasound.        08/15/2018-Renal US:  FINDINGS:   Right Kidney:  Measures 10.8 cm.   Parenchyma:  Normal homogeneous echotexture.   Hydronephrosis:  Mild hydronephrosis.   Calculi:  None.   Focal Lesions:  None.     Left Kidney:  Measures 13.6 cm.   Parenchyma:  Normal homogeneous echotexture.   Hydronephrosis:  Mild hydronephrosis.   Calculi:  None.   Focal Lesions:  None.     Aorta:  Non-aneurysmal.     Inferior Vena Cava:  Patent.     Bladder:  Foley catheter in place.   Jets:  Bilateral ureteral jets are documented.   Prevoid Volume: Not measured.   Postvoid Residual Volume:  Not measured.     Prostate:  Enlarged measuring 25 cc..     Miscellaneous:  None.       Impression   Enlarged prostate.   Bilateral mild hydronephrosis        IMPRESSION: 88 y.o. male with neurogenic bladder with chronic urinary retention following meningitis in 2015, currently with chronic SP tube placement.  History of elevated PSA with negative TRUS biopsy x2.  [name], daughter and primary caretaker, wishes to take over SPT management and patient in  agreement.      1. Neurogenic bladder  Catheters (DOVER SILICONE FOLEY CATH 20FR) MISC    Incontinence Supplies (BARD IRRIGATION TRAY) KIT           PLAN:  SP care/exchange in home by patients daughter and will not need lifetime nursing care services anymore.     Continues with SP tube changes, erx 20 [place], every 3 weeks and bid+ prn flushing for obstruction  Continue Renacidin installations daily.   Encouraged adequate water intake.  PSA testing no longer required based on his age and health status.   Prior ct imaging shows normal urologic findings.     We will continue with yearly evaluations, they will call sooner with questions/concerns.       Corwin Levins, PA    2/11/20251:52 PM

## 2023-06-24 ENCOUNTER — Encounter: Payer: Self-pay | Admitting: Gastroenterology

## 2023-06-24 ENCOUNTER — Telehealth: Payer: Self-pay | Admitting: Urology

## 2023-06-24 DIAGNOSIS — N319 Neuromuscular dysfunction of bladder, unspecified: Secondary | ICD-10-CM

## 2023-06-24 NOTE — Telephone Encounter (Signed)
 Copied from CRM #[id number]. Topic: Return Call - Speak to Provider/Office Staff  >> Jun 24, 2023  2:33 PM Arvilla Birmingham wrote:      Cain Castillo - daughter, is returning a call from [place] and states this is regarding wanting to order catheter supplies for her father.    Was the appropriate expectation set with the patient for a time frame to receive a return call from the office? Yes    Writer called [place] back line and spoke to [place] who advised that Carolynne Citron was out of the office.    Writer was advised that Carolynne Citron would call back Cain Castillo - daughter.    Cain Castillo - daughter can be reached at:  [phone]

## 2023-06-24 NOTE — Telephone Encounter (Addendum)
 Called patients daughter, ok per HIPAA   Discussed need for catheter ordering      PLAN:  SP care/exchange in home by patients daughter and will not need lifetime nursing care services anymore.      Continues with SP tube changes, erx 20 [place], every 3 weeks and bid+ prn flushing for obstruction  Continue Renacidin  installations daily.   Encouraged adequate water  intake.  PSA testing no longer required based on his age and health status.   Prior ct imaging shows normal urologic findings.      We will continue with yearly evaluations, they will call sooner with questions/concerns.         Lyndee Saner, PA     2/11/20251:52 PM

## 2023-06-24 NOTE — Telephone Encounter (Signed)
 Copied from CRM #[id number]. Topic: Access to Care - Labs/Orders/Imaging  >> Jun 24, 2023 12:27 PM Arvilla Birmingham wrote:      Cain Castillo - daughter needs to have catheter supplies picked up from [facility] office, if possible.    Cain Castillo - daughter states that her father is completely out of catheter supplies and needs to pick up catheter supplies if possible.    Cain Castillo - daughter states she needs a catheter insertion tray; 20 Fr; bedside bag    Cain Castillo - daughter states that the supplier is [employer], but there is no information for that supplier on file.    Cain Castillo - daughter contact: [phone]

## 2023-06-24 NOTE — Telephone Encounter (Signed)
 Called and left message for return call  If patient calls back, please warm transfer to [place]

## 2023-06-25 NOTE — Telephone Encounter (Signed)
 Faxed catheter order to [facility]

## 2023-07-19 NOTE — Telephone Encounter (Signed)
 Can there be a prior auth or peer to peer with Medicare to approve catheter exchanges every 3 weeks?     OK per HIPPA. Spoke with Riley [name], she states they follow the plan:  PLAN:  SP care/exchange in home by patients daughter and will not need lifetime nursing care services anymore.      Continues with SP tube changes, erx 20 [place], every 3 weeks and bid+ prn flushing for obstruction  Continue Renacidin  installations daily.   Encouraged adequate water  intake.    The problem Riley [name] is having is that he is nonambulatory and he has had catheter exchanges every 3 weeks for 9 years. In spite of the care, he still clogs the catheter and requires exchanges every 3 weeks. Medicare is now allowing only monthly catheter exchanges.     Riley [name] states maybe it is the supplier that only allows 1 exchange per month?

## 2023-07-19 NOTE — Telephone Encounter (Signed)
 Copied from CRM [id number]. Topic: Access to Care - Labs/Orders/Imaging  >> Jul 19, 2023  9:43 AM Kordae Buonocore B wrote:  The patient,Riley [name] daughter, Cain Castillo is calling in regards to the patient's Catheter supplies at [employer] catheter suppliers.     Caller stated the insurance company isn't covering enough of the catheter supplies needed every 3 weeks verses every 4 weeks .    Caller stated if possible she can come to the [place] location or the [place] location Wednesday or Thursday.    Caller stated an appeal need to be sent to [facility] stating the patient need additional supplies due to clotting and urinary frequency.    Able to leave a message on voicemail: yes.      Please call the Cain Castillo back at [phone] to discuss.

## 2023-07-23 NOTE — Telephone Encounter (Signed)
 Called and spoke with patients daughter  She will come to [place] for supplies Monday

## 2023-07-23 NOTE — Telephone Encounter (Unsigned)
 Copied from CRM #[id number]. Topic: Access to Care - Speak to Provider/Office Staff  >> Jul 23, 2023 11:44 AM Shantese Raven J wrote:  Riley [name], daughter, is requesting to speak with nurse. Caller stated that this is regarding Catheter supplies. The patient is out supplies and she would like to know which location to pick up Catheter supplies [place] or [place]?    Riley [name] call the patient back at [phone] to discuss.

## 2023-08-19 ENCOUNTER — Telehealth: Payer: Self-pay | Admitting: Urology

## 2023-08-19 NOTE — Telephone Encounter (Signed)
 Called and spoke with Alfonso to discuss the appointment on Thursday, July 24th. Advised that patient is a 1 year follow up and he does not need to come in. Alfonso verbalized understanding.    While talking with Alfonso she stated that she is having difficulty with patients catheters clamping when she does the irrigation. Catheters leak while doing so. Patient has silicone catheters, would like to go back to latex.    Can we look into this?

## 2023-08-26 ENCOUNTER — Ambulatory Visit: Payer: Medicare (Managed Care) | Admitting: Urology

## 2023-11-08 ENCOUNTER — Encounter: Payer: Self-pay | Admitting: Urology

## 2023-11-12 ENCOUNTER — Encounter: Payer: Self-pay | Admitting: Internal Medicine

## 2023-11-24 ENCOUNTER — Other Ambulatory Visit: Payer: Self-pay

## 2023-11-24 ENCOUNTER — Other Ambulatory Visit
Admission: RE | Admit: 2023-11-24 | Discharge: 2023-11-24 | Disposition: A | Discharge status: Home / Self Care | Payer: Medicare (Managed Care) | Source: Ambulatory Visit | Attending: Family Medicine | Admitting: Family Medicine

## 2023-11-24 ENCOUNTER — Other Ambulatory Visit: Payer: Self-pay | Admitting: Family Medicine

## 2023-11-24 ENCOUNTER — Ambulatory Visit
Admission: RE | Admit: 2023-11-24 | Discharge: 2023-11-24 | Disposition: A | Discharge status: Home / Self Care | Payer: Medicare (Managed Care) | Source: Ambulatory Visit | Attending: Family Medicine | Admitting: Family Medicine

## 2023-11-24 DIAGNOSIS — I10 Essential (primary) hypertension: Secondary | ICD-10-CM | POA: Insufficient documentation

## 2023-11-24 DIAGNOSIS — R0601 Orthopnea: Secondary | ICD-10-CM | POA: Insufficient documentation

## 2023-11-24 DIAGNOSIS — E871 Hypo-osmolality and hyponatremia: Secondary | ICD-10-CM | POA: Insufficient documentation

## 2023-11-24 LAB — COMPREHENSIVE METABOLIC PANEL
ALT: 19 U/L (ref 0–50)
AST: 21 U/L (ref 0–50)
Albumin: 4.4 g/dL (ref 3.5–5.2)
Alk Phos: 95 U/L (ref 40–130)
Anion Gap: 12 (ref 7–16)
Bilirubin,Total: 0.2 mg/dL (ref 0.0–1.2)
CO2: 25 mmol/L (ref 20–28)
Calcium: 9 mg/dL (ref 8.6–10.2)
Chloride: 94 mmol/L — ABNORMAL LOW (ref 96–108)
Creatinine: 1.31 mg/dL — ABNORMAL HIGH (ref 0.67–1.17)
Glucose: 95 mg/dL (ref 60–99)
Lab: 24 mg/dL — ABNORMAL HIGH (ref 6–20)
Potassium: 4.4 mmol/L (ref 3.3–5.1)
Sodium: 131 mmol/L — ABNORMAL LOW (ref 133–145)
Total Protein: 6.8 g/dL (ref 6.3–7.7)
eGFR BY CREAT: 53 — AB

## 2023-11-24 LAB — LIPID PANEL
Chol/HDL Ratio: 2
Cholesterol: 170 mg/dL
HDL: 84 mg/dL — ABNORMAL HIGH (ref 40–60)
LDL Calculated: 72 mg/dL
Non HDL Cholesterol: 86 mg/dL
Triglycerides: 72 mg/dL

## 2023-11-24 LAB — NT-PRO BNP: NT-pro BNP: 101 pg/mL (ref 0–1800)

## 2023-12-08 ENCOUNTER — Other Ambulatory Visit
Admission: RE | Admit: 2023-12-08 | Discharge: 2023-12-08 | Disposition: A | Discharge status: Home / Self Care | Payer: Medicare (Managed Care) | Source: Ambulatory Visit | Attending: Family Medicine | Admitting: Family Medicine

## 2023-12-08 DIAGNOSIS — E871 Hypo-osmolality and hyponatremia: Secondary | ICD-10-CM | POA: Insufficient documentation

## 2023-12-08 LAB — COMPREHENSIVE METABOLIC PANEL
ALT: 27 U/L (ref 0–50)
AST: 27 U/L (ref 0–50)
Albumin: 4.3 g/dL (ref 3.5–5.2)
Alk Phos: 100 U/L (ref 40–130)
Anion Gap: 17 — ABNORMAL HIGH (ref 7–16)
Bilirubin,Total: 0.3 mg/dL (ref 0.0–1.2)
CO2: 23 mmol/L (ref 20–28)
Calcium: 9.2 mg/dL (ref 8.6–10.2)
Chloride: 93 mmol/L — ABNORMAL LOW (ref 96–108)
Creatinine: 1.27 mg/dL — ABNORMAL HIGH (ref 0.67–1.17)
Glucose: 87 mg/dL (ref 60–99)
Lab: 24 mg/dL — ABNORMAL HIGH (ref 6–20)
Potassium: 4.6 mmol/L (ref 3.3–5.1)
Sodium: 133 mmol/L (ref 133–145)
Total Protein: 6.6 g/dL (ref 6.3–7.7)
eGFR BY CREAT: 55 — AB

## 2023-12-20 ENCOUNTER — Other Ambulatory Visit
Admission: RE | Admit: 2023-12-20 | Discharge: 2023-12-20 | Disposition: A | Discharge status: Home / Self Care | Payer: Medicare (Managed Care) | Source: Ambulatory Visit | Attending: Urology | Admitting: Urology

## 2023-12-20 ENCOUNTER — Telehealth: Payer: Self-pay | Admitting: Urology

## 2023-12-20 DIAGNOSIS — R3989 Other symptoms and signs involving the genitourinary system: Secondary | ICD-10-CM | POA: Insufficient documentation

## 2023-12-20 LAB — URINALYSIS WITH REFLEX TO MICROSCOPIC
Glucose,UA: NEGATIVE
Ketones, UA: NEGATIVE
Nitrite,UA: POSITIVE — AB
Protein,UA: NEGATIVE
Specific Gravity,UA: 1.01 (ref 1.002–1.030)
pH,UA: 7.5 (ref 5.0–8.0)

## 2023-12-20 LAB — URINE MICROSCOPIC (IQ200)

## 2023-12-20 NOTE — Telephone Encounter (Signed)
 Daughter Alfonso calling symptomatic line and states she believes he has a UTI. She is noting decreased output, chills, some hallucinations. Denies pain, has chills but she is unsure if he has a fever. Some blood in the urine, bright red she did try to manipulate the catheter so she is not sure if this is what caused it. No constipation. Order placed for urine sample to rule out UTI.

## 2023-12-21 LAB — AEROBIC BACTERIAL URINE CULTURE: Aerobic bacterial urine culture: 0

## 2023-12-21 MED ORDER — LEVOFLOXACIN 500 MG PO TABS *I*: 500.0000 mg | ORAL_TABLET | Freq: Every day | ORAL | 0 refills | Status: DC

## 2023-12-21 NOTE — Telephone Encounter (Signed)
 Urine culture showed mixed flora, no definitive urinary tract infectionMay need further evaluation based on his reported symptomsChelsea B Joshua, PA

## 2023-12-21 NOTE — Telephone Encounter (Signed)
 Urinalysis is concerning for infection, based on patient's symptomatic status we can start treatment and adjust further based on culture results.Levaquin sent to the pharmacy.Patient to push fluids.Sherrie KATHEE Molt, PA  Lab results: 11/17/251100 Appearance,UR Clear Color, UA Yellow Glucose,UA NEG Ketones, UA NEG Specific Gravity,UA 1.010 Blood,UA 1+* pH,UA 7.5 Protein,UA NEG Nitrite,UA POS* Leuk Esterase,UA 3+* RBC,UA 0-2 WBC,UA 21-50*

## 2023-12-21 NOTE — Telephone Encounter (Signed)
 Called and spoke with patients daughter, AndreaOk per HIPAAShe states patient had previously tried Levaquin, and he struggled with nightmares and delusions Since patient is already experiencing hallucinations, she would prefer to await culture results Advised we would reach out once culture completes

## 2023-12-22 NOTE — Telephone Encounter (Signed)
 Sent MyChart message to check on patient status

## 2024-01-03 ENCOUNTER — Emergency Department: Payer: Medicare (Managed Care)

## 2024-01-03 ENCOUNTER — Other Ambulatory Visit: Payer: Self-pay

## 2024-01-03 ENCOUNTER — Inpatient Hospital Stay
Admission: EM | Admit: 2024-01-03 | Discharge: 2024-01-05 | DRG: 699 | Disposition: A | Discharge status: Skilled Nursing Facility | Payer: Medicare (Managed Care) | Source: Ambulatory Visit | Attending: Internal Medicine | Admitting: Internal Medicine

## 2024-01-03 DIAGNOSIS — Z8673 Personal history of transient ischemic attack (TIA), and cerebral infarction without residual deficits: Secondary | ICD-10-CM

## 2024-01-03 DIAGNOSIS — H9190 Unspecified hearing loss, unspecified ear: Secondary | ICD-10-CM | POA: Diagnosis present

## 2024-01-03 DIAGNOSIS — I509 Heart failure, unspecified: Secondary | ICD-10-CM | POA: Diagnosis present

## 2024-01-03 DIAGNOSIS — Z9049 Acquired absence of other specified parts of digestive tract: Secondary | ICD-10-CM

## 2024-01-03 DIAGNOSIS — F419 Anxiety disorder, unspecified: Secondary | ICD-10-CM | POA: Diagnosis present

## 2024-01-03 DIAGNOSIS — E871 Hypo-osmolality and hyponatremia: Secondary | ICD-10-CM | POA: Diagnosis present

## 2024-01-03 DIAGNOSIS — N400 Enlarged prostate without lower urinary tract symptoms: Secondary | ICD-10-CM | POA: Diagnosis present

## 2024-01-03 DIAGNOSIS — R972 Elevated prostate specific antigen [PSA]: Secondary | ICD-10-CM | POA: Diagnosis present

## 2024-01-03 DIAGNOSIS — R4182 Altered mental status, unspecified: Secondary | ICD-10-CM

## 2024-01-03 DIAGNOSIS — R531 Weakness: Secondary | ICD-10-CM | POA: Diagnosis present

## 2024-01-03 DIAGNOSIS — Z66 Do not resuscitate: Secondary | ICD-10-CM | POA: Diagnosis present

## 2024-01-03 DIAGNOSIS — Z87891 Personal history of nicotine dependence: Secondary | ICD-10-CM

## 2024-01-03 DIAGNOSIS — Z9621 Cochlear implant status: Secondary | ICD-10-CM

## 2024-01-03 DIAGNOSIS — T83511A Infection and inflammatory reaction due to indwelling urethral catheter, initial encounter: Principal | ICD-10-CM | POA: Diagnosis present

## 2024-01-03 DIAGNOSIS — E785 Hyperlipidemia, unspecified: Secondary | ICD-10-CM | POA: Diagnosis present

## 2024-01-03 DIAGNOSIS — I110 Hypertensive heart disease with heart failure: Secondary | ICD-10-CM | POA: Diagnosis present

## 2024-01-03 DIAGNOSIS — N390 Urinary tract infection, site not specified: Secondary | ICD-10-CM | POA: Diagnosis present

## 2024-01-03 DIAGNOSIS — Z5982 Transportation insecurity: Secondary | ICD-10-CM

## 2024-01-03 DIAGNOSIS — R443 Hallucinations, unspecified: Secondary | ICD-10-CM | POA: Diagnosis present

## 2024-01-03 DIAGNOSIS — G4700 Insomnia, unspecified: Secondary | ICD-10-CM | POA: Diagnosis present

## 2024-01-03 DIAGNOSIS — F32A Depression, unspecified: Secondary | ICD-10-CM | POA: Diagnosis present

## 2024-01-03 DIAGNOSIS — Z85820 Personal history of malignant melanoma of skin: Secondary | ICD-10-CM

## 2024-01-03 DIAGNOSIS — R9431 Abnormal electrocardiogram [ECG] [EKG]: Secondary | ICD-10-CM

## 2024-01-03 DIAGNOSIS — K219 Gastro-esophageal reflux disease without esophagitis: Secondary | ICD-10-CM | POA: Diagnosis present

## 2024-01-03 DIAGNOSIS — R1310 Dysphagia, unspecified: Secondary | ICD-10-CM | POA: Diagnosis present

## 2024-01-03 DIAGNOSIS — R627 Adult failure to thrive: Secondary | ICD-10-CM | POA: Diagnosis present

## 2024-01-03 DIAGNOSIS — N319 Neuromuscular dysfunction of bladder, unspecified: Secondary | ICD-10-CM | POA: Diagnosis present

## 2024-01-03 LAB — CBC AND DIFFERENTIAL
Baso # K/uL: 0 THOU/uL (ref 0.0–0.2)
Eos # K/uL: 0.1 THOU/uL (ref 0.0–0.5)
Hematocrit: 36 % — ABNORMAL LOW (ref 37–52)
Hemoglobin: 11.9 g/dL — ABNORMAL LOW (ref 12.0–17.0)
IMM Granulocytes #: 0.1 THOU/uL — ABNORMAL HIGH (ref 0–0)
IMM Granulocytes: 0.7 %
Lymph # K/uL: 1.7 THOU/uL (ref 1.0–5.0)
MCV: 89 fL (ref 75–100)
Mono # K/uL: 1.3 THOU/uL — ABNORMAL HIGH (ref 0.1–1.0)
Neut # K/uL: 5 THOU/uL (ref 1.5–6.5)
Nucl RBC # K/uL: 0 THOU/uL
Nucl RBC %: 0 /100{WBCs} (ref 0.0–0.2)
Platelets: 183 THOU/uL (ref 150–450)
RBC: 4 MIL/uL (ref 4.0–6.0)
RDW: 13.2 % (ref 0.0–15.0)
Seg Neut %: 62.3 %
WBC: 8.1 THOU/uL (ref 3.5–11.0)

## 2024-01-03 LAB — BASIC METABOLIC PROFILE, PLASMA
Anion Gap,PL: 9 (ref 7–16)
CO2,Plasma: 26 mmol/L (ref 20–28)
Calcium, PL: 8.7 mg/dL (ref 8.6–10.2)
Chloride,Plasma: 96 mmol/L (ref 96–108)
Creatinine: 1.14 mg/dL (ref 0.67–1.17)
Glucose,Plasma: 135 mg/dL — ABNORMAL HIGH (ref 60–99)
Potassium,Plasma: 4.3 mmol/L (ref 3.3–4.6)
Sodium,Plasma: 131 mmol/L — ABNORMAL LOW (ref 133–145)
UN,Plasma: 26 mg/dL — ABNORMAL HIGH (ref 6–20)
eGFR BY CREAT: 63

## 2024-01-03 LAB — COVID/INFLUENZA A & B/RSV NAAT (PCR)
COVID-19 NAAT (PCR): NEGATIVE
Influenza A NAAT (PCR): NEGATIVE
Influenza B NAAT (PCR): NEGATIVE
RSV NAAT (PCR): NEGATIVE

## 2024-01-03 LAB — URINALYSIS WITH REFLEX TO CULTURE
Glucose,UA: NEGATIVE
Ketones, UA: NEGATIVE
Nitrite,UA: POSITIVE — AB
Specific Gravity,UA: 1.01 (ref 1.002–1.030)
pH,UA: 8.5 — ABNORMAL HIGH (ref 5.0–8.0)

## 2024-01-03 LAB — HEPATIC FUNCTION PANEL
ALT: 26 U/L (ref 0–50)
AST: 23 U/L (ref 0–50)
Albumin: 3.9 g/dL (ref 3.5–5.2)
Alk Phos: 116 U/L (ref 40–130)
Bilirubin,Direct: <0.2 mg/dL (ref 0.0–0.3)
Bilirubin,Total: 0.3 mg/dL (ref 0.0–1.2)
Total Protein: 6.4 g/dL (ref 6.3–7.7)

## 2024-01-03 LAB — URINE MICROSCOPIC (IQ200)

## 2024-01-03 LAB — TSH: TSH: 2.41 u[IU]/mL (ref 0.27–4.20)

## 2024-01-03 LAB — MAGNESIUM: Magnesium: 1.7 mg/dL (ref 1.6–2.5)

## 2024-01-03 LAB — LIPASE: Lipase: 36 U/L (ref 13–60)

## 2024-01-03 LAB — PHOSPHORUS: Phosphorus: 2.8 mg/dL (ref 2.7–4.5)

## 2024-01-03 LAB — LACTATE, PLASMA: Lactate: 1.5 mmol/L (ref 0.5–2.2)

## 2024-01-03 MED ORDER — CEFTRIAXONE SODIUM 1 GM IV/IJ SOLR *WRAPPED*
1000.0000 mg | Freq: Once | INTRAMUSCULAR | Status: AC
  Administered 2024-01-03: 1000 mg via INTRAVENOUS
  Filled 2024-01-03: qty 10

## 2024-01-03 MED ORDER — AMLODIPINE BESYLATE 5 MG PO TABS *I*: 5.0000 mg | ORAL_TABLET | Freq: Every day | ORAL | Status: DC

## 2024-01-03 MED ORDER — MAGNESIUM-OXIDE 400 (241.3 MG) MG PO TABS *WRAPPED*
400.0000 mg | ORAL_TABLET | Freq: Every day | ORAL | Status: DC
  Administered 2024-01-03 – 2024-01-04 (×2): 400 mg via ORAL
  Filled 2024-01-03 (×2): qty 1

## 2024-01-03 MED ORDER — AMLODIPINE BESYLATE 5 MG PO TABS *I*
2.5000 mg | ORAL_TABLET | Freq: Every day | ORAL | Status: DC
  Administered 2024-01-03 – 2024-01-04 (×2): 2.5 mg via ORAL
  Filled 2024-01-03 (×2): qty 1

## 2024-01-03 MED ORDER — LOSARTAN POTASSIUM 25 MG PO TABS *I*
25.0000 mg | ORAL_TABLET | Freq: Every day | ORAL | Status: DC
  Administered 2024-01-04 – 2024-01-05 (×2): 25 mg via ORAL
  Filled 2024-01-03 (×2): qty 1

## 2024-01-03 MED ORDER — AMLODIPINE BESYLATE 5 MG PO TABS *I*: 2.5000 mg | ORAL_TABLET | Freq: Every day | ORAL | Status: DC

## 2024-01-03 MED ORDER — TRAZODONE HCL 50 MG PO TABS *I*
100.0000 mg | ORAL_TABLET | Freq: Every evening | ORAL | Status: DC
  Administered 2024-01-03 – 2024-01-04 (×2): 100 mg via ORAL
  Filled 2024-01-03: qty 2
  Filled 2024-01-03: qty 1

## 2024-01-03 MED ORDER — CALCIUM CARBONATE ANTACID 500 MG PO CHEW *I*
250.0000 mg | CHEWABLE_TABLET | Freq: Two times a day (BID) | ORAL | Status: DC
  Administered 2024-01-03 – 2024-01-05 (×4): 250 mg via ORAL
  Filled 2024-01-03 (×4): qty 1

## 2024-01-03 MED ORDER — SODIUM CHLORIDE 0.9 % IV BOLUS *I*
500.0000 mL | Freq: Once | Status: AC
  Administered 2024-01-03: 500 mL via INTRAVENOUS

## 2024-01-03 MED ORDER — ATORVASTATIN CALCIUM 20 MG PO TABS *I*
10.0000 mg | ORAL_TABLET | Freq: Every day | ORAL | Status: DC
  Administered 2024-01-04 – 2024-01-05 (×2): 10 mg via ORAL
  Filled 2024-01-03 (×2): qty 1

## 2024-01-03 MED ORDER — ACETAMINOPHEN 325 MG PO TABS *I*
650.0000 mg | ORAL_TABLET | Freq: Four times a day (QID) | ORAL | Status: DC | PRN
  Administered 2024-01-03: 650 mg via ORAL
  Filled 2024-01-03: qty 2

## 2024-01-03 MED ORDER — STERILE WATER FOR IRRIGATION IR SOLN *I*
Freq: Every day | Status: DC
  Administered 2024-01-04 – 2024-01-05 (×2): 30 mL

## 2024-01-03 MED ORDER — CITALOPRAM HYDROBROMIDE 20 MG PO TABS *I*
20.0000 mg | ORAL_TABLET | Freq: Every evening | ORAL | Status: DC
  Administered 2024-01-03 – 2024-01-04 (×2): 20 mg via ORAL
  Filled 2024-01-03 (×2): qty 1

## 2024-01-03 MED ORDER — SODIUM CHLORIDE 0.9 % INJ (FLUSH) WRAPPED *I*
3.0000 mL | Freq: Two times a day (BID) | Status: DC
  Administered 2024-01-03 – 2024-01-05 (×4): 3 mL via INTRAVENOUS

## 2024-01-03 MED ORDER — SODIUM CHLORIDE 0.9 % IR SOLN *I*: Freq: Every day | Status: DC

## 2024-01-03 MED ORDER — CEFTRIAXONE SODIUM 1 GM IV/IJ SOLR *WRAPPED*
1000.0000 mg | INTRAMUSCULAR | Status: DC
  Administered 2024-01-04 – 2024-01-05 (×2): 1000 mg via INTRAVENOUS
  Filled 2024-01-03 (×2): qty 10

## 2024-01-03 MED ORDER — ACETAMINOPHEN 650 MG RE SUPP *I*: 650.0000 mg | Freq: Four times a day (QID) | RECTAL | Status: DC | PRN

## 2024-01-03 MED ORDER — ENOXAPARIN SODIUM 40 MG/0.4ML IJ SOSY *I*
40.0000 mg | PREFILLED_SYRINGE | Freq: Every day | INTRAMUSCULAR | Status: DC
  Administered 2024-01-03 – 2024-01-04 (×2): 40 mg via SUBCUTANEOUS
  Filled 2024-01-03 (×2): qty 0.4

## 2024-01-03 MED ORDER — CITALOPRAM HYDROBROMIDE 20 MG PO TABS *I*: 20.0000 mg | ORAL_TABLET | Freq: Every day | ORAL | Status: DC

## 2024-01-03 MED ORDER — FUROSEMIDE 40 MG PO TABS *I*
40.0000 mg | ORAL_TABLET | Freq: Every morning | ORAL | Status: DC
  Administered 2024-01-04 – 2024-01-05 (×2): 40 mg via ORAL
  Filled 2024-01-03 (×2): qty 1

## 2024-01-03 MED ORDER — OMEPRAZOLE 20 MG PO CPDR *I*
20.0000 mg | DELAYED_RELEASE_CAPSULE | Freq: Every morning | ORAL | Status: DC
  Administered 2024-01-04 – 2024-01-05 (×2): 20 mg via ORAL
  Filled 2024-01-03 (×2): qty 1

## 2024-01-03 MED ORDER — LACTOBACILLUS PO CAPS *I*
1.0000 | ORAL_CAPSULE | Freq: Every day | ORAL | Status: DC
  Administered 2024-01-04 – 2024-01-05 (×2): 1 via ORAL
  Filled 2024-01-03 (×3): qty 1

## 2024-01-03 MED ORDER — MELATONIN 3 MG PO TABS *I*
6.0000 mg | ORAL_TABLET | Freq: Every day | ORAL | Status: DC
  Administered 2024-01-03 – 2024-01-04 (×2): 6 mg via ORAL
  Filled 2024-01-03 (×2): qty 2

## 2024-01-03 NOTE — H&P (Addendum)
 General H&P for InpatientsChief Complaint: Weakness Hallucinations History of Present Illness:HPIMr. Riley [name] is an 88 year old male with PMH of BPH, dysphagia, elevated PSA, melanoma, neurogenic bladder s/p SPC, meningitis in 2015, presenting with anxiety, hallucinations, and delusions. Patient stated that he has been having different problems for over a week. He stated that he has been having trouble swallowing and shortness of breath.He reports that he had meningitis about 10 years ago, end ever since he has experienced intermittent confusion, which has recently escalated to more frequent delusions and persistent hallucinations. He reports that he is completely blind. Per daughter/HCP Alfonso at bedside hallucinations include seeing fireflies, grass that wouldn't cut, and being in a pickup truck on a mattress. Despite increasing his trazodone dosage from 100 mg to 150 mg a week ago, his delusions and anxiety persist. This morning, he experienced significant shakiness and agitation, similar to what he felt last week. Although he initially declined a mental health evaluation, he later agreed to hospitalization, reporting that he feels mentally unwell and shaky.The patient also reports lower abdominal cramps without nausea or vomiting, a persistent cough, and occasional difficulty swallowing. His gastroenterologist appointment was canceled due to the doctor's medical leave, and his swallowing difficulty has worsened, leading him to believe he needs his throat stretched. He does not see a neurologist.He has neuropathy with cramping in his left calf and uses compression socks, which have slightly improved his edema. He keeps his feet elevated. A 20 French suprapubic catheter was replaced a week ago, and a urine sample taken last week due to suspected UTI returned normal results. His symptoms began before the urine sample was taken. He had an X-ray two weeks ago and a COVID-19 swab  today.The patient has been experiencing falls, which have limited his mobility to a wheelchair and prevented car travel. He fell while attempting to get into a car and fell and hit the back of his head a month ago, though he did not exhibit any concussion symptoms. Last BM was last night Past Medical History[1]Past Surgical History[2]Family History[3]Social History Socioeconomic History  Marital status: Widowed Tobacco Use  Smoking status: Former   Current packs/day: 0.00   Average packs/day: 1 pack/day for 10.0 years (10.0 ttl pk-yrs)   Types: Cigarettes   Start date: 11/18/1955   Quit date: 11/19/1965   Years since quitting: 58.1  Smokeless tobacco: Never  Tobacco comments:   The data in the grid above may be an average based on historical data and used for Lung Cancer Screening Eligibility.  If you find it inaccurate you can delete it and take a more accurate history. Substance and Sexual Activity  Alcohol use: Yes   Alcohol/week: 1.0 standard drink of alcohol   Types: 1 Cans of beer per week  Drug use: Never  Sexual activity: Not Currently   Partners: Female Allergies: Allergies[4](Not in a hospital admission) Current Facility-Administered Medications Medication Dose Route Frequency  cefTRIAXone (ROCEPHIN) 100 mg/ml injection 1,000 mg  1,000 mg Intravenous Once Current Outpatient Medications Medication  levoFLOXacin  (LEVAQUIN ) 500 mg tablet  Cranberry-Vitamin C-D Mannose 250-30-50 MG CHEW  Catheters (DOVER SILICONE FOLEY CATH 20FR) MISC  Incontinence Supplies (BARD IRRIGATION TRAY) KIT  Citric Ac-Gluconolact-Mg Carb (RENACIDIN )  sterile water  irrigation  omeprazole (PRILOSEC) 20 mg capsule  furosemide (LASIX) 40 mg tablet  calcium carbonate 250 mg/mL suspension  magnesium gluconate (MAGONATE) 500 MG tablet  Lactobacillus (ACIDOPHILUS) 100 MG CAPS  amLODIPine (NORVASC) 5 MG tablet  citalopram (CELEXA) 20 MG  tablet  Melatonin 10  MG CAPS  traZODone (DESYREL) 50 MG tablet  atorvastatin (LIPITOR) 10 MG tablet Review of Systems: Review of Systems Constitutional:  Negative for chills, fever, malaise/fatigue and weight loss. HENT:  Positive for hearing loss. Negative for ear discharge.       HOH  Eyes:  Negative for blurred vision and double vision.      Legally blind  Respiratory:  Positive for shortness of breath. Negative for cough, hemoptysis and wheezing.  Cardiovascular:  Positive for leg swelling. Negative for chest pain, palpitations and claudication. Gastrointestinal:  Negative for blood in stool, constipation, heartburn, nausea and vomiting. Genitourinary:  Negative for dysuria and urgency.      SPC - last change today  Musculoskeletal:  Positive for falls. Negative for back pain and myalgias. Skin:  Negative for rash. Neurological:  Positive for weakness. Negative for dizziness, tingling, sensory change and focal weakness. Psychiatric/Behavioral:  Positive for hallucinations. Negative for depression.  Last Nursing documented pain: 0-10  Pain Scale: 0 (01/03/24 0946)Patient's stated pain goal on admission:  Patient Vitals for the past 24 hrs: BP Temp Temp src Pulse Resp SpO2 Height Weight 01/03/24 0904 132/60 36.6 C (97.8 F) TEMPORAL 83 18 97 % 1.803 m (5' 11) 95.3 kg (210 lb)  Physical ExamConstitutional:     Appearance: Normal appearance. HENT:    Head: Normocephalic and atraumatic.    Nose: Nose normal.    Mouth/Throat:    Mouth: Mucous membranes are moist.    Pharynx: Oropharynx is clear. Eyes:    Conjunctiva/sclera: Conjunctivae normal.    Pupils: Pupils are equal, round, and reactive to light.    Comments: Legally blind  Cardiovascular:    Rate and Rhythm: Normal rate.    Heart sounds: No murmur heard.   No gallop. Pulmonary:    Effort: Pulmonary effort is normal. No respiratory distress.     Breath sounds: No wheezing or rhonchi. Abdominal:    General: There is distension.    Palpations: Abdomen is soft.    Tenderness: There is no abdominal tenderness. There is no guarding. Genitourinary:   Comments: SPC present Musculoskeletal:       General: Normal range of motion.    Cervical back: Normal range of motion and neck supple. Skin:   General: Skin is warm and dry.    Capillary Refill: Capillary refill takes less than 2 seconds.    Coloration: Skin is not jaundiced. Neurological:    Mental Status: He is alert. Mental status is at baseline. Psychiatric:       Mood and Affect: Mood normal. Lab Results: All labs in the last 72 hours:Recent Results (from the past 72 hours) Phosphorus  Collection Time: 01/03/24  9:44 AM Result Value Ref Range  Phosphorus 2.8 2.7 - 4.5 mg/dL Magnesium  Collection Time: 01/03/24  9:44 AM Result Value Ref Range  Magnesium 1.7 1.6 - 2.5 mg/dL Hepatic function panel  Collection Time: 01/03/24  9:44 AM Result Value Ref Range  Total Protein 6.4 6.3 - 7.7 g/dL  Albumin 3.9 3.5 - 5.2 g/dL  Bilirubin,Total 0.3 0.0 - 1.2 mg/dL  Bili,Indirect See below 0.1 - 1.0 mg/dL  Bilirubin,Direct <9.7 0.0 - 0.3 mg/dL  Alk Phos 883 40 - 869 U/L  AST 23 0 - 50 U/L  ALT 26 0 - 50 U/L Lipase  Collection Time: 01/03/24  9:44 AM Result Value Ref Range  Lipase 36 13 - 60 U/L TSH  Collection Time: 01/03/24  9:44 AM Result Value Ref Range  TSH 2.41 0.27 -  4.20 uIU/mL Javonda Suh metabolic profile, Plasma  Collection Time: 01/03/24  9:44 AM Result Value Ref Range  Potassium,Plasma 4.3 3.3 - 4.6 mmol/L  Sodium,Plasma 131 (L) 133 - 145 mmol/L  Anion Gap,PL 9 7 - 16  UN,Plasma 26 (H) 6 - 20 mg/dL  Creatinine 8.85 9.32 - 1.17 mg/dL  eGFR BY CREAT 63   Glucose,Plasma 135 (H) 60 - 99 mg/dL  Calcium, PL 8.7 8.6 - 10.2 mg/dL  Chloride,Plasma 96 96 - 108 mmol/L  CO2,Plasma 26 20 - 28 mmol/L CBC and  differential  Collection Time: 01/03/24  9:44 AM Result Value Ref Range  WBC 8.1 3.5 - 11.0 THOU/uL  RBC 4.0 4.0 - 6.0 MIL/uL  Hemoglobin 11.9 (L) 12.0 - 17.0 g/dL  Hematocrit 36 (L) 37 - 52 %  MCV 89 75 - 100 fL  RDW 13.2 0.0 - 15.0 %  Platelets 183 150 - 450 THOU/uL  Seg Neut % 62.3 %  Neut # K/uL 5.0 1.5 - 6.5 THOU/uL  Lymph # K/uL 1.7 1.0 - 5.0 THOU/uL  Mono # K/uL 1.3 (H) 0.1 - 1.0 THOU/uL  Eos # K/uL 0.1 0.0 - 0.5 THOU/uL  Baso # K/uL 0.0 0.0 - 0.2 THOU/uL  Nucl RBC % 0.0 0.0 - 0.2 /100 WBC  Nucl RBC # K/uL 0.0 THOU/uL  IMM Granulocytes # 0.1 (H) 0 - 0 THOU/uL  IMM Granulocytes 0.7 % COVID/Influenza A & B/RSV NAAT (PCR)  Collection Time: 01/03/24  9:45 AM Result Value Ref Range  COVID-19 Source  Nasopharyngeal   COVID-19 NAAT (PCR) NEGATIVE Negative  Influenza A NAAT (PCR) NEGATIVE Negative  Influenza B NAAT (PCR) NEGATIVE Negative  RSV NAAT (PCR) NEGATIVE Negative Lactate, plasma  Collection Time: 01/03/24  9:58 AM Result Value Ref Range  Lactate 1.5 0.5 - 2.2 mmol/L Radiology Impressions (last 3 days):No results found.Currently Active/Followed Hospital Problems:There are no active hospital problems to display for this patient.Active diagnoses for this hospitalization Weakness Hallucinations Difficulty swallowing Assessment: this is an 88 year old gentleman with PMH notable for BPH, dysphagia, elevated PSA, melanoma, neurogenic bladder s/p SPC, meningitis in 2015, presenting with anxiety, hallucinations, and delusions.DDx: UTI, pyelonephritis, acute kidney injury, TIA, stroke, metabolic encephalopathy, dehydration, electrolyte derailment Plan: UTI vs aspiration PNA/hallucinations/FTT/weakness/difficulty swallowing:- CT head with no acute intracranial abnormality - Chest xray showing bilateral whitening - Mild hyponitremia 131,  liver profile unremarkable, no leukocytosis, HH stable - UA pending, SPC  changed in ED  - Started on Ceftriaxone 1 g IV daily - Added allergy to Cipro as per family request - Follow UA - SLP swallow evaluation - Continue decreased home Trazodone 100 mg nightly - per family report recent increase to 150 mg did not help with hallucinations - Decrease tetters- SPC irrigation daily - Follow blood cx/CMP/CBC2.  Depression/insomnia/hallucinations: - Home Celexa HS - Melatonin 6 mg HS- Continue decreased Trazodone 100 mg HS 3.  HTN/CHF:- Continue home amlodipine - Continue home Lasix - 1800 cc UO this am 4. GERD: - Home omeprazole - Home lactobacillus - Miralax PRN 5. Hyperlipidemia: - Home atorvastatin 6. Diet: Minced and moist with mild liquid for now, awaiting SLP CODE: DNR/DNI, MOLST form completed with daughter/HCP Alfonso I personally spent 90  total minutes in this admission, including pre and post work.  Author: Hermelinda Lines, NP  Note created: 01/03/2024  at: 11:15 AM [1] Past Medical History:Diagnosis Date  Asthma   Cerebral artery occlusion with cerebral infarction   COPD (chronic obstructive pulmonary disease)   COPD (chronic obstructive pulmonary  disease)   Hyperlipidemia   Hypertension   Meningitis   Testicle cancer  [2] Past Surgical History:Procedure Laterality Date  APPENDECTOMY    COCHLEAR IMPLANT    left ear  EYE SURGERY    PROSTATE SURGERY    superpubic cath    TESTICLE REMOVAL    TONSILLECTOMY AND ADENOIDECTOMY   [3] Family HistoryProblem Relation Name Age of Onset  Heart Disease Father   [4] AllergiesAllergen Reactions  Peanut-Derived    Throat swells  Bactrim [Sulfamethoxazole-Trimethoprim] Other (See Comments)   Confusion, shakiness  No Known Drug Allergy    Created by Conversion - 0;

## 2024-01-03 NOTE — Progress Notes (Signed)
 Pharmacist Prior-to-Admission Medication History Review and Medication Fall Risk Assessment I reviewed the medication history that was performed by Damien Going CPhT RPhT, and the list below reflects the best possible prior-to-admission medication list. Sources of information:  Outside Meds Reconciliation (DrFirst);Medical Record Review;Pharmacy;Family Patient's pharmacy:  [facility] Medication history performed by: Damien Going CPhT RPhT Comments from medication history technician: Damien reviewed medications with daughter via phone call at [phone]. Daughter was a good historian and a pleasure to speak with. Added:LosartanRemoved (patient no longer taking):LevofloxacinDose clarified and adjusted: Amlodipine reduced from 5 mg to 2.5 mg dailyCalcium carbonate suspension reduced from twice daily to dailyLactobacillus reduced from 100 capsules to 1 capsule dailyMagnesium reduced from twice daily to dailyMelatonin reduced from 10 mg to 6 mg nightlyTrazodone changed from 50 mg to 100 mgMedication Fall Risk Assessment: The following medications on Riley [name]'s prior-to-admission medication list have been identified as having potential to increase the risk of falls. These agents should be reviewed with caution, and clinicians should consider avoiding or minimizing their use in patients at risk to fall.High Risk Medication Rationale losartan hypotension furosemide Hypotension, dehydration amlodipine hypotension citalopram drowsiness melatonin drowsiness trazodone drowsiness Current Outpatient Medications Medication Sig  losartan 25 mg tablet Take 1 tablet (25 mg total) by mouth daily.  Cranberry-Vitamin C-D Mannose 250-30-50 MG CHEW Take 2 capsules by mouth daily (with dinner). PLAIN D MANNOSE  Citric Ac-Gluconolact-Mg Carb (RENACIDIN ) 30 mLs by Intracatheter route daily for Urinary Catheter Irrigation.  sterile water  irrigation Irrigate/flush   suprapubic catheter daily or as needed to prevent clogging  omeprazole (PRILOSEC) 20 mg capsule Take 1 capsule (20 mg total) by mouth daily (before breakfast).  furosemide (LASIX) 40 mg tablet Take 1 tablet (40 mg total) by mouth every morning.  calcium carbonate 250 mg/mL suspension Take 5 mLs (1,250 mg total) by mouth daily.  magnesium gluconate (MAGONATE) 500 MG tablet Take 1 tablet (500 mg total) by mouth daily (with dinner).  Lactobacillus (ACIDOPHILUS) 100 MG CAPS Take 1 capsule by mouth daily.  amLODIPine (NORVASC) 5 MG tablet Take 0.5 tablets (2.5 mg total) by mouth daily.  citalopram (CELEXA) 20 MG tablet Take 1 tablet (20 mg total) by mouth nightly.  Melatonin 3 MG CAPS Take 6 mg by mouth nightly.  traZODone (DESYREL) 50 MG tablet Take 2 tablets (100 mg total) by mouth nightly.  atorvastatin (LIPITOR) 10 MG tablet Take 1 tablet (10 mg total) by mouth nightly.  Catheters (DOVER SILICONE FOLEY CATH 20FR) MISC By 1 Product no specified route Q 3 weeks for 20 French catheter foley kit with bedside bags for SPT exchanges q3weeks indefinitely for chronic urinary retention.  Incontinence Supplies (BARD IRRIGATION TRAY) KIT By 1,000 mLs no specified route 2 times daily as needed (for bladder irrigation) for Bladder irrigation tray kit for sediment due to chronic SPT, irrigate 30 mL water , twice per day, dispense 2L per month.  Greig IVAR Mares, PharmD Clinical Pharmacist, [facility] [address] [place], [place] [address]E: [phone] [email or web address]

## 2024-01-03 NOTE — ED Triage Notes (Signed)
 Pt presents to the ED at this time with report of pt having increasing weakness and waking up with worsening confusion every day. Per EMS, pt recently had and increase to his Trazadone from 100mg  to 150mg .

## 2024-01-03 NOTE — ED Provider Notes (Signed)
 History Chief Complaint Patient presents with  Weakness  Hallucinations History provided by:  PatientHistory of Present IllnessThe patient is an 88 year old male with a history of meningitis in 2015, presenting with anxiety, hallucinations, and delusions. He is accompanied by his daughter. Since his meningitis diagnosis, he has experienced intermittent confusion, which has recently escalated to more frequent delusions and persistent hallucinations. These hallucinations include seeing fireflies, grass that wouldn't cut, and being in a pickup truck on a mattress. Despite increasing his trazodone  dosage from 100 mg to 150 mg a week ago, his delusions and anxiety persist. This morning, he experienced significant shakiness and agitation, similar to what he felt last week. Although he initially declined a mental health evaluation, he later agreed to hospitalization, reporting that he feels mentally unwell and shaky.The patient also reports lower abdominal cramps without nausea or vomiting, a persistent cough, and occasional difficulty swallowing. His gastroenterologist appointment was canceled due to the doctor's medical leave, and his swallowing difficulty has worsened, leading him to believe he needs his throat stretched. He does not see a neurologist.He has neuropathy with cramping in his left calf and uses compression socks, which have slightly improved his edema. He keeps his feet elevated. A 20 French suprapubic catheter was replaced a week ago, and a urine sample taken last week due to suspected UTI returned normal results. His symptoms began before the urine sample was taken. He had an X-ray two weeks ago and a COVID-19 swab today.The patient has been experiencing falls, which have limited his mobility to a wheelchair and prevented car travel. He recently fell while attempting to get into a car and fell and hit the back of his head a month ago, though he did not exhibit any  concussion symptoms. Medical/Surgical/Family History Past Medical History[1] Patient Active Problem List Diagnosis Code  Benign Prostatic Hyperplasia 600  BPH (benign prostatic hyperplasia) N40.0  Elevated PSA R97.20  Melanoma C43.9  Neurogenic bladder N31.9  Dysphagia, unspecified type R13.10  Past Surgical History[2] Social History[3]  Review of SystemsPhysical Exam Triage VitalsTriage Start: Start, (01/03/24 0900)  First Recorded  ,    .Physical ExamConstitutional:     General: He is not in acute distress.   Appearance: Normal appearance. He is not ill-appearing. Pulmonary:    Effort: Pulmonary effort is normal. Abdominal:    General: There is no distension. Neurological:    Mental Status: He is alert. Medical Decision Making Patient seen by me on:  12/1/2025Assessment:  88 y.o. male with above history presenting with subacute on chronic worsening of delusions and hallucinations, daughter states that she is unable to care for him at home.  Will need to further rule out structural brain abnormalities, metabolic joint/infections with CT head, blood work, UA, chest x-ray.  Will provide gentle fluids.Differential diagnosis:  Structural brain abnormalities, metabolic joint/infectionsPlan:  Orders Placed This Encounter    COVID/Influenza A & B/RSV NAAT (PCR)    UA with reflex to Microscopic UA and Reflex to Bacterial Culture    Blood culture    Blood culture    Aerobic bacterial urine culture    *Chest STANDARD single view    CT head without contrast    Phosphorus    Magnesium     Hepatic function panel    Lipase    TSH    Basic metabolic profile, Plasma    CBC and differential    Lactate, plasma    Lactate, plasma (CONDITIONAL)    EKG 12 lead (initial)    Insert peripheral  IV    sodium chloride  0.9 % bolus 500 mL    cefTRIAXone  (ROCEPHIN ) 100 mg/ml injection 1,000 mg  Independent interpretation of imaging: On my review,. No acute findings on CXR. No acute findings on CT Head.ED Course and Disposition:  Patient workup without acute findings, discussed with hospital medicine and patient admitted for further management.ED Course as of 01/10/24 0158 Mon Jan 03, 2024 1111 Resp: 58 Valencia Purpura, MD  [1] Past Medical History:Diagnosis Date  Asthma   Cerebral artery occlusion with cerebral infarction   COPD (chronic obstructive pulmonary disease)   COPD (chronic obstructive pulmonary disease)   Hyperlipidemia   Hypertension   Meningitis   Testicle cancer  [2] Past Surgical History:Procedure Laterality Date  APPENDECTOMY    COCHLEAR IMPLANT    left ear  EYE SURGERY    PROSTATE SURGERY    superpubic cath    TESTICLE REMOVAL    TONSILLECTOMY AND ADENOIDECTOMY   [3] Social HistoryTobacco Use  Smoking status: Former   Current packs/day: 0.00   Average packs/day: 1 pack/day for 10.0 years (10.0 ttl pk-yrs)   Types: Cigarettes   Start date: 11/18/1955   Quit date: 11/19/1965   Years since quitting: 58.1  Smokeless tobacco: Never  Tobacco comments:   The data in the grid above may be an average based on historical data and used for Lung Cancer Screening Eligibility.  If you find it inaccurate you can delete it and take a more accurate history. Substance Use Topics  Alcohol use: Yes   Alcohol/week: 1.0 standard drink of alcohol   Types: 1 Cans of beer per week  Drug use: Never  Purpura Valencia, MD12/08/25 0201

## 2024-01-04 LAB — EKG 12-LEAD
P: 31 deg
PR: 257 ms
QRS: 61 deg
QRSD: 129 ms
QT: 413 ms
QTc: 464 ms
Rate: 76 {beats}/min
T: 80 deg

## 2024-01-04 LAB — CBC AND DIFFERENTIAL
Baso # K/uL: 0 THOU/uL (ref 0.0–0.2)
Eos # K/uL: 0.1 THOU/uL (ref 0.0–0.5)
Hematocrit: 34 % — ABNORMAL LOW (ref 37–52)
Hemoglobin: 11.4 g/dL — ABNORMAL LOW (ref 12.0–17.0)
IMM Granulocytes #: 0.1 THOU/uL — ABNORMAL HIGH (ref 0–0)
IMM Granulocytes: 0.6 %
Lymph # K/uL: 2.1 THOU/uL (ref 1.0–5.0)
MCV: 89 fL (ref 75–100)
Mono # K/uL: 1.2 THOU/uL — ABNORMAL HIGH (ref 0.1–1.0)
Neut # K/uL: 5.1 THOU/uL (ref 1.5–6.5)
Nucl RBC # K/uL: 0 THOU/uL
Nucl RBC %: 0 /100{WBCs} (ref 0.0–0.2)
Platelets: 189 THOU/uL (ref 150–450)
RBC: 3.9 MIL/uL — ABNORMAL LOW (ref 4.0–6.0)
RDW: 13.1 % (ref 0.0–15.0)
Seg Neut %: 60 %
WBC: 8.6 THOU/uL (ref 3.5–11.0)

## 2024-01-04 LAB — COMPREHENSIVE METABOLIC PANEL
ALT: 26 U/L (ref 0–50)
AST: 23 U/L (ref 0–50)
Albumin: 3.7 g/dL (ref 3.5–5.2)
Alk Phos: 114 U/L (ref 40–130)
Anion Gap: 12 (ref 7–16)
Bilirubin,Total: 0.4 mg/dL (ref 0.0–1.2)
CO2: 23 mmol/L (ref 20–28)
Calcium: 8.4 mg/dL — ABNORMAL LOW (ref 8.6–10.2)
Chloride: 95 mmol/L — ABNORMAL LOW (ref 96–108)
Creatinine: 1.1 mg/dL (ref 0.67–1.17)
Glucose: 73 mg/dL (ref 60–99)
Lab: 19 mg/dL (ref 6–20)
Potassium: 4.2 mmol/L (ref 3.3–5.1)
Sodium: 130 mmol/L — ABNORMAL LOW (ref 133–145)
Total Protein: 5.9 g/dL — ABNORMAL LOW (ref 6.3–7.7)
eGFR BY CREAT: 65

## 2024-01-04 LAB — AEROBIC BACTERIAL URINE CULTURE: Aerobic bacterial urine culture: 0

## 2024-01-04 MED ORDER — SODIUM CHLORIDE 1 GM PO TABS *I*
1.0000 g | ORAL_TABLET | Freq: Every day | ORAL | Status: DC
  Administered 2024-01-04 – 2024-01-05 (×2): 1 g via ORAL
  Filled 2024-01-04 (×2): qty 1

## 2024-01-04 MED ORDER — SODIUM CHLORIDE 1 GM PO TABS *I*: 1.0000 g | ORAL_TABLET | Freq: Every day | ORAL | Status: DC

## 2024-01-04 MED ORDER — TRAMADOL HCL 50 MG PO TABS *I*
50.0000 mg | ORAL_TABLET | Freq: Once | ORAL | Status: AC
  Administered 2024-01-04: 50 mg via ORAL
  Filled 2024-01-04: qty 1

## 2024-01-04 NOTE — ED Notes (Signed)
 Took report from previous nurse.

## 2024-01-04 NOTE — Plan of Care (Signed)
 Problem: PT Goals by Discharge  Goal: Bed mobility: Patient will perform bed mobility with rails and the head of bed up with Stand by assist  within 3-5 Visits.  Outcome: Progressing towards goal  Goal: Transfers: Patient will complete sit to stand transfers using a rolling walker with Minimal assist of 1 within 3-5 Visits.  Outcome: Progressing towards goal  Goal: Ambulate: Patient will ambulate less than 50 feet using a rolling walker with Minimal assist of 2 within 3-5 Visits.  Outcome: Progressing towards goal

## 2024-01-04 NOTE — Progress Notes (Signed)
 APP Progress NoteInterval HistoryPatient had around 50 % of the breakfast this amFamily at bedside and reported Lg BM last night, normal consistencyHe is in a beer mood this am Patient and family excited about working with PT today Daughter reports last night episode of delusion this am about the guy who ws saying long words that lasted for about 15 min. Generally feeling better than yesterday Current Facility-Administered Medications Medication Dose Route Frequency  cefTRIAXone (ROCEPHIN) 100 mg/ml injection 1,000 mg  1,000 mg Intravenous Q24H  atorvastatin (LIPITOR) tablet 10 mg  10 mg Oral Daily  calcium carbonate (TUMS) chewable tablet 250 mg  250 mg Oral 2 times per day  sodium chloride  0.9% irrigation   Intracatheter Daily @ 1800  furosemide (LASIX) tablet 40 mg  40 mg Oral QAM  lactobacillus capsule 1 capsule  1 capsule Oral Daily  magnesium oxide (MAG-OX) tablet 400 mg  400 mg Oral Daily with dinner  melatonin tablet 6 mg  6 mg Oral Daily @ 2100  omeprazole (PriLOSEC) capsule 20 mg  20 mg Oral QAM  sterile water  irrigation   Irrigation Daily  traZODone (DESYREL) tablet 100 mg  100 mg Oral Nightly  normal saline 0.9 % flush 3 mL  3 mL Intravenous Q12H  enoxaparin (LOVENOX) injection 40 mg  40 mg Subcutaneous Daily @ 2100  acetaminophen (TYLENOL) tablet 650 mg  650 mg Oral Q6H PRN  losartan (COZAAR) tablet 25 mg  25 mg Oral Daily  amLODIPine (NORVASC) tablet 2.5 mg  2.5 mg Oral Daily  citalopram (CeleXA) tablet 20 mg  20 mg Oral Nightly Current Outpatient Medications Medication  losartan 25 mg tablet  Cranberry-Vitamin C-D Mannose 250-30-50 MG CHEW  Citric Ac-Gluconolact-Mg Carb (RENACIDIN )  sterile water  irrigation  omeprazole (PRILOSEC) 20 mg capsule  furosemide (LASIX) 40 mg tablet  calcium carbonate 250 mg/mL suspension  magnesium gluconate (MAGONATE) 500 MG tablet  Lactobacillus (ACIDOPHILUS) 100 MG CAPS   amLODIPine (NORVASC) 5 MG tablet  citalopram (CELEXA) 20 MG tablet  Melatonin 3 MG CAPS  traZODone (DESYREL) 50 MG tablet  atorvastatin (LIPITOR) 10 MG tablet  Catheters (DOVER SILICONE FOLEY CATH 20FR) MISC  Incontinence Supplies (BARD IRRIGATION TRAY) KIT Intake/Output last 3 shifts:I/O last 3 completed shifts:12/01 0700 - 12/02 0659In: 130 (1.4 mL/kg) [P.O.:100; Other:30]Out: 2420 (25.4 mL/kg) [Urine:2420 (1.1 mL/kg/hr)]Net: -2290Weight: 95.3 kg Intake/Output this shift:No intake/output data recorded.Physical Exam:Gen: Patient is in alert, conversational HENT: oral pharynx without lesions, Moist mucous membranesNeck: supple, no lymphadenopathy Lungs: Clear to auscultation bilaterally, no wheezes or rhonchi.Heart: RRR no murmurs, rubs, or gallops, pulses +2Abdomen: Soft, non distended, BS present, non tenderNeurological Examination: Awake and alert. Oriented to person, place, and time.  Fluent.  Comprehension intact.  Affect appropriate.Past medical history:I have reviewed and confirmed the past medical history in the chart.Medications: reviewed medication list in the chartAllergies: reviewed allergy section in the chartLab Results: Recent Results (from the past 48 hours) EKG 12 lead (initial)  Collection Time: 01/03/24  9:27 AM Result Value  Rate 76  PR 257  P 31  QRSD 129  QT 413  QTc 464  QRS 61  T 80 Phosphorus  Collection Time: 01/03/24  9:44 AM Result Value  Phosphorus 2.8 Magnesium  Collection Time: 01/03/24  9:44 AM Result Value  Magnesium 1.7 Hepatic function panel  Collection Time: 01/03/24  9:44 AM Result Value  Total Protein 6.4  Albumin 3.9  Bilirubin,Total 0.3  Bili,Indirect See below  Bilirubin,Direct <0.2  Alk Phos 116  AST 23  ALT 26  Lipase  Collection Time: 01/03/24  9:44 AM Result Value  Lipase 36 TSH  Collection Time: 01/03/24  9:44 AM Result Value   TSH 2.41 [place]ion Weatherholtz metabolic profile, Plasma  Collection Time: 01/03/24  9:44 AM Result Value  Potassium,Plasma 4.3  Sodium,Plasma 131 (L)  Anion Gap,PL 9  UN,Plasma 26 (H)  Creatinine 1.14  eGFR BY CREAT 63  Glucose,Plasma 135 (H)  Calcium, PL 8.7  Chloride,Plasma 96  CO2,Plasma 26 CBC and differential  Collection Time: 01/03/24  9:44 AM Result Value  WBC 8.1  RBC 4.0  Hemoglobin 11.9 (L)  Hematocrit 36 (L)  MCV 89  RDW 13.2  Platelets 183  Seg Neut % 62.3  Neut # K/uL 5.0  Lymph # K/uL 1.7  Mono # K/uL 1.3 (H)  Eos # K/uL 0.1  Baso # K/uL 0.0  Nucl RBC % 0.0  Nucl RBC # K/uL 0.0  IMM Granulocytes # 0.1 (H)  IMM Granulocytes 0.7 COVID/Influenza A & B/RSV NAAT (PCR)  Collection Time: 01/03/24  9:45 AM Result Value  COVID-19 Source  Nasopharyngeal  COVID-19 NAAT (PCR) NEGATIVE  Influenza A NAAT (PCR) NEGATIVE  Influenza B NAAT (PCR) NEGATIVE  RSV NAAT (PCR) NEGATIVE Blood culture  Collection Time: 01/03/24  9:58 AM  Specimen: Blood: aerobic/anaerobic; R ARM Result Value  Bacterial Blood Culture . Lactate, plasma  Collection Time: 01/03/24  9:58 AM Result Value  Lactate 1.5 Blood culture  Collection Time: 01/03/24 10:08 AM  Specimen: Blood: aerobic/anaerobic; L ANTECUBITAL Result Value  Bacterial Blood Culture . UA with reflex to Microscopic UA and Reflex to Bacterial Culture  Collection Time: 01/03/24 12:29 PM  Specimen: Urine (Clean catch, voided, midstream) Result Value  Color, UA Yellow  Appearance,UR Cloudy (!)  Specific Gravity,UA 1.010  Leuk Esterase,UA 3+ (!)  Nitrite,UA POS (!)  pH,UA 8.5 (H)  Protein,UA 1+ (!)  Glucose,UA NEG  Ketones, UA NEG  Blood,UA 3+ (!) Urine microscopic (iq200)  Collection Time: 01/03/24 12:29 PM Result Value  RBC,UA 3-10 (!)  WBC,UA 11-20 (!)  Bacteria,UA 4+ (!)  Squam Epithel,UA 1+ Aerobic bacterial urine culture  Collection Time:  01/03/24 12:29 PM  Specimen: Urine (Clean catch, voided, midstream); NO COLLECTION METHOD Result Value  Aerobic bacterial urine culture . CBC and differential  Collection Time: 01/04/24  6:20 AM Result Value  WBC 8.6  RBC 3.9 (L)  Hemoglobin 11.4 (L)  Hematocrit 34 (L)  MCV 89  RDW 13.1  Platelets 189  Seg Neut % 60.0  Neut # K/uL 5.1  Lymph # K/uL 2.1  Mono # K/uL 1.2 (H)  Eos # K/uL 0.1  Baso # K/uL 0.0  Nucl RBC % 0.0  Nucl RBC # K/uL 0.0  IMM Granulocytes # 0.1 (H)  IMM Granulocytes 0.6 Radiology: EKG 12 lead (initial)Result Date: 12/2/2025Sinus rhythm Prolonged PR interval Nonspecific  intraventricular conduction delay Probable  anterolateral infarct, old*Chest STANDARD single viewResult Date: 12/1/2025No definite consolidation. END OF IMPRESSION CT head without contrastResult Date: 12/1/2025Sequelae of left-sided cochlear implant placement with associated streak artifact which limits visibility of adjacent structures. No acute intracranial abnormality. Sequelae of mild chronic microvascular ischemic disease and moderate generalized parenchymal volume loss. END OF IMPRESSION Active Hospital Problems  Diagnosis  FTT (failure to thrive) in adult  Resolved Hospital Problems No resolved problems to display. Select and non all inclusive diagnoses present in this patient on admission that may contribute to increased disease severity:Active diagnoses for this hospitalizationWeakness Hallucinations Difficulty swallowing Assessment and Plan:Tom J Hruska is a 88  y.o. male with PMH of this is an 88 year old gentleman with PMH notable for BPH, dysphagia, elevated PSA, melanoma, neurogenic bladder s/p SPC, meningitis in 2015, presenting with anxiety, hallucinations, and delusions.  Plan: UTI vs aspiration PNA/hallucinations/FTT/weakness/difficulty swallowing:- CT head with no acute intracranial  abnormality - Chest xray showing bilateral whitening - Mild hyponitremia 131,  liver profile unremarkable, no leukocytosis, HH stable - UA pending, SPC changed in ED  - Started on Ceftriaxone 1 g IV daily - Added allergy to Cipro as per family request - Follow UA - SLP swallow evaluation - Continue decreased home Trazodone 100 mg nightly - per family report recent increase to 150 mg did not help with hallucinations - Decrease tetters- SPC irrigation daily - SPC changed on 01/03/24- Follow blood cx/CMP/CBC- PT today  2.  Depression/insomnia/hallucinations: - Home Celexa HS - Melatonin 6 mg HS- Continue decreased Trazodone 100 mg HS  3.  HTN/CHF:- Continue home amlodipine - Continue home Lasix - 1800 cc UO this am  4. GERD: - Home omeprazole - Home lactobacillus - Miralax PRN 5. Hyponatremia:- Re start home sodium tab 1 g daily  6. Hyperlipidemia: - Home atorvastatin  6. Diet: Minced and moist with mild liquid for now, awaiting SLP  CODE: DNR/DNI, MOLST form completed with daughter/HCP Alfonso Leeds Lezly Rumpf NPHospital Medicine12/03/2023, 7:17 AM

## 2024-01-04 NOTE — Progress Notes (Signed)
 01/04/24 0800 UM Patient Class Review Patient Class Review Inpatient

## 2024-01-04 NOTE — Progress Notes (Signed)
 Physical Therapy EvaluationDischarge recommendation:  Skilled Nursing Facility RehabEquipment recommendations upon discharge: NoneMobility recommendations for nursing while in hospital: 2A SPTPatient Name: Riley [name] of Birth: 11-13-1937MRN#: [id number] Admission Date: 12/1/2025Date of Service: 12/2/2025History of present illness: TYRIE PORZIO is a 88 y.o. male who was admitted with failure to thrive.Prior physical therapy history for above complaint within the last year: N/AAdmitting Diagnosis: Failure to thrive Past Medical History[1]Past Surgical History[2]Hospital Course:EKG 12 lead (initial)Result Date: 12/2/2025Sinus rhythm Prolonged PR interval Nonspecific  intraventricular conduction delay Probable  anterolateral infarct, old*Chest STANDARD single viewResult Date: 12/1/2025No definite consolidation. END OF IMPRESSION CT head without contrastResult Date: 12/1/2025Sequelae of left-sided cochlear implant placement with associated streak artifact which limits visibility of adjacent structures. No acute intracranial abnormality. Sequelae of mild chronic microvascular ischemic disease and moderate generalized parenchymal volume loss. END OF IMPRESSION SUBJECTIVEPt laying in bed and agreeable to participate in PT. OBJECTIVE/SOCIAL HISTORYHeight: 180.3 cm (5' 11)Weight: 95.3 kg (210 lb) PT Adult Assessment - 01/04/24 1320    Prior Living   Prior Living Situation Reported by patient   Lives With Family   Type of Home Two Story Home   # Steps to Enter Home 0   ramp  # Rails to [employer] Home 0   # Of Steps In Home 0   # Rails in Home 0   Location of Bedrooms 1st floor   Location of Bathrooms 1st floor   Bathroom Accessibility Accessible   Current Home Equipment Walker (rolling);Wheelchair (manual)    Prior Function Level  Prior Function Level Reported by patient   Transfers  Independent;Required assistance   Has required assistance the past week, prior to that he was independent  Transfer Devices rolling walker   Walking Household distances only;Used assistive device   Uses manual WC for mobility  Walking assistive devices used Rolling walker   Stair negotiation Unable    PT Tracking  PT TRACKING PT Assigned   Type of Session evaluation    Treatment Day  Treatment Day (HH / URR) 1    Precautions/Observations  Precautions used Yes   LDA Observation Catheter (Foley/Suprapubic/Condom catheter)   Was patient wearing a mask? No   PPE worn by clinical research associate Gloves   Fall Precautions Bed alarm engaged;General falls precautions;White board updated with appropriate mobility status    Vision   Current Vision Visual deficits   Visual History Legally blind    Cognition  Cognition Tested   Arousal/Alertness Appropriate responses to stimuli   Orientation Alert and oriented x3   Ability to Follow Instructions Follows simple commands without difficulty    UE Assessment  Assessment Focus Strength   Generalized weakness   LE Assessment  Assessment Focus Strength   Generalized weakness   Sensation  Sensation Not tested    Bed Mobility  Bed mobility Tested   Rolling 1 person assist;Minimum assist to right;Minimum assist to left   Supine to Sit 2 person assist;Moderate assist   Sit to Supine 2 person assist;Moderate assist    Transfers  Transfers Tested   Sit to Stand 2 person assist;Moderate   Stand to sit 2 person assist;Moderate   Transfer Assistive Device rolling walker    Mobility  Mobility Not tested (comment)   Additional comments --   Not able to safely attempt   Family/Caregiver Training`  Patient/Family/Caregiver training Yes   Patient training Discharge planning;Role of physical therapy in hospital and plan for evaluation and follow up;PT plan of care after evaluation;Call don't fall, purpose  of bed/chair  alarm, and recommendation for nursing assistance during all oob mobility    Balance  Balance Tested    PT AM-PAC Mobility  Turning over in bed? A Lot: Maximum/Moderate Assist   Moving from lying on back to sitting on the side of the bed? A Lot: Maximum/Moderate Assist   Moving to and from a bed to a chair? A Lot: Maximum/Moderate Assist   Sitting down on and standing up from a chair with arms? A Lot: Maximum/Moderate Assist   Need to walk in hospital room? Total/Unable: Total/Dependent Assist   Climbing 3 - 5 steps with a railing? Total/Unable: Total/Dependent Assist   Total Raw Score 10   Standardized Score - Calculated 32.29   % Functional Impairment - Calculated 77%    Assessment  Brief Assessment Appropriate for skilled therapy   Problem List Impaired UE strength;Impaired LE strength;Impaired endurance;Impaired balance;Visual deficit;Impaired transfers;Impaired functional status;Impaired ambulation;Impaired mobility;Impaired bed mobility   Patient / Family Goal Return to independent functioning   Overall Assessment Pt presents with significant UE and LE generalized weakness. Pt mostly uses manual WC at baseline for mobility but will occasionally use RW around the house. Pt requires assistance for all bed mobility and transfers. Pt has a significant fear of falling which results in pt being unsteady during sitting and standing. Pt is not safe to return home at this time and would benefit from short term rehab upon D/C.    Plan/Recommendation  PT Treatment Interventions Restorative PT   PT Frequency 2-3x/wk   PT Mobility Recommendations 2A SPT   PT Discharge Recommendations Skilled Nursing Facility Rehab   PT Discharge Equipment Recommended None   PT Assessment/Recommendations Reviewed With: Care coordinator;Patient   Next PT Visit Progress as able   PT needs to see patient prior to DC  No    Time Calculation  PT Timed Codes 0   PT  Untimed Codes 1   PT Total Treatment 1    Plan and Onset date  Plan of Care Date 01/04/24   Onset Date 01/03/24   Treatment Start Date 01/04/24    ASSESSMENT:Tom J Snarski is a 88 y.o. male who has been admitted to the hospital with failure to thrive.  He has been referred to PT for Transfer Training, Investment Banker, Operational, Loss Adjuster, Chartered, and Discharge Planning Recommendations. Patient presents to PT currently requiring 2A SPT.  Patient will benefit from skilled PT services to address above stated impairments with above stated interventions. Personal factors affecting treatment/recovery:Advanced ageRecent hospital admissionComorbidities affecting treatment/recovery for this admission (*see bolded below):Past Medical History[3]Clinical presentation:stablePatient complexity:  low level as indicated by above stability of condition, personal factors, environmental factors and comorbidities in addition to their impairments found on physical exam.PT Prognosis:fairPatient / Caregiver Understanding: fairLong term goals: PT Care Plan Notes   Plan of Care by Darlys Standing at 01/04/2024  2:20 PM   Author: Darlys Standing Author Type: PT STUDENT Filed: 01/04/2024  2:20 PM  Note Status: Cosign Needed Cosign: Cosign Required Date of Service: 01/04/2024  2:20 PM  Creation Time: 01/04/2024  2:20 PM      Editor: Darlys Standing (PT STUDENT)              Problem: PT Goals by DischargeGoal: Bed mobility: Patient will perform bed mobility with rails and the head of bed up with Stand by assist within 3-5 Visits.Outcome: Progressing towards goalGoal: Transfers: Patient will complete sit to stand transfers using a rolling walker with Minimal assist of 1 within 3-5 Visits.Outcome:  Progressing towards goalGoal: Ambulate: Patient will ambulate less than 50 feet using a rolling walker with Minimal assist  of 2 within 3-5 Visits.Outcome: Progressing towards goal  PLAN   (see flow sheet above for components of plan)Plan of Care Thank you for the referral.Safiya Girdler [1] Past Medical History:Diagnosis Date  Asthma   Cerebral artery occlusion with cerebral infarction   COPD (chronic obstructive pulmonary disease)   COPD (chronic obstructive pulmonary disease)   Hyperlipidemia   Hypertension   Meningitis   Testicle cancer  [2] Past Surgical History:Procedure Laterality Date  APPENDECTOMY    COCHLEAR IMPLANT    left ear  EYE SURGERY    PROSTATE SURGERY    superpubic cath    TESTICLE REMOVAL    TONSILLECTOMY AND ADENOIDECTOMY   [3] Past Medical History:Diagnosis Date  Asthma   Cerebral artery occlusion with cerebral infarction   COPD (chronic obstructive pulmonary disease)   COPD (chronic obstructive pulmonary disease)   Hyperlipidemia   Hypertension   Meningitis   Testicle cancer

## 2024-01-04 NOTE — ED Notes (Signed)
 Patient given breakfast tray. Patent assisted with set up and feeding.

## 2024-01-04 NOTE — Comprehensive Assessment (Signed)
 IMM form reviewed with patient's daughter, Alfonso. Alfonso verbalized understanding. Copy left with patient and signed copy in patient's chart.Social Work Comprehensive Assessment Contacts: Murel Pursifull, patientAndrea Rose, patient's daughterSW met with the patient at bedside to introduce self, explain role, and conduct a comprehensive assessment. The patient instructed SW to call his daughter to complete assessment and rehab packet.Housing Dynamic: The patient is a 88 year old male who resides in [place], [place] with his daughter, Sharod. They reside in a two story home with a ramp steps to enter. There is a full bathroom and bedroom located on the first floor. Post-discharge Transportation: Prior to admission, the patient was NOT independent with transportation. The patient relies on Alfonso for transportation.ADLs: At baseline, the patient is not independent with ADL's and does not ambulate. Per Alfonso, the patient is a minimum 1 person assist stand to wheelchair.Additional Services:  The patient is not a cytogeneticist.The patient is not a dialysis patient. The patient does not use oxygen at home. The patient was not previously enrolled in home care services. The patient does have a private pay aid that comes into the home 3 times a week to assist with showers. PT is recommending rehab, SNF packet completed with daughter. First choice is [facility]. No other SW needs identified at this time. Plan: SW/CM will continue to follow this patient and provide assistance when necessary.Paiden Cavell, Surveyor, Quantity  Adult Social Work Initial AssessmentDemographics:DemographicsCounty of Residence: [place]Marital status: WidowedEthnicity/Race: CaucasianPrimary Language: Art Therapist of another person?: No oneDo you have animals or pets at home?: YesType of Animals or Pets: 2 dogsRisk Factors:Risk FactorsRisk Factors: Adjustment to  Dx/Injury/IllnessAdvance Directive:Health Care Directives Screen (Also required for Hospice Patients)*Has patient (or family) completed any of the following? (select all that apply): MOLST, Health Care Proxy (HCP)MOLST available for inclusion in the chart?: YesDoes MOLST designate: DNR, DNIMOLST in chart?: Yes*Would they like to discuss any issues related to MOLST, DNR Order, HCP, Living Will, or POA?: No*Health Care Directive teaching done: YesPersonal Contacts/Support System: Patient Contacts   Name Emerg Phone Ph Type Prime phone Next of Kin Guardian Relation HCA/Proxy Role Caregiver after DC  Rose,Andrea  [phone] HM Mobile phone Yes  Daughter of Age     Contact InformationVerified / updated the following patient contact information with patient or designee in Contact(s) Form: *Each contact has Name, Relationship and Primary Phone Number, *Each contact has Care Giver After Discharge answered either Yes or No, *At least one contact is Emergency ContactHas Caregiver been notified?: YesMethod Caregiver Notified: In PersonProfessional Contacts/Support System:Care Team          Con Flavia Fuller, [place] PCP [facility], Family Medicine [phone]  Military History:Veteran Status Information  Did you, your spouse, or a family member serve in the military?: no    Living Situation:Living SituationLiving Situation: HomeLives With: ChildCan they assist patient after discharge?: [facility] of another person?: No oneHome Geography:Home GeographyType of Home: 2 Story home# Of Steps In Home:  (first floor set up)# Steps to Enter Home: 2 (ramp)Bedroom: First floorBathroom: First floor - fullUtilitites Working: YesPsychosocial:Palliative Care AssessmentPerson assessed: FamilyAlcohol Assessment:Alcohol Assessment (SBIRT)> 0 ETOH drinks in past month: NoSubstance  Abuse (Not including alcohol):SW Substance Abuse (not incl alcohol)Other Substance Abuse: NoOther Substance Abuse: No Baseline ADL functioning:Baseline ADL functioningTransfers: One-assistAmbulation: With assistanceAssistive Device: WheelchairBathing/Grooming: With assistanceMeal Prep: With assistanceAble to feed self?: Yes, With setupHousehold maintenance/chores:  (Daughter does)Able to drive?: No (blind)Dialysis:DialysisDoes Patient have Dialysis?: NoIncome Information:Income InformationVocational: RetiredIncome Situation: Social Security Retirement, Other (comment) (investments)Prescription  Coverage: and hasPharmacy [facility] Care Services:Home Care ServicesCurrent Home Equipment: Walker (rolling), Wheelchair (manual) Home Oxygen:SW Home oxygenDo you have home oxygen?: No Social Drivers of Health Tobacco Use: Medium Risk (11/18/2023)  Received from [facility]  Patient History   Smoking Tobacco Use: Former   Smokeless Tobacco Use: Never   Passive Exposure: Past Alcohol Use: Not At Risk (01/04/2024)  Alcohol Use   Frequency of Alcohol Consumption: 0: Never   Average Number of Drinks: 0: None, I do not drink   Frequency of Binge Drinking: 0: Never Financial Resource Strain: Low Risk  (05/25/2017)  [facility]  Overall Financial Resource Strain (CARDIA)   Difficulty of Paying Living Expenses: Not hard at all Food Insecurity: No Food Insecurity (01/04/2024)  Hunger Vital Sign   Worried About Running Out of Food in the Last Year: Never true   Ran Out of Food in the Last Year: Never true Transportation Needs: Unmet Transportation Needs (01/04/2024)  PRAPARE - Transportation   Lack of Transportation (Medical): Yes   Lack of Transportation (Non-Medical): Yes Physical Activity: Sufficiently Active (05/25/2017)  [facility] from [facility]  Exercise Vital Sign   Days of  Exercise per Week: 7 days   Minutes of Exercise per Session: 60 min Stress: No Stress Concern Present (05/25/2017)  [facility] from [facility] - Occupational Stress Questionnaire   Feeling of Stress : Only a little Social Connections: Moderately Isolated (05/25/2017)  [facility]  Social Connection and Isolation Panel   Frequency of Communication with Friends and Family: More than three times a week   Frequency of Social Gatherings with Friends and Family: More than three times a week   Attends Religious Services: Never   Database Administrator or Organizations: No   Attends Banker Meetings: Never   Marital Status: Widowed Intimate Partner Violence: Not At Risk (01/04/2024)  Intimate Partner Violence   Fear of Current or Ex-Partner: No Depression: Not at risk (06/26/2019)  [facility] from [facility] visits prior to 04/04/2022.  PHQ-2   SDOH PHQ2 SCORE: 0 Housing Stability: Low Risk (01/04/2024)  Housing Stability Vital Sign   Unable to Pay for Housing in the Last Year: No   Number of Times Moved in the Last Year: 0   Homeless in the Last Year: No Utilities: Not At Risk (01/04/2024)  [facility] Utilities   Threatened with loss of utilities: No Dajanee Voorheis, BSW 7:46 PM on 01/04/2024

## 2024-01-04 NOTE — Comprehensive Assessment (Signed)
 01/04/24 1945 Discharge Planning Living Situation Home Lives With Child Can they assist with pt needs after discharge? Yes Current Home Equipment Walker (rolling);Wheelchair (manual) Home Geography Type of Home 2 Story home # Of Steps In Home (first floor set up) # Steps to Enter Home 2(ramp) Bedroom First floor Bathroom First floor - full Utilitites Working Yes Communication Is communication necessary with patient spokesperson?  Yes Which patient spokesperson is being contacted? Family Who is communicating with patient spokesperson? SW Discharge Transportation Transportation Setup Complete SW Financial/Voucher Assistance for Transportation No

## 2024-01-04 NOTE — Continuity of Care (Signed)
 [place]  [facility] of Quality and Surveillance for Nursing Homes and ICFs/MRPatient Riley [name] MR [id number] Account Number [id number]  Hospital and Community                Patient Review Instrument(HC-PRI)           RUG II:  CC7 I. ADMINISTRATIVE DATA 1. Operating Certificate [id number] 2. Social Security [id number] 3. Official Name of Hospital Completing this [facility])  4A. Patient NameTom J [name] 10. Sexmale 4B. [place] 11A. Date of Hospital Admission or Initial Agency Visit12/02/2023 5. Date of PRI CompletionDecember 2, 2025 11B. Date of Alternate level of Care Status in Hospital (if applicable)  6. Account Number 12. Medicaid Number 7. Hospital Room NumberNMS-306/NMS-306-01 13. Medicare [id number] 8. Name of Unit/Division/BuildingNMS-306/NMS-306-01 14. Primary PayorAETNA MEDICARE 9. Date of [id number] 51. Reason for [facility] Completion1 - RHCF application from hospital II. MEDICAL EVENTS 16. Decubitus Level:      Location:  No reddened skin or breakdown   17. MEDICAL CONDITIONS During the past week:1=Yes  2=No 18. MEDICAL TREATMENTS 1=Yes  2=No A. Comatose No A. Tracheostomy Care/Suct No     B. Dehydration Yes B. Suctioning/General No     C. Internal Bleeding No C. Oxygen (Daily) No          D. Stasis Ulcer No D Respiratory Care (Daily) No     E. Terminally Ill No E. Nasal Gastric Feeding No     F. Contractures No F. Parenteral Feeding No     G. Diabetes Mellitus No G. Wound Care No     H. Urinary Tract Infection Yes H. Chemotherapy No     I. HIV Infection Symptomatic No I. Transfusion No     J. Accident No J. Dialysis No     K. Ventilator Dependent No K. Bowel and Bladder Rehab No       L. Catheter (Indwelling or External) Yes (suprapubic)          M. Physical  Restraints  (Daytime) No         Adapted from [id number]  Version: 002FReview Type: Update review12/2/20252 of 3NEW [facility]-Division of Quality and Surveillance for Nursing Homes and ICFs/MRPatient Riley [name] MR [id number] Account Number [id number]  Hospital and Community                Patient Review Instrument(HC-PRI)           III. Activities of Daily Living 19. Eating 3-Requires continual help (encouragement/teaching/physical assistance) with eating or meal will not be completed. (is blind)  20. Mobility 5-Is wheeled, chairfast or bedfast. Relies on someone else to move about, if at all.  21. Transfer 4-Requires two people to provide constant supervision and/or physically lift.  May need lifting equipment.  22. Toileting 3-Continent of bowel and bladder.  Requires constant supervision and/or physical assistance with major/all parts of the task, including appliances (i.e., colostomy, ilieostomy, urinary catheter).  IV. BEHAVIORS 23. Verbal Disruption No known history 24. Physical Agression No known history 25. Disruptive, Infantile or Socially Inappropriate Behavior No known history 26. Hallucinations No (none while in hospital - family reported patient seeing fireflies at home.) V. SPECIALIZED SERVICES 27A. Physical Therapy  27B. Occupational Therapy  Level: Receives therapy, but does not fulfill the qualifiers stated in the instructions. Level: Does not receive. Actual days/week:1 Yes - equal or greater than 5 times/week Actual days/week:    Actual hours/week:0.5 Yes - equal to  or greater than 2.5 hrs/wk Actual hours/week:    28. Number of Physician Visits - 0 VI. DIAGNOSIS 29. Primary Problem: <principal problem not specified> VII. PLAN OF CARE SUMMARY 30. Primary Diagnosis: Encounter Diagnosis Name Primary?  Altered mental status, unspecified altered mental status type Yes         PMH: See below       PSH:  has a past surgical history that includes Appendectomy; Testicle removal; Tonsillectomy and adenoidectomy; Eye surgery; Prostate surgery; Cochlear implant; and superpubic cath. 31A. Rehabilitation Potential: Appropriate for skilled therapy          Comment:  31B. Current therapy care plan: PT Treatment Interventions: Restorative PTPT Mobility Recommendations: 2A SPTPT Discharge Equipment Recommended: NonePT Assessment/Recommendations Reviewed With:: Care coordinator, PatientNext PT Visit: Progress as ablePT needs to see patient prior to DC : No.  .  .          Comment:  32. Medications: See below 33A. Allergies: Peanut-derived, Bactrim [sulfamethoxazole-trimethoprim], Ciprofloxacin, and No known drug allergy 33B: Treatments:  33C: Abnormal Labs: See below 33D: Precautions:           Comment:  33E: Pacemaker   91F: Diet: Diet minced & moist (Level 5 Orange) Mildly Thick Fluid (Level 2 Magenta)  34. Race/Ethnic Group White or Caucasian [1] 35. Certification  I have personally observed/interviewed this patient and completed this H/C PRI. No - Staff/Chart   I certify that the information contained herein is a true abstract of this patient's condition and medical record. Yes Certified Assessor: [name], RN Identification Number: [id number] Adapted from [phone]  Version: 002FReview Type: Update review12/2/20253 of 3PMH:Past Medical History[1]Medications:Current Facility-Administered Medications Medication Dose Route Frequency  sodium chloride  tablet 1 g  1 g Oral Daily  cefTRIAXone (ROCEPHIN) 100 mg/ml injection 1,000 mg  1,000 mg Intravenous Q24H  atorvastatin (LIPITOR) tablet 10 mg  10 mg Oral Daily  calcium carbonate (TUMS) chewable tablet 250 mg  250 mg Oral 2 times per day  sodium chloride  0.9% irrigation   Intracatheter Daily @ 1800  furosemide (LASIX) tablet 40 mg   40 mg Oral QAM  lactobacillus capsule 1 capsule  1 capsule Oral Daily  magnesium oxide (MAG-OX) tablet 400 mg  400 mg Oral Daily with dinner  melatonin tablet 6 mg  6 mg Oral Daily @ 2100  omeprazole (PriLOSEC) capsule 20 mg  20 mg Oral QAM  sterile water  irrigation   Irrigation Daily  traZODone (DESYREL) tablet 100 mg  100 mg Oral Nightly  normal saline 0.9 % flush 3 mL  3 mL Intravenous Q12H  enoxaparin (LOVENOX) injection 40 mg  40 mg Subcutaneous Daily @ 2100  acetaminophen (TYLENOL) tablet 650 mg  650 mg Oral Q6H PRN  losartan (COZAAR) tablet 25 mg  25 mg Oral Daily  amLODIPine (NORVASC) tablet 2.5 mg  2.5 mg Oral Daily  citalopram (CeleXA) tablet 20 mg  20 mg Oral Nightly Abnormal Labs:Recent Results (from the past 72 hours) EKG 12 lead (initial)  Collection Time: 01/03/24  9:27 AM Result Value Ref Range  Rate 76 bpm  PR 257 ms  P 31 deg  QRSD 129 ms  QT 413 ms  QTc 464 ms  QRS 61 deg  T 80 deg Phosphorus  Collection Time: 01/03/24  9:44 AM Result Value Ref Range  Phosphorus 2.8 2.7 - 4.5 mg/dL Magnesium  Collection Time: 01/03/24  9:44 AM Result Value Ref Range  Magnesium 1.7 1.6 - 2.5 mg/dL Hepatic function panel  Collection Time: 01/03/24  9:44 AM Result Value Ref Range  Total Protein 6.4 6.3 - 7.7 g/dL  Albumin 3.9 3.5 - 5.2 g/dL  Bilirubin,Total 0.3 0.0 - 1.2 mg/dL  Bili,Indirect See below 0.1 - 1.0 mg/dL  Bilirubin,Direct <9.7 0.0 - 0.3 mg/dL  Alk Phos 883 40 - 869 U/L  AST 23 0 - 50 U/L  ALT 26 0 - 50 U/L Lipase  Collection Time: 01/03/24  9:44 AM Result Value Ref Range  Lipase 36 13 - 60 U/L TSH  Collection Time: 01/03/24  9:44 AM Result Value Ref Range  TSH 2.41 0.27 - 4.20 uIU/mL Basic metabolic profile, Plasma  Collection Time: 01/03/24  9:44 AM Result Value Ref Range  Potassium,Plasma 4.3 3.3 - 4.6 mmol/L  Sodium,Plasma 131 (L) 133 - 145 mmol/L  Anion Gap,PL 9 7 - 16   UN,Plasma 26 (H) 6 - 20 mg/dL  Creatinine 8.85 9.32 - 1.17 mg/dL  eGFR BY CREAT 63   Glucose,Plasma 135 (H) 60 - 99 mg/dL  Calcium, PL 8.7 8.6 - 10.2 mg/dL  Chloride,Plasma 96 96 - 108 mmol/L  CO2,Plasma 26 20 - 28 mmol/L CBC and differential  Collection Time: 01/03/24  9:44 AM Result Value Ref Range  WBC 8.1 3.5 - 11.0 THOU/uL  RBC 4.0 4.0 - 6.0 MIL/uL  Hemoglobin 11.9 (L) 12.0 - 17.0 g/dL  Hematocrit 36 (L) 37 - 52 %  MCV 89 75 - 100 fL  RDW 13.2 0.0 - 15.0 %  Platelets 183 150 - 450 THOU/uL  Seg Neut % 62.3 %  Neut # K/uL 5.0 1.5 - 6.5 THOU/uL  Lymph # K/uL 1.7 1.0 - 5.0 THOU/uL  Mono # K/uL 1.3 (H) 0.1 - 1.0 THOU/uL  Eos # K/uL 0.1 0.0 - 0.5 THOU/uL  Baso # K/uL 0.0 0.0 - 0.2 THOU/uL  Nucl RBC % 0.0 0.0 - 0.2 /100 WBC  Nucl RBC # K/uL 0.0 THOU/uL  IMM Granulocytes # 0.1 (H) 0 - 0 THOU/uL  IMM Granulocytes 0.7 % COVID/Influenza A & B/RSV NAAT (PCR)  Collection Time: 01/03/24  9:45 AM Result Value Ref Range  COVID-19 Source  Nasopharyngeal   COVID-19 NAAT (PCR) NEGATIVE Negative  Influenza A NAAT (PCR) NEGATIVE Negative  Influenza B NAAT (PCR) NEGATIVE Negative  RSV NAAT (PCR) NEGATIVE Negative Blood culture  Collection Time: 01/03/24  9:58 AM  Specimen: Blood: aerobic/anaerobic; R ARM Result Value Ref Range  Bacterial Blood Culture .  Lactate, plasma  Collection Time: 01/03/24  9:58 AM Result Value Ref Range  Lactate 1.5 0.5 - 2.2 mmol/L Blood culture  Collection Time: 01/03/24 10:08 AM  Specimen: Blood: aerobic/anaerobic; L ANTECUBITAL Result Value Ref Range  Bacterial Blood Culture .  UA with reflex to Microscopic UA and Reflex to Bacterial Culture  Collection Time: 01/03/24 12:29 PM  Specimen: Urine (Clean catch, voided, midstream) Result Value Ref Range  Color, UA Yellow Yellow - Dk Yellow  Appearance,UR Cloudy (!) Clear  Specific Gravity,UA 1.010 1.002 - 1.030  Leuk Esterase,UA 3+ (!) NEGATIVE   Nitrite,UA POS (!) NEGATIVE  pH,UA 8.5 (H) 5.0 - 8.0  Protein,UA 1+ (!) NEGATIVE  Glucose,UA NEG NEGATIVE  Ketones, UA NEG NEGATIVE  Blood,UA 3+ (!) NEGATIVE Urine microscopic (iq200)  Collection Time: 01/03/24 12:29 PM Result Value Ref Range  RBC,UA 3-10 (!) 0 - 2 /hpf  WBC,UA 11-20 (!) 0 - 5 /hpf  Bacteria,UA 4+ (!) None Seen - 1+  Squam Epithel,UA 1+ None Seen - 1+ Aerobic bacterial urine culture  Collection Time: 01/03/24 12:29 PM  Specimen: Urine (Clean catch, voided, midstream); NO COLLECTION METHOD Result Value Ref Range  Aerobic bacterial urine culture .  Comprehensive metabolic panel  Collection Time: 01/04/24  6:20 AM Result Value Ref Range  Sodium 130 (L) 133 - 145 mmol/L  Potassium 4.2 3.3 - 5.1 mmol/L  Chloride 95 (L) 96 - 108 mmol/L  CO2 23 20 - 28 mmol/L  Anion Gap 12 7 - 16  UN 19 6 - 20 mg/dL  Creatinine 8.89 9.32 - 1.17 mg/dL  eGFR BY CREAT 65   Glucose 73 60 - 99 mg/dL  Calcium 8.4 (L) 8.6 - 10.2 mg/dL  Total Protein 5.9 (L) 6.3 - 7.7 g/dL  Albumin 3.7 3.5 - 5.2 g/dL  Bilirubin,Total 0.4 0.0 - 1.2 mg/dL  AST 23 0 - 50 U/L  ALT 26 0 - 50 U/L  Alk Phos 114 40 - 130 U/L CBC and differential  Collection Time: 01/04/24  6:20 AM Result Value Ref Range  WBC 8.6 3.5 - 11.0 THOU/uL  RBC 3.9 (L) 4.0 - 6.0 MIL/uL  Hemoglobin 11.4 (L) 12.0 - 17.0 g/dL  Hematocrit 34 (L) 37 - 52 %  MCV 89 75 - 100 fL  RDW 13.1 0.0 - 15.0 %  Platelets 189 150 - 450 THOU/uL  Seg Neut % 60.0 %  Neut # K/uL 5.1 1.5 - 6.5 THOU/uL  Lymph # K/uL 2.1 1.0 - 5.0 THOU/uL  Mono # K/uL 1.2 (H) 0.1 - 1.0 THOU/uL  Eos # K/uL 0.1 0.0 - 0.5 THOU/uL  Baso # K/uL 0.0 0.0 - 0.2 THOU/uL  Nucl RBC % 0.0 0.0 - 0.2 /100 WBC  Nucl RBC # K/uL 0.0 THOU/uL  IMM Granulocytes # 0.1 (H) 0 - 0 THOU/uL  IMM Granulocytes 0.6 % *Note: Due to a large number of results and/or encounters for the requested time period, some results have not been  displayed. A complete set of results can be found in Results Review. LDAs:Lines/Drains/Airways/Wounds   Drain  Duration       Suprapubic Catheter 1 day    Line  Duration       Peripheral IV 01/03/24 0944 18 G Anterior;Right Forearm (middle third) 1 day     [1] Past Medical History:Diagnosis Date  Asthma   Cerebral artery occlusion with cerebral infarction   COPD (chronic obstructive pulmonary disease)   COPD (chronic obstructive pulmonary disease)   Hyperlipidemia   Hypertension   Meningitis   Testicle cancer

## 2024-01-05 DIAGNOSIS — R627 Adult failure to thrive: Secondary | ICD-10-CM

## 2024-01-05 DIAGNOSIS — R1310 Dysphagia, unspecified: Secondary | ICD-10-CM

## 2024-01-05 DIAGNOSIS — K56609 Unspecified intestinal obstruction, unspecified as to partial versus complete obstruction: Secondary | ICD-10-CM

## 2024-01-05 NOTE — Progress Notes (Signed)
 SW spoke with daughter Alfonso with bed offer at [facility], bed accepted when medically stable.

## 2024-01-05 NOTE — Progress Notes (Signed)
 APP Progress NoteInterval HistoryPatient alert at time of assessment; mildly agitated about noise in the hallway.  Patient oriented to situation.  Offers no complaints.  All vital signs stable.  Discussed plan to continue antibiotics for urinary tract infection; discussed plan to discharge to SNF for short-term rehab, currently awaiting bed.  Patient agreeable with current plan.Current Facility-Administered Medications Medication Dose Route Frequency  sodium chloride  tablet 1 g  1 g Oral Daily  cefTRIAXone  (ROCEPHIN ) 100 mg/ml injection 1,000 mg  1,000 mg Intravenous Q24H  atorvastatin  (LIPITOR) tablet 10 mg  10 mg Oral Daily  calcium  carbonate (TUMS) chewable tablet 250 mg  250 mg Oral 2 times per day  sodium chloride  0.9% irrigation   Intracatheter Daily @ 1800  furosemide  (LASIX ) tablet 40 mg  40 mg Oral QAM  lactobacillus capsule 1 capsule  1 capsule Oral Daily  magnesium  oxide (MAG-OX) tablet 400 mg  400 mg Oral Daily with dinner  melatonin tablet 6 mg  6 mg Oral Daily @ 2100  omeprazole  (PriLOSEC) capsule 20 mg  20 mg Oral QAM  sterile water  irrigation   Irrigation Daily  traZODone  (DESYREL ) tablet 100 mg  100 mg Oral Nightly  normal saline 0.9 % flush 3 mL  3 mL Intravenous Q12H  enoxaparin  (LOVENOX ) injection 40 mg  40 mg Subcutaneous Daily @ 2100  acetaminophen  (TYLENOL ) tablet 650 mg  650 mg Oral Q6H PRN  losartan  (COZAAR ) tablet 25 mg  25 mg Oral Daily  amLODIPine  (NORVASC ) tablet 2.5 mg  2.5 mg Oral Daily  citalopram  (CeleXA ) tablet 20 mg  20 mg Oral Nightly Intake/Output last 3 shifts:I/O last 3 completed shifts:12/02 0700 - 12/03 0659In: 30 (0.3 mL/kg) [IV Piggyback:30]Out: 1675 (17.6 mL/kg) [Urine:1675 (0.7 mL/kg/hr)]Net: -1645Weight: 95.3 kg Intake/Output this shift:No intake/output data recorded.Physical Exam:Gen: Patient is in alert, conversational HENT: oral pharynx without lesions, Moist mucous membranesNeck:  supple, no lymphadenopathy Lungs: Clear to auscultation bilaterally, no wheezes or rhonchi.Heart: RRR no murmurs, rubs, or gallops, pulses +2Abdomen: Soft, non distended, BS present, non tenderNeurological Examination: Awake and alert. Oriented to person, place, and time.  Fluent.  Comprehension intact.  Affect appropriate.Past medical history:I have reviewed and confirmed the past medical history in the chart.Medications: reviewed medication list in the chartAllergies: reviewed allergy section in the chartLab Results: Recent Results (from the past 48 hours) EKG 12 lead (initial)  Collection Time: 01/03/24  9:27 AM Result Value  Rate 76  PR 257  P 31  QRSD 129  QT 413  QTc 464  QRS 61  T 80 Phosphorus  Collection Time: 01/03/24  9:44 AM Result Value  Phosphorus 2.8 Magnesium   Collection Time: 01/03/24  9:44 AM Result Value  Magnesium  1.7 Hepatic function panel  Collection Time: 01/03/24  9:44 AM Result Value  Total Protein 6.4  Albumin 3.9  Bilirubin,Total 0.3  Bili,Indirect See below  Bilirubin,Direct <0.2  Alk Phos 116  AST 23  ALT 26 Lipase  Collection Time: 01/03/24  9:44 AM Result Value  Lipase 36 TSH  Collection Time: 01/03/24  9:44 AM Result Value  TSH 2.41 Basic metabolic profile, Plasma  Collection Time: 01/03/24  9:44 AM Result Value  Potassium,Plasma 4.3  Sodium,Plasma 131 (L)  Anion Gap,PL 9  UN,Plasma 26 (H)  Creatinine 1.14  eGFR BY CREAT 63  Glucose,Plasma 135 (H)  Calcium , PL 8.7  Chloride,Plasma 96  CO2,Plasma 26 CBC and differential  Collection Time: 01/03/24  9:44 AM Result Value  WBC 8.1  RBC 4.0  Hemoglobin 11.9 (L)  Hematocrit 36 (L)  MCV 89  RDW 13.2  Platelets 183  Seg Neut % 62.3  Neut # K/uL 5.0  Lymph # K/uL 1.7  Mono # K/uL 1.3 (H)  Eos # K/uL 0.1  Baso # K/uL 0.0  Nucl RBC % 0.0  Nucl RBC # K/uL 0.0  IMM Granulocytes # 0.1 (H)   IMM Granulocytes 0.7 COVID/Influenza A & B/RSV NAAT (PCR)  Collection Time: 01/03/24  9:45 AM Result Value  COVID-19 Source  Nasopharyngeal  COVID-19 NAAT (PCR) NEGATIVE  Influenza A NAAT (PCR) NEGATIVE  Influenza B NAAT (PCR) NEGATIVE  RSV NAAT (PCR) NEGATIVE Blood culture  Collection Time: 01/03/24  9:58 AM  Specimen: Blood: aerobic/anaerobic; R ARM Result Value  Bacterial Blood Culture . Lactate, plasma  Collection Time: 01/03/24  9:58 AM Result Value  Lactate 1.5 Blood culture  Collection Time: 01/03/24 10:08 AM  Specimen: Blood: aerobic/anaerobic; L ANTECUBITAL Result Value  Bacterial Blood Culture . UA with reflex to Microscopic UA and Reflex to Bacterial Culture  Collection Time: 01/03/24 12:29 PM  Specimen: Urine (Clean catch, voided, midstream) Result Value  Color, UA Yellow  Appearance,UR Cloudy (!)  Specific Gravity,UA 1.010  Leuk Esterase,UA 3+ (!)  Nitrite,UA POS (!)  pH,UA 8.5 (H)  Protein,UA 1+ (!)  Glucose,UA NEG  Ketones, UA NEG  Blood,UA 3+ (!) Urine microscopic (iq200)  Collection Time: 01/03/24 12:29 PM Result Value  RBC,UA 3-10 (!)  WBC,UA 11-20 (!)  Bacteria,UA 4+ (!)  Squam Epithel,UA 1+ Aerobic bacterial urine culture  Collection Time: 01/03/24 12:29 PM  Specimen: Urine (Clean catch, voided, midstream); NO COLLECTION METHOD Result Value  Aerobic bacterial urine culture . Comprehensive metabolic panel  Collection Time: 01/04/24  6:20 AM Result Value  Sodium 130 (L)  Potassium 4.2  Chloride 95 (L)  CO2 23  Anion Gap 12  UN 19  Creatinine 1.10  eGFR BY CREAT 65  Glucose 73  Calcium  8.4 (L)  Total Protein 5.9 (L)  Albumin 3.7  Bilirubin,Total 0.4  AST 23  ALT 26  Alk Phos 114 CBC and differential  Collection Time: 01/04/24  6:20 AM Result Value  WBC 8.6  RBC 3.9 (L)  Hemoglobin 11.4 (L)  Hematocrit 34 (L)  MCV 89  RDW 13.1  Platelets 189   Seg Neut % 60.0  Neut # K/uL 5.1  Lymph # K/uL 2.1  Mono # K/uL 1.2 (H)  Eos # K/uL 0.1  Baso # K/uL 0.0  Nucl RBC % 0.0  Nucl RBC # K/uL 0.0  IMM Granulocytes # 0.1 (H)  IMM Granulocytes 0.6 *Note: Due to a large number of results and/or encounters for the requested time period, some results have not been displayed. A complete set of results can be found in Results Review. Radiology: EKG 12 lead (initial)Result Date: 12/2/2025Sinus rhythm Prolonged PR interval Nonspecific  intraventricular conduction delay Probable  anterolateral infarct, old*Chest STANDARD single viewResult Date: 12/1/2025No definite consolidation. END OF IMPRESSION CT head without contrastResult Date: 12/1/2025Sequelae of left-sided cochlear implant placement with associated streak artifact which limits visibility of adjacent structures. No acute intracranial abnormality. Sequelae of mild chronic microvascular ischemic disease and moderate generalized parenchymal volume loss. END OF IMPRESSION Active Hospital Problems  Diagnosis  FTT (failure to thrive) in adult  Resolved Hospital Problems No resolved problems to display. Assessment and Plan:Riley [name] is a 88 y.o. male with PMH of this is an 88 year old gentleman with PMH notable for BPH, dysphagia, elevated PSA, melanoma, neurogenic bladder s/p  SPC, meningitis in 2015, presenting with anxiety, hallucinations, and delusions. Plan: #Adult FTT#Weakness#Urinary tract infection; chronic suprapubic catheter#Concerns for aspiration in the setting of dysphagia- Refer to imaging results.- White blood cell count 8.6, afebrile.- Urinalysis positive for blood, nitrite, leuk esterase, white blood cell, bacteria on admission; urine culture remains pending-does have a history of Proteus UTI.- Blood cultures obtained x 2 on admission; remain without growth to date.- Viral respiratory swab negative.-  Continue on Ceftriaxone  1 g IV daily x 7 days; initiated on 12/1.- Suprapubic catheter exchanged in the ED on 12/1.- Suprapubic catheter irrigation daily.- SLP swallow evaluation - PT evaluation completed; recommendations received for SNF for STR.#Longstanding chronic hyponatremia--Patient noted to have longstanding chronic hyponatremia dating back to 2023.--Sodium 130 with a.m. labs; appears baseline.--Home sodium tablet 1 g daily restarted on 12/2.--Continue to monitor with daily labs.#Depression#Insomnia#Hallucinations- Continue Home Celexa  at HS.- Continue home melatonin 6 mg at HS.- Continue decreased dosing of trazodone  100 mg at HS - per family report recent increase to 150 mg did not help with hallucinations. #HTN#CHF- BP stable while hospitalized.- Continue home amlodipine .- Continue home Lasix .#HLD- Home atorvastatin  #GERD- Continue home omeprazole .- Continue home lactobacillus.  CODE: DNR/DNI, MOLST form completed with daughter/HCP Alfonso Signed By: Penne Gal, [facility]12/04/2023, 9:13 AM

## 2024-01-05 NOTE — Discharge Summary (Signed)
 Patient Name: Riley [name] Date of Admission: 01/03/2024 DOB: [birth year] Date of Discharge: 01/05/2024 Patient MR#: [id number] Admitting Physician: Larwance Marker, MD Primary Care Physician: Con Flavia Fuller, [place]  Discharge Physician: Marker Larwance, MD [facility] Internal Medicine Discharge Summary - APP Discharge Diagnoses Active Hospital Problems  Diagnosis   FTT (failure to thrive) in adult  Hospital Course Patient is a 88 year old male with a past medical history BPH, neurogenic bladder status post suprapubic catheter placement, with chronic suprapubic catheter, dysphagia, who presented to the ED with weakness and failure to thrive.  Urinalysis was obtained on admission, indicates infection-urine culture remains pending.  Patient noted to have a history of Proteus urinary tract infection; was treated off of prior sensitivities and received IV Rocephin  while hospitalized.  Patient had ongoing issues with dysphagia prior to admission; SLP evaluation was obtained and recommendations were received for small and bite-size diet with thin liquids.  Patient was maintained on aspiration precautions; chest x-ray did not reveal any aspiration pneumonia.  Patient's suprapubic catheter was exchanged while hospitalized on 12/1.  Patient noted to be hyponatremic on admission; appears to be a longstanding chronic issue, patient was continued on his home sodium tablets and sodium level remained stable.  Patient noted to have delusions/hallucinations prior to admission; was initiated on trazodone  in the outpatient setting-dose was adjusted and delusions/hallucinations appear to be improving per patient's family.  Patient now medically stable and ready for discharge to SNF.  Patient will require 4 more days of Augmentin for continued treatment of his urinary tract infection; urine culture remains pending on discharge and will need follow-up by SNF provider.  It is also recommended that  patient have repeat labs on Saturday 12/6.Procedures NoneConsults NoneDischarge Physical Exam Vitals:BP (!) 123/49   Pulse 70   Temp 36.5 C (97.7 F)   Resp 18   Ht 1.803 m (5' 11)   Wt 95.3 kg (210 lb)   SpO2 95%   BMI 29.29 kg/m Oxygen Requirement:  Weight:Wt Readings from Last 3 Encounters: 01/03/24 95.3 kg (210 lb) 03/16/23 90.7 kg (200 lb) 08/24/22 90.7 kg (200 lb) Physical Exam by Systems:General: Not in acute distress. Conversant and able to make needs known.HEENT: Normocephalic, atraumatic. PERRLA. EOMI. No lesions on nose or ears. MMMNECK: Supple, trachea is in midline, no palpable lymphadenopathy.CVS: S1 S2 heard, RRR, no appreciable murmurs/rubs/gallopsPULM: normal effort, CTAB, with no appreciable wheezes, rales, or rhonchiABD: Bowel sounds active, soft, non tender, not distended. No guarding rigidity or rebound tenderness. EXT: No clubbing, cyanosis, or edema. Distal pulses 2+NEURO: A and O to person, place, time and current events.Strength 5/5 in bilateral upper and lower extremities.Foley: Yes, chronic suprapubic.  Labs CBC BMP Recent Labs Lab 12/02/250620 12/01/250944 WBC 8.6 8.1 Hemoglobin 11.4* 11.9* Hematocrit 34* 36* Platelets 189 183  Recent Labs Lab 12/02/250620 12/01/250944 Sodium 130*  --  Potassium 4.2  --  Chloride 95*  --  CO2 23  --  UN 19  --  Creatinine 1.10 1.14 Glucose 73  --  Calcium  8.4*  --  Albumin 3.7 3.9 Phosphorus  --  2.8  Coags LFTs No results for input(s): APTT, INR, PTT, PTI in the last 168 hours. Recent Labs Lab 12/02/250620 12/01/250944 Alk Phos 114 116 Bilirubin,Total 0.4 0.3 Bilirubin,Direct  --  <0.2 Albumin 3.7 3.9 ALT 26 26 AST 23 23 Total Protein 5.9* 6.4  Cardiac Enzymes Other No results for input(s): CKTS, TROPU, TROP, MCKMB, CKMB in the last 168 hours.No components found  with this basename: RICKMBS  ABG: Recent Labs Lab 12/01/250958 Lactate 1.5  Microbiology results Aerobic bacterial urine culture Date Value Ref Range Status 01/03/2024 .  Final 12/20/2023 .  Final Aerobic Culture Date Value Ref Range Status 02/17/2022 Lab Cancel  Final 02/17/2022 Proteus mirabilis (!)  Final   Comment:    >100,000/mlIsolate identified by MALDI-TOF. Bacterial Blood Culture Date Value Ref Range Status 01/03/2024 .  Preliminary 01/03/2024 .  Preliminary  Radiology Reports EKG 12 lead (initial)Result Date: 12/2/2025Sinus rhythm Prolonged PR interval Nonspecific  intraventricular conduction delay Probable  anterolateral infarct, old*Chest STANDARD single viewResult Date: 12/1/2025No definite consolidation. END OF IMPRESSION CT head without contrastResult Date: 12/1/2025Sequelae of left-sided cochlear implant placement with associated streak artifact which limits visibility of adjacent structures. No acute intracranial abnormality. Sequelae of mild chronic microvascular ischemic disease and moderate generalized parenchymal volume loss. END OF IMPRESSION Discharge Medications Current Discharge Medication List  CONTINUE these medications which have NOT CHANGED  Details AM Noon PM Bedtime sodium chloride  1 GM tablet Take 1 tablet (1 g total) by mouth daily.          losartan  25 mg tablet Take 1 tablet (25 mg total) by mouth daily.          Cranberry-Vitamin C-D Mannose 250-30-50 MG CHEW Take 2 capsules by mouth daily (with dinner). PLAIN D MANNOSE          Citric Ac-Gluconolact-Mg Carb (RENACIDIN ) 30 mLs by Intracatheter route daily for Urinary Catheter Irrigation.Qty: 900 mL, Refills: 6          sterile water  irrigation Irrigate/flush  suprapubic catheter daily or as needed to prevent cloggingQty: 1000 mL, Refills: 5          omeprazole  (PRILOSEC) 20 mg capsule Take 1 capsule (20 mg  total) by mouth daily (before breakfast).          furosemide  (LASIX ) 40 mg tablet Take 1 tablet (40 mg total) by mouth every morning.          calcium  carbonate 250 mg/mL suspension Take 5 mLs (1,250 mg total) by mouth daily.          magnesium  gluconate (MAGONATE) 500 MG tablet Take 1 tablet (500 mg total) by mouth daily (with dinner).          Lactobacillus (ACIDOPHILUS) 100 MG CAPS Take 1 capsule by mouth daily.          amLODIPine  (NORVASC ) 5 MG tablet Take 0.5 tablets (2.5 mg total) by mouth daily.          citalopram  (CELEXA ) 20 MG tablet Take 1 tablet (20 mg total) by mouth nightly.          Melatonin 3 MG CAPS Take 6 mg by mouth nightly.          traZODone  (DESYREL ) 50 MG tablet Take 2 tablets (100 mg total) by mouth nightly.          atorvastatin  (LIPITOR) 10 MG tablet Take 1 tablet (10 mg total) by mouth nightly.          Catheters (DOVER SILICONE FOLEY CATH 20FR) MISC By 1 Product no specified route Q 3 weeks for 20 French catheter foley kit with bedside bags for SPT exchanges q3weeks indefinitely for chronic urinary retention.Qty: 2 each, Refills: 11      Associated Diagnoses: Neurogenic bladder          Incontinence Supplies (BARD IRRIGATION TRAY) KIT By 1,000 mLs no specified route 2 times daily as needed (for bladder irrigation) for Bladder irrigation tray  kit for sediment due to chronic SPT, irrigate 30 mL water , twice per day, dispense 2L per month.Qty: 2 each, Refills: 11      Associated Diagnoses: Neurogenic bladder           Discharge Instructions #Adult FTT#Weakness#Urinary tract infection; chronic suprapubic catheter#Concerns for aspiration in the setting of dysphagia- White blood cell count 8.6, afebrile.- Urinalysis positive for blood, nitrite, leuk esterase, white blood cell, bacteria on admission; urine culture remains pending-does have a history of Proteus UTI.- Blood cultures obtained x 2 on admission; remain  without growth to date.- Viral respiratory swab negative.- Received IV Rocephin  for treatment of UTI while hospitalized based off of prior urine culture and susceptibility; will require for days of Augmentin for continued treatment of UTI after discharge to [facility].- Suprapubic catheter exchanged in the ED on 12/1.- Suprapubic catheter irrigation daily.- SLP swallow evaluation completed; recommendations for small bite-size diet with thin liquids #Longstanding chronic hyponatremia--Patient noted to have longstanding chronic hyponatremia dating back to 2023.--Sodium 130 with a.m. labs; appears baseline.--Home sodium tablet 1 g daily restarted on 12/2.--Continue to monitor with labs. #Depression#Insomnia#Hallucinations- Continue Home Celexa  at HS.- Continue home melatonin 6 mg at HS.- Continue decreased dosing of trazodone  100 mg at HS - per family report recent increase to 150 mg did not help with hallucinations. Follow up labs: CBC, CMP on 12/6.Discharge Activity:Subacute SNF therapiesDischarge Diet:Heart Healthy DietComplications:Call your PCP's office if you are experiencing any of the following: chest pain, shortness of breath, acute abdominal pain, no BM>4 days, unable to void, fever/chills, any prolonged bleeding if on blood thinner medication.Signed By: Penne Gal, ACNPC-AG on 01/05/2024 at 1:31 PM Records dictated utilizing Dragon Naturally Speaking software. Some inadvertent typographical and transcription errors generated by the transcription software may not have been omitted despite a reasonable effort to identify and correct them. Name: Riley [name] MRN: [id number] DOB: 04/16/1935   Admit Date: 01/03/2024 Date of Discharge: 01/05/2024 Patient was accepted for discharge to To SNF (Skilled Nursing) [3] Discharge Attending Physician: Sherrill Parkin, MDHospitalization SummaryConcise Narrative:  Patient is a 88 year old male with a past medical history BPH, neurogenic bladder status post suprapubic catheter placement, with chronic suprapubic catheter, dysphagia, who presented to the ED with weakness and failure to thrive.  Urinalysis was obtained on admission, indicates infection-urine culture remains pending.  Patient noted to have a history of Proteus urinary tract infection; was treated off of prior sensitivities and received IV Rocephin  while hospitalized.  Patient had ongoing issues with dysphagia prior to admission; SLP evaluation was obtained and recommendations were received for small and bite-size diet with thin liquids.  Patient was maintained on aspiration precautions; chest x-ray did not reveal any aspiration pneumonia.  Patient's suprapubic catheter was exchanged while hospitalized on 12/1.  Patient noted to be hyponatremic on admission; appears to be a longstanding chronic issue, patient was continued on his home sodium tablets and sodium level remained stable.  Patient noted to have delusions/hallucinations prior to admission; was initiated on trazodone  in the outpatient setting-dose was adjusted and delusions/hallucinations appear to be improving per patient's family.  Patient now medically stable and ready for discharge to SNF.  Patient will require 4 more days of Augmentin for continued treatment of his urinary tract infection; urine culture remains pending on discharge and will need follow-up by SNF provider.  It is also recommended that patient have repeat labs on Saturday 12/6.                  Signed:  Penne Gal, NP  On: 01/05/2024  at: 1:31 PM

## 2024-01-05 NOTE — Plan of Care (Signed)
 Problem: SwallowingGoal: LTG - patient will tolerate the least restrictive diet consistency to allow for safe consumption of daily mealsOutcome: Progressing towards goal Problem: SwallowingGoal: LTG - Patient will demonstrate safe swallowing Intervention/techniquesOutcome: Progressing towards goal

## 2024-01-05 NOTE — Progress Notes (Addendum)
 Mode: Engineer, Production Name: DavidVolunteer Phone: [phone]    Comments: Patient's transport to [facility] set up for around 2:30. Attempted to call patient's daughter Alfonso. No answer message left for return call. Nursing staff and facility aware.Lesley Vaughn CHARY PM Daughter Alfonso stopped in Case management and was updated. Alfonso had some questions. Asked NP to meet her in patient's room for medical update.Lesley Vaughn LATHER

## 2024-01-05 NOTE — Plan of Care (Signed)
 Problem: Risk for FallsGoal: No falls during hospitalizationDescription: Patient will not fall during hospitalization.Outcome: Maintaining Problem: Knowledge DeficitGoal: Knowledge - personal safetyDescription: Patient will verbalize understanding of fall prevention.Outcome: Maintaining Problem: Potential Alteration in Skin Integrity - Pressure UlcerGoal: The patient will maintain intact skin free of pressure ulcerOutcome: Maintaining Problem: Potential Alteration in Skin Integrity - Skin TearGoal: The patient's risk for skin tears is minimizedOutcome: Maintaining Problem: NeurologicalGoal: Neurological status is stable or improvingOutcome: Maintaining Problem: SafetyGoal: Patient will remain free of fallsOutcome: Maintaining Problem: Pain/ComfortGoal: Patient's pain or discomfort is manageableOutcome: Maintaining Problem: NutritionGoal: Nutritional status is maintained or improved - GeriatricOutcome: Maintaining Problem: MobilityGoal: Functional status is maintained or improved - GeriatricOutcome: Maintaining Problem: PsychosocialGoal: Demonstrates ability to cope with illnessOutcome: Maintaining Problem: Cognitive functionGoal: Cognitive function will be maintained or return to baselineDescription: Interventions:Delirium AssessmentLIVEBAR AssessmentOutcome: MaintainingGoal: Lines and tethers will be removed as appropriateOutcome: MaintainingGoal: Patient maintains appropriate nutritional intakeOutcome: MaintainingGoal: Vital signs will be within normal limitsOutcome: MaintainingGoal: Evidence for potential causes of delirium will be managedOutcome: MaintainingGoal: Behaviors will return to baselineOutcome: MaintainingGoal: Ambulation and mobility will be maintainedOutcome: MaintainingGoal: Retention of urine and constipation will be managedOutcome: Maintaining Problem: Bowel  EliminationGoal: Elimination pattern is normal or improvingOutcome: Maintaining Problem: Bladder EliminationGoal: Patient is able to empty bladder or return to baselineOutcome: Maintaining

## 2024-01-05 NOTE — Progress Notes (Signed)
 Wound Wednesday/Admission Skin NoteAdmission Braden:15 Current Braden: 14Q2 hour turns: Per policyFoley catheter: Supra pubicStool management device: NoMattress: Hospital bed mattressPressure Injury (PI) Present: No                         Pressure Injury (PI) LDA present in assessment flowsheet?: NoWound dimension documented in assessment flowsheet?: N/AWound care documented in assessment flowsheet?: N/A2 RN skin check performed with Reena Chang RNRedness, strong yeast odor noted in groin area ( Daughter reports using antifungal powder at home)Heels reddened blanchable- Heel protectors applied Patient turned and repositioned pericare provided had a bowel movement

## 2024-01-05 NOTE — Progress Notes (Signed)
 Pharmacy Discharge Education Medication Review I reviewed Riley [name]'s medications while they were admitted to [facility] for weakness. The patient was discharged on 01/05/2024 to home.Relevant Medication/Adherence Information:No educational needs identified for discharge to skilled nursing facility.No educational needs identified for discharge; no changes made to patient's prior-to-admission medication regimen.Current Outpatient Medications Medication Instructions  amLODIPine  (NORVASC ) 2.5 mg, Oral, DAILY  atorvastatin  (LIPITOR) 10 mg, Oral, NIGHTLY  calcium  carbonate 1,250 mg, Oral, DAILY  Catheters (DOVER SILICONE FOLEY CATH 20FR) MISC 1 Product, Does not apply, EVERY 3 WEEKS  citalopram  (CELEXA ) 20 mg, Oral, NIGHTLY  Citric Ac-Gluconolact-Mg Carb (RENACIDIN ) 30 mLs, Intracatheter, DAILY  Cranberry-Vitamin C-D Mannose 250-30-50 MG CHEW 2 capsules, Oral, DAILY WITH DINNER, PLAIN D MANNOSE  furosemide  (LASIX ) 40 mg, EVERY MORNING  Incontinence Supplies (BARD IRRIGATION TRAY) KIT 1,000 mLs, Does not apply, 2 TIMES DAILY PRN  Lactobacillus (ACIDOPHILUS) 100 MG CAPS 1 capsule, Oral, DAILY  losartan  (COZAAR ) 25 mg, DAILY  magnesium  gluconate (MAGONATE) 500 mg, DAILY WITH DINNER  Melatonin 6 mg, Oral, NIGHTLY  omeprazole  (PRILOSEC) 20 mg, DAILY BEFORE BREAKFAST  sodium chloride  1 g, DAILY  sterile water  irrigation Irrigate/flush  suprapubic catheter daily or as needed to prevent clogging  traZODone  (DESYREL ) 100 mg, NIGHTLY  Education:No pharmacist education provided at this time. Please contact me about medication issues related to this inpatient admission via inbasket in eRecord or by phone at ext. [phone].Vernell Earnie Plume, PharmD

## 2024-01-05 NOTE — Progress Notes (Signed)
 Clinical Swallow Evaluation  Name: Riley PARAS DagonLocation:  Bed: NMS-306-01 History     Riley [name] is a 88 y.o. male with PMH of this is an 88 year old gentleman with PMH notable for BPH, dysphagia, elevated PSA, melanoma, neurogenic bladder s/p SPC, meningitis in 2015, presenting with anxiety, hallucinations, and delusions. Subjective  Family reports a long history of dysphagia and choking events. No objective studies have been completed.   Objective   Oral Mechanism - 01/05/24 1129    Oral Cavity Inspection  Dentition Good condition;Natural dentition   Mandible Within normal limits   Oral Hygiene & Saliva Management Well managed thin, clear saliva    Oral Mechanism Assessment  Oral Mechanism Exam Impaired - Oral Motor Exam completed    Oral Motor Exam  Reflexive Cough Adequate   Voluntary Cough Adequate   Reflexive Swallow Present   Voluntary Swallow Reduced   Facial Symmetry Normal   Gag Reflex Did not assess   Palate Unable to view   Lips adequate   Tongue Protrusion Adequate   Tongue Retraction Adequate   Tongue Elevation Adequate   Tongue Lateralization Adequate   Tongue Strength Adequate   Lingual Coordination Adequate   Facial Sensation Intact     Dysphagia - 01/05/24 1129    Nutritional Status  Nutritional Status Oral diet   Current Solid Diet Minced/Moist   Current Liquid Diet Mildly thick    Oral Trial Consistencies  Oral Trials Tested Thin Liquid;Soft Solid;Hard Solid   Thin Liquid Presentation --   water  bottle from home  Oral Thin Liquid Piecemeal swallow   Pharyngeal Thin Liquid Multiple swallows of bolus;Audible swallow   noted increased burping with trials  Oral Soft Solid Reduced bolus cohesion;Piecemeal swallow;Liquid assist needed   Pharyngeal Soft Solid Multiple swallows of bolus;Audible swallow   burping  Oral Hard Solid Increased oral transit time;Reduced bolus  cohesion;Liquid assist needed;Piecemeal swallow   Pharyngeal Hard Solid Multiple swallows of bolus;Audible swallow   burping   Esophageal Phase  Esophageal Symptoms Belching;Regurgitation    Compensatory Strategies  Compensatory Strategies Alternate solids and liquids   Alternate Solids and Liquids Minimal cues      Assessment  Assessment and Plan - 01/05/24 1129    Assessment   Diagnosis Oropharyngeal dysphagia;Pharyngoesophageal dysphagia   Severity Moderate   Risk for Aspiration Moderate   Prognosis Fair   Negative Prognostic Indicators Age;Severity of deficits   Impressions Patient presents with a history of choking and dypshagia. He has required 2, 911 calls for choking. He has experienced 5 choking episodes in the last year. Per family, this is worsening. 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 He has demonstrated difficulty with breathing when he is choking. Per the family, chicken and meat have been difficult for him to swallow. He has to swallow medications one at a time with liquid. He was supposed to see a GI MD for this issue and the appointment was canceled as the MD left the office. The appointment was not re-scheduled. He has never had any imaging of his swallow function or his esophagus and he has not had any dilatations of his esophagus. He completed bites of soft and harder solids on this date and sips of water . Noted slowed mastication, piecemeal oral transfer and swallow. Use of liquid required to clear lingual stasis. Noted audible swallows with all consistencies and increased burping as trials increased. NO overt coughing, throat clearing, eye tearing, wet  breathing or increased respiratory rate post swallow with thin liquids. Suspect a multi-stage dysphagia. Per family reports, there have been episodes where he has regurgitated and brought foods and liquids back up and out.     Recommendations  Diet: Soft and bite sized Liquids: Thin liquids   Assessment and Plan - 01/05/24 1129    Recommendations  Medication Recommendations Whole/One at a time   Aspiration Precautions Upright 90 degrees for all PO;Remain upright at least 30 minutes after meals;Small bites and sips;Slow rate;Single sips;Alternate solids and liquids   Reflux Precautions Remain upright at least 30 minutes after meals   Discharge Supervision Recommendations 24-hour supervision   Instrumental Studies Recommendations Modified Barium Swallow Study/Pharyngogram;Barium Esophagram   Communication Recommendations Allow for increased processing time;Reduce distractions   Cognition Recommendations Use simple 1-step directions;Reduce distractions;Allow for increased processing time   Referral Recommendations GI Consult   Discharge Recommendations Outpatient instrumental swallow study     Jesscia Imm J Dametrius Sanjuan, CCC-SLP

## 2024-01-06 NOTE — CDI Query (Signed)
 Request for Documentation ClarificationNoyes Jacquis, [facility] [id number]; VISIT [phone]; DELAYNE [phone] Query Response==============Sent: 01/06/24 11:05 ESTFrom: VALERE PENNE AGENT, NPQUERY QUESTION: BASED ON YOUR MEDICAL JUDGMENT, CAN YOU PLEASE CLARIFY THERELATIONSHIP BETWEEN THE DEVICE AND THE UTI?Provider response: Other: UTI due to chronic suprapubic catheterQUERY QUESTION: PRESENT ON ADMISSION?Provider response: Diagnosis was present at time of inpatient admissionElectronically signed by VALERE PENNE AGENT, NP on 01/06/24 at 11:05 ESTOriginal Query==============Sent: 01/06/24 10:02 ESTFrom: Arvell Marks: Kanan Sobek, PENNE AGENT, NPBASED ON YOUR MEDICAL JUDGMENT, CAN YOU PLEASE CLARIFY THE RELATIONSHIP BETWEENTHE DEVICE AND THE UTI?* UTI due to indwelling urethral catheter* UTI due to cystostomy catheter* UTI due to other urinary catheter* There is no evidence of a relationship between the device and the UTI* Other:* Unable to determinePRESENT ON ADMISSION?* Diagnosis was present at time of inpatient admission* Diagnosis was not present at time of inpatient admission* Clinically unable to determine whether the condition was present at the [facility] inpatient admission* Other:CLINICAL INFORMATION* -Documentation in the medical record indicates that this patient has beenadmitted with or diagnosed as having a Urinary Tract Infection: DischargesummaryPatient is a 88 year old male with a past medical history BPH, neurogenicbladder status post suprapubic catheter placement, with chronic suprapubiccatheter* The following is also documented in the MEDICAL RECORD NUMBER* Cystostomy: Present* Other: Discharge summaryPatient will require 4 more days of Augmentin for continued treatment of hisurinary tract infection; urine culture remains pending on discharge and willneed follow-up by SNF  provider.  It is also recommended that patient haverepeat labs on Saturday 12/6.TreatmentIV Rocephin , and SPC irrigation.Excerpt from Supporting [email or web address]'s look at an example. If the physician documents that the patient has a UTIwith a Foley catheter, what information can be coded from that statement? Theanswer would be that the patient has both a UTI and a Foley. What if I said toyou the patient has a UTI due to a Foley catheter? In that instance, I havejust stated that the UTI was caused by the Foley catheter, and no query would benecessary.

## 2024-01-06 NOTE — Progress Notes (Signed)
 01/06/24 0823 Post-Discharge Phone Call Was 24 hour post-discharge phone call completed? No Follow up call not made Patient d/c to SNF

## 2024-01-07 ENCOUNTER — Other Ambulatory Visit
Admission: RE | Admit: 2024-01-07 | Discharge: 2024-01-07 | Disposition: A | Discharge status: Home / Self Care | Payer: Medicare (Managed Care) | Source: Ambulatory Visit

## 2024-01-07 DIAGNOSIS — J449 Chronic obstructive pulmonary disease, unspecified: Secondary | ICD-10-CM | POA: Insufficient documentation

## 2024-01-07 DIAGNOSIS — I10 Essential (primary) hypertension: Secondary | ICD-10-CM | POA: Insufficient documentation

## 2024-01-07 DIAGNOSIS — I6350 Cerebral infarction due to unspecified occlusion or stenosis of unspecified cerebral artery: Secondary | ICD-10-CM | POA: Insufficient documentation

## 2024-01-07 LAB — DIFF MANUAL
Diff Based On: 118 {cells}
React Lymph %: 2 % (ref 0–6)

## 2024-01-07 LAB — COMPREHENSIVE METABOLIC PANEL
ALT: 32 U/L (ref 0–50)
AST: 44 U/L (ref 0–50)
Albumin: 3.5 g/dL (ref 3.5–5.2)
Alk Phos: 106 U/L (ref 40–130)
Anion Gap: 17 — ABNORMAL HIGH (ref 7–16)
Bilirubin,Total: 0.5 mg/dL (ref 0.0–1.2)
CO2: 20 mmol/L (ref 20–28)
Calcium: 8.3 mg/dL — ABNORMAL LOW (ref 8.6–10.2)
Chloride: 95 mmol/L — ABNORMAL LOW (ref 96–108)
Creatinine: 1.3 mg/dL — ABNORMAL HIGH (ref 0.67–1.17)
Lab: 22 mg/dL — ABNORMAL HIGH (ref 6–20)
Potassium: 4.3 mmol/L (ref 3.3–5.1)
Sodium: 132 mmol/L — ABNORMAL LOW (ref 133–145)
Total Protein: 5.9 g/dL — ABNORMAL LOW (ref 6.3–7.7)
eGFR BY CREAT: 53 — AB

## 2024-01-07 LAB — CBC AND DIFFERENTIAL
Baso # K/uL: 0 THOU/uL (ref 0.0–0.2)
Eos # K/uL: 0.3 THOU/uL (ref 0.0–0.5)
Hematocrit: 37 % (ref 37–52)
Hemoglobin: 12.4 g/dL (ref 12.0–17.0)
Lymph # K/uL: 2.2 THOU/uL (ref 1.0–5.0)
MCV: 92 fL (ref 75–100)
Mono # K/uL: 0.9 THOU/uL (ref 0.1–1.0)
Neut # K/uL: 7.2 THOU/uL — ABNORMAL HIGH (ref 1.5–6.5)
Nucl RBC # K/uL: 0 THOU/uL
Nucl RBC %: 0 /100{WBCs} (ref 0.0–0.2)
Platelets: 186 THOU/uL (ref 150–450)
RBC: 4 MIL/uL (ref 4.0–6.0)
RDW: 13.4 % (ref 0.0–15.0)
Seg Neut %: 68.7 %
WBC: 10.4 THOU/uL (ref 3.5–11.0)

## 2024-01-07 LAB — GLUCOSE: Glucose: 94 mg/dL (ref 60–99)

## 2024-01-07 LAB — MAGNESIUM: Magnesium: 2 mg/dL (ref 1.6–2.5)

## 2024-01-08 LAB — BLOOD CULTURE
Bacterial Blood Culture: 0
Bacterial Blood Culture: 0

## 2024-01-11 ENCOUNTER — Other Ambulatory Visit
Admission: RE | Admit: 2024-01-11 | Discharge: 2024-01-11 | Disposition: A | Discharge status: Home / Self Care | Payer: Medicare (Managed Care) | Source: Ambulatory Visit

## 2024-01-11 DIAGNOSIS — I6350 Cerebral infarction due to unspecified occlusion or stenosis of unspecified cerebral artery: Secondary | ICD-10-CM | POA: Insufficient documentation

## 2024-01-11 DIAGNOSIS — E785 Hyperlipidemia, unspecified: Secondary | ICD-10-CM | POA: Insufficient documentation

## 2024-01-11 DIAGNOSIS — J449 Chronic obstructive pulmonary disease, unspecified: Secondary | ICD-10-CM | POA: Insufficient documentation

## 2024-01-11 DIAGNOSIS — I10 Essential (primary) hypertension: Secondary | ICD-10-CM | POA: Insufficient documentation

## 2024-01-11 LAB — CBC AND DIFFERENTIAL
Baso # K/uL: 0 THOU/uL (ref 0.0–0.2)
Eos # K/uL: 0.1 THOU/uL (ref 0.0–0.5)
Hematocrit: 36 % — ABNORMAL LOW (ref 37–52)
Hemoglobin: 12 g/dL (ref 12.0–17.0)
IMM Granulocytes #: 0.1 THOU/uL — ABNORMAL HIGH (ref 0–0)
IMM Granulocytes: 0.8 %
Lymph # K/uL: 1.7 THOU/uL (ref 1.0–5.0)
MCV: 93 fL (ref 75–100)
Mono # K/uL: 0.9 THOU/uL (ref 0.1–1.0)
Neut # K/uL: 4.8 THOU/uL (ref 1.5–6.5)
Nucl RBC # K/uL: 0 THOU/uL
Nucl RBC %: 0 /100{WBCs} (ref 0.0–0.2)
Platelets: 238 THOU/uL (ref 150–450)
RBC: 3.9 MIL/uL — ABNORMAL LOW (ref 4.0–6.0)
RDW: 13.2 % (ref 0.0–15.0)
Seg Neut %: 63.2 %
WBC: 7.6 THOU/uL (ref 3.5–11.0)

## 2024-01-11 LAB — BASIC METABOLIC PANEL
Anion Gap: 13 (ref 7–16)
CO2: 22 mmol/L (ref 20–28)
Calcium: 8.2 mg/dL — ABNORMAL LOW (ref 8.6–10.2)
Chloride: 97 mmol/L (ref 96–108)
Creatinine: 1.22 mg/dL — ABNORMAL HIGH (ref 0.67–1.17)
Lab: 22 mg/dL — ABNORMAL HIGH (ref 6–20)
Potassium: 4.5 mmol/L (ref 3.3–5.1)
Sodium: 132 mmol/L — ABNORMAL LOW (ref 133–145)
eGFR BY CREAT: 57 — AB

## 2024-01-11 LAB — GLUCOSE: Glucose: 97 mg/dL (ref 60–99)

## 2024-01-18 ENCOUNTER — Other Ambulatory Visit
Admission: RE | Admit: 2024-01-18 | Discharge: 2024-01-18 | Disposition: A | Discharge status: Home / Self Care | Payer: Medicare (Managed Care) | Source: Ambulatory Visit

## 2024-01-18 DIAGNOSIS — J449 Chronic obstructive pulmonary disease, unspecified: Secondary | ICD-10-CM | POA: Insufficient documentation

## 2024-01-18 DIAGNOSIS — N319 Neuromuscular dysfunction of bladder, unspecified: Secondary | ICD-10-CM | POA: Insufficient documentation

## 2024-01-18 DIAGNOSIS — I10 Essential (primary) hypertension: Secondary | ICD-10-CM | POA: Insufficient documentation

## 2024-01-18 DIAGNOSIS — E785 Hyperlipidemia, unspecified: Secondary | ICD-10-CM | POA: Insufficient documentation

## 2024-01-18 DIAGNOSIS — G039 Meningitis, unspecified: Secondary | ICD-10-CM | POA: Insufficient documentation

## 2024-01-18 LAB — CBC AND DIFFERENTIAL
Baso # K/uL: 0 THOU/uL (ref 0.0–0.2)
Eos # K/uL: 0.1 THOU/uL (ref 0.0–0.5)
Hematocrit: 39 % (ref 37–52)
Hemoglobin: 12.6 g/dL (ref 12.0–17.0)
IMM Granulocytes #: 0.1 THOU/uL — ABNORMAL HIGH (ref 0–0)
IMM Granulocytes: 1.4 %
Lymph # K/uL: 2.3 THOU/uL (ref 1.0–5.0)
MCV: 94 fL (ref 75–100)
Mono # K/uL: 0.9 THOU/uL (ref 0.1–1.0)
Neut # K/uL: 4.3 THOU/uL (ref 1.5–6.5)
Nucl RBC # K/uL: 0 THOU/uL
Nucl RBC %: 0 /100{WBCs} (ref 0.0–0.2)
Platelets: 329 THOU/uL (ref 150–450)
RBC: 4.1 MIL/uL (ref 4.0–6.0)
RDW: 13.1 % (ref 0.0–15.0)
Seg Neut %: 55.5 %
WBC: 7.8 THOU/uL (ref 3.5–11.0)

## 2024-01-18 LAB — GLUCOSE: Glucose: 87 mg/dL (ref 60–99)

## 2024-01-19 LAB — BASIC METABOLIC PANEL
Anion Gap: 15 (ref 7–16)
CO2: 22 mmol/L (ref 20–28)
Calcium: 8.9 mg/dL (ref 8.6–10.2)
Chloride: 94 mmol/L — ABNORMAL LOW (ref 96–108)
Creatinine: 1.15 mg/dL (ref 0.67–1.17)
Lab: 18 mg/dL (ref 6–20)
Potassium: 4.7 mmol/L (ref 3.3–5.1)
Sodium: 131 mmol/L — ABNORMAL LOW (ref 133–145)
eGFR BY CREAT: 62

## 2024-01-19 LAB — RESOLUTION

## 2024-02-01 ENCOUNTER — Other Ambulatory Visit
Admission: RE | Admit: 2024-02-01 | Discharge: 2024-02-01 | Disposition: A | Discharge status: Home / Self Care | Payer: Medicare (Managed Care) | Source: Ambulatory Visit

## 2024-02-01 DIAGNOSIS — R627 Adult failure to thrive: Secondary | ICD-10-CM | POA: Insufficient documentation

## 2024-02-01 DIAGNOSIS — I10 Essential (primary) hypertension: Secondary | ICD-10-CM | POA: Insufficient documentation

## 2024-02-01 LAB — CBC AND DIFFERENTIAL
Baso # K/uL: 0.1 THOU/uL (ref 0.0–0.2)
Eos # K/uL: 0.1 THOU/uL (ref 0.0–0.5)
Hematocrit: 39 % (ref 37–52)
Hemoglobin: 12.8 g/dL (ref 12.0–17.0)
IMM Granulocytes #: 0.1 THOU/uL — ABNORMAL HIGH (ref 0–0)
IMM Granulocytes: 1.2 %
Lymph # K/uL: 2.7 THOU/uL (ref 1.0–5.0)
MCV: 93 fL (ref 75–100)
Mono # K/uL: 1 THOU/uL (ref 0.1–1.0)
Neut # K/uL: 4.4 THOU/uL (ref 1.5–6.5)
Nucl RBC # K/uL: 0 THOU/uL
Nucl RBC %: 0 /100{WBCs} (ref 0.0–0.2)
Platelets: 217 THOU/uL (ref 150–450)
RBC: 4.2 MIL/uL (ref 4.0–6.0)
RDW: 13.4 % (ref 0.0–15.0)
Seg Neut %: 52.9 %
WBC: 8.4 THOU/uL (ref 3.5–11.0)

## 2024-02-01 LAB — BASIC METABOLIC PANEL
Anion Gap: 17 — ABNORMAL HIGH (ref 7–16)
CO2: 22 mmol/L (ref 20–28)
Calcium: 8.9 mg/dL (ref 8.6–10.2)
Chloride: 95 mmol/L — ABNORMAL LOW (ref 96–108)
Creatinine: 1.1 mg/dL (ref 0.67–1.17)
Lab: 21 mg/dL — ABNORMAL HIGH (ref 6–20)
Potassium: 4.3 mmol/L (ref 3.3–5.1)
Sodium: 134 mmol/L (ref 133–145)
eGFR BY CREAT: 65

## 2024-02-01 LAB — GLUCOSE: Glucose: 72 mg/dL (ref 60–99)

## 2024-02-08 ENCOUNTER — Other Ambulatory Visit
Admission: RE | Admit: 2024-02-08 | Discharge: 2024-02-08 | Disposition: A | Discharge status: Home / Self Care | Payer: Medicare (Managed Care) | Source: Ambulatory Visit

## 2024-02-08 DIAGNOSIS — N390 Urinary tract infection, site not specified: Secondary | ICD-10-CM | POA: Insufficient documentation

## 2024-02-08 LAB — URINALYSIS WITH REFLEX TO CULTURE
Glucose,UA: NEGATIVE
Ketones, UA: NEGATIVE
Nitrite,UA: POSITIVE — AB
Specific Gravity,UA: 1.014 (ref 1.002–1.030)
pH,UA: 8 (ref 5.0–8.0)

## 2024-02-08 LAB — URINE MICROSCOPIC (IQ200)
Squam Epithel,UA: NONE SEEN
WBC,UA: >50 /HPF — AB (ref 0–5)

## 2024-02-10 LAB — AEROBIC BACTERIAL URINE CULTURE: Aerobic bacterial urine culture: 0

## 2024-03-09 ENCOUNTER — Emergency Department: Admission: EM | Admit: 2024-03-09 | Payer: Medicare (Managed Care) | Source: Ambulatory Visit

## 2024-03-09 ENCOUNTER — Other Ambulatory Visit: Payer: Self-pay

## 2024-03-09 LAB — CBC AND DIFFERENTIAL
Baso # K/uL: 0 10*3/uL (ref 0.0–0.2)
Eos # K/uL: 0 10*3/uL (ref 0.0–0.5)
Hematocrit: 37 % (ref 37–52)
Hemoglobin: 12.1 g/dL (ref 12.0–17.0)
IMM Granulocytes #: 0.1 10*3/uL — ABNORMAL HIGH (ref 0–0)
IMM Granulocytes: 0.5 %
Lymph # K/uL: 1 10*3/uL (ref 1.0–5.0)
MCV: 91 fL (ref 75–100)
Mono # K/uL: 1.1 10*3/uL — ABNORMAL HIGH (ref 0.1–1.0)
Neut # K/uL: 7 10*3/uL — ABNORMAL HIGH (ref 1.5–6.5)
Nucl RBC # K/uL: 0 10*3/uL
Nucl RBC %: 0 /100{WBCs} (ref 0.0–0.2)
Platelets: 148 10*3/uL — ABNORMAL LOW (ref 150–450)
RBC: 4.1 MIL/uL (ref 4.0–6.0)
RDW: 13.5 % (ref 0.0–15.0)
Seg Neut %: 76.5 %
WBC: 9.2 10*3/uL (ref 3.5–11.0)

## 2024-03-09 LAB — COMPREHENSIVE METABOLIC PANEL, PL
ALT, PL: 38 U/L (ref 0–50)
AST, PL: 23 U/L (ref 0–50)
Albumin, PL: 3.9 g/dL (ref 3.5–5.2)
Alk Phos, PL: 98 U/L (ref 40–130)
Anion Gap,PL: 11 (ref 7–16)
Bilirubin Total, PL: 0.3 mg/dL (ref 0.0–1.2)
CO2,Plasma: 26 mmol/L (ref 20–28)
Calcium, PL: 9 mg/dL (ref 8.6–10.2)
Chloride,Plasma: 92 mmol/L — ABNORMAL LOW (ref 96–108)
Creatinine: 1.08 mg/dL (ref 0.67–1.17)
Glucose,Plasma: 116 mg/dL — ABNORMAL HIGH (ref 60–99)
Potassium,Plasma: 4.8 mmol/L — ABNORMAL HIGH (ref 3.3–4.6)
Sodium,Plasma: 129 mmol/L — ABNORMAL LOW (ref 133–145)
Total Protein, PL: 7.2 g/dL (ref 6.3–7.7)
UN,Plasma: 26 mg/dL — ABNORMAL HIGH (ref 6–20)
eGFR BY CREAT: 67

## 2024-03-09 LAB — URINALYSIS WITH REFLEX TO MICROSCOPIC
Glucose,UA: NEGATIVE
Ketones, UA: NEGATIVE
Nitrite,UA: POSITIVE — AB
Specific Gravity,UA: 1.01 (ref 1.002–1.030)
pH,UA: 8.5 — ABNORMAL HIGH (ref 5.0–8.0)

## 2024-03-09 LAB — URINE MICROSCOPIC (IQ200)

## 2024-03-09 MED ORDER — LACTATED RINGERS IV BOLUS *I*
1000.0000 mL | Freq: Once | INTRAVENOUS | Status: AC
  Administered 2024-03-09: 1000 mL via INTRAVENOUS

## 2024-03-09 NOTE — ED Notes (Signed)
 Received report from Newell JONELLE PEAK

## 2024-03-09 NOTE — ED Triage Notes (Addendum)
 BIBA from [facility]. Hot cold flashes and shaky with memory loss per patient, staff say patient is at his baseline. Pt daughter would like patient to be seen because she believes that he has a urinary tract infection, pt believes he has a UTI due to similar symptoms back in 1996. Urinary catheter in place. HOH and blind.   Prehospital medications given: No

## 2024-03-09 NOTE — ED Provider Notes (Shared)
 History No chief complaint on file.HPI83 year old male with a past medical history BPH, neurogenic bladder status post suprapubic catheter placement, with chronic suprapubic catheter, dysphagia, prior UTI, here with concern for shaking chills, increased confusion, patient/family concerned for UTI, he has a suprapubic tube, had a routine change 2-3 days ago, denies pain/hematuria, no fevers. No nausea or vomiting, does have a cough.Was admitted in December for weakness and FTT, treated for UTI. Medical/Surgical/Family History Past Medical History[1] Active Patient Problem List[2] Past Surgical History[3] Social History[4]  Review of SystemsPhysical Exam Triage VitalsTriage Start: Start, (03/09/24 2158)  First Recorded BP: 141/71, Resp: 16, Temp: 36.2 C (97.2 F), Temp src: Infrared Oxygen Therapy SpO2: 97 %, Oximetry Source: Rt Hand, O2 Device: None (Room air), Heart Rate: 70, (03/09/24 2207)  .Physical ExamMedical Decision Making    Sharyne Blank, MD  [1] Past Medical History:Diagnosis Date  Asthma   Cerebral artery occlusion with cerebral infarction   COPD (chronic obstructive pulmonary disease)   COPD (chronic obstructive pulmonary disease)   Hyperlipidemia   Hypertension   Meningitis   Testicle cancer  [2] Patient Active Problem ListDiagnosis Code  Benign Prostatic Hyperplasia 600  BPH (benign prostatic hyperplasia) N40.0  Elevated PSA R97.20  Melanoma C43.9  Neurogenic bladder N31.9  Dysphagia, unspecified type R13.10  FTT (failure to thrive) in adult R62.7 [3] Past Surgical History:Procedure Laterality Date  APPENDECTOMY    COCHLEAR IMPLANT    left ear  EYE SURGERY    PROSTATE SURGERY    superpubic cath    TESTICLE REMOVAL    TONSILLECTOMY AND ADENOIDECTOMY   [4] Social HistoryTobacco Use  Smoking status: Former   Current packs/day: 0.00    Average packs/day: 1 pack/day for 10.0 years (10.0 ttl pk-yrs)   Types: Cigarettes   Start date: 11/18/1955   Quit date: 11/19/1965   Years since quitting: 58.3  Smokeless tobacco: Never  Tobacco comments:   The data in the grid above may be an average based on historical data and used for Lung Cancer Screening Eligibility.  If you find it inaccurate you can delete it and take a more accurate history. Substance Use Topics  Alcohol use: Yes   Alcohol/week: 1.0 standard drink of alcohol   Types: 1 Cans of beer per week  Drug use: Never

## 2024-03-14 ENCOUNTER — Ambulatory Visit: Payer: Medicare (Managed Care) | Admitting: Urology
